# Patient Record
Sex: Male | Born: 1955
Health system: Southern US, Community
[De-identification: ages and names within clinical notes are randomized; demographics above are authoritative.]

## PROBLEM LIST (undated history)

## (undated) DIAGNOSIS — I2510 Atherosclerotic heart disease of native coronary artery without angina pectoris: Secondary | ICD-10-CM

## (undated) DIAGNOSIS — I10 Essential (primary) hypertension: Secondary | ICD-10-CM

## (undated) DIAGNOSIS — I252 Old myocardial infarction: Secondary | ICD-10-CM

## (undated) DIAGNOSIS — E119 Type 2 diabetes mellitus without complications: Secondary | ICD-10-CM

## (undated) DIAGNOSIS — F32A Depression, unspecified: Secondary | ICD-10-CM

## (undated) DIAGNOSIS — I219 Acute myocardial infarction, unspecified: Secondary | ICD-10-CM

## (undated) DIAGNOSIS — F1020 Alcohol dependence, uncomplicated: Secondary | ICD-10-CM

## (undated) DIAGNOSIS — F419 Anxiety disorder, unspecified: Secondary | ICD-10-CM

## (undated) DIAGNOSIS — M1990 Unspecified osteoarthritis, unspecified site: Secondary | ICD-10-CM

## (undated) DIAGNOSIS — G4700 Insomnia, unspecified: Secondary | ICD-10-CM

## (undated) DIAGNOSIS — F329 Major depressive disorder, single episode, unspecified: Secondary | ICD-10-CM

## (undated) DIAGNOSIS — E785 Hyperlipidemia, unspecified: Secondary | ICD-10-CM

## (undated) DIAGNOSIS — Q240 Dextrocardia: Secondary | ICD-10-CM

## (undated) DIAGNOSIS — M5126 Other intervertebral disc displacement, lumbar region: Secondary | ICD-10-CM

## (undated) HISTORY — DX: Type 2 diabetes mellitus without complications: E11.9

## (undated) HISTORY — DX: Anxiety disorder, unspecified: F41.9

## (undated) HISTORY — DX: Acute myocardial infarction, unspecified: I21.9

## (undated) HISTORY — DX: Depression, unspecified: F32.A

## (undated) HISTORY — DX: Atherosclerotic heart disease of native coronary artery without angina pectoris: I25.10

## (undated) HISTORY — DX: Major depressive disorder, single episode, unspecified: F32.9

## (undated) HISTORY — DX: Insomnia, unspecified: G47.00

## (undated) HISTORY — DX: Unspecified osteoarthritis, unspecified site: M19.90

## (undated) HISTORY — DX: Old myocardial infarction: I25.2

## (undated) HISTORY — DX: Alcohol dependence, uncomplicated: F10.20

## (undated) HISTORY — DX: Dextrocardia: Q24.0

## (undated) HISTORY — DX: Essential (primary) hypertension: I10

## (undated) HISTORY — DX: Hyperlipidemia, unspecified: E78.5

---
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
## 2002-11-28 DIAGNOSIS — I252 Old myocardial infarction: Secondary | ICD-10-CM

## 2002-11-28 DIAGNOSIS — I219 Acute myocardial infarction, unspecified: Secondary | ICD-10-CM

## 2002-11-28 DIAGNOSIS — I2510 Atherosclerotic heart disease of native coronary artery without angina pectoris: Secondary | ICD-10-CM

## 2002-11-28 HISTORY — DX: Atherosclerotic heart disease of native coronary artery without angina pectoris: I25.10

## 2002-11-28 HISTORY — DX: Acute myocardial infarction, unspecified: I21.9

## 2002-11-28 HISTORY — DX: Old myocardial infarction: I25.2

## 2002-12-13 ENCOUNTER — Encounter: Payer: Self-pay | Admitting: Emergency Medicine

## 2002-12-13 ENCOUNTER — Inpatient Hospital Stay (HOSPITAL_COMMUNITY): Admission: EM | Admit: 2002-12-13 | Discharge: 2002-12-17 | Payer: Self-pay | Admitting: Cardiology

## 2002-12-16 ENCOUNTER — Encounter: Payer: Self-pay | Admitting: Cardiovascular Disease

## 2005-04-15 ENCOUNTER — Ambulatory Visit: Payer: Self-pay | Admitting: *Deleted

## 2005-04-27 ENCOUNTER — Encounter (HOSPITAL_COMMUNITY): Admission: RE | Admit: 2005-04-27 | Discharge: 2005-05-27 | Payer: Self-pay | Admitting: *Deleted

## 2005-04-27 ENCOUNTER — Ambulatory Visit: Payer: Self-pay | Admitting: *Deleted

## 2005-05-05 ENCOUNTER — Ambulatory Visit: Payer: Self-pay | Admitting: *Deleted

## 2006-11-24 ENCOUNTER — Emergency Department (HOSPITAL_COMMUNITY): Admission: EM | Admit: 2006-11-24 | Discharge: 2006-11-24 | Payer: Self-pay | Admitting: Emergency Medicine

## 2006-11-28 HISTORY — PX: CORONARY ANGIOPLASTY WITH STENT PLACEMENT: SHX49

## 2007-09-17 ENCOUNTER — Inpatient Hospital Stay (HOSPITAL_COMMUNITY): Admission: EM | Admit: 2007-09-17 | Discharge: 2007-09-19 | Payer: Self-pay | Admitting: Emergency Medicine

## 2007-09-17 ENCOUNTER — Ambulatory Visit: Payer: Self-pay | Admitting: Cardiology

## 2007-10-10 ENCOUNTER — Ambulatory Visit: Payer: Self-pay | Admitting: Cardiology

## 2007-10-10 ENCOUNTER — Inpatient Hospital Stay (HOSPITAL_COMMUNITY): Admission: EM | Admit: 2007-10-10 | Discharge: 2007-10-12 | Payer: Self-pay | Admitting: *Deleted

## 2007-10-17 ENCOUNTER — Ambulatory Visit: Payer: Self-pay | Admitting: Cardiology

## 2007-11-09 ENCOUNTER — Ambulatory Visit: Payer: Self-pay | Admitting: Cardiology

## 2008-01-31 ENCOUNTER — Ambulatory Visit: Payer: Self-pay | Admitting: Cardiology

## 2008-04-16 ENCOUNTER — Emergency Department (HOSPITAL_COMMUNITY): Admission: EM | Admit: 2008-04-16 | Discharge: 2008-04-16 | Payer: Self-pay | Admitting: Emergency Medicine

## 2008-05-06 ENCOUNTER — Ambulatory Visit: Payer: Self-pay | Admitting: Cardiology

## 2008-06-03 ENCOUNTER — Ambulatory Visit: Payer: Self-pay | Admitting: Cardiology

## 2008-10-07 ENCOUNTER — Ambulatory Visit: Payer: Self-pay | Admitting: Cardiology

## 2009-04-07 ENCOUNTER — Ambulatory Visit: Payer: Self-pay | Admitting: Cardiology

## 2009-06-17 DIAGNOSIS — I10 Essential (primary) hypertension: Secondary | ICD-10-CM | POA: Insufficient documentation

## 2009-06-17 DIAGNOSIS — E119 Type 2 diabetes mellitus without complications: Secondary | ICD-10-CM | POA: Insufficient documentation

## 2009-06-17 DIAGNOSIS — F411 Generalized anxiety disorder: Secondary | ICD-10-CM | POA: Insufficient documentation

## 2009-06-17 DIAGNOSIS — I2510 Atherosclerotic heart disease of native coronary artery without angina pectoris: Secondary | ICD-10-CM | POA: Insufficient documentation

## 2009-06-17 DIAGNOSIS — Q248 Other specified congenital malformations of heart: Secondary | ICD-10-CM | POA: Insufficient documentation

## 2009-06-25 ENCOUNTER — Ambulatory Visit: Payer: Self-pay | Admitting: Cardiology

## 2009-10-08 ENCOUNTER — Encounter (INDEPENDENT_AMBULATORY_CARE_PROVIDER_SITE_OTHER): Payer: Self-pay | Admitting: *Deleted

## 2009-10-08 ENCOUNTER — Ambulatory Visit: Payer: Self-pay | Admitting: Cardiology

## 2009-10-08 ENCOUNTER — Encounter: Payer: Self-pay | Admitting: Cardiology

## 2009-10-08 LAB — CONVERTED CEMR LAB
Hemoglobin, Urine: NEGATIVE
Nitrite: NEGATIVE
Platelets: 209 10*3/uL
Triglycerides: 118 mg/dL
Urine Glucose: NEGATIVE mg/dL
WBC: 5.4 10*3/uL

## 2009-10-16 ENCOUNTER — Telehealth (INDEPENDENT_AMBULATORY_CARE_PROVIDER_SITE_OTHER): Payer: Self-pay

## 2009-10-19 ENCOUNTER — Telehealth: Payer: Self-pay | Admitting: Cardiology

## 2009-10-30 ENCOUNTER — Encounter (INDEPENDENT_AMBULATORY_CARE_PROVIDER_SITE_OTHER): Payer: Self-pay | Admitting: *Deleted

## 2009-11-02 ENCOUNTER — Telehealth: Payer: Self-pay | Admitting: Cardiology

## 2009-12-07 ENCOUNTER — Encounter (INDEPENDENT_AMBULATORY_CARE_PROVIDER_SITE_OTHER): Payer: Self-pay | Admitting: *Deleted

## 2009-12-07 LAB — CONVERTED CEMR LAB
AST: 15 units/L
Albumin: 4.5 g/dL
Alkaline Phosphatase: 40 units/L
Basophils Absolute: 0 10*3/uL
Basophils Relative: 0 %
Calcium: 9.5 mg/dL
Chloride: 104 meq/L
Cholesterol: 126 mg/dL
Glucose, Bld: 122 mg/dL
HDL: 42 mg/dL
LDL Cholesterol: 59 mg/dL
Lymphocytes Relative: 23 %
Lymphs Abs: 1.4 10*3/uL
MCV: 94.2 fL
Potassium: 4.9 meq/L
RBC: 4.99 M/uL
RDW: 12.3 %
TSH: 2.364 microintl units/mL
Triglycerides: 126 mg/dL

## 2010-02-23 ENCOUNTER — Encounter (INDEPENDENT_AMBULATORY_CARE_PROVIDER_SITE_OTHER): Payer: Self-pay | Admitting: *Deleted

## 2010-07-27 ENCOUNTER — Encounter (INDEPENDENT_AMBULATORY_CARE_PROVIDER_SITE_OTHER): Payer: Self-pay | Admitting: *Deleted

## 2010-07-29 ENCOUNTER — Encounter (INDEPENDENT_AMBULATORY_CARE_PROVIDER_SITE_OTHER): Payer: Self-pay | Admitting: *Deleted

## 2010-11-08 ENCOUNTER — Ambulatory Visit: Payer: Self-pay | Admitting: Cardiology

## 2010-11-08 ENCOUNTER — Encounter (INDEPENDENT_AMBULATORY_CARE_PROVIDER_SITE_OTHER): Payer: Self-pay | Admitting: *Deleted

## 2010-11-08 DIAGNOSIS — E7800 Pure hypercholesterolemia, unspecified: Secondary | ICD-10-CM | POA: Insufficient documentation

## 2010-11-09 ENCOUNTER — Encounter: Payer: Self-pay | Admitting: Adult Health

## 2010-11-10 ENCOUNTER — Telehealth (INDEPENDENT_AMBULATORY_CARE_PROVIDER_SITE_OTHER): Payer: Self-pay | Admitting: *Deleted

## 2010-11-17 ENCOUNTER — Encounter (HOSPITAL_COMMUNITY)
Admission: RE | Admit: 2010-11-17 | Discharge: 2010-12-17 | Payer: Self-pay | Source: Home / Self Care | Attending: Cardiology | Admitting: Cardiology

## 2010-11-17 ENCOUNTER — Ambulatory Visit: Payer: Self-pay | Admitting: Cardiology

## 2010-11-23 ENCOUNTER — Ambulatory Visit: Payer: Self-pay | Admitting: Cardiology

## 2010-12-28 NOTE — Letter (Signed)
Summary: Appointment - Missed  [facility]  [address], [place] [address]   Phone: [phone]  Fax: [phone]     July 29, 2010 MRN: [id number]   [name] [address] [place], [place]  [address]   Dear [name],  Our records indicate you missed your appointment on           07/29/10             with Dr.       .                ROTHBART                    It is very important that we reach you to reschedule this appointment. We look forward to participating in your health care needs. Please contact us at the number listed above at your earliest convenience to reschedule this appointment.     Sincerely,    Glass blower/designer

## 2010-12-28 NOTE — Miscellaneous (Signed)
  Clinical Lists Changes  Observations: Added new observation of RS STUDY: TRACER - study completion 01/12/10 (02/23/2010 11:23)      Research Study Name: TRACER - study completion 01/12/10

## 2010-12-28 NOTE — Miscellaneous (Signed)
Summary: cbc,lipids  Clinical Lists Changes  Observations: Added new observation of WBC UR: neg (10/08/2009 10:59) Added new observation of NITRITE URN: neg (10/08/2009 10:59) Added new observation of PROTEIN UR: neg (10/08/2009 10:59) Added new observation of BLOOD UR: neg (10/08/2009 10:59) Added new observation of GLUCOSE UA: neg (10/08/2009 10:59) Added new observation of PH URINE: 6.0  (10/08/2009 10:59) Added new observation of SPEC GR URIN: 1.015  (10/08/2009 10:59) Added new observation of LDL: 56 mg/dL (16/08/9603 54:09) Added new observation of HDL: 54 mg/dL (81/19/1478 29:56) Added new observation of TRIGLYC TOT: 118 mg/dL (21/30/8657 84:69) Added new observation of CHOLESTEROL: 134 mg/dL (62/95/2841 32:44) Added new observation of PLATELETK/UL: 209 K/uL (10/08/2009 10:59) Added new observation of HCT: 50 % (10/08/2009 10:59) Added new observation of HGB: 16.9 g/dL (11/30/7251 66:44) Added new observation of WBC COUNT: 5.4 10*3/microliter (10/08/2009 10:59)

## 2010-12-28 NOTE — Miscellaneous (Signed)
Summary: LABS CBCD,CMP,LIPIDS,TSH,A1C,12/07/2009  Clinical Lists Changes  Observations: Added new observation of CALCIUM: 9.5 mg/dL (16/08/9603 54:09) Added new observation of ALBUMIN: 4.5 g/dL (81/19/1478 29:56) Added new observation of PROTEIN, TOT: 7.2 g/dL (21/30/8657 84:69) Added new observation of SGPT (ALT): 19 units/L (12/07/2009 16:19) Added new observation of SGOT (AST): 15 units/L (12/07/2009 16:19) Added new observation of ALK PHOS: 40 units/L (12/07/2009 16:19) Added new observation of CREATININE: 0.96 mg/dL (62/95/2841 32:44) Added new observation of BUN: 16 mg/dL (11/30/7251 66:44) Added new observation of BG RANDOM: 122 mg/dL (03/47/4259 56:38) Added new observation of CO2 PLSM/SER: 23 meq/L (12/07/2009 16:19) Added new observation of CL SERUM: 104 meq/L (12/07/2009 16:19) Added new observation of K SERUM: 4.9 meq/L (12/07/2009 16:19) Added new observation of NA: 140 meq/L (12/07/2009 16:19) Added new observation of LDL: 59 mg/dL (75/64/3329 51:88) Added new observation of HDL: 42 mg/dL (41/66/0630 16:01) Added new observation of TRIGLYC TOT: 126 mg/dL (09/32/3557 32:20) Added new observation of CHOLESTEROL: 126 mg/dL (25/42/7062 37:62) Added new observation of TSH: 2.364 microintl units/mL (12/07/2009 16:19) Added new observation of HGBA1C: 6.2 % (12/07/2009 16:19) Added new observation of ABSOLUTE BAS: 0.0 K/uL (12/07/2009 16:19) Added new observation of BASOPHIL %: 0 % (12/07/2009 16:19) Added new observation of EOS ABSLT: 0.1 K/uL (12/07/2009 16:19) Added new observation of % EOS AUTO: 1 % (12/07/2009 16:19) Added new observation of ABSOLUTE MON: 0.6 K/uL (12/07/2009 16:19) Added new observation of MONOCYTE %: 10 % (12/07/2009 16:19) Added new observation of ABS LYMPHOCY: 1.4 K/uL (12/07/2009 16:19) Added new observation of LYMPHS %: 23 % (12/07/2009 16:19) Added new observation of PLATELETK/UL: 219 K/uL (12/07/2009 16:19) Added new observation of RDW: 12.3 %  (12/07/2009 16:19) Added new observation of MCHC RBC: 33.6 g/dL (83/15/1761 60:73) Added new observation of MCV: 94.2 fL (12/07/2009 16:19) Added new observation of HCT: 47.0 % (12/07/2009 16:19) Added new observation of HGB: 15.8 g/dL (71/04/2693 85:46) Added new observation of RBC M/UL: 4.99 M/uL (12/07/2009 16:19) Added new observation of WBC COUNT: 6.0 10*3/microliter (12/07/2009 16:19)

## 2010-12-29 ENCOUNTER — Encounter (INDEPENDENT_AMBULATORY_CARE_PROVIDER_SITE_OTHER): Payer: Self-pay | Admitting: *Deleted

## 2010-12-30 NOTE — Progress Notes (Signed)
Summary: PT NEEDS NEW RXS CALLED IN IF POSSIBLE  Phone Note Call from Patient Call back at [phone]   Caller: PT Reason for Call: Refill Medication, Talk to Nurse Summary of Call: PT CAN NOT AFFORD CEALIS AND WOULD LIKE LAVETRA 20MG  INSTEAD. PLUS HE IS ON PRAVASTATIN 80MG  AND WAS TOLD IF WE CALLED IT IN FOR 40MG  WITH TWICE THE PILLS IT WOULD BE CHEAPER ALSO. HE USES [facility] IN [place]. Initial call taken by: Faythe Ghee,  November 10, 2010 10:14 AM  Follow-up for Phone Call        We can call in the pravachol as it works best for him. Ok to write for one and ONLY one Rx for levitra. He will need to seek urologist for more refills. Follow-up by: Joni Reining NP    New/Updated Medications: PRAVASTATIN SODIUM 40 MG TABS (PRAVASTATIN SODIUM) Take two  tablets  by mouth daily at bedtime LEVITRA 10 MG TABS (VARDENAFIL HCL) take 1 tablet prior to sexual activity Prescriptions: LEVITRA 10 MG TABS (VARDENAFIL HCL) take 1 tablet prior to sexual activity  #3 x 0   Entered by:   Teressa Lower RN   Authorized by:   Joni Reining, NP   Signed by:   Teressa Lower RN on 11/12/2010   Method used:   Electronically to        [facility]* (retail)       [address] [place], [place]  [address]       Ph: [phone] or [phone]       Fax: [phone]   RxID:   [id number] PRAVASTATIN SODIUM 40 MG TABS (PRAVASTATIN SODIUM) Take two  tablets  by mouth daily at bedtime  #60 x 3   Entered by:   Teressa Lower RN   Authorized by:   Joni Reining, NP   Signed by:   Teressa Lower RN on 11/12/2010   Method used:   Electronically to        [facility]* (retail)       [address] [place], [place]  [address]       Ph: [phone] or [phone]       Fax: [phone]   RxID:   [id number]

## 2010-12-30 NOTE — Assessment & Plan Note (Signed)
Summary: ROV POST STRESS TEST   Visit Type:  Follow-up Primary Provider:  rchd  CC:  no cardiology complaints.  History of Present Illness: [name] is a 55 y/o CM we are following for continued assessment and treatment of CAD and other cardiovascular risk factors. He was scheduled for stress myoview secondary to complaints of increasing DOE. Since he has history of LAD and RCA stents he was reassesssed especially with ongoing tobacco abuse.  He denies any chest discomfort.  He was advised to stop lipitor on last visit for one week to assess if his myalgias were being caused by this.  He is anxious about his ED and was started on Viagra by Dr. Dietrich Pates. He wishes to try others as this medication has not been helpful to him.  Current Medications (verified): 1)  Glipizide 10 Mg Xr24h-Tab (Glipizide) .... Take 1 Tab Two Times A Day 2)  Aspirin 325 Mg Tabs (Aspirin) .... Take 1 Tab Daily 3)  Plavix 75 Mg Tabs (Clopidogrel Bisulfate) .... Take 1 Tab Daily 4)  Metformin Hcl 1000 Mg Tabs (Metformin Hcl) .... Take 1tab Two Times A Day 5)  Accupril 20 Mg Tabs (Quinapril Hcl) .... Take 1 Tab Two Times A Day 6)  Pravastatin Sodium 40 Mg Tabs (Pravastatin Sodium) .... Take Two  Tablets  By Mouth Daily At Bedtime 7)  Toprol Xl 50 Mg Xr24h-Tab (Metoprolol Succinate) .... Take 1 Tab Daily 8)  Prozac 40 Mg Caps (Fluoxetine Hcl) .... Take 1 Tab Daily 9)  Xanax 1 Mg Tabs (Alprazolam) .... Take 1 Tab Three Times A Day 10)  Nitrostat 0.4 Mg Subl (Nitroglycerin) .Marland Kitchen.. 1 Tablet Under Tongue At Onset of Chest Pain; You May Repeat Every 5 Minutes For Up To 3 Doses. 11)  Levitra 10 Mg Tabs (Vardenafil Hcl) .... Take 1 Tablet Prior To Sexual Activity  Allergies (verified): 1)  ! Penicillin  Comments:  Nurse/Medical Assistant: [facility] [place] hasn't got pravastatin due to he states it wasn't called in.  Review of Systems       All other systems have been reviewed and are negative  unless stated above.   Vital Signs:  Patient profile:   55 year old male Weight:      202 pounds O2 Sat:      90 % on Room air Pulse rate:   56 / minute BP sitting:   122 / 71  (right arm)  Vitals Entered By: Dreama Saa, CNA (November 23, 2010 1:11 PM)  O2 Flow:  Room air  Physical Exam  General:  Well developed, well nourished, in no acute distress. Lungs:  Clear bilaterally to auscultation and percussion. Heart:  Non-displaced PMI, chest non-tender; regular rate and rhythm, S1, S2 without murmurs, rubs or gallops. Carotid upstroke normal, no bruit. pedals normal pulses. No edema, no varicosities. Abdomen:  Obese 2+ bowel sounds. Msk:  Back normal, normal gait. Muscle strength and tone normal. Pulses:  pulses normal in all 4 extremities Extremities:  No clubbing or cyanosis. Neurologic:  Alert and oriented x 3. Psych:  Normal affect.   Impression & Recommendations:  Problem # 1:  CAD (ICD-414.00) Review of stress test demonstrates abnormal exercise myoview revealing somewhat impaired exercise capacity. No significant stress induced EKG abnormalities, and mild left ventricular dilitation and dysfunction in segmental pattern.  Some periinfart ischemia.  Poor exercise tolerance.  He will be continued to be managed medically.  I have advised smoking cessation. His updated medication list for this  problem includes:    Aspirin 325 Mg Tabs (Aspirin) .Marland Kitchen... Take 1 tab daily    Plavix 75 Mg Tabs (Clopidogrel bisulfate) .Marland Kitchen... Take 1 tab daily    Accupril 20 Mg Tabs (Quinapril hcl) .Marland Kitchen... Take 1 tab two times a day    Toprol Xl 50 Mg Xr24h-tab (Metoprolol succinate) .Marland Kitchen... Take 1 tab daily    Nitrostat 0.4 Mg Subl (Nitroglycerin) .Marland Kitchen... 1 tablet under tongue at onset of chest pain; you may repeat every 5 minutes for up to 3 doses.  Problem # 2:  HYPERCHOLESTEROLEMIA (ICD-272.0) Will have him start pravachol to assess for his tolerance to this.  We will follow-up in 3 months for repeat  cholesterol levels,  He is to call if he is unable to tolerate meds. His updated medication list for this problem includes:    Pravastatin Sodium 40 Mg Tabs (Pravastatin sodium) .Marland Kitchen... Take two  tablets  by mouth daily at bedtime  Problem # 3:  HYPERTENSION (ICD-401.9) Well controlled. His updated medication list for this problem includes:    Aspirin 325 Mg Tabs (Aspirin) .Marland Kitchen... Take 1 tab daily    Accupril 20 Mg Tabs (Quinapril hcl) .Marland Kitchen... Take 1 tab two times a day    Toprol Xl 50 Mg Xr24h-tab (Metoprolol succinate) .Marland Kitchen... Take 1 tab daily  Other Orders: Misc. Referral (Misc. Ref)  Patient Instructions: 1)  Your physician recommends that you schedule a follow-up appointment in: 6 MONTHS 2)  You have been referred to UROLOGIST FOR ERECTILE DYSFUNCTION [phone] Prescriptions: LEVITRA 10 MG TABS (VARDENAFIL HCL) take 1 tablet prior to sexual activity  #3 x 0   Entered by:   Teressa Lower RN   Authorized by:   Joni Reining, NP   Signed by:   Teressa Lower RN on 11/23/2010   Method used:   Electronically to        [facility] (retail)       [address]       [place], [place]  [address]       Ph: [phone]       Fax: [phone]   RxID:   [id number] PRAVASTATIN SODIUM 40 MG TABS (PRAVASTATIN SODIUM) Take two  tablets  by mouth daily at bedtime  #60 x 3   Entered by:   Teressa Lower RN   Authorized by:   Joni Reining, NP   Signed by:   Teressa Lower RN on 11/23/2010   Method used:   Electronically to        [facility]* (retail)       [address]       [place], [place]  [address]       Ph: [phone]       Fax: [phone]   RxID:   [id number]

## 2010-12-30 NOTE — Assessment & Plan Note (Signed)
Summary: F1Y   Visit Type:  Follow-up Primary Provider:  rchd  CC:  some chest pain and sob occasionally.  History of Present Illness: Mr. Fassnacht is a 55 y/o CM with known history ofCAD, s/p cath demonstrating 11/08 continued patency of both the LAD (left anterior descending) and  RCA (right coronary artery) stents. Distal 50% stenosis that does not appear to be critical, hypertension, hypercholesterolemia and diabetes.  He is without complaint of chest pain, but is having more DOE and fatigue. He states that he is becoming intolerant of the liptior causing him to have muscle aches and pain.  He was changed from simvistatin to this a year ago.  He continues issues with ED and states that Viagra is not as effective as cialis, although he has been on viagra for months. He has a history of dextracardia.  Current Medications (verified): 1)  Glipizide 10 Mg Xr24h-Tab (Glipizide) .... Take 1 Tab Two Times A Day 2)  Aspirin 325 Mg Tabs (Aspirin) .... Take 1 Tab Daily 3)  Plavix 75 Mg Tabs (Clopidogrel Bisulfate) .... Take 1 Tab Daily 4)  Metformin Hcl 1000 Mg Tabs (Metformin Hcl) .... Take 1tab Two Times A Day 5)  Accupril 20 Mg Tabs (Quinapril Hcl) .... Take 1 Tab Two Times A Day 6)  Pravastatin Sodium 80 Mg Tabs (Pravastatin Sodium) .... Take One Tablet By Mouth Daily At Bedtime 7)  Toprol Xl 50 Mg Xr24h-Tab (Metoprolol Succinate) .... Take 1 Tab Daily 8)  Prozac 40 Mg Caps (Fluoxetine Hcl) .... Take 1 Tab Daily 9)  Xanax 1 Mg Tabs (Alprazolam) .... Take 1 Tab Three Times A Day 10)  Nitrostat 0.4 Mg Subl (Nitroglycerin) .Marland Kitchen.. 1 Tablet Under Tongue At Onset of Chest Pain; You May Repeat Every 5 Minutes For Up To 3 Doses. 11)  Cialis 10 Mg Tabs (Tadalafil) .... Take 1 Tablet 30 Minutes Prior To Sexual Activity  Allergies (verified): 1)  ! Penicillin  Comments:  Nurse/Medical Assistant: patient and i reviewed meds from ov in 2010 stated these meds are correct [facility] [place]  Past  History:  Past medical, surgical, family and social histories (including risk factors) reviewed, and no changes noted (except as noted below).  Past Medical History: Reviewed history from 06/17/2009 and no changes required. Current Problems:  ANXIETY DISORDER (ICD-300.00) CAD (ICD-414.00) DEXTROCARDIA (ICD-746.87) DM (ICD-250.00) HYPERTENSION (ICD-401.9)  Past Surgical History: Reviewed history from 06/17/2009 and no changes required. cath  Family History: Reviewed history from 06/17/2009 and no changes required. Father: Mother:  Social History: Reviewed history from 06/17/2009 and no changes required. Full Time Divorced  Tobacco Use - Yes.  Alcohol Use - yes Regular Exercise - yes Drug Use - yes(former cocaine and marijuana)  Review of Systems       DOE, ED, myalgias All other systems have been reviewed and are negative unless stated above.   Vital Signs:  Patient profile:   55 year old male Height:      71 inches Weight:      201 pounds BMI:     28.14 O2 Sat:      95 % on Room air Pulse rate:   54 / minute BP sitting:   131 / 73  (left arm)  Vitals Entered By: Dreama Saa, CNA (November 08, 2010 2:50 PM)  O2 Flow:  Room air  Physical Exam  General:  Well developed, well nourished, in no acute distress. Head:  normocephalic and atraumatic Eyes:  PERRLA/EOM intact; conjunctiva and lids normal. Neck:  Neck supple, no JVD. No masses, thyromegaly or abnormal cervical nodes. Lungs:  Clear bilaterally to auscultation and percussion, some LLL crackes. Heart:  Non-displaced PMI, chest non-tender; regular rate and rhythm, S1, S2 without murmurs, rubs or gallops. Carotid upstroke normal, no bruit. Normal abdominal aortic size, no bruits. Femorals normal pulses, no bruits. Pedals normal pulses. No edema, no varicosities. Abdomen:  Bowel sounds positive; abdomen soft and non-tender without masses, organomegaly, or hernias noted. No hepatosplenomegaly. Msk:  Myagias  but no joint tenderness or weakness Pulses:  pulses normal in all 4 extremities Extremities:  No clubbing or cyanosis. Psych:  Normal affect.   EKG  Procedure date:  11/08/2010  Findings:      Sinus bradycardia with rate of:  52 bpm  Comments:      Evidence of remote anterior MI.    Impression & Recommendations:  Problem # 1:  CAD (ICD-414.00) Will plan lexiscan stress myoview as he is having some DOE.  He states he has some exertional chest pain, but not as severe as in the past and only last a few seconds.      Aspirin 325 Mg Tabs (Aspirin) .Marland Kitchen... Take 1 tab daily    Plavix 75 Mg Tabs (Clopidogrel bisulfate) .Marland Kitchen... Take 1 tab daily    Accupril 20 Mg Tabs (Quinapril hcl) .Marland Kitchen... Take 1 tab two times a day    Toprol Xl 50 Mg Xr24h-tab (Metoprolol succinate) .Marland Kitchen... Take 1 tab daily    Nitrostat 0.4 Mg Subl (Nitroglycerin) .Marland Kitchen... 1 tablet under tongue at onset of chest pain; you may repeat every 5 minutes for up to 3 doses.  Orders: Nuclear Stress Test (Nuc Stress Test)  Problem # 2:  HYPERTENSION (ICD-401.9) Assessment: Unchanged Well controlled on current medications. His updated medication list for this problem includes:    Aspirin 325 Mg Tabs (Aspirin) .Marland Kitchen... Take 1 tab daily    Accupril 20 Mg Tabs (Quinapril hcl) .Marland Kitchen... Take 1 tab two times a day    Toprol Xl 50 Mg Xr24h-tab (Metoprolol succinate) .Marland Kitchen... Take 1 tab daily  Orders: Nuclear Stress Test (Nuc Stress Test)  Problem # 3:  HYPERCHOLESTEROLEMIA (ICD-272.0) He is complaining of myalgia's on current dose of lipitor.  I have asked him to stop the lipitor for one week to ascertain if symptoms have resolved if so, he is to start pravachol 80 mg to evaluate if this too  causes his symptoms. If his symptoms to do resolve with liptor holiday, he is to continue it.  This may be diabetic neuralgia as well. His updated medication list for this problem includes:    Pravastatin Sodium 80 Mg Tabs (Pravastatin sodium) .Marland Kitchen... Take one  tablet by mouth daily at bedtime  Patient Instructions: 1)  Your physician recommends that you schedule a follow-up appointment in: after tests 2)  Your physician has recommended you make the following change in your medication: stop Viagra, Begin Cialis. Stop Lipitor, begin Pravastatin 80mg  every day, use Nitroglycerin as needed for chest pain 3)  Your physician has requested that you have an lexiscan myoview.  For further information please visit [email or web address]  Please follow instruction sheet, as given. Prescriptions: PRAVASTATIN SODIUM 80 MG TABS (PRAVASTATIN SODIUM) Take one tablet by mouth daily at bedtime  #30 x 3   Entered by:   Fuller Plan CMA   Authorized by:   Joni Reining, NP   Signed by:   Fuller Plan CMA on 11/08/2010   Method used:   Electronically to  [facility] (retail)       [address]       [place], [place]  [address]       Ph: [phone]       Fax: [phone]   RxID:   [id number] CIALIS 10 MG TABS (TADALAFIL) take 1 tablet 30 minutes prior to sexual activity  #3 x 2   Entered by:   Fuller Plan CMA   Authorized by:   Joni Reining, NP   Signed by:   Fuller Plan CMA on 11/08/2010   Method used:   Electronically to        [facility] [address] [facility] (retail)       [address]       [place], [place]  [address]       Ph: [phone]       Fax: [phone]   RxID:   [id number] NITROSTAT 0.4 MG SUBL (NITROGLYCERIN) 1 tablet under tongue at onset of chest pain; you may repeat every 5 minutes for up to 3 doses.  #25 x 2   Entered by:   Fuller Plan CMA   Authorized by:   Joni Reining, NP   Signed by:   Fuller Plan CMA on 11/08/2010   Method used:   Electronically to        [facility]* (retail)       [address] [place], [place]  [address]       Ph: [phone] or [phone]       Fax: [phone]   RxID:   [id number]   Appended Document: F1Y  Reviewed Juanito Doom, MD

## 2010-12-30 NOTE — Letter (Signed)
Summary: [place] Treadmill (Nuc Med Stress)  [facility]  [address], [place] [address]   Phone: [phone]  Fax: [phone]    Nuclear Medicine 1-Day Stress Test Information Sheet  Re:     [name]   DOB:     07/02/1956 MRN:     [id number] Weight:  Appointment Date: Register at: Appointment Time: Referring MD:  ___Exercise Stress  __Adenosine   __Dobutamine  _x_Lexiscan  __Persantine   __Thallium  Urgency: ____1 (next day)   ____2 (one week)    ____3 (PRN)  Patient will receive Follow Up call with results: Patient needs follow-up appointment:  Instructions regarding medication:  How to prepare for your stress test: 1. Nothing to eat or drink after midnight. 2. DO NOT use any tobacco products for at leaset 8 hours prior to arrival. 3. DO NOT wear dresses or any clothing that may have metal clasps or buttons. 4. Wear short sleeve shirts, loose clothing, and comfortalbe walking shoes. 5. DO NOT use lotions, oils or powder on your chest before the test. 6. The test will take approximately 3-4 hours from the time you arrive until completion. 7. To register the day of the test, go to the Short Stay entrance at [facility]. 8. If you must cancel your test, call [phone] as soon as you are aware. 9. Do not take your AM medication the day of the test. After you arrive for test:   When you arrive at [facility], you will go to Short Stay to be registered. They will then send you to Radiology to check in. The Nuclear Medicine Tech will get you and start an IV in your arm or hand. A small amount of a radioactive tracer will then be injected into your IV. This tracer will then have to circulate for 30-45 minutes. During this time you will wait in the waiting room and you will be able to drink something without caffeine. A series of pictures will be taken of your heart follwoing this waiting period. After the 1st set of pictures you will go to  the stress lab to get ready for your stress test. During the stress test, another small amount of a radioactive tracer will be injected through your IV. When the stress test is complete, there is a short rest period while your heart rate and blood pressure will be monitored. When this monitoring period is complete you will have another set of pictrues taken. (The same as the 1st set of pictures). These pictures are taken between 15 minutes and 1 hour after the stress test. The time depends on the type of stress test you had. Your doctor will inform you of your test results within 7 days after test.    The possibilities of certain changes are possible during the test. They include abnormal blood pressure and disorders of the heart. Side effects of persantine or adenosine can include flushing, chest pain, shortness of breath, stomach tightness, headache and light-headedness. These side effects usually do not last long and are self-resolving. Every effort will be made to keep you comfortable and to minimize complications by obtaining a medical history and by close observation during the test. Emergency equipment, medications, and trained personnel are available to deal with any unusual situation which may arise.  Please notify office at least 48 hours in advance if you are unable to keep this appt.

## 2011-01-05 NOTE — Letter (Signed)
Summary: [place] Results Engineer, agricultural at [facility]  [address], [place] [address]   Phone: [phone]  Fax: [phone]      December 29, 2010 MRN: [id number]   [facility] [address] [place], [place]  [address]   Dear Mr. MAURA,  Your test ordered by Selena Batten has been reviewed by your physician (or physician assistant) and was found to be normal or stable. Your physician (or physician assistant) felt no changes were needed at this time.  ____ Echocardiogram  _x___ Cardiac Stress Test  ____ Lab Work  ____ Peripheral vascular study of arms, legs or neck  ____ CT scan or X-ray  ____ Lung or Breathing test  ____ Other:  No change in medical treatment at this time, per Dr. Dietrich Pates.  Thank you, Khaleel Beckom Allyne Gee RN    [place] Bing, MD, Lenise Arena.C.Gaylord Shih, MD, F.A.C.C Lewayne Bunting, MD, F.A.C.C Nona Dell, MD, F.A.C.C Charlton Haws, MD, Lenise Arena.C.C

## 2011-03-26 ENCOUNTER — Emergency Department (HOSPITAL_COMMUNITY)
Admission: EM | Admit: 2011-03-26 | Discharge: 2011-03-26 | Disposition: A | Discharge status: Home / Self Care | Payer: No Typology Code available for payment source | Attending: Emergency Medicine | Admitting: Emergency Medicine

## 2011-03-26 ENCOUNTER — Emergency Department (HOSPITAL_COMMUNITY): Payer: No Typology Code available for payment source

## 2011-03-26 DIAGNOSIS — Q893 Situs inversus: Secondary | ICD-10-CM | POA: Insufficient documentation

## 2011-03-26 DIAGNOSIS — Z7982 Long term (current) use of aspirin: Secondary | ICD-10-CM | POA: Insufficient documentation

## 2011-03-26 DIAGNOSIS — I252 Old myocardial infarction: Secondary | ICD-10-CM | POA: Insufficient documentation

## 2011-03-26 DIAGNOSIS — S4350XA Sprain of unspecified acromioclavicular joint, initial encounter: Secondary | ICD-10-CM | POA: Insufficient documentation

## 2011-03-26 DIAGNOSIS — E119 Type 2 diabetes mellitus without complications: Secondary | ICD-10-CM | POA: Insufficient documentation

## 2011-03-26 DIAGNOSIS — Z79899 Other long term (current) drug therapy: Secondary | ICD-10-CM | POA: Insufficient documentation

## 2011-03-26 DIAGNOSIS — I10 Essential (primary) hypertension: Secondary | ICD-10-CM | POA: Insufficient documentation

## 2011-04-12 NOTE — Letter (Signed)
May 06, 2008    Vertis Kelch  Health Department [facility]  [address]  [place], [place] [address]   RE:  KEIONTE, SWICEGOOD  MRN:  [id number]  /  DOB:  29-May-1956   Dear Ms. Allen:   Mr. Granada returns to the office for continued assessment and treatment of  coronary disease.  Since his last visit, he has done extremely well from  a cardiac standpoint.  He reports no chest discomfort nor dyspnea.  His  lifestyle is relatively sedentary.  He has continuing problems with  anxiety including a recent emergency department visit when he exhausted  his supply of Xanax, which he takes at a dose of 1 mg b.i.d.   His other medications include:  1. Glipizide 20 mg b.i.d.  2. Aspirin 325 mg daily.  3. Clopidogrel 75 mg daily.  4. Metformin 1000 mg b.i.d.  5. Accupril 20 mg daily.  6. Simvastatin 20 mg daily.  7. Metoprolol 100 mg b.i.d.   His major issues have been social.  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  He has not been able to find steady work and  has financial constraints such that he has not been able to see a  primary care doctor for renewal of Xanax.   EXAM:  Depressed appearing but pleasant gentleman.  The weight is 218,  three pounds more than 8 months ago.  Blood pressure 130/80, heart rate  65 and regular, respirations 16.  NECK:  No jugular venous distention; no carotid bruits.  LUNGS:  Minimal inspiratory rhonchi.  CARDIAC:  Normal first and second heart sounds; fourth heart sound  present.  ABDOMEN:  Soft and nontender; no organomegaly.  EXTREMITIES:  No edema; distal pulses intact.   Recent laboratory from the Health Department is excellent with good  control of hyperlipidemia and a normal chemistry profile.   IMPRESSION:  Mr. Wamble is doing well with his current medical therapy.  I  have renewed his prescription for Xanax.  We will attempt to secure  primary care physician for him.  I will reassess this nice gentleman in  9 months.    Sincerely,      Gerrit Friends. Dietrich Pates, MD, [facility]  Electronically Signed    RMR/MedQ  DD: 05/06/2008  DT: 05/06/2008  Job #: [id number]

## 2011-04-12 NOTE — Assessment & Plan Note (Signed)
[facility]                       [facility] OFFICE NOTE   NAME:[name], [name]                       MRN:          [id number]  DATE:06/25/2009                            DOB:          1956/11/09    REFERRING PHYSICIAN:  [facility]   [name] returns to the office for continued assessment and treatment of  his coronary artery disease and cardiovascular risk factors.  Of note is  the fact that [name] has situs inversus.  [name] has done well, walking up to 25  miles per day without difficulty.  [name] never has chest discomfort nor  dyspnea.  [name] has had sublingual nitroglycerin, but not used it.  [name] has  continued to have personal reverses.  His girl friend of 20 years died  suddenly in 2023/03/03.  [name] had a subsequent hospitalization for depression  and anxiety.  [name] now reports erectile dysfunction.   CURRENT MEDICATIONS:  1. Glipizide 10 mg b.i.d.  2. Aspirin 325 mg daily.  3. Clopidogrel 75 mg daily.  4. Metformin 1000 mg b.i.d.  5. Accupril 20 mg b.i.d.  6. Drug provided in conjunction with the TRACER protocol.  7. Simvastatin 20 mg daily.  8. Metoprolol 50 mg daily.  9. Prozac 20 mg daily.  10.Xanax 1 mg q.i.d.   PHYSICAL EXAMINATION:  GENERAL:  Pleasant, tanned gentleman in no acute  distress.  VITAL SIGNS:  The weight is 205, 10 pounds less than last year.  Blood  pressure 130/70, heart rate 70 and regular.  Neck:  No jugular venous distention; no carotid bruits.  LUNGS:  Clear.  CARDIAC:  Normal first and second heart sounds.  ABDOMEN:  Soft and nontender; no organomegaly.  EXTREMITIES:  Bruising and hemorrhage under the nail of the right great  toe; no edema; normal distal pulses.   Lipid profile from February was excellent with total cholesterol of 127,  triglycerides of 149, HDL 30, and LDL of 56.   IMPRESSION:  [name] is doing very well with respect to coronary artery  disease.  Hypertension and  hyperlipidemia are under excellent control.  [name] is very active physically without cardiopulmonary symptoms.  His  medical regime is optimal.  If they are comes in time when clopidogrel  cannot be provided at the low cost, this medication could be dispensed  with.  Otherwise, [name] will continue to follow up in the study protocol in  which [name] is participating and  return to see me in 1 year.  [name] was provided with samples of Cialis and  instructions for use and dose titration.     Gerrit Friends. Dietrich Pates, MD, [facility]  Electronically Signed    RMR/MedQ  DD: 06/25/2009  DT: 06/26/2009  Job #: [id number]   cc:   Health Dept. [facility]

## 2011-04-12 NOTE — H&P (Signed)
[name], [name]                ACCOUNT NO.:  [id number]   MEDICAL RECORD NO.:  [id number]          PATIENT TYPE:  INP   LOCATION:  6527                         FACILITY:  [facility]   PHYSICIAN:  Bettey Mare. [place], NPDATE OF BIRTH:  1955-12-04   DATE OF ADMISSION:  09/17/2007  DATE OF DISCHARGE:                              HISTORY & PHYSICAL   PRIMARY CARDIOLOGIST:  Dr. Gerrit Friends. Dietrich Pates, MD, [facility].   PRIMARY CARE PHYSICIAN:  [facility].   HISTORY OF PRESENT ILLNESS:  A 55 year old Caucasian male with known  CAD, myocardial infarction anteriorly, PCI to the LAD, and hypertension,  with also dextrocardia, who came to the emergency room with complaints  of cramping pain with activity, tightness, burning feeling over the  right side of his chest.  It was unlike the pain he had when he had his  prior anterior MI in 2004, which was related to weakness and shortness  of breath.  The pain occurs when bending over or lifting, or pulling  something with his right arm.  The patient walks approximately 2 miles  every day, and while walking, he noticed some burning and some cramping  pain in his right chest which was new for him.  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  Normally he is nauseated in the morning.  He believes  this is secondary to his medications.  The pain does worsen with  movement.  There is some radiation to the right arm.  Because of pain  occurring with walking, he chose to come to the emergency room to be  evaluated further with his known history.  He also complains of frequent  anxiety.  He is currently pain-free, put pain is reproducible with  movement and sitting up, or pulling of the right arm when sitting up  pulling on a handrail.  He is medically compliant and sees M.D.'s  through the health department regularly for diabetes and hypertension  control.   REVIEW OF  SYSTEMS:  Positive for chest pain, some sweating at night,  depression, anxiety, low grade nausea daily until he gets up and eats  chronically.  The patient also has some right-sided chest cramping and  right shoulder pain on and off for the last couple of months with  worsening prior to admission.   PAST MEDICAL HISTORY:  1. Anterior MI in 2004.      a.     Cardiac catheterization at that time revealed an LAD with       40% proximal stenosis, 100% stenosis in the mid portion, status       post Cypher stent placed per Dr. Randa Evens at that time with       100% to 0% stenosis afterwards.  Circumflex, no critical disease.       Right coronary artery, 50% mid stenosis and 60% distal.      b.     Echocardiogram in 2004 revealed an EF of 55 to 60%.  c.     Stress Myoview in May of 2006 revealing abnormal profusion       scan, moderate fixed defect mid ventricle to the apex with       concomitant akinesis in that area anteriorly.  2. Hypertension.  3. Diabetes.  4. Hyperlipidemia.  5. Anxiety/depression.  6. Dextrocardia.   SOCIAL HISTORY:  The patient lives just [place] of [place] with his  girlfriend.  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  He does not  smoke.  He drinks a 12-pack of beer a month.  Drugs:  Former marijuana,  cocaine user, but not for the last 3 years.  Exercise:  He walks  approximately 2 miles a day.   ALLERGIES:  1. PENICILLIN.  2. BENZODIAZEPINES CAUSE HYPERACTIVITY.   CURRENT MEDICATIONS:  1. Aspirin 81 mg daily.  2. Plavix 75 mg once a day.  3. Simvastatin 20 mg daily.  4. Metoprolol 100 mg daily.  5. Glucotrol 10 mg daily.  6. Metformin 1000 mg b.i.d.  7. Accupril 20 mg daily.   CURRENT LABS:  Sodium 140, potassium 4.5, chloride 106, CO2 29, BUN 13,  creatinine 0.77, glucose 97.  Hemoglobin 14.9, hematocrit 44.3, white  blood cells 8, platelets 209,000.  CK 128, MB 4.9, troponin less than  0.01.  PTT 27, PT 12.3, INR 0.9.  EKG  revealing sinus bradycardia with a  ventricular rate of 59 beats per minute with Q waves anterolateral.  Chest x-ray reveals no acute disease.   PHYSICAL EXAMINATION:  VITAL SIGNS:  Blood pressure 134/79, heart rate  59, respirations 20, temperature 98.2, O2 sat 97% on room air.  HEENT:  Head is normocephalic, atraumatic.  Eyes PERRLA.  Dentition is  fair.  NECK:  Supple.  There is no JVD.  No carotid bruits.  No thyromegaly is  palpated.  CARDIOVASCULAR:  Regular rate and rhythm without murmurs, rubs, or  gallops.  This is auscultated on the right side secondary to  dextrocardia.  LUNGS:  Clear to auscultation without wheezes, rales, or rhonchi.  ABDOMEN:  Obese, nontender, with 2+ bowel sounds.  No rebound or  guarding noted.  EXTREMITIES:  Without clubbing, cyanosis, or edema.  Dorsalis pedis and  radial pulses are 1+ bilaterally.  MUSCULOSKELETAL:  The right arm and right chest does have some pain with  movement or pulling on the side rails.  Otherwise negative.  NEURO:  Cranial nerves II-XII are grossly intact, but the patient is  complaining of tinnitus chronically.   IMPRESSION:  1. Chest pain:  Rule out cardiac etiology versus musculoskeletal.  2. Hypertension:  Well-controlled.  3. Diabetes:  Controlled.  4. Hyperlipidemia.  5. History of anxiety and depression.  6. Dextrocardia.   PLAN:  The patient has been seen and examined by myself and Dr. Simona Huh in the emergency room.  His primary care physician will be Dr.  Dietrich Pates as he was followed by Dr. Dorethea Clan in [place] prior to this  admission.  He is a 55 year old with dextrocardia, coronary artery  disease, status post anterior myocardial infarction in 2004 and drug  eluting stent to the left anterior descending.  He had a Myoview in 2006  with fixed anterior defect and an ejection fraction of 43% and was  treated medically.  He has not followed up since then, and now presents  with progressive right-sided  chest and shoulder pain in the recumbent  position with right arm pain.  Also, today when walking, he  had some  pain in his right chest which he described as cramping.  EKG abnormal  with nonspecific ST-T wave changes.  Dextrocardia is noted.  Point of  care troponins are negative.  We plan to admit, cycle enzymes.  If  negative, then stress Myoview is scheduled for tomorrow as an inpatient.  If abnormal, then would proceed with cath.  This has been discussed with  the patient who verbalizes understanding and is willing to proceed and  be admitted.  More recommendations for hospital course in response to  treatment.      Bettey Mare. Lyman Bishop, NP     KML/MEDQ  D:  09/17/2007  T:  09/18/2007  Job:  [id number]   cc:   [facility] at [facility].

## 2011-04-12 NOTE — Cardiovascular Report (Signed)
[name], [name]                ACCOUNT NO.:  [id number]   MEDICAL RECORD NO.:  [id number]          PATIENT TYPE:  INP   LOCATION:  4705                         FACILITY:  [facility]   PHYSICIAN:  Arturo Morton. Riley Kill, MD, FACCDATE OF BIRTH:  22-Jul-1956   DATE OF PROCEDURE:  10/11/2007  DATE OF DISCHARGE:                            CARDIAC CATHETERIZATION   PROCEDURE:  Cardiac catheterization.   INDICATIONS:  [name] is a 55 year old who presented with chest pain.  He has had recent chest pain.  The current study was done to assess  coronary anatomy.  The patient had a stent placed several weeks ago by  Dr. Juanda Chance.  His troponin on arrival was negative.   His electrocardiogram did not demonstrate ST segment changes.  The  current study was done to assess coronary anatomy.   PROCEDURE:  1. Catheter placement without left heart cath.  2. Coronary arteriography.  3. Interpretation of coronary angiography.   DESCRIPTION OF PROCEDURE:  The patient was brought to the cath lab and  prepped and draped in the usual fashion.  It is notable that the patient  has dextrocardia.  Through a right femoral stick a 6 French sheath was  placed.  We then took views of the left coronary artery.  This was done  partially in reverse.  Following this, a JR-4 guiding catheter with side  holes was utilized.  Intubation had been difficult previously with an  Amplatz catheter.  We were ultimately able to find the right coronary  ostium.  All catheters were subsequently removed and the patient was  taken to the holding area for sheath removal.  There were no major  complications.  He did receive fentanyl and Versed for the procedure.   HEMODYNAMIC DATA:  Central aortic pressure 102/67, mean 82.   ANGIOGRAPHIC DATA:  1. The left main is free of critical disease.  2. The LAD courses to the apex and is previously stented.  The LAD      stent appears be widely patent.  There are some areas of      irregularity  proximal to the stent with 20% narrowing.  The distal      stent is widely patent however with a few diagonal branches.  3. The circumflex provides a large marginal and a posterolateral      system.  The circumflex is free of critical disease.  Some of the      right coronary artery provides a posterior descending and      posterolateral branch.  There is a stent in the mid vessel with no      restenosis.  There is distally about a 50% lesion which was present      previously. It is eccentric and comes off just prior to the PDA      takeoff.  However it does not appear to be acute.   CONCLUSION:  1. Continued patency of both the LAD (left anterior descending) and      RCA (right coronary artery) stents.  2. Distal 50% stenosis that does not appear to be critical.  DISPOSITION:  I have reviewed the films, I would recommend continued  medical therapy.      Arturo Morton. Riley Kill, MD, [facility]  Electronically Signed     TDS/MEDQ  D:  10/11/2007  T:  10/12/2007  Job:  [id number]   cc:   Gerrit Friends. Dietrich Pates, MD, [facility]

## 2011-04-12 NOTE — Cardiovascular Report (Signed)
NAME:  Daniel, Merritt                ACCOUNT NO.:  [id number]   MEDICAL RECORD NO.:  [id number]          PATIENT TYPE:  INP   LOCATION:  6527                         FACILITY:  [facility]   PHYSICIAN:  Bruce R. Juanda Chance, MD, FACCDATE OF BIRTH:  30-Jun-1956   DATE OF PROCEDURE:  09/18/2007  DATE OF DISCHARGE:                            CARDIAC CATHETERIZATION   CLINICAL HISTORY:  [name] is 46 years and has dextrocardia and had a  stent placed by Dr. Samule Ohm in 2004 which was a Cypher stent in the LAD.  He recently developed symptoms of chest pain, both with exertion and  with other activities.  He was admitted to the hospital with a diagnosis  of possible unstable angina.  Markers were negative.   PROCEDURE:  The procedure was performed via right femoral artery and  arterial sheath and 6-French preformed coronary catheters.  A front wall  arterial puncture was performed and Omnipaque contrast was used.  At  completion of diagnostic study, made decision to treat with intervention  on the right coronary artery.  The patient had dextrocardia and we  reversed the images for most of the procedure to facilitate catheter  placement.  We had a great deal of difficulty intubating the right  coronary artery and used multiple catheters and finally were able to  engage it with an AL-1.  When we switched to a guide, we decided to try  a JR-4 6-French guiding catheter with side holes and this worked  satisfactory.  We used a Research scientist (physical sciences) and crossed the lesion without  difficulty.  The patient had been given bivalirudin bolus and infusion  and was reloaded with 600 mg of Plavix and was given chewable aspirin  prior to the procedure.  We predilated the lesion with a 2.5 x 15 mm  Maverick performing one inflation up to 8 atmospheres for 20 seconds.  We then deployed a 2.75 x 18 mm Promus stent deploying this with one  inflation of 14 atmospheres for 20 seconds.  We then postdilated with a  3.25 x 15-mm  Quantum Maverick performing two inflations of 16  atmospheres for 30 seconds. Final diagnostic studies were then performed  through guiding catheter.  Right femoral artery was closed with Angio-  Seal at the end of the procedure.  The patient tolerated the procedure  well and left the laboratory in satisfactory condition.   RESULTS:  The left ventricular pressure was 145/21.  The aortic pressure  was 145/80 with a mean of 109.   LEFT MAIN CORONARY:  Left main coronary is free of disease.   LEFT ANTERIOR DESCENDING ARTERY:  The left anterior descending artery  gave rise to four diagonal branches and two septal perforators.  There  was less than 10% stenosis at the stent site in the proximal LAD.   THE CIRCUMFLEX ARTERY:  The circumflex artery gave rise to a marginal  branch and posterolateral branch.  These vessels were free of  significant disease.   RIGHT CORONARY ARTERY:  The right coronary artery was a moderate-sized  vessel that gave rise to the right ventricular  branch and posterior  descending and posterolateral branch.  There was 90% stenosis in the  midvessel.  This vessel had a high takeoff above the coronary cusp.   THE LEFT VENTRICULOGRAM:  The left ventriculogram performed in the left  RAO projection showed good wall motion with no areas of hypokinesis.  The estimated ejection fraction was 6%.   Following stenting of the lesion, the mid-right coronary stenosis  improved from 90% to 0%.   CONCLUSION:  1. Coronary artery disease status post prior PCI with less than 10%      stenosis at the Cypher stent site in the proximal LAD, no      significant obstruction of circumflex artery, 90 cm stenosis in the      mid-right coronary and normal LV function.  2. Dextrocardia.  3. Successful stenting of the lesion in the mid-right coronary using a      Promus drug-eluting stent with improvement in Fentanyl narrowing      from 90% to 0%.   RECOMMENDATIONS:  The patient to  return to post anesthesia for further  observation.  Will recommend Plavix for at least a year and possibly  long term.      Bruce Elvera Lennox Juanda Chance, MD, [facility]  Electronically Signed     BRB/MEDQ  D:  09/18/2007  T:  09/19/2007  Job:  [id number]   cc:   Daniel Friends. Dietrich Pates, MD, [facility]  Cardiopulmonary

## 2011-04-12 NOTE — Discharge Summary (Signed)
NAMEMOUNIR, SKIPPER                ACCOUNT NO.:  [id number]   MEDICAL RECORD NO.:  [id number]          PATIENT TYPE:  INP   LOCATION:  6527                         FACILITY:  [facility]   PHYSICIAN:  Gerrit Friends. Dietrich Pates, MD, FACCDATE OF BIRTH:  [birth year]   DATE OF ADMISSION:  09/17/2007  DATE OF DISCHARGE:  09/19/2007                         DISCHARGE SUMMARY - REFERRING   SUMMARY OF HISTORY:  Mr. Rollo is a 55 year old male who presented with  dizziness.  He also has a history of cramping chest discomfort  associated with activity and tightness-feeling.  This would occur with  bending over, however, on the day of admission it occurred with walking.  He is usually nauseated in the morning.  His discomfort worsened with  movement.   The patient states he is medically compliant and is followed regularly  at the Health Department.  He has a history of myocardial infarction,  last catheterization at January 2004.  He received a Cypher stent to the  mid-LAD.  Echo in January 2004 showed EF of 55-60%.  Stress  myocardiogram in May 2006 showed a fixed defect.  He has a history of  hypertension, diabetes, dextrocardia, hyperlipidemia, anxiety,  depression and alcohol use.   LABORATORY DATA:  On admission, H&H was 14.9 and 44.3, normal indices,  platelets 209, WBCs 8.  At time of discharge, H&H is 14.2 of 41.7,  normal indices, platelets 178, wbc 6.8.  PTT 27, PT 12.3.  Sodium 140,  potassium 4.5, BUN 13, creatinine 0.77, glucose 97.  At the time of  discharge, sodium was 142, potassium 3.9, BUN 10, creatinine 0.82,  glucose 94.  CK, MB, relative indexes were within normal limits x4.  Fasting lipids showed total cholesterol 120, triglycerides 104, HDL 32,  LDL 67.   Chest x-ray on admission showed situs inversus, no acute findings.   EKGs showed sinus bradycardia, normal axis, delayed R-wave, nonspecific  ST-T wave changes.   HOSPITAL COURSE:  Mr. Tith was admitted to [facility] by Bettey Mare. Lyman Bishop, NP, and Dr. Diona Browner.  He has not been  seen by our practice for some time.  With his typical and atypical  features, Dr. Dietrich Pates felt that a stress test would not resolve the  issues.  Overnight he had ruled out for myocardial infarction and felt  that cardiac catheterization should be performed on September 18, 2007.  This was performed by Dr. Juanda Chance.  He had less than 10% restenosis in  his LAD.  He had a 90% mid lesion in his RCA.  He received a Promus  stent to the RCA, reducing this to 0%.  EF of 60%.  Dr. Juanda Chance noted  dextrocardia and images were normalized.  On September 19, 2007, after  follow-up, Dr. Dietrich Pates felt that the patient could be discharged home.   DISCHARGE DIAGNOSES:  1. Unstable angina pectoris with typical and atypical features.  2. Hyperlipidemia.  3. Dextrocardia.  4. History of diabetes.  5. Hypertension.   PROCEDURES PERFORMED:  Cardiac catheterization with Promus stent to the  mid RCA on September 18, 2007, by Dr.  Brodie.   DISPOSITION:  The patient is discharged home, asked to maintain low-  sodium heart-healthy ADA diet.  Wound care and activities are per  supplemental sheet.  He received a new prescription for sublingual  nitroglycerin as needed as that this was not listed on his preadmission  medications.   DISCHARGE MEDICATIONS:  He was asked to continue  1. Aspirin 81 mg p.o. daily.  2. Plavix 75 mg daily.  3. Simvastatin 20 mg nightly.  4. Glucotrol 10 mg daily.  5. Resume metformin 1000 mg b.i.d. on Friday September 21, 2007.  6. Continue Accupril 20 mg b.i.d.  He was asked to bring all medications to all appointments.   FOLLOW UP:  He will follow up with Dr. Dietrich Pates in approximately one  month on October 17, 2007, at 11:15.   DISCHARGE TIME:  35 minutes.      Joellyn Rued, PA-C      Gerrit Friends. Dietrich Pates, MD, [facility]  Electronically Signed    EW/MEDQ  D:  09/19/2007  T:  09/19/2007  Job:  [id number]   cc:    Health Department [facility]

## 2011-04-12 NOTE — H&P (Signed)
[name], [name]                ACCOUNT NO.:  [id number]   MEDICAL RECORD NO.:  [id number]          PATIENT TYPE:  INP   LOCATION:  4705                         FACILITY:  [facility]   PHYSICIAN:  Luis Abed, MD, FACCDATE OF BIRTH:  May 08, 1956   DATE OF ADMISSION:  10/10/2007  DATE OF DISCHARGE:                              HISTORY & PHYSICAL   PRIMARY CARDIOLOGIST:  Gerrit Friends. Dietrich Pates, MD, [facility].   PRIMARY CARE Kimi Bordeau:  [facility] Department.   The patient is a 55 year old Caucasian male with history of CAD as well  as dextrocardia; who presents with recurring right-sided chest pain.   PROBLEMS.:  1. Chest pain/coronary artery disease.      a.     Status post anterior ST segment status post elevation       myocardial infarction, December 13, 2002.  Catheterization       revealing significant LAD stenosis that was successfully stented       with a 3.0 x 18 mm Cypher drug-eluting stent.  EF at the time was       documented to be 40%.      b.     September 18, 2007 catheterization/PCI .  Left main normal.       The LAD less than 10% in-stent.  The left circumflex normal.  RCA       9% mid; the RCA was treated with a 2.75 x 18 mm Promus drug-       eluting stent.  EF was 60% without regional wall motion       abnormalities.  2. Hypertension.  3. Hyperlipidemia.  4. Type 2 diabetes mellitus.  5. Situs inversus.  6. Depression.  7. Anxiety.   HISTORY OF PRESENT ILLNESS:  A 55 year old Caucasian male with history  of CAD, status post PCI and drug-eluting stent placement to the LAD;  following anterior ST-segment elevation MI in November 2004, and more  recently PCI and stent placement to the RCA on September 18, 2007.  At  that time he presented with exertional angina during his daily 2-mile  walks.  Since his discharge he has had daily right-sided 2 out of 10  chest tightness, without associated symptoms; occurring almost  exclusively with the usage of his right  arm/ slash shoulder with  activities like pushing, pulling or carrying a load.  Symptoms generally  last less than 1 minute and resolved by either stopping the activity or  switching the activity to his left arm.  He gave an example of mopping  the floor at work, where if he is predominately using his right arm the  discomfort will come on without  associated symptoms; but then he can  switch to his left arm and the discomfort will abate and he is able to  complete the activity.  Also notable is he has not had any symptoms  during his daily 2-mile walks.  He awoke during the night last night  with 2 out of 10 chest discomfort, as described above; that resolved  spontaneously is less than 1 minute.  When he  got this morning he noted  recurrent right-sided discomfort, and took a nitroglycerin without  immediate relief.  He then showered and then after about an hour  symptoms eased off.  When he called into the office he was advised to  present to the ED.   Here he was pain-free and his cardiac markers were negative x1.  An ECG  revealed no acute changes.  He does say that he has been fairly anxious  about his chest pain, along with the recent stent placement; he thinks  that his anxiety may be driving some of his symptoms.   ALLERGIES:  PENICILLIN, BENZODIAZEPINES (which cause hyperactivity).   HOME MEDICATIONS:  1. Plavix 75 mg daily.  2. Metformin 1000 mg b.i.d.  3. Simvastatin 20 mg q.h.s.  4. Aspirin 325 daily.  5. Accupril 20 mg b.i.d.  6. Lopressor 100 mg daily.  7. Glucotrol XL 10 mg b.i.d.   FAMILY HISTORY:  Mother is age 63 with a history of MI and diabetes; Her  MI was about 7 years ago.  Father died at age 82 following his second  MI; his first was at age 3.  He has four sisters, one deceased and  another has CAD with five stents.   SOCIAL HISTORY:  He lives in [place] with his girlfriend, who he says  has been causing him some stress recently.  He is previously  divorced  and has no children.  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  He  denies any tobacco abuse, and drinks less than a 12-pack of beer per  month.  He previously used marijuana and cocaine, but quit this about 3  years ago.  He is walking 2 miles a day.   REVIEW OF SYSTEMS:  Positive for chest pain, anxiety and a history of  diabetes.  Otherwise all systems reviewed are negative.   PHYSICAL EXAMINATION:  Temperature 97.9, heart rate 52, respirations 17,  blood pressure 146/85, pulse oximetry 95% on 2 liters.  A pleasant white  male in no acute distress.  Awake, alert and oriented x3.  NECK:  No bruits or JVD.  LUNGS:  Lung field respirations regular and unlabored.  Clear to  auscultation.  CARDIAC:  The patient has dextrocardia.  Regular S1 and S2; no S3, S4 or  murmurs.  ABDOMEN:  Round, soft, nontender, nondistended.  Bowel sounds present  x4,  EXTREMITIES:  Warm, dry, pink.  No clubbing, cyanosis or edema.  Dorsalis pedis and posterior tibial pulses 2+ and equal bilaterally.  At  the right groin site, which was previously used for catheterization and  PCI, was clear of bleeding, bruising or hematoma.  Unable to reproduce  chest pain with palpation, external and internal rotation of the  shoulder,  along with adduction and abduction.   CHEST X-RAY:  Shows situs inversus.  No acute abnormality. EKG shows  sinus bradycardia at a rate of 54; poor R-wave progression.   LAB WORK:  Hemoglobin 15.1, hematocrit 44.6, WBC 6.7, platelets 263.  Sodium 141, potassium 4.3, chloride 106, CO2 27.8,  BUN 13, creatinine  0.8, glucose 107.  CK-MB 4.2, troponin-I less than 0.05.   ASSESSMENT AND PLAN:  1. CHEST PAIN:  Somewhat atypical; occurring when using the right      upper extremity, but not when using the left upper extremity or      during his daily 2 mile walks.  Of note, with his last presentation      he  had exertional chest discomfort during his walks.  He did have 2       episodes of resting discomfort during the night and this morning,      with questionable response to nitroglycerin.  All this being said,      he is also recently status post PCI with drug-eluting stent      placement to the RCA.  He has not missed an aspirin or Plavix.  He      seems fairly anxious regarding his pain and stents.  His first set      of cardiac markers were negative and ECGs are basically.   We plan to admit and cycle cardiac enzymes.  Will continue his home  medications, with exception of the metformin.  Plan on cardiac  catheterization in the a.m. to rule out restenosis within his RCA stent.  Provided that his stents are patent, he likely could be discharged  following catheterization tomorrow.  1. HYPERTENSION:  Blood pressure is currently elevated.  We will plan      to follow; he may need calcium channel blocker therapy in addition      to his beta blocker and ACE inhibitor.  2. HYPERLIPIDEMIA:  Continue statin therapy.  Will check his LFTs.  3. TYPE DIABETES MELLITUS:  Hold his metformin.  Continue Glucotrol      and add sliding-scale insulin.  4. ANXIETY/DEPRESSION.  The patient does not take anything for this at      home.  He plans to follow up with his primary care Neng Albee at      [facility].  Notably, he experiences      hyperactivity with benzodiazepine; so be sure not to give this      prior to his catheterization.      Nicolasa Ducking, ANP      Luis Abed, MD, [facility]  Electronically Signed    CB/MEDQ  D:  10/10/2007  T:  10/11/2007  Job:  [id number]

## 2011-04-12 NOTE — Assessment & Plan Note (Signed)
[facility]                        CARDIOLOGY OFFICE NOTE   NAME:FULPChet, Greenley                         MRN:          [id number]  DATE:10/17/2007                            DOB:          August 25, 1956    REFERRING PHYSICIAN:  [facility]   Mr. Bigley returns to the office for continued assessment and treatment of  coronary disease following a recent admission to [facility]  with possible unstable angina.  He underwent a cardiac catheterization  which showed patency at his stent sites and no critical lesions.  He has  done well since.  He reports substantial anxiety and has an appointment  with Behavioral Health 4 months hence.  Records from his recent  hospitalization were obtained and reviewed.   CURRENT MEDICATIONS:  1. Glipizide 10 mg b.i.d.  2. Metoprolol 50 mg b.i.d.  3. Aspirin 325 mg daily.  4. Simvastatin 20 mg daily.  5. Clopidogrel 75 mg daily.  6. Metformin 1000 mg b.i.d.  7. A study drug daily.   PHYSICAL EXAMINATION:  GENERAL:  A pleasant gentleman with a somewhat  flat affect in no acute distress. The weight is 215, 4 pounds less than  in 2006.  VITAL SIGNS:  Blood pressure 130/85, heart rate 60 and regular,  respirations 18.  NECK:  No jugular venous distension; no carotid bruits.  LUNGS:  Clear.  CARDIAC:  Normal first and second heart sounds; fourth heart sound  present.  EXTREMITIES:  Benign catheterization site.   IMPRESSION:  Mr. Asato is doing generally well.  His prescription for  Simvastatin was renewed.  Lipid profile was excellent in hospital. All  cardiovascular risk factors are optimally controlled.  He requested and  was given a prescription for Xanax 0.5 mg b.i.d.   I will reassess this nice gentleman in 6 months.     Gerrit Friends. Dietrich Pates, MD, [facility]  Electronically Signed    RMR/MedQ  DD: 10/17/2007  DT: 10/17/2007  Job #: [id number]

## 2011-04-12 NOTE — Discharge Summary (Signed)
[name], [name]                ACCOUNT NO.:  [id number]   MEDICAL RECORD NO.:  [id number]          PATIENT TYPE:  INP   LOCATION:  4705                         FACILITY:  [facility]   PHYSICIAN:  Gerrit Friends. Dietrich Pates, MD, FACCDATE OF BIRTH:  1956-02-12   DATE OF ADMISSION:  10/10/2007  DATE OF DISCHARGE:  10/12/2007                               DISCHARGE SUMMARY   PRIMARY CARDIOLOGIST:  Gerrit Friends. Dietrich Pates, MD, [facility]   PRIMARY CARE Brance Dartt:  At [facility] Department.   DISCHARGE DIAGNOSIS:  Chest pain.   SECONDARY DIAGNOSES:  1. Coronary artery disease status post anterior ST-segment elevation      myocardial infarction on December 13, 2002 with Cypher drug-eluting      stent placement to the left anterior descending, and more recently      PROMUS drug-eluting stent placement to the right coronary artery on      September 18, 2007.  2. Hypertension.  3. Hyperlipidemia.  4. Type 2 diabetes mellitus.  5. Situs inversus.  6. Depression.  7. Anxiety   ALLERGIES:  PENICILLIN and BENZODIAZEPINES (hyperactivity).   PROCEDURES:  Left heart cardiac catheterization.   HISTORY OF PRESENT ILLNESS:  A 55 year old Caucasian male with a prior  history of CAD as outlined above who has most recently undergone drug-  eluting stent placement to the RCA on September 18, 2007.  Following his  discharge, he has noted 2/10 right-sided chest tightness without  associated symptoms occurring almost exclusively with usage of his right  arm and shoulder.  On the morning of admission, he had symptoms at rest  lasting approximately 1 hour with questionable relief from  nitroglycerin.  For this reason, he presented to the [facility] ED.  In  the ED, ECG showed no acute changes, and his first set of cardiac  markers were negative.  He was admitted for further evaluation.   HOSPITAL COURSE:  The patient ruled out for MI, and ECG remained stable.  He continued to have intermittent chest discomfort,  and because he  recently had drug-eluting stent placement, the decision was made to  pursue left heart cardiac catheterization which took place October 11, 2007, revealing no restenosis in either the LAD or the RCA stent, with  otherwise nonobstructive disease.  Post procedure, the patient has been  ambulating without difficulty.  He is being discharged home today in  satisfactory condition.   DISCHARGE LABORATORIES:  Hemoglobin 13.6, hematocrit 40.0, WBC 5.4,  platelets 193, MCV 90.9.  Sodium 139, potassium 3.9, chloride 107, CO2  of 25, BUN 12, creatinine 0.87, glucose 129, INR 0.9.  Total bilirubin  0.5, alkaline phosphatase 34, AST 30, ALT 37, albumin 3.7.  Cardiac  markers negative x4.   DISPOSITION:  The patient is being discharged home today in good  condition.   FOLLOW UP PLANS AND APPOINTMENTS:  He has follow up with Dr. Dietrich Pates  established on October 17, 2007 at 11:15 a.m.  He is asked to follow up  at [facility] Department in 2-3 weeks.   DISCHARGE MEDICATIONS:  1. Tracer research study drug.  2. Accupril 20 mg daily.  3. Metoprolol 25 mg b.i.d.  4. Aspirin 325 mg daily.  5. Plavix 75 mg daily.  6. Simvastatin 20 mg q.h.s.  7. Glucotrol XL 10 mg b.i.d.  8. Metformin 1000 mg b.i.d. to be resumed on October 13, 2007.  9. Nitroglycerin 0.4 mg sublingual p.r.n. chest pain.   OUTSTANDING LABORATORY STUDIES:  None.   DURATION OF DISCHARGE ENCOUNTER:  Forty minutes including physician  time.      Nicolasa Ducking, ANP      Gerrit Friends. Dietrich Pates, MD, [facility]  Electronically Signed    CB/MEDQ  D:  10/12/2007  T:  10/12/2007  Job:  [id number]   cc:   [facility]

## 2011-04-15 NOTE — Procedures (Signed)
NAMECARSEN, [name]                ACCOUNT NO.:  [id number]   MEDICAL RECORD NO.:  [id number]         PATIENT TYPE:  REC   LOCATION:  RAD                           FACILITY:  [facility]   PHYSICIAN:  Vida Roller, M.D.   DATE OF BIRTH:  Dec 19, 1955   DATE OF PROCEDURE:  DATE OF DISCHARGE:                                    STRESS TEST   HISTORY OF PRESENT ILLNESS:  The patient is a 55 year old gentleman with a  history of coronary artery disease status post Cypher stent with LAD in  January 2004.  Echocardiogram following this reveals an EF of 55 to 65%.  He  also has dextrocardia.  He now presents with atypical chest discomfort.   BASELINE DATA:  Electrocardiogram reveals a sinus rhythm at 62 beats per  minute.  Poor R wave progression.  Nonspecific ST abnormalities.  Blood  pressure was 158/90 (this was a right-sided EKG).   Patient exercised for a total of six minutes through Bruce protocol stage 2  and achieved 7.0 METS.  Maximum heart rate was 145 beats per minute which is  85% of predicted.  Maximum blood pressure was 230/98 and resolved down to  158/90 in recovery.  EKG revealed no arrhythmias and no ischemic changes  were noted.  The patient denied any symptoms.  Exercised was stopped  secondary to elevated blood pressure and achievement of target heart rate.  Of note, patient did hold his metoprolol last p.m. and this a.m.   Final images and results are pending MD review.      AB/MEDQ  D:  04/27/2005  T:  04/27/2005  Job:  [id number]

## 2011-04-15 NOTE — H&P (Signed)
[name], [place]                            ACCOUNT NO.:  [id number]   MEDICAL RECORD NO.:  [id number]                   PATIENT TYPE:  EMS   LOCATION:  ED                                   FACILITY:  [facility]   PHYSICIAN:  [place] Merritt, M.D. [facility]           DATE OF BIRTH:  1956/07/20   DATE OF ADMISSION:  12/13/2002  DATE OF DISCHARGE:                                HISTORY & PHYSICAL   HISTORY OF PRESENT ILLNESS:  The patient is a 55 year old gentleman with  multiple cardiovascular risk factors but with no prior cardiac history who  presents with prolonged chest discomfort.  The patient has had hypertension  and diabetes controlled with an oral agent.  His health care has been  spotty, most recently received from the local health department.  Over the  past two to three weeks he has noted episodes of chest pressure associated  with mild dyspnea.  These have lasted a number of minutes and resolved  spontaneously.  He had a prolonged episode lasting two hours last night, but  eventually fell asleep.  Upon awakening this morning he immediately noted  chest discomfort again which was persisted all day.  There has been some  exertional component.  The discomfort is not pleuritic.  There is some  radiation to the neck and to the interscapular area.   Since arrival in the emergency department a number of electrocardiograms  have been obtained with a number of different techniques.  These have  demonstrated probable mild ST segment elevation in leads I and L as well as  J point elevation anteriorly with question of hyperacute T waves.  A follow  up electrocardiogram with properly applied leads, in light of the patient's  dextrocardia, obtained at 2 PM demonstrates clear evidence for an  anteroseptal myocardial infarction.   PAST MEDICAL HISTORY:  Otherwise unremarkable.  The patient has never been  hospitalized.  He has not had any surgeries.  He has never previously had an  electrocardiogram nor any cardiac testing.   MEDICATIONS ON ADMISSION:  1. Benazepril 10 mg daily.  2. Avalide 12.5/180 mg daily.  3. Amaryl 3 mg daily.  4. Aspirin 81 mg daily.   SOCIAL HISTORY:  The patient lives in [place] with his mother;  works as  a Estate agent; divorced with no children.  No history of alcohol nor  tobacco.  Does use marijuana.   FAMILY HISTORY:  Both mother and father suffered myocardial infarctions at a  young age.   REVIEW OF SYMPTOMS:  Notable for chronic nasal discharge, occasional cough  and palpitations.  Generalized weakness and intermittent constipation.  All  other systems negative.   PHYSICAL EXAMINATION:  GENERAL:  Only mildly uncomfortable appearing  gentleman.  VITAL SIGNS:  The heart rate is 100 and regular.  Respirations 18.  Blood  pressure 160/95.  Oxygen saturation 100% on nasal oxygen.  HEENT:  Anicteric sclerae.  NECK:  No jugular venous distention; no carotid bruits.  LUNGS:  Clear.  CARDIAC:  Normal first and second heart sounds; fourth heart sound present.  ABDOMEN:  Soft and nontender.  No bruits.  EXTREMITIES:  Distal pulses intact.  No edema.  SKIN:  No significant lesions.  ENDOCRINE:  No thyromegaly.  NEUROMUSCULAR:  Symmetric strength and tone.   LABORATORY DATA:  Chest x-ray:  No active disease.  Other laboratory studies  unremarkable except for initial CPK of 78, MB of 4 and troponin of 0.12.   Echocardiogram demonstrates akinesis of the mid and distal septum.  There is  also a question of hypokinesis of the proximal posterior wall.   IMPRESSION:  [name] with diabetes and hypertension plus a  dramatically positive family history suffering an acute anteroseptal  myocardial infarction.  Initial treatment has included aspirin, intravenous  heparin, intravenous nitroglycerin.  Metoprolol will be added.  The patient  has received morphine but pain control is inadequate.  He will be  transported to [facility] for urgent percutaneous coronary  intervention.                                               [name], M.D. [facility]    RR/MEDQ  D:  12/13/2002  T:  12/13/2002  Job:  [id number]

## 2011-04-15 NOTE — Discharge Summary (Signed)
[name], [name]                            ACCOUNT NO.:  [id number]   MEDICAL RECORD NO.:  [id number]                   PATIENT TYPE:  INP   LOCATION:  2008                                 FACILITY:   PHYSICIAN:  Vida Roller, M.D.                DATE OF BIRTH:  1955/12/29   DATE OF ADMISSION:  12/13/2002  DATE OF DISCHARGE:  12/17/2002                           DISCHARGE SUMMARY - REFERRING   PROCEDURES:  1. Cardiac catheterization.  2. Coronary arteriogram.  3. Left ventriculogram.  4. Percutaneous transluminal coronary angioplasty and stent to one vessel.  5. 2-D echocardiogram.   HOSPITAL COURSE:  The patient is a 55 year old male with no known history of  coronary artery disease who has cardiac risk factors of hypertension and  diabetes.  Additionally, he has a history of dextrocardia. He came to the  emergency room  on the day of admission complaining of chest pain.  He has  been having episodes of chest discomfort lasting 20 to 30 minutes since  about Christmas.  The episodes were becoming more frequent and associated  with exertion.  On the night before admission, he had chest pain that lasted  two hours.  The pain returned on the day of admission when he awoke.  The  pain was still present upon examination in the emergency room.  His initial  troponin was borderline at 0.12 and his CK-MB was 78/3.9.  His EKG had some  ST elevation in the anterior leads and it was felt that he was having an  acute anterior myocardial infarction and was treated with IV heparin,  nitroglycerin and IV metoprolol.  He was transferred to [facility]  for urgent catheterization.   The cardiac catheterization showed a norma left main and an LAD with a 40%  proximal stenosis and 100% stenosis in the mid portion.  A Cypher stent was  placed and the stenosis was reduced with 0% with TIMI-3 flow.  The RCA had a  50% mid stenosis and a 60% distal stenosis.  The circumflex had no  critical  disease.  His EF at the time of catheterization was 40% with anterolateral  and apical akinesis.  He tolerated the procedure well.   He was placed on Plavix for three months and aspirin for life.  As a part of  his workup, he had his lipids checked and they showed a total cholesterol of  188 with triglycerides of 182, HDL 38, LDL 114.  A statin was added to his  medication regimen he needs his lipid and liver function testing performed  again in six to 12 weeks.  The patient also had problems with hypertension  and had a beta blocker added to his medications.  He was on Avalide prior to  admission and this was discontinued.  He was started on Lopressor and this  was advanced to 50 mg p.o. b.i.d.  He tolerated  this well.   The patient had been on Amaryl prior to admission for diabetes.  In this  admission, blood sugar was over 200.  Hemoglobin A1c was significantly  elevated at 9.0%.  A nutrition consult was called and he was seen in  hospital and referred to outpatient education as well.  He is to continue  his Amaryl at his home dose and followup with his primary care physician.   As part of his post myocardial infarction therapy, he was seen by cardiac  rehab.  By 12/17/02, he was ambulating 530 feet with a stable blood pressure  and no chest pain or shortness of breath.   The patient had some financial issues with his medications and a case  manager consult was called.  He is being given a two week supply of Plavix  from the hospital and is to pick up some Zocor at the office on his way  home.  The patient stated that he felt like he would be able to afford his  medications once he went back to work.  We will try to provide assistance as  we are able.   With improvement in the patient's condition and stability of his blood  pressure  and heart rate, he is tentatively considered stable for discharge  on 12/17/02 pending evaluation by the physician.   LABORATORY DATA:   Sodium 135, potassium 4.3, chloride 103, CO2 25, BUN 12,  creatinine 0.9, glucose 170.  Hemoglobin 12.7, hematocrit 36.7, WBC 9.4,  platelets 207.  CK-MB peak 2665/232.7 with a troponin I peak of 9.6.   2-D ECHOCARDIOGRAM:  Dextrocardia with  situs inversus.  Overall EF was  normal with an ejection fraction of 55 to 65% and distal septal and apical  hypokinesis.  The left ventricular wall thickness was moderately increased  and the aortic valve was mildly calcified.  The left atrium was mildly  dilated.  No other significant abnormalities.   DISCHARGE DIAGNOSES:  1. Acute anterior myocardial infarction.  2. Hypertension.  3. Hyperlipidemia.  4. Dextrocardia.  5. Diabetes.  6. HISTORY OF ALLERGY TO PENICILLIN.   DISCHARGE INSTRUCTIONS:  1. The patient is to do no driving for a week and no sexual or strenuous     activity for two days.  2. He is to stick to a low fat, diabetic diet.  3. He is to call the office for problems with the catheterization site.  4. He has an appointment with the physician assistant for Dr. Dorethea Clan on     Tuesday 2/3 at noon.  5. He is to follow up with his primary care physician as needed.   DISCHARGE MEDICATIONS:  1. Amaryl 3 mg daily.  2. Aspirin 325 mg daily.  3. Plavix 75 mg daily.  4. Lotensin 10 mg daily.  5. Lopressor 50 mg b.i.d.  6. Nitroglycerin p.r.n.  7. Zocor 40 mg daily.     Lavella Hammock, P.A. [facility]                  Vida Roller, M.D.    RG/MEDQ  D:  12/17/2002  T:  12/17/2002  Job:  [id number]   cc:   [facility]

## 2011-04-15 NOTE — Cardiovascular Report (Signed)
[name], KILPATRICK                            ACCOUNT NO.:  [id number]   MEDICAL RECORD NO.:  [id number]                   PATIENT TYPE:  INP   LOCATION:  2921                                 FACILITY:  [facility]   PHYSICIAN:  Salvadore Farber, M.D. [facility]         DATE OF BIRTH:  24-Oct-1956   DATE OF PROCEDURE:  12/13/2002  DATE OF DISCHARGE:                              CARDIAC CATHETERIZATION   PROCEDURES:  Left heart catheterization, left ventriculography, coronary  angiography, AngioJet to the left anterior descending as part of the AIMI  protocol, stent to the left anterior descending, temporary pacing wire.   INDICATIONS:  The patient is a 55 year old gentleman with hypertension,  diabetes mellitus, and dextrocardia who presents with several weeks of  episodic chest discomfort typically lasting 20-30 minutes.  His family notes  a prolonged episode of pain on Christmas day.  While being evaluated at  [facility] he had worsening pain associated with anterior ST elevations.  When pain and electrocardiographic abnormalities were not relieved after  nitroglycerin, he was referred for diagnostic angiography on an emergent  basis.  The patient arrived here having 5/10 chest discomfort with  persistent ST elevations. He was brought directly to the cardiac  catheterization laboratory for diagnostic angiography without any  intervention.   DIAGNOSTIC TECHNIQUE:  Informed consent was obtained. Under 1% lidocaine  local anesthesia, a 6 French sheath was placed into the right femoral artery  using the modified Seldinger technique. Diagnostic angiography was performed  using JL4 and JR4 catheters.  Ventriculography was performed in the LAO  projection by power injection via pigtail catheter.  The case then turned to  intervention.   DIAGNOSTIC FINDINGS:  1. LV:  EF approximately 40% with anterolateral and apical akinesis.  2. No mitral regurgitation or aortic stenosis.  3. Dextrocardia.  4. Left main:  Angiographically normal.  5. LAD:  The LAD is a large vessel with 40% stenosis of the proximal vessel     and occlusion of the mid vessel. The distal LAD received faint collateral     filling from the right.  6. Circumflex:  The circumflex is a large vessel giving rise to two obtuse     marginal branches.  It is angiographically normal.  7. RCA:  The RCA is a large, dominant vessel.  There is a 50% stenosis in     the mid vessel.  The posterior left ventricular branch has a 60% stenosis     proximally.   PERCUTANEOUS CORONARY INTERVENTION TECHNIQUE:  Anticoagulation was initiated  with heparin and Integrilin to achieve an ACT of greater than 250 seconds.  The patient was randomized to the AngioJet on the AIMI trial.  Therefore, a  6 French sheath was placed in the right femoral vein using the modified  Seldinger technique. A balloon-tipped pacing wire was advanced via the  venous sheath into the apex of the right ventricle.  Appropriate  capture was  confirmed and the patient said to backup pacing.  A CRS 3.5 guide was  advanced over a wire and engaged in the ostium of the left main coronary.  A  BMW was advanced to the distal LAD without difficulty.  A 2.5 x 20 mm  Maverick balloon was then used to dodder the vessel.  No inflations were  made.  This restored TIMI-3 flow to the distal vessel.  Three passes were  then performed using the AngioJet device with modest resolution of the  thrombotic lesion.  The lesion was then stented using a 3.0 x 18 mm Cypher  deployed at 14 atmospheres.  The stent was then post-dilated using a 3.0 x  18 mm PowerSail at 16 atmospheres.  After stent post-dilation, flow was  technically TIMI-3, but appeared a bit sluggish.  Therefore, intracoronary  nitroglycerin and then intracoronary verapamil (200 mcg) each were  administered.  This established brisk TIMI-3 flow.  Final angiograms  demonstrated no residual stenosis and TIMI-3 flow to the  distal vessel.   IMPRESSION/RECOMMENDATIONS:  Successful stenting of the occluded left  anterior descending restoring TIMI-3 flow to the distal vessel.  The patient  will be continued on aspirin indefinitely.  Plavix will be continued for a  minimum of three months given the drug-eluting stent placed in the mid left  anterior descending.  Post myocardial infarction care with beta blocker, ACE  inhibitor and statin will be administered.                                                       Salvadore Farber, M.D. [facility]    WED/MEDQ  D:  12/13/2002  T:  12/14/2002  Job:  [id number]

## 2011-09-06 LAB — DIFFERENTIAL
Basophils Absolute: 0
Basophils Relative: 0
Basophils Relative: 1
Eosinophils Relative: 2
Lymphocytes Relative: 29
Monocytes Relative: 9
Neutro Abs: 3.5
Neutrophils Relative %: 58
Neutrophils Relative %: 66

## 2011-09-06 LAB — CARDIAC PANEL(CRET KIN+CKTOT+MB+TROPI)
CK, MB: 4.9 — ABNORMAL HIGH
Relative Index: 2.7 — ABNORMAL HIGH
Relative Index: 2.9 — ABNORMAL HIGH
Relative Index: INVALID
Total CK: 82
Troponin I: 0.02
Troponin I: 0.02
Troponin I: 0.02

## 2011-09-06 LAB — COMPREHENSIVE METABOLIC PANEL
ALT: 37
AST: 30
Albumin: 3.7
Alkaline Phosphatase: 34 — ABNORMAL LOW
Potassium: 3.9

## 2011-09-06 LAB — I-STAT 8, (EC8 V) (CONVERTED LAB)
BUN: 13
Chloride: 106
Glucose, Bld: 107 — ABNORMAL HIGH
HCT: 48
Potassium: 4.3
pCO2, Ven: 53.1 — ABNORMAL HIGH
pH, Ven: 7.327 — ABNORMAL HIGH

## 2011-09-06 LAB — CBC
HCT: 42.2
Hemoglobin: 13.6
Hemoglobin: 14.2
MCHC: 33.7
MCHC: 34
MCHC: 34
MCV: 90.3
MCV: 90.6
Platelets: 263
RBC: 4.4
RDW: 12.6
RDW: 12.7
WBC: 5.4

## 2011-09-06 LAB — BASIC METABOLIC PANEL
Calcium: 9.2
Glucose, Bld: 133 — ABNORMAL HIGH
Potassium: 3.6
Sodium: 140

## 2011-09-06 LAB — POCT CARDIAC MARKERS
Myoglobin, poc: 102
Troponin i, poc: <0.05

## 2011-09-06 LAB — HEPARIN LEVEL (UNFRACTIONATED)
Heparin Unfractionated: 0.57
Heparin Unfractionated: 0.68

## 2011-09-06 LAB — CK TOTAL AND CKMB (NOT AT ARMC): CK, MB: 6.4 — ABNORMAL HIGH

## 2011-09-06 LAB — POCT I-STAT CREATININE: Creatinine, Ser: 0.8

## 2011-09-06 LAB — PROTIME-INR: Prothrombin Time: 12.3

## 2011-09-07 LAB — DIFFERENTIAL
Basophils Absolute: 0
Eosinophils Relative: 1
Lymphocytes Relative: 22
Lymphs Abs: 1.8
Monocytes Absolute: 0.8 — ABNORMAL HIGH
Neutro Abs: 5.3

## 2011-09-07 LAB — BASIC METABOLIC PANEL
BUN: 12
CO2: 24
CO2: 29
Calcium: 9.1
Chloride: 106
Chloride: 106
Chloride: 108
GFR calc Af Amer: >60
GFR calc Af Amer: >60
GFR calc non Af Amer: >60
Glucose, Bld: 94
Glucose, Bld: 97
Potassium: 3.9
Potassium: 3.9
Potassium: 4.5
Sodium: 140
Sodium: 141
Sodium: 142

## 2011-09-07 LAB — CBC
HCT: 41.7
HCT: 44.3
Hemoglobin: 14.2
Hemoglobin: 14.9
MCHC: 33.6
MCHC: 34.2
MCV: 90.8
RBC: 4.59
RBC: 4.89
RDW: 12.7
RDW: 12.9

## 2011-09-07 LAB — CARDIAC PANEL(CRET KIN+CKTOT+MB+TROPI)
CK, MB: 3.4
CK, MB: 4
Relative Index: INVALID
Total CK: 95
Total CK: 98
Troponin I: 0.03

## 2011-09-07 LAB — CK TOTAL AND CKMB (NOT AT ARMC)
Relative Index: 3.8 — ABNORMAL HIGH
Total CK: 83

## 2011-09-07 LAB — TROPONIN I: Troponin I: <0.01

## 2011-09-07 LAB — LIPID PANEL: Cholesterol: 120

## 2011-09-07 LAB — POCT CARDIAC MARKERS
Operator id: 151321
Troponin i, poc: <0.05

## 2011-09-07 LAB — APTT: aPTT: 27

## 2011-09-07 LAB — PROTIME-INR: Prothrombin Time: 12.3

## 2011-10-24 ENCOUNTER — Telehealth: Payer: Self-pay | Admitting: Cardiology

## 2011-10-24 NOTE — Telephone Encounter (Signed)
**Note De-Identified [name] Obfuscation** Pt. is 6 months overdue for f/u. Attempted to reach pt. by telephone but no answer, will continue to call. Can pt. hold Plavix for tooth extraction and if so for how long? Please advise./LV

## 2011-10-24 NOTE — Telephone Encounter (Signed)
CALLING TO GET INSTRUCTIONS: PT IS ON PLAVIX AND NEEDS TO HAVE A TOOTH PILLED. WAITING ON WRITTEN INSTRUCTIONS BEFORE SCHEDULING. PLEASE FAX TO [phone]

## 2011-10-25 ENCOUNTER — Encounter: Payer: Self-pay | Admitting: Cardiology

## 2011-10-25 NOTE — Telephone Encounter (Signed)
Yes

## 2011-11-04 ENCOUNTER — Encounter: Payer: Self-pay | Admitting: Cardiology

## 2011-11-28 ENCOUNTER — Ambulatory Visit: Payer: No Typology Code available for payment source | Admitting: Cardiology

## 2012-01-24 ENCOUNTER — Ambulatory Visit: Payer: No Typology Code available for payment source | Admitting: Adult Health

## 2013-12-14 ENCOUNTER — Emergency Department (HOSPITAL_COMMUNITY): Payer: Self-pay

## 2013-12-14 ENCOUNTER — Encounter (HOSPITAL_COMMUNITY): Payer: Self-pay | Admitting: Emergency Medicine

## 2013-12-14 ENCOUNTER — Emergency Department (HOSPITAL_COMMUNITY)
Admission: EM | Admit: 2013-12-14 | Discharge: 2013-12-14 | Disposition: A | Discharge status: Home / Self Care | Payer: Self-pay | Attending: Emergency Medicine | Admitting: Emergency Medicine

## 2013-12-14 DIAGNOSIS — Z7982 Long term (current) use of aspirin: Secondary | ICD-10-CM | POA: Insufficient documentation

## 2013-12-14 DIAGNOSIS — G44209 Tension-type headache, unspecified, not intractable: Secondary | ICD-10-CM | POA: Insufficient documentation

## 2013-12-14 DIAGNOSIS — F411 Generalized anxiety disorder: Secondary | ICD-10-CM | POA: Insufficient documentation

## 2013-12-14 DIAGNOSIS — Z7902 Long term (current) use of antithrombotics/antiplatelets: Secondary | ICD-10-CM | POA: Insufficient documentation

## 2013-12-14 DIAGNOSIS — I2510 Atherosclerotic heart disease of native coronary artery without angina pectoris: Secondary | ICD-10-CM | POA: Insufficient documentation

## 2013-12-14 DIAGNOSIS — E119 Type 2 diabetes mellitus without complications: Secondary | ICD-10-CM | POA: Insufficient documentation

## 2013-12-14 DIAGNOSIS — I10 Essential (primary) hypertension: Secondary | ICD-10-CM | POA: Insufficient documentation

## 2013-12-14 DIAGNOSIS — Z79899 Other long term (current) drug therapy: Secondary | ICD-10-CM | POA: Insufficient documentation

## 2013-12-14 DIAGNOSIS — Z880 Allergy status to penicillin: Secondary | ICD-10-CM | POA: Insufficient documentation

## 2013-12-14 DIAGNOSIS — J329 Chronic sinusitis, unspecified: Secondary | ICD-10-CM | POA: Insufficient documentation

## 2013-12-14 MED ORDER — FENTANYL CITRATE 0.05 MG/ML IJ SOLN
50.0000 ug | Freq: Once | INTRAMUSCULAR | Status: AC
  Administered 2013-12-14: 50 ug via INTRAVENOUS
  Filled 2013-12-14: qty 2

## 2013-12-14 MED ORDER — DIAZEPAM 5 MG/ML IJ SOLN
5.0000 mg | Freq: Once | INTRAMUSCULAR | Status: AC
  Administered 2013-12-14: 5 mg via INTRAVENOUS
  Filled 2013-12-14: qty 2

## 2013-12-14 MED ORDER — KETOROLAC TROMETHAMINE 30 MG/ML IJ SOLN
30.0000 mg | Freq: Once | INTRAMUSCULAR | Status: AC
  Administered 2013-12-14: 30 mg via INTRAVENOUS
  Filled 2013-12-14: qty 1

## 2013-12-14 MED ORDER — AZITHROMYCIN 250 MG PO TABS: ORAL_TABLET | ORAL | Status: DC

## 2013-12-14 MED ORDER — TRAMADOL HCL 50 MG PO TABS: 50.0000 mg | ORAL_TABLET | Freq: Four times a day (QID) | ORAL | Status: DC | PRN

## 2013-12-14 MED ORDER — IBUPROFEN 600 MG PO TABS: 600.0000 mg | ORAL_TABLET | Freq: Four times a day (QID) | ORAL | Status: DC | PRN

## 2013-12-14 MED ORDER — DIAZEPAM 5 MG PO TABS: 5.0000 mg | ORAL_TABLET | Freq: Two times a day (BID) | ORAL | Status: DC | PRN

## 2013-12-14 NOTE — ED Notes (Signed)
Reports severe headache and neck pain since yesterday morning worsening at 2pm on 1/16.  Unable to lie down due to increased pain.  Reports worse pain he's ever had.  Is also taking medication for anxiety but has been out for 4-5 days.  Appears agitated over changing of medications.

## 2013-12-14 NOTE — ED Notes (Signed)
Patient transported to CT 

## 2013-12-14 NOTE — ED Notes (Signed)
Pt sister to bedside.  Reports pain is still 10.  Will recheck after medications have chance to work.

## 2013-12-14 NOTE — Discharge Instructions (Signed)
Sinus Headache °A sinus headache happens when your sinuses become clogged or puffy (swollen). Sinus headaches can be mild or severe. °HOME CARE °· Take your medicines (antibiotics) as told. Finish them even if you start to feel better. °· Only take medicine as told by your doctor. °· Use a nose spray if you feel stuffed up (congested). °GET HELP RIGHT AWAY IF: °· You have a fever. °· You have trouble seeing. °· You suddenly have pain in your face or head. °· You start to twitch or shake (seizure). °· You are confused. °· You get headaches more than once a week. °· Light or sound bothers you. °· You feel sick to your stomach (nauseous) or throw up (vomit). °· Your headaches do not get better with treatment. °MAKE SURE YOU: °· Understand these instructions. °· Will watch your condition. °· Will get help right away if you are not doing well or get worse. °Document Released: 03/16/2011 Document Revised: 02/06/2012 Document Reviewed: 03/16/2011 °Exit[employer]® Patient Information ©2014 [employer]. ° °

## 2013-12-14 NOTE — ED Provider Notes (Signed)
CSN: [id number]     Arrival date & time 12/14/13  0309 History   First MD Initiated Contact with Patient 12/14/13 0315     Chief Complaint  Patient presents with  . Headache  . Neck Pain   (Consider location/radiation/quality/duration/timing/severity/associated sxs/prior Treatment) HPI 24 hours of neck and head pain. Denies recent trauma. Patient says he had right shoulder pain ealier in the week. 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 He can't find a comfortable position for his neck and head. Hurts to rotate neck from side to side. Does not recall injury. Headache localizes to the occipitoparietal region bilaterally.   The patient denies any focal neurologic deficits. Denies history of similar sx.  Past Medical History  Diagnosis Date  . Anxiety   . CAD (coronary artery disease)   . Dextrocardia   . Diabetes mellitus   . Hypertension    Past Surgical History  Procedure Laterality Date  . Cardiac surgery     History reviewed. No pertinent family history. History  Substance Use Topics  . Smoking status: Never Smoker   . Smokeless tobacco: Not on file  . Alcohol Use: 3.6 oz/week    6 Cans of beer per week     Comment: every 2 weeks    Review of Systems Ten point review of symptoms performed and is negative with the exception of symptoms noted above.   Allergies  Penicillins  Home Medications   Current Outpatient Rx  Name  Route  Sig  Dispense  Refill  . aspirin 325 MG tablet   Oral   Take 325 mg by mouth daily.           . clopidogrel (PLAVIX) 75 MG tablet   Oral   Take 75 mg by mouth daily.           . diazepam (VALIUM) 10 MG tablet   Oral   Take 10 mg by mouth 2 (two) times daily as needed for anxiety.         Marland Kitchen glipiZIDE (GLUCOTROL) 10 MG tablet   Oral   Take 10 mg by mouth 2 (two) times daily.           Marland Kitchen lisinopril (PRINIVIL,ZESTRIL) 20 MG tablet   Oral   Take 20 mg by mouth daily.         Marland Kitchen lovastatin (MEVACOR) 20 MG  tablet   Oral   Take 20 mg by mouth 2 (two) times daily.         . metFORMIN (GLUCOPHAGE) 1000 MG tablet   Oral   Take 1,000 mg by mouth 2 (two) times daily.           . metoprolol (TOPROL-XL) 50 MG 24 hr tablet   Oral   Take 50 mg by mouth daily.           . nitroGLYCERIN (NITROSTAT) 0.4 MG SL tablet   Sublingual   Place 0.4 mg under the tongue every 5 (five) minutes as needed. May repeat for up to 3 doses.           BP 167/86  Pulse 82  Temp(Src) 98.9 F (37.2 C) (Oral)  Ht 5\' 11"  (1.803 m)  Wt 210 lb (95.255 kg)  BMI 29.30 kg/m2  SpO2 96% Physical Exam Gen: well developed and well nourished appearing Head: NCAT Eyes: PERL, EOMI Nose: no epistaixis or rhinorrhea Mouth/throat: mucosa is moist and pink Neck: no midline ttp, ttp over the paraspinal musculature  bilaterally - right > left with some palpable muscle spasm on right. Painful rotation to the right Lungs: CTA B, no wheezing, rhonchi or rales CV: RRR, no murmur, extremities appear well perfused.  Abd: soft, notender, nondistended Back: no ttp, no cva ttp Skin: warm and dry Ext: normal to inspection, no dependent edema Neuro: CN ii-xii grossly intact, no focal deficits, motor strength 5/5 both arms and legs, normal speech Psyche; normal affect,  calm and cooperative.   ED Course  Procedures (including critical care time) Labs Review  CT Head Wo Contrast (Final result)  Result time: 12/14/13 05:10:32    Final result by Rad Results In Interface (12/14/13 05:10:32)    Narrative:   CLINICAL DATA: Severe head and neck pain  EXAM: CT HEAD WITHOUT CONTRAST  CT CERVICAL SPINE WITHOUT CONTRAST  TECHNIQUE: Multidetector CT imaging of the head and cervical spine was performed following the standard protocol without intravenous contrast. Multiplanar CT image reconstructions of the cervical spine were also generated.  COMPARISON: None available.  FINDINGS: CT HEAD FINDINGS  Mild cerebral atrophy is  present. Scattered and confluent hypodensity within the periventricular white matter is most compatible with mild chronic microvascular ischemic changes.  There is no acute intracranial hemorrhage or infarct. No mass lesion or midline shift. Gray-white matter differentiation is well maintained. Ventricles are normal in size without evidence of hydrocephalus. CSF containing spaces are within normal limits. No extra-axial fluid collection.  The calvarium is intact.  Orbital soft tissues are within normal limits.  Moderate mucoperiosteal thickening present within the maxillary sinuses bilaterally. There is partial opacification of the ethmoidal air cells bilaterally. Layering opacity is present within the inferior aspects of the frontal sinuses and frontoethmoidal recesses bilaterally. Air-fluid level present within the left sphenoid sinus. Mucoperiosteal thickening present within both sphenoid sinuses as well.  No mastoid effusion.  Scalp soft tissues are unremarkable.  CT CERVICAL SPINE FINDINGS  There is straightening of the normal cervical lordosis. Vertebral body heights are preserved. Normal C1-2 articulations are intact. No prevertebral soft tissue swelling. No acute fracture or listhesis.  Moderate degenerative disc disease as evidenced by intervertebral disc space narrowing and endplate osteophytosis is present at C5-6 and C6-7. Heterotopic calcification noted posterior to the C5 spinous process.  Visualized soft tissues of the neck are within normal limits. Visualized lung apices are clear without evidence of apical pneumothorax.  IMPRESSION: CT BRAIN:  1. No acute intracranial abnormality. 2. Mild atrophy and chronic microvascular ischemic changes. 3. Pansinusitis. This may be either infectious or inflammatory in nature.  CT CERVICAL SPINE:  1. No acute fracture or traumatic injury within the cervical spine. 2. Moderate degenerative disc disease at C5-6 and  C6-7.   Electronically Signed By: Rise Mu M.D. On: 12/14/2013 05:10             CT Cervical Spine Wo Contrast (Final result)  Result time: 12/14/13 05:10:32    Final result by Rad Results In Interface (12/14/13 05:10:32)    Narrative:   CLINICAL DATA: Severe head and neck pain  EXAM: CT HEAD WITHOUT CONTRAST  CT CERVICAL SPINE WITHOUT CONTRAST  TECHNIQUE: Multidetector CT imaging of the head and cervical spine was performed following the standard protocol without intravenous contrast. Multiplanar CT image reconstructions of the cervical spine were also generated.  COMPARISON: None available.  FINDINGS: CT HEAD FINDINGS  Mild cerebral atrophy is present. Scattered and confluent hypodensity within the periventricular white matter is most compatible with mild chronic microvascular ischemic changes.  There  is no acute intracranial hemorrhage or infarct. No mass lesion or midline shift. Gray-white matter differentiation is well maintained. Ventricles are normal in size without evidence of hydrocephalus. CSF containing spaces are within normal limits. No extra-axial fluid collection.  The calvarium is intact.  Orbital soft tissues are within normal limits.  Moderate mucoperiosteal thickening present within the maxillary sinuses bilaterally. There is partial opacification of the ethmoidal air cells bilaterally. Layering opacity is present within the inferior aspects of the frontal sinuses and frontoethmoidal recesses bilaterally. Air-fluid level present within the left sphenoid sinus. Mucoperiosteal thickening present within both sphenoid sinuses as well.  No mastoid effusion.  Scalp soft tissues are unremarkable.  CT CERVICAL SPINE FINDINGS  There is straightening of the normal cervical lordosis. Vertebral body heights are preserved. Normal C1-2 articulations are intact. No prevertebral soft tissue swelling. No acute fracture or  listhesis.  Moderate degenerative disc disease as evidenced by intervertebral disc space narrowing and endplate osteophytosis is present at C5-6 and C6-7. Heterotopic calcification noted posterior to the C5 spinous process.  Visualized soft tissues of the neck are within normal limits. Visualized lung apices are clear without evidence of apical pneumothorax.  IMPRESSION: CT BRAIN:  1. No acute intracranial abnormality. 2. Mild atrophy and chronic microvascular ischemic changes. 3. Pansinusitis. This may be either infectious or inflammatory in nature.  CT CERVICAL SPINE:  1. No acute fracture or traumatic injury within the cervical spine. 2. Moderate degenerative disc disease at C5-6 and C6-7.   Electronically Signed By: Rise MuBenjamin McClintock M.D. On: 12/14/2013 05:10          MDM   Patient is feeling better. I believe that he is experiencing tension headache. However, CT head was notable for signs of pansinusitis. We will cover with zpack and tx tension headache with valium and tramadol and ibuprofen.  [facility]    Brandt LoosenJulie Manly, MD 12/14/13 [phone]

## 2013-12-14 NOTE — ED Notes (Signed)
18 ga iv lt ac placed by EMS removed prior to discharge.  Entire catheter in tact.  No bleeding at site after minimal pressure.

## 2013-12-14 NOTE — ED Notes (Signed)
Returned from CT.  Reports pain in head no longer throbbing but neck is still sore.  Is able to lay head down on bed where he was not able to do so before. Is moving head slowly back and forth a little.

## 2014-03-28 DIAGNOSIS — Q240 Dextrocardia: Secondary | ICD-10-CM | POA: Insufficient documentation

## 2014-03-28 DIAGNOSIS — I10 Essential (primary) hypertension: Secondary | ICD-10-CM | POA: Insufficient documentation

## 2014-03-28 DIAGNOSIS — G629 Polyneuropathy, unspecified: Secondary | ICD-10-CM | POA: Insufficient documentation

## 2014-03-31 DIAGNOSIS — I2510 Atherosclerotic heart disease of native coronary artery without angina pectoris: Secondary | ICD-10-CM | POA: Insufficient documentation

## 2014-03-31 DIAGNOSIS — Z955 Presence of coronary angioplasty implant and graft: Secondary | ICD-10-CM | POA: Insufficient documentation

## 2014-03-31 DIAGNOSIS — I252 Old myocardial infarction: Secondary | ICD-10-CM

## 2014-07-07 DIAGNOSIS — E1165 Type 2 diabetes mellitus with hyperglycemia: Principal | ICD-10-CM

## 2014-07-07 DIAGNOSIS — E1169 Type 2 diabetes mellitus with other specified complication: Secondary | ICD-10-CM | POA: Insufficient documentation

## 2014-07-07 DIAGNOSIS — E785 Hyperlipidemia, unspecified: Secondary | ICD-10-CM | POA: Insufficient documentation

## 2014-07-07 DIAGNOSIS — E119 Type 2 diabetes mellitus without complications: Secondary | ICD-10-CM | POA: Insufficient documentation

## 2015-11-29 DIAGNOSIS — M5126 Other intervertebral disc displacement, lumbar region: Secondary | ICD-10-CM | POA: Insufficient documentation

## 2015-11-29 DIAGNOSIS — M51369 Other intervertebral disc degeneration, lumbar region without mention of lumbar back pain or lower extremity pain: Secondary | ICD-10-CM

## 2015-11-29 DIAGNOSIS — M5136 Other intervertebral disc degeneration, lumbar region: Secondary | ICD-10-CM

## 2015-11-29 HISTORY — DX: Other intervertebral disc displacement, lumbar region: M51.26

## 2015-11-29 HISTORY — DX: Other intervertebral disc degeneration, lumbar region: M51.36

## 2015-11-29 HISTORY — DX: Other intervertebral disc degeneration, lumbar region without mention of lumbar back pain or lower extremity pain: M51.369

## 2015-12-02 ENCOUNTER — Telehealth: Payer: Self-pay | Admitting: Cardiology

## 2015-12-02 NOTE — Telephone Encounter (Signed)
[facility] for appointment on 12/28/15 with Dr Jens Somrenshaw.  [facility] Dr Ludwig Clarksrenshaw's schedule on 12/28/15. lp

## 2015-12-21 ENCOUNTER — Encounter: Payer: Self-pay | Admitting: Neurology

## 2015-12-21 ENCOUNTER — Ambulatory Visit (INDEPENDENT_AMBULATORY_CARE_PROVIDER_SITE_OTHER): Payer: BLUE CROSS/BLUE SHIELD | Admitting: Neurology

## 2015-12-21 VITALS — BP 156/87 | HR 58 | Ht 71.0 in | Wt 209.0 lb

## 2015-12-21 DIAGNOSIS — G5600 Carpal tunnel syndrome, unspecified upper limb: Secondary | ICD-10-CM | POA: Insufficient documentation

## 2015-12-21 DIAGNOSIS — E1142 Type 2 diabetes mellitus with diabetic polyneuropathy: Secondary | ICD-10-CM | POA: Diagnosis not present

## 2015-12-21 DIAGNOSIS — G5603 Carpal tunnel syndrome, bilateral upper limbs: Secondary | ICD-10-CM
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
## 2015-12-24 NOTE — Progress Notes (Signed)
HPI: 60 year old male previously followed by Dr. Daleen Squibb for evaluation of coronary artery disease and chest pain. Patient has had previous PCI of his LAD in 2004 in setting of MI. Patient had PCI of RCA in 2008. Follow-up catheterization in 2008 showed patent stents. Nuclear study December 2011 showed ejection fraction 43%. There was anteroapical infarct with modest peri-infarct ischemia. Treated medically. Not seen since 2011. Patient has dyspnea with more extreme activities but not routine activities. No orthopnea, PND, pedal edema or syncope. He has occasional pain in his right chest. Increases with certain movements. Last 10 seconds and resolves. No radiation or associated symptoms.  Current Outpatient Prescriptions  Medication Sig Dispense Refill  . aspirin 325 MG tablet Take 325 mg by mouth daily.      . clopidogrel (PLAVIX) 75 MG tablet Take 75 mg by mouth daily.      . diazepam (VALIUM) 5 MG tablet Take 5 mg by mouth every 6 (six) hours as needed. (anxiety)    . glipiZIDE (GLUCOTROL) 10 MG tablet Take 10 mg by mouth 2 (two) times daily.      Marland Kitchen lisinopril (PRINIVIL,ZESTRIL) 20 MG tablet Take 20 mg by mouth daily.    Marland Kitchen lovastatin (MEVACOR) 40 MG tablet Take 40 mg by mouth at bedtime.    . metFORMIN (GLUCOPHAGE) 1000 MG tablet Take 1,000 mg by mouth 2 (two) times daily.      . metoprolol (TOPROL-XL) 50 MG 24 hr tablet Take 50 mg by mouth daily.      . nitroGLYCERIN (NITROSTAT) 0.4 MG SL tablet Place 0.4 mg under the tongue every 5 (five) minutes as needed for chest pain. Reported on 12/28/2015     No current facility-administered medications for this visit.    Allergies  Allergen Reactions  . Penicillins Other (See Comments)    Childhood unsure if still allgeric     Past Medical History  Diagnosis Date  . Anxiety   . CAD (coronary artery disease)   . Dextrocardia   . Diabetes mellitus   . Hypertension   . Hyperlipidemia     Past Surgical History  Procedure Laterality Date   . No previous surgery      Social History   Social History  . Marital Status: Single    Spouse Name: N/A  . Number of Children: 0  . Years of Education: HS   Occupational History  . Full time    Social History Main Topics  . Smoking status: Never Smoker   . Smokeless tobacco: Never Used  . Alcohol Use: 0.0 oz/week    0 Standard drinks or equivalent per week     Comment: Occasional  . Drug Use: No     Comment: Former  . Sexual Activity: Not on file   Other Topics Concern  . Not on file   Social History Narrative   Divorced   Gets regular exercise      Patient drinks caffeine occasionally.   Patient is left handed.     Family History  Problem Relation Age of Onset  . Heart attack Mother   . Heart disease Mother   . Diabetes Mother   . Heart attack Father   . Heart disease Father     ROS: no fevers or chills, productive cough, hemoptysis, dysphasia, odynophagia, melena, hematochezia, dysuria, hematuria, rash, seizure activity, orthopnea, PND, pedal edema, claudication. Remaining systems are negative.  Physical Exam:   Blood pressure 142/62, pulse 60, height  (1.778 m),  weight 209 lb 9.6 oz (95.074 kg).  General:  Well developed/well nourished in NAD Skin warm/dry Patient not depressed No peripheral clubbing Back-normal HEENT-normal/normal eyelids Neck supple/normal carotid upstroke bilaterally; no bruits; no JVD; no thyromegaly chest - CTA/ normal expansion CV - RRR/normal S1 and S2; no rubs or gallops;  PMI nondisplaced, 2/6 systolic murmur apex. Abdomen -NT/ND, no HSM, no mass, + bowel sounds, no bruit 2+ femoral pulses, no bruits Ext-no edema, chords, 2+ DP Neuro-grossly nonfocal  ECG 12/01/2015-sinus rhythm, septal infarct.

## 2015-12-28 ENCOUNTER — Ambulatory Visit (INDEPENDENT_AMBULATORY_CARE_PROVIDER_SITE_OTHER): Payer: BLUE CROSS/BLUE SHIELD | Admitting: Neurology

## 2015-12-28 ENCOUNTER — Ambulatory Visit (INDEPENDENT_AMBULATORY_CARE_PROVIDER_SITE_OTHER): Payer: BLUE CROSS/BLUE SHIELD | Admitting: Cardiology

## 2015-12-28 ENCOUNTER — Encounter: Payer: Self-pay | Admitting: Cardiology

## 2015-12-28 VITALS — BP 142/62 | HR 60 | Ht 70.0 in | Wt 209.6 lb

## 2015-12-28 DIAGNOSIS — R011 Cardiac murmur, unspecified: Secondary | ICD-10-CM | POA: Insufficient documentation

## 2015-12-28 DIAGNOSIS — R072 Precordial pain: Secondary | ICD-10-CM | POA: Diagnosis not present

## 2015-12-28 DIAGNOSIS — G5603 Carpal tunnel syndrome, bilateral upper limbs: Secondary | ICD-10-CM

## 2015-12-28 DIAGNOSIS — G5602 Carpal tunnel syndrome, left upper limb: Secondary | ICD-10-CM | POA: Diagnosis not present

## 2015-12-28 DIAGNOSIS — E1142 Type 2 diabetes mellitus with diabetic polyneuropathy: Secondary | ICD-10-CM

## 2015-12-28 DIAGNOSIS — R202 Paresthesia of skin: Secondary | ICD-10-CM

## 2015-12-28 DIAGNOSIS — G5601 Carpal tunnel syndrome, right upper limb: Secondary | ICD-10-CM

## 2015-12-28 DIAGNOSIS — G4700 Insomnia, unspecified: Secondary | ICD-10-CM | POA: Diagnosis not present

## 2015-12-28 DIAGNOSIS — R079 Chest pain, unspecified: Secondary | ICD-10-CM | POA: Insufficient documentation

## 2015-12-28 MED ORDER — ZOLPIDEM TARTRATE 5 MG PO TABS: 5.0000 mg | ORAL_TABLET | Freq: Every evening | ORAL | Status: DC | PRN

## 2015-12-28 MED ORDER — GABAPENTIN 300 MG PO CAPS: 300.0000 mg | ORAL_CAPSULE | Freq: Three times a day (TID) | ORAL | Status: DC

## 2015-12-28 NOTE — Patient Instructions (Signed)
Medication Instructions:   STOP PLAVIX  Testing/Procedures:  Your physician has requested that you have a lexiscan myoview. For further information please visit [email or web address] Please follow instruction sheet, as given.   Your physician has requested that you have an echocardiogram. Echocardiography is a painless test that uses sound waves to create images of your heart. It provides your doctor with information about the size and shape of your heart and how well your heart's chambers and valves are working. This procedure takes approximately one hour. There are no restrictions for this procedure.    Follow-Up:  Your physician wants you to follow-up in: 6 MONTHS WITH DR Jens Som You will receive a reminder letter in the mail two months in advance. If you don't receive a letter, please call our office to schedule the follow-up appointment.   If you need a refill on your cardiac medications before your next appointment, please call your pharmacy.

## 2015-12-28 NOTE — Assessment & Plan Note (Signed)
Plan echocardiogram to further assess. 
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
## 2015-12-28 NOTE — Assessment & Plan Note (Signed)
Continue present blood pressure medications. 
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
## 2015-12-28 NOTE — Assessment & Plan Note (Signed)
Symptoms atypical. Plan nuclear study for risk stratification. 

## 2015-12-28 NOTE — Assessment & Plan Note (Signed)
Continue aspirin and statin. Discontinue Plavix. 

## 2015-12-28 NOTE — Assessment & Plan Note (Signed)
Continue Mevacor.Intolerant to Lipitor previously.

## 2015-12-30 ENCOUNTER — Telehealth: Payer: Self-pay | Admitting: Cardiology

## 2015-12-30 NOTE — Telephone Encounter (Signed)
[name] is calling to see if he was to have a echo schedule for him , Please call   Thanks

## 2016-01-07 ENCOUNTER — Telehealth (HOSPITAL_COMMUNITY): Payer: Self-pay

## 2016-01-07 NOTE — Telephone Encounter (Signed)
Encounter complete. 

## 2016-01-12 ENCOUNTER — Ambulatory Visit (HOSPITAL_COMMUNITY): Payer: BLUE CROSS/BLUE SHIELD | Attending: Cardiology

## 2016-01-12 ENCOUNTER — Inpatient Hospital Stay (HOSPITAL_COMMUNITY): Admission: RE | Admit: 2016-01-12 | Payer: BLUE CROSS/BLUE SHIELD | Source: Ambulatory Visit

## 2016-01-12 ENCOUNTER — Ambulatory Visit (HOSPITAL_BASED_OUTPATIENT_CLINIC_OR_DEPARTMENT_OTHER): Payer: BLUE CROSS/BLUE SHIELD

## 2016-01-12 ENCOUNTER — Other Ambulatory Visit: Payer: Self-pay

## 2016-01-12 DIAGNOSIS — I10 Essential (primary) hypertension: Secondary | ICD-10-CM | POA: Insufficient documentation

## 2016-01-12 DIAGNOSIS — R072 Precordial pain: Secondary | ICD-10-CM

## 2016-01-12 DIAGNOSIS — R011 Cardiac murmur, unspecified: Secondary | ICD-10-CM | POA: Insufficient documentation

## 2016-01-12 DIAGNOSIS — I350 Nonrheumatic aortic (valve) stenosis: Secondary | ICD-10-CM | POA: Diagnosis not present

## 2016-01-12 DIAGNOSIS — I5189 Other ill-defined heart diseases: Secondary | ICD-10-CM | POA: Diagnosis not present

## 2016-01-12 DIAGNOSIS — E785 Hyperlipidemia, unspecified: Secondary | ICD-10-CM | POA: Diagnosis not present

## 2016-01-12 DIAGNOSIS — E119 Type 2 diabetes mellitus without complications: Secondary | ICD-10-CM | POA: Diagnosis not present

## 2016-01-12 MED ORDER — REGADENOSON 0.4 MG/5ML IV SOLN
0.4000 mg | Freq: Once | INTRAVENOUS | Status: AC
  Administered 2016-01-12: 0.4 mg via INTRAVENOUS

## 2016-01-12 MED ORDER — TECHNETIUM TC 99M SESTAMIBI GENERIC - CARDIOLITE
10.0000 | Freq: Once | INTRAVENOUS | Status: AC | PRN
  Administered 2016-01-12: 10 via INTRAVENOUS

## 2016-01-12 MED ORDER — TECHNETIUM TC 99M SESTAMIBI GENERIC - CARDIOLITE
32.7000 | Freq: Once | INTRAVENOUS | Status: AC | PRN
  Administered 2016-01-12: 32.7 via INTRAVENOUS

## 2016-01-13 LAB — MYOCARDIAL PERFUSION IMAGING
CHL CUP NUCLEAR SDS: 0
CHL CUP NUCLEAR SRS: 23
CHL CUP RESTING HR STRESS: 76 {beats}/min
CSEPPHR: 92 {beats}/min
LV dias vol: 108 mL
LV sys vol: 64 mL
NUC STRESS TID: 1.05
RATE: 0.29
SSS: 23

## 2016-02-03 ENCOUNTER — Ambulatory Visit: Payer: BLUE CROSS/BLUE SHIELD | Admitting: Neurology

## 2016-02-10 ENCOUNTER — Telehealth: Payer: Self-pay | Admitting: Neurology

## 2016-02-10 ENCOUNTER — Other Ambulatory Visit: Payer: Self-pay | Admitting: *Deleted

## 2016-02-10 ENCOUNTER — Encounter: Payer: Self-pay | Admitting: *Deleted

## 2016-02-10 ENCOUNTER — Ambulatory Visit (INDEPENDENT_AMBULATORY_CARE_PROVIDER_SITE_OTHER): Payer: BLUE CROSS/BLUE SHIELD

## 2016-02-10 DIAGNOSIS — E1142 Type 2 diabetes mellitus with diabetic polyneuropathy: Secondary | ICD-10-CM | POA: Diagnosis not present

## 2016-02-10 DIAGNOSIS — R202 Paresthesia of skin: Secondary | ICD-10-CM

## 2016-02-10 DIAGNOSIS — G5603 Carpal tunnel syndrome, bilateral upper limbs: Secondary | ICD-10-CM

## 2016-02-10 MED ORDER — TRAMADOL HCL 50 MG PO TABS: 50.0000 mg | ORAL_TABLET | Freq: Four times a day (QID) | ORAL | Status: DC | PRN

## 2016-02-10 NOTE — Telephone Encounter (Signed)
Per Dr. Terrace ArabiaYan, try to get MRI moved to today.  He would like to take Tramadol to see if it will relieve some of his pain - Dr. Terrace ArabiaYan will provide rx. Spoke to [employer]ick[employer] - she is aware of plan. He will be here for his MRI scans at 5:15pm.

## 2016-02-10 NOTE — Telephone Encounter (Signed)
error 

## 2016-02-10 NOTE — Telephone Encounter (Signed)
Rx printed, signed and fax[facility] to [facility] at [phone].

## 2016-02-10 NOTE — Telephone Encounter (Signed)
Pt called stating that he is in terrible pain. His back is hurting him so much he can not get any sleep and it took him 30 min to get dressed this morning. He wants to know if should go to urgent care or come in to see Dr. Terrace ArabiaYan. He is scheduled for a MRI in April. Please call and advise [phone]-Vickie pts sister. Pt does not have his cell phone and is requesting we call Vickie.

## 2016-02-11 ENCOUNTER — Telehealth: Payer: Self-pay | Admitting: Neurology

## 2016-02-11 ENCOUNTER — Ambulatory Visit (INDEPENDENT_AMBULATORY_CARE_PROVIDER_SITE_OTHER): Payer: BLUE CROSS/BLUE SHIELD | Admitting: Neurology

## 2016-02-11 ENCOUNTER — Encounter: Payer: Self-pay | Admitting: Neurology

## 2016-02-11 VITALS — BP 136/73 | HR 86 | Ht 70.5 in | Wt 209.0 lb

## 2016-02-11 DIAGNOSIS — G5603 Carpal tunnel syndrome, bilateral upper limbs: Secondary | ICD-10-CM

## 2016-02-11 DIAGNOSIS — M5442 Lumbago with sciatica, left side: Secondary | ICD-10-CM | POA: Diagnosis not present

## 2016-02-11 DIAGNOSIS — E1142 Type 2 diabetes mellitus with diabetic polyneuropathy: Secondary | ICD-10-CM | POA: Diagnosis not present

## 2016-02-11 DIAGNOSIS — M545 Low back pain, unspecified: Secondary | ICD-10-CM | POA: Insufficient documentation

## 2016-02-11 MED ORDER — ZOLPIDEM TARTRATE 5 MG PO TABS: 5.0000 mg | ORAL_TABLET | Freq: Every evening | ORAL | Status: DC | PRN

## 2016-02-11 MED ORDER — MELOXICAM 7.5 MG PO TABS: 7.5000 mg | ORAL_TABLET | Freq: Two times a day (BID) | ORAL | Status: DC

## 2016-02-11 MED ORDER — TIZANIDINE HCL 2 MG PO TABS: 4.0000 mg | ORAL_TABLET | Freq: Every day | ORAL | Status: DC

## 2016-02-11 NOTE — Progress Notes (Signed)
Chief Complaint  Patient presents with  . Back/Cervical Pain    He would like to review his MRI scans and discuss treatment options.  He has tried gabapentin and Tramadol in the past.      PATIENT: [name] DOB: [birth year]  HISTORICAL  [name] is a 60 years old left-handed male, seen in refer by his primary care physician Dr. Meredith Mody Corrington for evaluation of bilateral hands numbness tingling.  He had a history of hypertension, hyperlipidemia, diabetes since 1998, last A1c was 7.4, coronary artery disease, status post a stent placement. 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  Since 2016, he noticed bilateral hands paresthesia, right worse than left, at night, he woke up he noticed bilateral elbow pain, bilateral hands buring tingling, this happen 5 days in a week, his restless, he has to give up fishing, because of right hand weakness, difficulty for him to knot a bait, he denies significant neck pain, no gait difficulty,  He has left foot drop since his left leg injury in 1973, left lateral leg numbness. He denies significant low back pain  UPDATE Dec 28 2015: He return for electrodiagnostic study today, which showed evidence of chronic bilateral lumbosacral radiculopathy, also evidence of chronic neuropathic changes involving right cervical myotomes, in addition, there is evidence of severe bilateral carpal tunnel syndromes,  He does complains of chronic neck pain, low back pain, I will proceed with MRI of lumbar, MRI of cervical spine,  He woke up multiple times at night because of bilateral hands paresthesia, he has tried gabapentin, complains of worsening depression, he has severe insomnia, I wrote Ambien 5 mg as needed  UPDATE February 11 2016: Patient returned urgently for evaluation of sudden worsening low back pain, he works on a farm, heavy lifting, taking care of horses, since February 09 2016, he experienced severe  low back pain, especially on the left side, radiating towards left lateral thigh, he complains of difficulty sleeping because of the pain,  He denies bilateral upper extremity symptoms, no bowel and bladder incontinence, he could not tolerate gabapentin or tramadol, cause increased anxiety, paranoid ideation, We have personally reviewed MRI of lumbar in March 2017, multilevel degenerative changes, most severe at L4-5, L3-4, with moderate to severe bilateral foraminal stenosis MRI of cervical spine, multilevel degenerative changes, no significant canal stenosis,  REVIEW OF SYSTEMS: Full 14 system review of systems performed and notable only for as above   ALLERGIES: Allergies  Allergen Reactions  . Penicillins Other (See Comments)    Childhood unsure if still allgeric    HOME MEDICATIONS: Current Outpatient Prescriptions  Medication Sig Dispense Refill  . aspirin 325 MG tablet Take 325 mg by mouth daily.      Marland Kitchen DIAZEPAM PO Take 5 mg by mouth 2 (two) times daily as needed (Anxiety).     Marland Kitchen glipiZIDE (GLUCOTROL) 10 MG tablet Take 10 mg by mouth 2 (two) times daily.      Marland Kitchen lisinopril (PRINIVIL,ZESTRIL) 20 MG tablet Take 20 mg by mouth daily.    Marland Kitchen lovastatin (MEVACOR) 40 MG tablet Take 40 mg by mouth at bedtime.    . metFORMIN (GLUCOPHAGE) 1000 MG tablet Take 1,000 mg by mouth 2 (two) times daily.      . metoprolol (TOPROL-XL) 50 MG 24 hr tablet Take 50 mg by mouth daily.      Marland Kitchen zolpidem (AMBIEN) 5 MG tablet Take 1 tablet (5 mg total)  by mouth at bedtime as needed for sleep. 15 tablet 3   No current facility-administered medications for this visit.    PAST MEDICAL HISTORY: Past Medical History  Diagnosis Date  . Anxiety   . CAD (coronary artery disease)   . Dextrocardia   . Diabetes mellitus   . Hypertension   . Hyperlipidemia     PAST SURGICAL HISTORY: Past Surgical History  Procedure Laterality Date  . No previous surgery      FAMILY HISTORY: Family History  Problem  Relation Age of Onset  . Heart attack Mother   . Heart disease Mother   . Diabetes Mother   . Heart attack Father   . Heart disease Father     SOCIAL HISTORY:  Social History   Social History  . Marital Status: Single    Spouse Name: N/A  . Number of Children: 0  . Years of Education: HS   Occupational History  . Full time    Social History Main Topics  . Smoking status: Never Smoker   . Smokeless tobacco: Never Used  . Alcohol Use: 0.0 oz/week    0 Standard drinks or equivalent per week     Comment: Occasional  . Drug Use: No     Comment: Former  . Sexual Activity: Not on file   Other Topics Concern  . Not on file   Social History Narrative   Divorced   Gets regular exercise      Patient drinks caffeine occasionally.   Patient is left handed.      PHYSICAL EXAM   Filed Vitals:   02/11/16 1557  BP: 136/73  Pulse: 86  Height: 5' 10.5" (1.791 m)  Weight: 209 lb (94.802 kg)    Not recorded      Body mass index is 29.55 kg/(m^2).  PHYSICAL EXAMNIATION:  Gen: NAD, conversant, well nourised, obese, well groomed                     Cardiovascular: Regular rate rhythm, no peripheral edema, warm, nontender. Eyes: Conjunctivae clear without exudates or hemorrhage Neck: Supple, no carotid bruise. Pulmonary: Clear to auscultation bilaterally   NEUROLOGICAL EXAM:  MENTAL STATUS: Speech:    Speech is normal; fluent and spontaneous with normal comprehension.  Cognition:     Orientation to time, place and person     Normal recent and remote memory     Normal Attention span and concentration     Normal Language, naming, repeating,spontaneous speech     Fund of knowledge   CRANIAL NERVES: CN II: Visual fields are full to confrontation. Fundoscopic exam is normal with sharp discs and no vascular changes. Pupils are round equal and briskly reactive to light. CN III, IV, VI: extraocular movement are normal. No ptosis. CN V: Facial sensation is intact to  pinprick in all 3 divisions bilaterally. Corneal responses are intact.  CN VII: Face is symmetric with normal eye closure and smile. CN VIII: Hearing is normal to rubbing fingers CN IX, X: Palate elevates symmetrically. Phonation is normal. CN XI: Head turning and shoulder shrug are intact CN XII: Tongue is midline with normal movements and no atrophy.  MOTOR: He has moderate atrophy of right abductor pollicis brevis, no atrophy on the left side, bilateral upper extremity proximal muscle strength is normal, moderate weakness at right abductor pollicis brevis, opponens, no significant weakness at left side, he has moderate left ankle dorsiflexion weakness, bilateral wrist Tinel signs were present.  REFLEXES:  Reflexes are 2+ and symmetric at the biceps, triceps, knees, and ankles. Plantar responses are flexor.  SENSORY: Length dependent decreased light touch vibratory sensation pinprick to ankle level, decreased left lateral leg sensation, decreased bilateral finger pads to pinprick  COORDINATION: Rapid alternating movements and fine finger movements are intact. There is no dysmetria on finger-to-nose and heel-knee-shin.    GAIT/STANCE: Wide-based, mildly unsteady, left foot drop, difficulty perform tiptoe heel walking, had a history of right foot injury   DIAGNOSTIC DATA (LABS, IMAGING, TESTING) - I reviewed patient records, labs, notes, testing and imaging myself where available.   ASSESSMENT AND PLAN  [name] is a 60 y.o. male   Bilateral hands paresthesia  Electrodiagnostic study confirms severe bilateral bilateral carpal tunnel syndromes  He would benefit bilateral carpal tunnel release surgery, I will refer him to orthopedic surgeon  Acute worsening of low back pain, radiating pain to left lower extremity,  Most consistent with a left lumbar radiculopathy  Mobic 7.5 milligrams twice a day  I also suggested heating pad, lumbar brace, back stretching  exercise,  Potentially pain management by orthopedic surgeon, epidural injection  Paresthesia, insomnia  He could not tolerate antidepression, gabapentin, with worsening depression, mood disorder,  Continue Ambien 5 mg as needed   Levert Feinstein, M.D. Ph.D.  [facility] [address], [address] [place], [place] [address] Ph: [phone] Fax: [phone]  CC:Kip York Pellant, MD

## 2016-02-11 NOTE — Telephone Encounter (Signed)
Patient called to advise, he can't take traMADol (ULTRAM) 50 MG tablet "it messes with my head", states he's cancelled this medication at the pharmacy. Would like for Dr. Terrace ArabiaYan to send a Rx for DIAZEPAM to [facility], this medication seems to help him. Patient states since last night he has pain shooting down left leg.
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
## 2016-02-11 NOTE — Telephone Encounter (Signed)
He has requested to be seen - says his pain is significant and would like it to be further evaluated.  He would like to hold off on Mobic until seen.  He has been worked into Dr. Zannie CoveYan's schedule.

## 2016-03-02 ENCOUNTER — Other Ambulatory Visit: Payer: BLUE CROSS/BLUE SHIELD

## 2016-04-26 ENCOUNTER — Ambulatory Visit: Payer: BLUE CROSS/BLUE SHIELD | Admitting: Neurology

## 2016-04-26 ENCOUNTER — Telehealth: Payer: Self-pay | Admitting: *Deleted

## 2016-04-26 NOTE — Telephone Encounter (Signed)
No showed follow up appointment. 

## 2016-04-27 ENCOUNTER — Encounter: Payer: Self-pay | Admitting: Neurology

## 2016-08-22 ENCOUNTER — Other Ambulatory Visit: Payer: Self-pay | Admitting: Neurology

## 2017-06-08 ENCOUNTER — Telehealth: Payer: Self-pay | Admitting: Cardiology

## 2017-06-08 NOTE — Telephone Encounter (Signed)
Close encounter 

## 2017-06-09 ENCOUNTER — Encounter: Payer: Self-pay | Admitting: Cardiology

## 2017-06-09 ENCOUNTER — Ambulatory Visit (INDEPENDENT_AMBULATORY_CARE_PROVIDER_SITE_OTHER): Payer: BLUE CROSS/BLUE SHIELD | Admitting: Cardiology

## 2017-06-09 VITALS — BP 128/70 | HR 64 | Ht 70.5 in | Wt 211.0 lb

## 2017-06-09 DIAGNOSIS — Z01810 Encounter for preprocedural cardiovascular examination: Secondary | ICD-10-CM | POA: Diagnosis not present

## 2017-06-09 DIAGNOSIS — I2510 Atherosclerotic heart disease of native coronary artery without angina pectoris: Secondary | ICD-10-CM | POA: Diagnosis not present

## 2017-06-09 DIAGNOSIS — I350 Nonrheumatic aortic (valve) stenosis: Secondary | ICD-10-CM

## 2017-06-09 NOTE — Patient Instructions (Signed)
Medication Instructions:   NO CHANGE  Testing/Procedures:  Your physician has requested that you have a lexiscan myoview. For further information please visit [email or web address]. Please follow instruction sheet, as given.   Your physician has requested that you have an echocardiogram. Echocardiography is a painless test that uses sound waves to create images of your heart. It provides your doctor with information about the size and shape of your heart and how well your heart's chambers and valves are working. This procedure takes approximately one hour. There are no restrictions for this procedure.    Follow-Up:  Your physician wants you to follow-up in: ONE YEAR WITH DR CRENSHAW You will receive a reminder letter in the mail two months in advance. If you don't receive a letter, please call our office to schedule the follow-up appointment.   If you need a refill on your cardiac medications before your next appointment, please call your pharmacy.    

## 2017-06-09 NOTE — Progress Notes (Signed)
HPI: FU CAD. Patient has had previous PCI of his LAD in 2004 in setting of MI. Patient had PCI of RCA in 2008. Follow-up catheterization in 2008 showed patent stents. Nuclear study February 2017 showed infarct and no ischemia. Ejection fraction 40%. Echocardiogram February 2017 showed ejection fraction 40-45% with akinesis of the apical, anteroseptal myocardium. Mild aortic stenosis (mean gradient 9 mmHg). Since last seen, patient has occasional pain in the right chest area predominantly when using his right arm. He does not have exertional chest pain otherwise. He does have some dyspnea on exertion but no orthopnea, PND or pedal edema. No syncope.  Current Outpatient Prescriptions  Medication Sig Dispense Refill  . aspirin 325 MG tablet Take 325 mg by mouth daily.      . clopidogrel (PLAVIX) 75 MG tablet Take 75 mg by mouth daily.    Marland Kitchen DIAZEPAM PO Take 5 mg by mouth 2 (two) times daily as needed (Anxiety).     Marland Kitchen glipiZIDE (GLUCOTROL) 10 MG tablet Take 10 mg by mouth 2 (two) times daily.      Marland Kitchen lisinopril (PRINIVIL,ZESTRIL) 20 MG tablet Take 20 mg by mouth daily.    Marland Kitchen lovastatin (MEVACOR) 40 MG tablet Take 40 mg by mouth at bedtime.    . metoprolol (TOPROL-XL) 50 MG 24 hr tablet Take 50 mg by mouth daily.      Marland Kitchen SITagliptin-MetFORMIN HCl (JANUMET PO) Take 1 tablet by mouth 2 (two) times daily.     No current facility-administered medications for this visit.      Past Medical History:  Diagnosis Date  . Anxiety   . CAD (coronary artery disease)   . Dextrocardia   . Diabetes mellitus   . Hyperlipidemia   . Hypertension     Past Surgical History:  Procedure Laterality Date  . No previous surgery      Social History   Social History  . Marital status: Single    Spouse name: N/A  . Number of children: 0  . Years of education: HS   Occupational History  . Full time Unemployed   Social History Main Topics  . Smoking status: Never Smoker  . Smokeless tobacco: Never Used   . Alcohol use 0.0 oz/week     Comment: Occasional  . Drug use: No     Comment: Former  . Sexual activity: Not on file   Other Topics Concern  . Not on file   Social History Narrative   Divorced   Gets regular exercise      Patient drinks caffeine occasionally.   Patient is left handed.     Family History  Problem Relation Age of Onset  . Heart attack Mother   . Heart disease Mother   . Diabetes Mother   . Heart attack Father   . Heart disease Father     ROS: no fevers or chills, productive cough, hemoptysis, dysphasia, odynophagia, melena, hematochezia, dysuria, hematuria, rash, seizure activity, orthopnea, PND, pedal edema, claudication. Remaining systems are negative.  Physical Exam: Well-developed well-nourished in no acute distress.  Skin is warm and dry.  HEENT is normal.  Neck is supple.  Chest is clear to auscultation with normal expansion.  Cardiovascular exam is regular rate and rhythm. 2/6 systolic murmur left sternal border. Abdominal exam nontender or distended. No masses palpated. Extremities show no edema. neuro grossly intact  ECG- normal sinus rhythm, cannot rule out prior inferior infarct, probable limb lead reversal. personally reviewed  A/P  1 Coronary artery disease-continue aspirin and statin.  2 preoperative evaluation- patient will require bilateral carpal tunnel surgery. He has dyspnea on exertion. I will arrange a Lexiscan nuclear study for risk stratification and if low risk he may proceed.  3 mild aortic stenosis- plan repeat echocardiogram.  4 hypertension-blood pressure controlled. Continue present medications.   5 hyperlipidemia-continue statin. Note patient was intolerant to Lipitor previously.   Olga MillersBrian Arwa Yero, MD

## 2017-06-16 ENCOUNTER — Telehealth (HOSPITAL_COMMUNITY): Payer: Self-pay

## 2017-06-16 NOTE — Telephone Encounter (Signed)
Encounter complete. 

## 2017-06-19 ENCOUNTER — Ambulatory Visit (HOSPITAL_COMMUNITY): Payer: BLUE CROSS/BLUE SHIELD | Attending: Cardiovascular Disease

## 2017-06-19 ENCOUNTER — Other Ambulatory Visit: Payer: Self-pay

## 2017-06-19 DIAGNOSIS — I350 Nonrheumatic aortic (valve) stenosis: Secondary | ICD-10-CM | POA: Diagnosis not present

## 2017-06-20 ENCOUNTER — Ambulatory Visit (HOSPITAL_COMMUNITY): Payer: BLUE CROSS/BLUE SHIELD | Attending: Cardiology

## 2017-06-20 ENCOUNTER — Ambulatory Visit (HOSPITAL_COMMUNITY)
Admission: RE | Admit: 2017-06-20 | Payer: BLUE CROSS/BLUE SHIELD | Source: Ambulatory Visit | Attending: Cardiology | Admitting: Cardiology

## 2017-06-20 DIAGNOSIS — R9439 Abnormal result of other cardiovascular function study: Secondary | ICD-10-CM | POA: Insufficient documentation

## 2017-06-20 DIAGNOSIS — Z01810 Encounter for preprocedural cardiovascular examination: Secondary | ICD-10-CM | POA: Diagnosis not present

## 2017-06-20 DIAGNOSIS — Q240 Dextrocardia: Secondary | ICD-10-CM | POA: Diagnosis not present

## 2017-06-20 DIAGNOSIS — I10 Essential (primary) hypertension: Secondary | ICD-10-CM | POA: Insufficient documentation

## 2017-06-20 DIAGNOSIS — I350 Nonrheumatic aortic (valve) stenosis: Secondary | ICD-10-CM | POA: Diagnosis not present

## 2017-06-20 DIAGNOSIS — I2510 Atherosclerotic heart disease of native coronary artery without angina pectoris: Secondary | ICD-10-CM | POA: Diagnosis not present

## 2017-06-20 DIAGNOSIS — R0609 Other forms of dyspnea: Secondary | ICD-10-CM | POA: Diagnosis not present

## 2017-06-20 DIAGNOSIS — R079 Chest pain, unspecified: Secondary | ICD-10-CM | POA: Insufficient documentation

## 2017-06-20 LAB — MYOCARDIAL PERFUSION IMAGING
CHL CUP NUCLEAR SRS: 24
CHL CUP NUCLEAR SSS: 26
Peak HR: 86 {beats}/min
RATE: 0.33
Rest HR: 53 {beats}/min
SDS: 2
TID: 0.9

## 2017-06-20 MED ORDER — REGADENOSON 0.4 MG/5ML IV SOLN
0.4000 mg | Freq: Once | INTRAVENOUS | Status: AC
  Administered 2017-06-20: 0.4 mg via INTRAVENOUS

## 2017-06-20 MED ORDER — TECHNETIUM TC 99M TETROFOSMIN IV KIT
29.3000 | PACK | Freq: Once | INTRAVENOUS | Status: AC | PRN
  Administered 2017-06-20: 29.3 via INTRAVENOUS
  Filled 2017-06-20: qty 30

## 2017-06-20 MED ORDER — TECHNETIUM TC 99M TETROFOSMIN IV KIT
11.0000 | PACK | Freq: Once | INTRAVENOUS | Status: AC | PRN
  Administered 2017-06-20: 11 via INTRAVENOUS
  Filled 2017-06-20: qty 11

## 2017-06-24 ENCOUNTER — Other Ambulatory Visit: Payer: Self-pay

## 2017-06-24 ENCOUNTER — Emergency Department (HOSPITAL_COMMUNITY): Payer: BLUE CROSS/BLUE SHIELD

## 2017-06-24 ENCOUNTER — Encounter (HOSPITAL_COMMUNITY): Payer: Self-pay | Admitting: Emergency Medicine

## 2017-06-24 ENCOUNTER — Emergency Department (HOSPITAL_COMMUNITY)
Admission: EM | Admit: 2017-06-24 | Discharge: 2017-06-24 | Disposition: A | Discharge status: Home / Self Care | Payer: BLUE CROSS/BLUE SHIELD | Attending: Emergency Medicine | Admitting: Emergency Medicine

## 2017-06-24 DIAGNOSIS — Z7982 Long term (current) use of aspirin: Secondary | ICD-10-CM | POA: Diagnosis not present

## 2017-06-24 DIAGNOSIS — S20211A Contusion of right front wall of thorax, initial encounter: Secondary | ICD-10-CM

## 2017-06-24 DIAGNOSIS — I10 Essential (primary) hypertension: Secondary | ICD-10-CM | POA: Diagnosis not present

## 2017-06-24 DIAGNOSIS — Y9389 Activity, other specified: Secondary | ICD-10-CM | POA: Diagnosis not present

## 2017-06-24 DIAGNOSIS — E119 Type 2 diabetes mellitus without complications: Secondary | ICD-10-CM | POA: Diagnosis not present

## 2017-06-24 DIAGNOSIS — S301XXA Contusion of abdominal wall, initial encounter: Secondary | ICD-10-CM | POA: Insufficient documentation

## 2017-06-24 DIAGNOSIS — Z79899 Other long term (current) drug therapy: Secondary | ICD-10-CM | POA: Insufficient documentation

## 2017-06-24 DIAGNOSIS — S299XXA Unspecified injury of thorax, initial encounter: Secondary | ICD-10-CM | POA: Diagnosis present

## 2017-06-24 DIAGNOSIS — Z7984 Long term (current) use of oral hypoglycemic drugs: Secondary | ICD-10-CM | POA: Insufficient documentation

## 2017-06-24 DIAGNOSIS — Y999 Unspecified external cause status: Secondary | ICD-10-CM | POA: Diagnosis not present

## 2017-06-24 DIAGNOSIS — I2510 Atherosclerotic heart disease of native coronary artery without angina pectoris: Secondary | ICD-10-CM | POA: Insufficient documentation

## 2017-06-24 DIAGNOSIS — Y92410 Unspecified street and highway as the place of occurrence of the external cause: Secondary | ICD-10-CM | POA: Diagnosis not present

## 2017-06-24 LAB — CBC WITH DIFFERENTIAL/PLATELET
BASOS ABS: 0 10*3/uL (ref 0.0–0.1)
Basophils Relative: 0 %
Eosinophils Absolute: 0.1 10*3/uL (ref 0.0–0.7)
Eosinophils Relative: 2 %
HEMATOCRIT: 41.5 % (ref 39.0–52.0)
HEMOGLOBIN: 13.9 g/dL (ref 13.0–17.0)
LYMPHS PCT: 19 %
Lymphs Abs: 1.2 10*3/uL (ref 0.7–4.0)
MCH: 30.8 pg (ref 26.0–34.0)
MCHC: 33.5 g/dL (ref 30.0–36.0)
MCV: 92 fL (ref 78.0–100.0)
Monocytes Absolute: 0.4 10*3/uL (ref 0.1–1.0)
Monocytes Relative: 7 %
NEUTROS ABS: 4.3 10*3/uL (ref 1.7–7.7)
NEUTROS PCT: 72 %
Platelets: 206 10*3/uL (ref 150–400)
RBC: 4.51 MIL/uL (ref 4.22–5.81)
RDW: 12.7 % (ref 11.5–15.5)
WBC: 6 10*3/uL (ref 4.0–10.5)

## 2017-06-24 LAB — COMPREHENSIVE METABOLIC PANEL
ALT: 23 U/L (ref 17–63)
AST: 26 U/L (ref 15–41)
Albumin: 3.9 g/dL (ref 3.5–5.0)
Alkaline Phosphatase: 50 U/L (ref 38–126)
Anion gap: 9 (ref 5–15)
BILIRUBIN TOTAL: 0.3 mg/dL (ref 0.3–1.2)
BUN: 10 mg/dL (ref 6–20)
CO2: 21 mmol/L — ABNORMAL LOW (ref 22–32)
CREATININE: 0.84 mg/dL (ref 0.61–1.24)
Calcium: 9.1 mg/dL (ref 8.9–10.3)
Chloride: 108 mmol/L (ref 101–111)
GFR calc Af Amer: >60 mL/min (ref >60)
GLUCOSE: 211 mg/dL — AB (ref 65–99)
Potassium: 4.7 mmol/L (ref 3.5–5.1)
Sodium: 138 mmol/L (ref 135–145)
TOTAL PROTEIN: 7 g/dL (ref 6.5–8.1)

## 2017-06-24 LAB — I-STAT CHEM 8, ED
BUN: 11 mg/dL (ref 6–20)
Calcium, Ion: 1.19 mmol/L (ref 1.15–1.40)
Chloride: 107 mmol/L (ref 101–111)
Creatinine, Ser: 0.7 mg/dL (ref 0.61–1.24)
GLUCOSE: 208 mg/dL — AB (ref 65–99)
HCT: 43 % (ref 39.0–52.0)
Hemoglobin: 14.6 g/dL (ref 13.0–17.0)
Potassium: 4.7 mmol/L (ref 3.5–5.1)
SODIUM: 140 mmol/L (ref 135–145)
TCO2: 22 mmol/L (ref 0–100)

## 2017-06-24 MED ORDER — SODIUM CHLORIDE 0.9 % IV BOLUS (SEPSIS)
1000.0000 mL | Freq: Once | INTRAVENOUS | Status: AC
  Administered 2017-06-24: 1000 mL via INTRAVENOUS

## 2017-06-24 MED ORDER — IOPAMIDOL (ISOVUE-300) INJECTION 61%
INTRAVENOUS | Status: AC
  Administered 2017-06-24: 100 mL
  Filled 2017-06-24: qty 100

## 2017-06-24 MED ORDER — HYDROCODONE-ACETAMINOPHEN 5-325 MG PO TABS: 1.0000 | ORAL_TABLET | ORAL | 0 refills | Status: DC | PRN

## 2017-06-24 MED ORDER — TRAMADOL HCL 50 MG PO TABS: 50.0000 mg | ORAL_TABLET | Freq: Four times a day (QID) | ORAL | 0 refills | Status: DC | PRN
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
## 2017-06-28 ENCOUNTER — Telehealth: Payer: Self-pay | Admitting: Cardiology
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
## 2017-06-28 NOTE — Telephone Encounter (Signed)
Spoke with pt, Aware of dr crenshaw's recommendations.  °

## 2017-09-02 DIAGNOSIS — N529 Male erectile dysfunction, unspecified: Secondary | ICD-10-CM | POA: Insufficient documentation

## 2017-09-02 DIAGNOSIS — F411 Generalized anxiety disorder: Secondary | ICD-10-CM | POA: Insufficient documentation

## 2018-04-19 LAB — LIPID PANEL
CHOLESTEROL: 132 (ref 0–200)
HDL: 33 — AB (ref 35–70)
LDL Cholesterol: 44
TRIGLYCERIDES: 221 — AB (ref 40–160)

## 2018-09-07 LAB — HEMOGLOBIN A1C: Hemoglobin A1C: 10.4

## 2018-11-29 ENCOUNTER — Encounter: Payer: Self-pay | Admitting: Family Medicine

## 2018-11-29 ENCOUNTER — Ambulatory Visit (INDEPENDENT_AMBULATORY_CARE_PROVIDER_SITE_OTHER): Payer: PRIVATE HEALTH INSURANCE | Admitting: Family Medicine

## 2018-11-29 VITALS — BP 105/61 | HR 68 | Temp 97.9°F | Resp 16 | Ht 71.75 in | Wt 210.0 lb

## 2018-11-29 DIAGNOSIS — E1142 Type 2 diabetes mellitus with diabetic polyneuropathy: Secondary | ICD-10-CM | POA: Diagnosis not present

## 2018-11-29 DIAGNOSIS — R21 Rash and other nonspecific skin eruption: Secondary | ICD-10-CM

## 2018-11-29 DIAGNOSIS — Z7689 Persons encountering health services in other specified circumstances: Secondary | ICD-10-CM | POA: Diagnosis not present

## 2018-11-29 DIAGNOSIS — G4700 Insomnia, unspecified: Secondary | ICD-10-CM

## 2018-11-29 DIAGNOSIS — I10 Essential (primary) hypertension: Secondary | ICD-10-CM

## 2018-11-29 DIAGNOSIS — IMO0001 Reserved for inherently not codable concepts without codable children: Secondary | ICD-10-CM

## 2018-11-29 DIAGNOSIS — E1165 Type 2 diabetes mellitus with hyperglycemia: Secondary | ICD-10-CM

## 2018-11-29 DIAGNOSIS — Z23 Encounter for immunization: Secondary | ICD-10-CM | POA: Diagnosis not present

## 2018-11-29 DIAGNOSIS — I252 Old myocardial infarction: Secondary | ICD-10-CM

## 2018-11-29 DIAGNOSIS — E7800 Pure hypercholesterolemia, unspecified: Secondary | ICD-10-CM

## 2018-11-29 DIAGNOSIS — F411 Generalized anxiety disorder: Secondary | ICD-10-CM

## 2018-11-29 DIAGNOSIS — I2510 Atherosclerotic heart disease of native coronary artery without angina pectoris: Secondary | ICD-10-CM

## 2018-11-29 DIAGNOSIS — Z955 Presence of coronary angioplasty implant and graft: Secondary | ICD-10-CM

## 2018-11-29 DIAGNOSIS — E663 Overweight: Secondary | ICD-10-CM

## 2018-11-29 LAB — COMPREHENSIVE METABOLIC PANEL
ALT: 60 U/L — AB (ref 0–53)
AST: 37 U/L (ref 0–37)
Albumin: 4.9 g/dL (ref 3.5–5.2)
Alkaline Phosphatase: 50 U/L (ref 39–117)
BILIRUBIN TOTAL: 0.3 mg/dL (ref 0.2–1.2)
BUN: 17 mg/dL (ref 6–23)
CALCIUM: 10.6 mg/dL — AB (ref 8.4–10.5)
CO2: 31 meq/L (ref 19–32)
Chloride: 95 mEq/L — ABNORMAL LOW (ref 96–112)
Creatinine, Ser: 0.85 mg/dL (ref 0.40–1.50)
GFR: 96.35 mL/min (ref >60.00)
GLUCOSE: 298 mg/dL — AB (ref 70–99)
Potassium: 4.6 mEq/L (ref 3.5–5.1)
Sodium: 135 mEq/L (ref 135–145)
Total Protein: 7.9 g/dL (ref 6.0–8.3)

## 2018-11-29 LAB — POCT GLYCOSYLATED HEMOGLOBIN (HGB A1C)
HBA1C, POC (CONTROLLED DIABETIC RANGE): 9.4 % — AB (ref 0.0–7.0)
HEMOGLOBIN A1C: 9.4 % — AB (ref 4.0–5.6)
HbA1c POC (<> result, manual entry): 9.4 % (ref 4.0–5.6)
HbA1c, POC (prediabetic range): 9.4 % — AB (ref 5.7–6.4)

## 2018-11-29 LAB — CBC
HCT: 45.3 % (ref 39.0–52.0)
Hemoglobin: 15.4 g/dL (ref 13.0–17.0)
MCHC: 33.9 g/dL (ref 30.0–36.0)
MCV: 93 fl (ref 78.0–100.0)
PLATELETS: 206 10*3/uL (ref 150.0–400.0)
RBC: 4.87 Mil/uL (ref 4.22–5.81)
RDW: 13.1 % (ref 11.5–15.5)
WBC: 4.6 10*3/uL (ref 4.0–10.5)

## 2018-11-29 LAB — TSH: TSH: 2.32 u[IU]/mL (ref 0.35–4.50)

## 2018-11-29 MED ORDER — SITAGLIP PHOS-METFORMIN HCL ER 100-1000 MG PO TB24: 1.0000 | ORAL_TABLET | Freq: Every day | ORAL | 2 refills | Status: DC

## 2018-11-29 MED ORDER — METFORMIN HCL 1000 MG PO TABS: 1000.0000 mg | ORAL_TABLET | Freq: Two times a day (BID) | ORAL | 2 refills | Status: DC

## 2018-11-29 MED ORDER — LISINOPRIL 20 MG PO TABS: 20.0000 mg | ORAL_TABLET | Freq: Every day | ORAL | 5 refills | Status: DC

## 2018-11-29 MED ORDER — LOVASTATIN 40 MG PO TABS: 40.0000 mg | ORAL_TABLET | Freq: Every day | ORAL | 5 refills | Status: DC

## 2018-11-29 MED ORDER — CLOPIDOGREL BISULFATE 75 MG PO TABS: 75.0000 mg | ORAL_TABLET | Freq: Every day | ORAL | 5 refills | Status: DC

## 2018-11-29 MED ORDER — GLIPIZIDE 10 MG PO TABS: 10.0000 mg | ORAL_TABLET | Freq: Two times a day (BID) | ORAL | 2 refills | Status: DC

## 2018-11-29 MED ORDER — METOPROLOL SUCCINATE ER 25 MG PO TB24: 25.0000 mg | ORAL_TABLET | Freq: Every day | ORAL | 5 refills | Status: DC

## 2018-11-29 MED ORDER — TRAZODONE HCL 50 MG PO TABS: 50.0000 mg | ORAL_TABLET | Freq: Every evening | ORAL | 2 refills | Status: DC | PRN

## 2018-11-29 MED ORDER — PAROXETINE HCL 20 MG PO TABS: 20.0000 mg | ORAL_TABLET | Freq: Every day | ORAL | 2 refills | Status: DC

## 2018-11-29 NOTE — Patient Instructions (Signed)
Start trazodone instead of ambien at night. Start by taking one pill for 3 days and then if not working well enough can take 2 tabs before bed. Helps with sleep. Start paxil daily-- everyday.  Use valium up to every 8 hours for panic.   I have refilled your meds for you.    Follow up in 1 month on start new meds.    Every 3 months on diabetes and hypertension.  We will call you with lab results.    Generalized Anxiety Disorder, Adult Generalized anxiety disorder (GAD) is a mental health disorder. People with this condition constantly worry about everyday events. Unlike normal anxiety, worry related to GAD is not triggered by a specific event. These worries also do not fade or get better with time. GAD interferes with life functions, including relationships, work, and school. GAD can vary from mild to severe. People with severe GAD can have intense waves of anxiety with physical symptoms (panic attacks). What are the causes? The exact cause of GAD is not known. What increases the risk? This condition is more likely to develop in:  Women.  People who have a family history of anxiety disorders.  People who are very shy.  People who experience very stressful life events, such as the death of a loved one.  People who have a very stressful family environment. What are the signs or symptoms? People with GAD often worry excessively about many things in their lives, such as their health and family. They may also be overly concerned about:  Doing well at work.  Being on time.  Natural disasters.  Friendships. Physical symptoms of GAD include:  Fatigue.  Muscle tension or having muscle twitches.  Trembling or feeling shaky.  Being easily startled.  Feeling like your heart is pounding or racing.  Feeling out of breath or like you cannot take a deep breath.  Having trouble falling asleep or staying asleep.  Sweating.  Nausea, diarrhea, or irritable bowel syndrome  (IBS).  Headaches.  Trouble concentrating or remembering facts.  Restlessness.  Irritability. How is this diagnosed? Your health care provider can diagnose GAD based on your symptoms and medical history. You will also have a physical exam. The health care provider will ask specific questions about your symptoms, including how severe they are, when they started, and if they come and go. Your health care provider may ask you about your use of alcohol or drugs, including prescription medicines. Your health care provider may refer you to a mental health specialist for further evaluation. Your health care provider will do a thorough examination and may perform additional tests to rule out other possible causes of your symptoms. To be diagnosed with GAD, a person must have anxiety that:  Is out of his or her control.  Affects several different aspects of his or her life, such as work and relationships.  Causes distress that makes him or her unable to take part in normal activities.  Includes at least three physical symptoms of GAD, such as restlessness, fatigue, trouble concentrating, irritability, muscle tension, or sleep problems. Before your health care provider can confirm a diagnosis of GAD, these symptoms must be present more days than they are not, and they must last for six months or longer. How is this treated? The following therapies are usually used to treat GAD:  Medicine. Antidepressant medicine is usually prescribed for long-term daily control. Antianxiety medicines may be added in severe cases, especially when panic attacks occur.  Talk therapy (  psychotherapy). Certain types of talk therapy can be helpful in treating GAD by providing support, education, and guidance. Options include: ? Cognitive behavioral therapy (CBT). People learn coping skills and techniques to ease their anxiety. They learn to identify unrealistic or negative thoughts and behaviors and to replace them with  positive ones. ? Acceptance and commitment therapy (ACT). This treatment teaches people how to be mindful as a way to cope with unwanted thoughts and feelings. ? Biofeedback. This process trains you to manage your body's response (physiological response) through breathing techniques and relaxation methods. You will work with a therapist while machines are used to monitor your physical symptoms.  Stress management techniques. These include yoga, meditation, and exercise. A mental health specialist can help determine which treatment is best for you. Some people see improvement with one type of therapy. However, other people require a combination of therapies. Follow these instructions at home:  Take over-the-counter and prescription medicines only as told by your health care provider.  Try to maintain a normal routine.  Try to anticipate stressful situations and allow extra time to manage them.  Practice any stress management or self-calming techniques as taught by your health care provider.  Do not punish yourself for setbacks or for not making progress.  Try to recognize your accomplishments, even if they are small.  Keep all follow-up visits as told by your health care provider. This is important. Contact a health care provider if:  Your symptoms do not get better.  Your symptoms get worse.  You have signs of depression, such as: ? A persistently sad, cranky, or irritable mood. ? Loss of enjoyment in activities that used to bring you joy. ? Change in weight or eating. ? Changes in sleeping habits. ? Avoiding friends or family members. ? Loss of energy for normal tasks. ? Feelings of guilt or worthlessness. Get help right away if:  You have serious thoughts about hurting yourself or others. If you ever feel like you may hurt yourself or others, or have thoughts about taking your own life, get help right away. You can go to your nearest emergency department or call:  Your local  emergency services (911 in the [place]  A suicide crisis helpline, such as the National Suicide Prevention Lifeline at (534)880-71061-954 766 4741. This is open 24 hours a day. Summary  Generalized anxiety disorder (GAD) is a mental health disorder that involves worry that is not triggered by a specific event.  People with GAD often worry excessively about many things in their lives, such as their health and family.  GAD may cause physical symptoms such as restlessness, trouble concentrating, sleep problems, frequent sweating, nausea, diarrhea, headaches, and trembling or muscle twitching.  A mental health specialist can help determine which treatment is best for you. Some people see improvement with one type of therapy. However, other people require a combination of therapies. This information is not intended to replace advice given to you by your health care provider. Make sure you discuss any questions you have with your health care provider. Document Released: 03/11/2013 Document Revised: 10/04/2016 Document Reviewed: 10/04/2016 Elsevier Interactive Patient Education  2019 [employer]  Persistent Depressive Disorder  Persistent depressive disorder (PDD) is a mental health condition. PDD causes symptoms of low-level depression for 2 years or longer. It may also be called long-term (chronic) depression or dysthymia. PDD may include episodes of more severe depression that last for about 2 weeks (major depressive disorder or MDD). PDD can affect the way you think,  feel, and sleep. This condition may also affect your relationships. You may be more likely to get sick if you have PDD. Symptoms of PDD occur for most of the day and may include:  Feeling tired (fatigue).  Low energy.  Eating too much or too little.  Sleeping too much or too little.  Feeling restless or agitated.  Feeling hopeless.  Feeling worthless or guilty.  Feeling worried or nervous (anxiety).  Trouble concentrating or  making decisions.  Low self-esteem.  A negative way of looking at things (outlook).  Not being able to have fun or feel pleasure.  Avoiding interacting with people.  Getting angry or annoyed easily (irritability).  Acting aggressive or angry. Follow these instructions at home: Activity  Go back to your normal activities as told by your doctor.  Exercise regularly as told by your doctor. General instructions  Take over-the-counter and prescription medicines only as told by your doctor.  Do not drink alcohol. Or, limit how much alcohol you drink to no more than 1 drink a day for nonpregnant women and 2 drinks a day for men. One drink equals 12 oz of beer, 5 oz of wine, or 1 oz of hard liquor. Alcohol can affect any antidepressant medicines you are taking. Talk with your doctor about your alcohol use.  Eat a healthy diet and get plenty of sleep.  Find activities that you enjoy each day.  Consider joining a support group. Your doctor may be able to suggest a support group.  Keep all follow-up visits as told by your doctor. This is important. Where to find more information [employer] on Mental Illness  www.nami.org [place] [employer] of Mental Health  http://www.maynard.net/ National Suicide Prevention Lifeline  762-599-3089).  This is free, 24-hour help. Contact a doctor if:  Your symptoms get worse.  You have new symptoms.  You have trouble sleeping or doing your daily activities. Get help right away if:  You self-harm.  You have serious thoughts about hurting yourself or others.  You see, hear, taste, smell, or feel things that are not there (hallucinate). This information is not intended to replace advice given to you by your health care provider. Make sure you discuss any questions you have with your health care provider. Document Released: 10/26/2015 Document Revised: 07/08/2016 Document Reviewed: 07/08/2016 Elsevier Interactive Patient Education   2019 [employer].  Please help [place] help you:  We are honored you have chosen [facility] for your Primary Care home. Below you will find basic instructions that you may need to access in the future. Please help [place] help you by reading the instructions, which cover many of the frequent questions we experience.   Prescription refills and request:  -In order to allow more efficient response time, please call your pharmacy for all refills. They will forward the request electronically to [place]. This allows for the quickest possible response. Request left on a nurse line can take longer to refill, since these are checked as time allows between office patients and other phone calls.  - refill request can take up to 3-5 working days to complete.  - If request is sent electronically and request is appropiate, it is usually completed in 1-2 business days.  - all patients will need to be seen routinely for all chronic medical conditions requiring prescription medications (see follow-up below). If you are overdue for follow up on your condition, you will be asked to make an appointment and we will call in enough medication  to cover you until your appointment (up to 30 days).  - all controlled substances will require a face to face visit to request/refill.  - if you desire your prescriptions to go through a new pharmacy, and have an active script at original pharmacy, you will need to call your pharmacy and have scripts transferred to new pharmacy. This is completed between the pharmacy locations and not by your provider.    Results: If any images or labs were ordered, it can take up to 1 week to get results depending on the test ordered and the lab/facility running and resulting the test. - Normal or stable results, which do not need further discussion, may be released to your mychart immediately with attached note to you. A call may not be generated for normal results. Please make certain to sign up for  mychart. If you have questions on how to activate your mychart you can call the front office.  - If your results need further discussion, our office will attempt to contact you via phone, and if unable to reach you after 2 attempts, we will release your abnormal result to your mychart with instructions.  - All results will be automatically released in mychart after 1 week.  - Your provider will provide you with explanation and instruction on all relevant material in your results. Please keep in mind, results and labs may appear confusing or abnormal to the untrained eye, but it does not mean they are actually abnormal for you personally. If you have any questions about your results that are not covered, or you desire more detailed explanation than what was provided, you should make an appointment with your provider to do so.   Our office handles many outgoing and incoming calls daily. If we have not contacted you within 1 week about your results, please check your mychart to see if there is a message first and if not, then contact our office.  In helping with this matter, you help decrease call volume, and therefore allow [place] to be able to respond to patients needs more efficiently.   Acute office visits (sick visit):  An acute visit is intended for a new problem and are scheduled in shorter time slots to allow schedule openings for patients with new problems. This is the appropriate visit to discuss a new problem. Problems will not be addressed by phone call or Echart message. Appointment is needed if requesting treatment. In order to provide you with excellent quality medical care with proper time for you to explain your problem, have an exam and receive treatment with instructions, these appointments should be limited to one new problem per visit. If you experience a new problem, in which you desire to be addressed, please make an acute office visit, we save openings on the schedule to accommodate you.  Please do not save your new problem for any other type of visit, let us take care of it properly and quickly for you.   Follow up visits:  Depending on your condition(s) your provider will need to see you routinely in order to provide you with quality care and prescribe medication(s). Most chronic conditions (Example: hypertension, Diabetes, depression/anxiety... etc), require visits a couple times a year. Your provider will instruct you on proper follow up for your personal medical conditions and history. Please make certain to make follow up appointments for your condition as instructed. Failing to do so could result in lapse in your medication treatment/refills. If you request a refill, and  are overdue to be seen on a condition, we will always provide you with a 30 day script (once) to allow you time to schedule.    Medicare wellness (well visit): - we have a wonderful Nurse Selena Batten), that will meet with you and provide you will yearly medicare wellness visits. These visits should occur yearly (can not be scheduled less than 1 calendar year apart) and cover preventive health, immunizations, advance directives and screenings you are entitled to yearly through your medicare benefits. Do not miss out on your entitled benefits, this is when medicare will pay for these benefits to be ordered for you.  These are strongly encouraged by your provider and is the appropriate type of visit to make certain you are up to date with all preventive health benefits. If you have not had your medicare wellness exam in the last 12 months, please make certain to schedule one by calling the office and schedule your medicare wellness with Selena Batten as soon as possible.   Yearly physical (well visit):  - Adults are recommended to be seen yearly for physicals. Check with your insurance and date of your last physical, most insurances require one calendar year between physicals. Physicals include all preventive health topics, screenings,  medical exam and labs that are appropriate for gender/age and history. You may have fasting labs needed at this visit. This is a well visit (not a sick visit), new problems should not be covered during this visit (see acute visit).  - Pediatric patients are seen more frequently when they are younger. Your provider will advise you on well child visit timing that is appropriate for your their age. - This is not a medicare wellness visit. Medicare wellness exams do not have an exam portion to the visit. Some medicare companies allow for a physical, some do not allow a yearly physical. If your medicare allows a yearly physical you can schedule the medicare wellness with our nurse Selena Batten and have your physical with your provider after, on the same day. Please check with insurance for your full benefits.   Late Policy/No Shows:  - all new patients should arrive 15-30 minutes earlier than appointment to allow [place] time  to  obtain all personal demographics,  insurance information and for you to complete office paperwork. - All established patients should arrive 10-15 minutes earlier than appointment time to update all information and be checked in .  - In our best efforts to run on time, if you are late for your appointment you will be asked to either reschedule or if able, we will work you back into the schedule. There will be a wait time to work you back in the schedule,  depending on availability.  - If you are unable to make it to your appointment as scheduled, please call 24 hours ahead of time to allow [place] to fill the time slot with someone else who needs to be seen. If you do not cancel your appointment ahead of time, you may be charged a no show fee.

## 2018-11-29 NOTE — Progress Notes (Signed)
Patient ID: [name], male  DOB: 1955/12/14, 63 y.o.   MRN: [id number] Patient Care Team    Relationship Specialty Notifications Start End  Natalia LeatherwoodKuneff, Juwan Vences A, DO PCP - General Family Medicine  11/29/18     Chief Complaint  Patient presents with  . Establish Care    diabetes and hyperlidemia. Pt is fasting this morning.    Subjective:  [name] is a 63 y.o.  male present for new patient establishment. All past medical history, surgical history, allergies, family history, immunizations, medications and social history were updated in the electronic medical record today. All recent labs, ED visits and hospitalizations within the last year were reviewed.  Depression/anxiety/insomnia: Patient reports he has been treated for his depression, anxiety and insomnia with Valium 5 mg many years.  He states he used to be treated with Xanax and that medication caused him to have difficulties with memory.  He states he takes his Valium about 3 times a day.  He used to only need the Valium twice a day.  Also has been prescribed Ambien, which she states does not work at all.  He reports having depression and anxiety greater than 15 to 20 years.  Feels his anxiety became much more intense after he found his girlfriend greater than 20 years dead in their bed from COPD complications.  He admits he had problems before she passed away, however she had always been able to calm him down. He states he has a rather routine day.  He does not have much social contact in a day.  He lives by himself.  He reports that he always feels like he "stays on edge.  "He reports when he wakes up he is already feeling on edge and he will take a Valium at that time.  He then has breakfast and goes on a 4 mile walk daily.  For lunch he starts to feel his anxiety sudden and he becomes panicked.  He states when he feels this way he will break out in a sweat, become very fearful and scared.  He states he commonly fears there is  someone in the house with a gun.  He goes to his bedroom gets in his bed and because his sister.  He states the fear is so intense and he just cannot control it.  He denies any known trauma or event experienced in his life that would create such a fear.  He states he did go [facility] a [facility] in the past 6 months, and he did not feel that was helpful for him.  Denies any SI or HI.  RASH:  Patient complains of a rash that is on his bilateral hands, forearms and bilateral feet.  He states he had this rash in the past and seen a dermatologist to provide a cream, which did not initially worked and then injections.  He reports the injections did work.  Diabetes, uncontrolled:  Patient reports he has been a diabetic since 1998.  Reports he had better control of his diabetes at the beginning of last year.  However his medications changed secondary to insurance, and then he lost his insurance altogether.  States he has been taking his glipizide 10 mg twice daily, until he ran out over the weekend.  He is also been taking his metformin 1000 mg twice daily until he ran out yesterday.  Denies numbness, tingling of extremities, hypo/hyperglycemic events or non-healing wounds.   PNA series: Pneumovax 23  03/28/2014--> repeat after August 2020 when he will be [id number] Flu shot: Mr. today 11/29/2018 (recommneded yearly) BMP: 01/30/2018 elevated LFTs; which appeared new Foot exam: Completed today 11/29/2018-referral to podiatry Eye exam: Referral to ophthalmology.  He was advised to ask for out-of-pocket cost for his diabetic eye exam.  He does not believe he has vision insurance. A1c: 09/07/2018 A1c 10.4  Hypertension/CAD/S/p Stent/prior MI: Pt has a significant cardiac history of "anterior MI in 2004 with stents was LAD and then perhaps stents to a branch vessel 2008 (by DR. Renaldo- cardio note 2015)" Pt reports compliance with aspirin 325, Plavix 75 mg, lisinopril-HCTZ 40- 50, lovastatin 40 mg daily,  metoprolol 25 mg daily. blood pressures ranges at home not routinely checked. Patient denies chest pain, shortness of breath or lower extremity edema.  BMP: 01/30/2018 elevated LFTs which were new CBC: 01/30/2018 within normal limits Lipids: 04/19/2018 total cholesterol 132, HDL 33, LDL 55, triglycerides 221 TSH: No current result in the system. Diet: Low-sodium Exercise: Routine exercise daily RF: Hypertension, hyperlipidemia, diabetes, overweight, family history, CAD, MI  NM Heartspect Ph Stress- [facility]-07/10/2014   Other Result Information  Acute Interface, Incoming Rad Results - 07/10/2014 12:16 PM EDT TECHNIQUE: Nuclear pharmacological cardiac perfusion exam: INDICATION: Previous MI and coronary artery disease and dextrocardia TECHNIQUE: The patient was initially injected with 12.9 millicuries of technetium Cardiolite. SPECT imaging of the left ventricle was performed at rest. The patient received an additional 38 millicuries of technetium Cardiolite injected intravenously.  SPECT imaging of the left ventricle was then repeated. Resting ECG revealed normal sinus rhythm and a normal tracing. There were no ischemic ECG changes with Lexi scan infusion. FINDINGS: There is moderate size  fixed defect in the anterior apical region consistent with previous infarct.. No reversible perfusion defects are identified. The patient has dextrocardia so the images are reversed. IMPRESSION: Abnormal cardiac perfusion exam for previous anterior apical infarct with no evidence of reversible ischemia. Prognostically this is an abnormal, but low risk scan IMPRESSION: Left ventricular ejection fraction of 49%. FINDINGS: Dynamic imaging of the left ventricle demonstrates apical hypokinesis. IMPRESSION: Regional wall motion abnormality with mildly depressed left ventricular systolic function.    Depression screen PHQ 2/9 11/29/2018  Decreased Interest 3  Down, Depressed, Hopeless 3  PHQ - 2 Score 6  Altered  sleeping 3  Tired, decreased energy 3  Change in appetite 3  Feeling bad or failure about yourself  3  Trouble concentrating 3  Moving slowly or fidgety/restless 2  Suicidal thoughts 0  PHQ-9 Score 23   GAD 7 : Generalized Anxiety Score 11/29/2018  Nervous, Anxious, on Edge 3  Control/stop worrying 3  Worry too much - different things 3  Trouble relaxing 3  Restless 2  Easily annoyed or irritable 0  Afraid - awful might happen 3  Total GAD 7 Score 17       Fall Risk  11/29/2018  Falls in the past year? 0  Number falls in past yr: 0  Injury with Fall? 0    Immunization History  Administered Date(s) Administered  . Influenza,inj,Quad PF,6+ Mos 11/29/2018  . Influenza,inj,quad, With Preservative 08/30/2017  . Influenza-Unspecified 11/28/2016  . Pneumococcal Polysaccharide-23 03/28/2014  . Tdap 04/16/2015    No exam data present  Past Medical History:  Diagnosis Date  . Alcohol addiction (HCC)   . Anxiety   . Arthritis   . CAD (coronary artery disease)   . Depression   . Dextrocardia   . Diabetes (HCC)   .  Diabetes mellitus   . Hyperlipidemia   . Hypertension    Allergies  Allergen Reactions  . Hydrocodone Other (See Comments)    PT reports  Confusion when tacking Hydrocodone  . Penicillins Other (See Comments)    Childhood unsure if still allgeric   Past Surgical History:  Procedure Laterality Date  . alcohol addiction    . CARDIAC SURGERY    . Heart stint    . No previous surgery     Family History  Problem Relation Age of Onset  . Heart attack Mother   . Heart disease Mother   . Diabetes Mother   . Arthritis Mother   . Cancer Mother   . Hyperlipidemia Mother   . Hypertension Mother   . Breast cancer Mother   . Heart attack Father   . Heart disease Father   . Alcohol abuse Father   . Early death Father   . Arthritis Sister   . Heart disease Sister   . Hyperlipidemia Sister   . Asthma Sister   . COPD Sister    Social History    Socioeconomic History  . Marital status: Single    Spouse name: Not on file  . Number of children: 0  . Years of education: HS  . Highest education level: Not on file  Occupational History  . Occupation: Full time    Employer: UNEMPLOYED  Social Needs  . Financial resource strain: Not on file  . Food insecurity:    Worry: Not on file    Inability: Not on file  . Transportation needs:    Medical: Not on file    Non-medical: Not on file  Tobacco Use  . Smoking status: Never Smoker  . Smokeless tobacco: Never Used  Substance and Sexual Activity  . Alcohol use: Yes    Alcohol/week: 0.0 standard drinks    Comment: Occasional  . Drug use: No    Types: Cocaine, Marijuana    Comment: Former  . Sexual activity: Not on file  Lifestyle  . Physical activity:    Days per week: Not on file    Minutes per session: Not on file  . Stress: Not on file  Relationships  . Social connections:    Talks on phone: Not on file    Gets together: Not on file    Attends religious service: Not on file    Active member of club or organization: Not on file    Attends meetings of clubs or organizations: Not on file    Relationship status: Not on file  . Intimate partner violence:    Fear of current or ex partner: Not on file    Emotionally abused: Not on file    Physically abused: Not on file    Forced sexual activity: Not on file  Other Topics Concern  . Not on file  Social History Narrative   Divorced   Gets regular exercise      Patient drinks caffeine occasionally.   Patient is left handed.    Allergies as of 11/29/2018      Reactions   Hydrocodone Other (See Comments)   PT reports  Confusion when tacking Hydrocodone   Penicillins Other (See Comments)   Childhood unsure if still allgeric      Medication List       Accurate as of November 29, 2018 10:55 AM. Always use your most recent med list.        aspirin 325 MG tablet Take 325  mg by mouth daily.   clopidogrel 75 MG  tablet Commonly known as:  PLAVIX Take 1 tablet (75 mg total) by mouth daily.   diazepam 5 MG tablet Commonly known as:  VALIUM Take 5 mg by mouth every 12 (twelve) hours as needed for anxiety.   glipiZIDE 10 MG tablet Commonly known as:  GLUCOTROL Take 1 tablet (10 mg total) by mouth 2 (two) times daily before a meal.   lisinopril 20 MG tablet Commonly known as:  PRINIVIL,ZESTRIL Take 1 tablet (20 mg total) by mouth daily.   lovastatin 40 MG tablet Commonly known as:  MEVACOR Take 1 tablet (40 mg total) by mouth at bedtime.   metFORMIN 1000 MG tablet Commonly known as:  GLUCOPHAGE Take 1 tablet (1,000 mg total) by mouth 2 (two) times daily with a meal.   metoprolol succinate 25 MG 24 hr tablet Commonly known as:  TOPROL-XL Take 1 tablet (25 mg total) by mouth daily.   SitaGLIPtin-MetFORMIN HCl [phone] MG Tb24 Take 1 tablet by mouth daily.       All past medical history, surgical history, allergies, family history, immunizations andmedications were updated in the EMR today and reviewed under the history and medication portions of their EMR.    Recent Results (from the past 2160 hour(s))  POCT HgB A1C     Status: Abnormal   Collection Time: 11/29/18 10:17 AM  Result Value Ref Range   Hemoglobin A1C 9.4 (A) 4.0 - 5.6 %   HbA1c POC (<> result, manual entry) 9.4 4.0 - 5.6 %   HbA1c, POC (prediabetic range) 9.4 (A) 5.7 - 6.4 %   HbA1c, POC (controlled diabetic range) 9.4 (A) 0.0 - 7.0 %     ROS: 14 pt review of systems performed and negative (unless mentioned in an HPI)  Objective: BP 105/61 (BP Location: Right Arm, Patient Position: Sitting, Cuff Size: Large)   Pulse 68   Temp 97.9 F (36.6 C) (Oral)   Resp 16   Ht 5' 11.75" (1.822 m)   Wt 210 lb (95.3 kg)   SpO2 97%   BMI 28.68 kg/m  Gen: Afebrile. No acute distress. Nontoxic in appearance, well-developed, well-nourished, overweight, pleasant Caucasian male. HENT: AT. [place].  MMM Eyes:Pupils Equal Round  Reactive to light, Extraocular movements intact,  Conjunctiva without redness, discharge or icterus. Neck/lymp/endocrine: Supple, no lymphadenopathy, no thyromegaly CV: RRR 1/6 systolic murmur, no edema, +2/4 P posterior tibialis pulses. no carotid bruits. No JVD. Chest: CTAB, no wheeze, rhonchi or crackles.  Normal respiratory effort.  Good air movement. Abd: Soft.  Overweight. NTND. BS present.  Skin: Red flat annular rashes bilateral hands and feet, no purpura or petechiae. Warm and well-perfused. Skin intact. Neuro/Msk: Normal gait. PERLA. EOMi. Alert. Oriented x3.   Psych: Normal affect, dress and demeanor. Normal speech. Normal thought content and judgment. Diabetic Foot Exam - Simple   Simple Foot Form Diabetic Foot exam was performed with the following findings:  Yes 11/29/2018 11:26 AM  Visual Inspection See comments:  Yes Sensation Testing Intact to touch and monofilament testing bilaterally:  Yes Pulse Check Posterior Tibialis and Dorsalis pulse intact bilaterally:  Yes Comments thickened yellow nails bilateral, callus formation, skin rash      Assessment/plan: KELIJAH MEEK is a 63 y.o. male present for EST- with multiple chronic medical conditions not well controlled. Depression/anxiety/insomnia: -Has a history of a rather significant depression with anxiety with paranoid features.  Options were presented to him today after a lengthy discussion and  he agreed to start an SSRI. -Paxil 20 mg daily with close follow-up in 1 month.  Will increase dose at that time if tolerating. -Start trazodone 50-100 mg nightly. -Continue Valium milligrams 3 times daily as needed.  [place] to continue medication for him.  [place] controlled substance database was reviewed today and appropriate. -Contract will be signed next office visit for benzodiazepine use. -Offered referral to counseling or psychiatry; he declined. -Follow-up 1 month  RASH:  -Refer to dermatology based.  Sounds  like he was getting steroid injections, although uncertain.  Diabetes, uncontrolled:  Patient reports he has been a diabetic since 1998.   -Start Janumet [phone] daily -Continue metformin -New glipizide at lower dose of 10 mg daily-may need to increase back to twice daily in 3 months if needed. PNA series: Pneumovax 23 03/28/2014--> repeat after August 2020 when he will be 79 Flu shot: Mr. today 11/29/2018 (recommneded yearly) BMP: 01/30/2018 elevated LFTs; which appeared new Foot exam: Completed today 11/29/2018-referral to podiatry Eye exam: Referral to ophthalmology.  He was advised to ask for out-of-pocket cost for his diabetic eye exam.  He does not believe he has vision insurance. A1c: 09/07/2018 A1c 10.4 --> 9.4 today -Follow-up 3 months  Hypertension/CAD/S/p Stent/prior MI: Pt has a significant cardiac history of "anterior MI in 2004 with stents was LAD and then perhaps stents to a branch vessel 2008 (by DR. Renaldo- cardio note 2015)" -Continue aspirin 325 mg daily and Plavix 75 mg daily  -Continue metoprolol 25 mg daily  -Lisinopril 40 mg daily --> in 1 month recheck will see if HCTZ portion needs to be added back.   -Continue lovastatin 40 mg daily--> lipid recheck due May 2020 l BMP: 01/30/2018 elevated LFTs which were new CBC: 01/30/2018 within normal limits Lipids: 04/19/2018 total cholesterol 132, HDL 33, LDL 55, triglycerides 221 TSH: No current result in the system. Diet: Low-sodium Exercise: Routine exercise daily CBC, CMP and TSH collected today Referral to cardiology placed to establish his history of MI and stent placement x2; should at least be established with cardiology.  Follow-up in 1 month and started new medications/depression anxiety Return in about 3 months (around 02/28/2019).  For diabetes  Greater than 60 minutes was spent with patient, greater than 50% of that time was spent face-to-face with patient counseling and  coordinating care.    Note is dictated  utilizing voice recognition software. Although note has been proof read prior to signing, occasional typographical errors still can be missed. If any questions arise, please do not hesitate to call for verification.  Electronically signed by: Felix Pacini, DO  Primary Care- [place]

## 2018-11-30 ENCOUNTER — Encounter: Payer: Self-pay | Admitting: Family Medicine

## 2018-11-30 ENCOUNTER — Telehealth: Payer: Self-pay | Admitting: Family Medicine

## 2018-11-30 DIAGNOSIS — L84 Corns and callosities: Secondary | ICD-10-CM

## 2018-11-30 DIAGNOSIS — R21 Rash and other nonspecific skin eruption: Secondary | ICD-10-CM

## 2018-11-30 DIAGNOSIS — E1165 Type 2 diabetes mellitus with hyperglycemia: Secondary | ICD-10-CM

## 2018-11-30 DIAGNOSIS — IMO0001 Reserved for inherently not codable concepts without codable children: Secondary | ICD-10-CM

## 2018-11-30 MED ORDER — LISINOPRIL-HYDROCHLOROTHIAZIDE 20-25 MG PO TABS: 2.0000 | ORAL_TABLET | Freq: Every day | ORAL | 5 refills | Status: DC

## 2018-11-30 MED ORDER — DIAZEPAM 5 MG PO TABS: 5.0000 mg | ORAL_TABLET | Freq: Three times a day (TID) | ORAL | 5 refills | Status: DC | PRN

## 2018-11-30 MED ORDER — GLIPIZIDE 10 MG PO TABS: 10.0000 mg | ORAL_TABLET | Freq: Every day | ORAL | 2 refills | Status: DC

## 2018-11-30 NOTE — Telephone Encounter (Signed)
Advised patient of cholesterol results. Recommend restarting the Lovastatin as directed. Cholesterol level will be rechecked in May 2020. He is agreeable.

## 2018-11-30 NOTE — Telephone Encounter (Signed)
Would you contact the pharmacy to see if his Janumet went through and pt picked up? Need to make sure it was affordable to him before I can instruct him on meds on call back.  Thanks.

## 2018-11-30 NOTE — Telephone Encounter (Signed)
LMOVM for patient to return call for lab results and medication changes. CRM created and ok for Triage Nurse to give PCP recommendations.

## 2018-11-30 NOTE — Telephone Encounter (Signed)
Did he pick up the scripts yet? - if not DC lisinopril and new script sent to reflect what he has been on prior.  - DC metformin- since janumet went through - Change glipizide to QD with use of janumet  Thanks

## 2018-11-30 NOTE — Telephone Encounter (Signed)
Copied from CRM [id number]. Topic: Quick Conservator, museum/gallery Patient (Clinic Use ONLY) >> Nov 30, 2018  2:03 PM Mcneil, Jannifer Rodney wrote: Pt returned call for lab results and medication changes. Pt requests call back. Cb# [phone]

## 2018-11-30 NOTE — Telephone Encounter (Signed)
Pt given results and information per Dr Claiborne Billings; see telephone encounter dated 11/30/2018 at 123; the pt verbalized understand; the pt also inquired about his cholesterol results; spoke with Marylene Land and she does not see that a cholesterol was done; she will create a CRM so that the pt can be contacted; the says that [phone] and a message can be left; will route to office for final disposition and notification of this; unable to chart in result note because no encounter created.

## 2018-11-30 NOTE — Telephone Encounter (Signed)
Please advise of cholesterol level

## 2018-11-30 NOTE — Telephone Encounter (Signed)
We did not check his cholesterol because it was completed May 2019 at his prior PCP. Cholesterol is only checked once a year- and he is on cholesterol medication. We check it around May 2020

## 2018-11-30 NOTE — Telephone Encounter (Addendum)
Please inform patient the following information: 1. The lisinopril that was called in yesterday- please instruct him to take 2 tabs daily- the bottle reads QD (med was documented in system incorrectly). This does not have the HCTZ portion in it. If he has swelling in his extremities without the HCTZ please have call in and we can call in a short term script for him. His prescription as been corrected at the pharmacy so his next bottle with be the same as his prior script.  2. ONLY take glipizide once daily and STOP the metformin since the Janumet was approved.  3. His labs are stable from his prior collections. He does have a mildly elevated liver enzyme (ALT)- this was elevated at his last collection and is improving from then. We will rpt lab at his 1 month follow up to make sure it continues to return to normal.   4. I have placed referrals for dermatology (rash), cardiology (should be at least established with a cardio with h/o stent and MI), Ophthalmology and podiatry for diabetic eye and foot care. They will be calling him to schedule these appts  5. I also called in his valium refill.

## 2018-11-30 NOTE — Telephone Encounter (Signed)
Verified with pharmacy patient was previously taking Lisinopril/HCTZ 20/25 2 tabs daily. Janumet was covered by patient insurance and able to use the coupon. Copay is only $5.

## 2018-11-30 NOTE — Telephone Encounter (Signed)
Spoke to pharmacy, patient DID pick up medications yesterday.  Pharmacy has updated Lisinopril/HCTZ Rx.

## 2018-12-11 ENCOUNTER — Encounter: Payer: Self-pay | Admitting: Family Medicine

## 2018-12-14 NOTE — Telephone Encounter (Signed)
Pt is requesting a referral to a dermatologist for he's skin condition on his legs that he has previously seen [facility] for previously.  Copied from CRM [id number]. Topic: Referral - Request for Referral >> Dec 14, 2018 11:09 AM Wyonia Hough E wrote: Has patient seen PCP for this complaint? Yes mentioned to Dr.  Marland KitchenIf NO, is insurance requiring patient see PCP for this issue before PCP can refer them? Referral for which specialty: Dermatologist  Preferred provider/office: Provider suggestion or [facility] - [place], [place] Reason for referral: skin condition on legs and feet and it is spreading (Pt was using Fluocinonide cream and was getting the liquid form injected from Dr. Vedia Coffer when Pt was seen in 2018)

## 2018-12-17 NOTE — Telephone Encounter (Signed)
Pt called back. °

## 2018-12-17 NOTE — Telephone Encounter (Signed)
Pt.notified

## 2018-12-17 NOTE — Telephone Encounter (Signed)
Tried to contact pt at his home phone number. No answer, will attempt to call him back at a later time.

## 2018-12-17 NOTE — Telephone Encounter (Signed)
Please make pt aware Dermatology placed for him for in network provider for dermatology- preference Roxan Hockey ([place], [place] if available and in network

## 2018-12-18 ENCOUNTER — Encounter: Payer: Self-pay | Admitting: Family Medicine

## 2018-12-21 ENCOUNTER — Telehealth: Payer: Self-pay
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
## 2018-12-24 ENCOUNTER — Ambulatory Visit (INDEPENDENT_AMBULATORY_CARE_PROVIDER_SITE_OTHER): Payer: PRIVATE HEALTH INSURANCE | Admitting: Family Medicine

## 2018-12-24 ENCOUNTER — Encounter: Payer: Self-pay | Admitting: Family Medicine

## 2018-12-24 VITALS — BP 104/59 | HR 63 | Temp 97.4°F | Resp 16 | Ht 71.75 in | Wt 212.0 lb

## 2018-12-24 DIAGNOSIS — S0990XA Unspecified injury of head, initial encounter: Secondary | ICD-10-CM

## 2018-12-24 DIAGNOSIS — H543 Unqualified visual loss, both eyes: Secondary | ICD-10-CM

## 2018-12-24 DIAGNOSIS — Z7901 Long term (current) use of anticoagulants: Secondary | ICD-10-CM

## 2018-12-24 LAB — CBC
HEMATOCRIT: 42.4 % (ref 39.0–52.0)
HEMOGLOBIN: 14.5 g/dL (ref 13.0–17.0)
MCHC: 34.3 g/dL (ref 30.0–36.0)
MCV: 92.3 fl (ref 78.0–100.0)
Platelets: 196 10*3/uL (ref 150.0–400.0)
RBC: 4.59 Mil/uL (ref 4.22–5.81)
RDW: 12.8 % (ref 11.5–15.5)
WBC: 5.7 10*3/uL (ref 4.0–10.5)
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
## 2018-12-25 ENCOUNTER — Ambulatory Visit (HOSPITAL_COMMUNITY)
Admission: RE | Admit: 2018-12-25 | Discharge: 2018-12-25 | Disposition: A | Discharge status: Home / Self Care | Payer: PRIVATE HEALTH INSURANCE | Source: Ambulatory Visit | Attending: Family Medicine | Admitting: Family Medicine

## 2018-12-25 DIAGNOSIS — H543 Unqualified visual loss, both eyes: Secondary | ICD-10-CM | POA: Diagnosis not present

## 2018-12-25 DIAGNOSIS — Z7901 Long term (current) use of anticoagulants: Secondary | ICD-10-CM | POA: Insufficient documentation

## 2018-12-25 DIAGNOSIS — S0990XA Unspecified injury of head, initial encounter: Secondary | ICD-10-CM | POA: Insufficient documentation

## 2018-12-26 ENCOUNTER — Encounter: Payer: Self-pay | Admitting: Family Medicine

## 2018-12-27 ENCOUNTER — Other Ambulatory Visit: Payer: Self-pay | Admitting: Family Medicine

## 2018-12-27 DIAGNOSIS — E1165 Type 2 diabetes mellitus with hyperglycemia: Principal | ICD-10-CM

## 2018-12-27 DIAGNOSIS — IMO0001 Reserved for inherently not codable concepts without codable children: Secondary | ICD-10-CM

## 2018-12-31 ENCOUNTER — Encounter: Payer: Self-pay | Admitting: Family Medicine

## 2018-12-31 ENCOUNTER — Ambulatory Visit (INDEPENDENT_AMBULATORY_CARE_PROVIDER_SITE_OTHER): Payer: PRIVATE HEALTH INSURANCE | Admitting: Family Medicine

## 2018-12-31 VITALS — BP 102/63 | HR 57 | Temp 98.1°F | Resp 16 | Ht 71.0 in | Wt 213.0 lb

## 2018-12-31 DIAGNOSIS — R945 Abnormal results of liver function studies: Secondary | ICD-10-CM | POA: Diagnosis not present

## 2018-12-31 DIAGNOSIS — R7989 Other specified abnormal findings of blood chemistry: Secondary | ICD-10-CM

## 2018-12-31 DIAGNOSIS — F411 Generalized anxiety disorder: Secondary | ICD-10-CM

## 2018-12-31 MED ORDER — PAROXETINE HCL 20 MG PO TABS: 20.0000 mg | ORAL_TABLET | Freq: Every day | ORAL | 5 refills | Status: DC

## 2018-12-31 MED ORDER — TRAZODONE HCL 50 MG PO TABS: 50.0000 mg | ORAL_TABLET | Freq: Every evening | ORAL | 5 refills | Status: DC | PRN

## 2018-12-31 NOTE — Patient Instructions (Addendum)
I am glad you are doing well.  I have refilled your prozac and trazodone for 6 mos.   Take 1.5 of the lisinopril  20 mg - 25 mg - monitor BP and energy.   We will see you April 1 st for your follow up.

## 2018-12-31 NOTE — Progress Notes (Signed)
Patient ID: [name], male  DOB: [birth year], 63 y.o.   MRN: [id number] Patient Care Team    Relationship Specialty Notifications Start End  Natalia Leatherwood, DO PCP - General Family Medicine  11/29/18     Chief Complaint  Patient presents with  . Follow-up    Anxiety, HTN     Subjective:  [name] is a 63 y.o.  male present for  Depression/anxiety/insomnia: Patient reports this is an improvement since the addition of Paxil 20 mg daily.  He states he cannot tolerate the full dose at one time but he is breaking it up in  halves and taking throughout the day.  He no longer is having fear of someone being in his home.  He no longer is having the debilitating anxiety that caused him to go to his room and lay down or call his family members.  He is taking the Valium 5 mg 3 times daily.  He is taking the trazodone 1 to 2 pills nightly and reports he is sleeping much better. Prior note:  Patient reports he has been treated for his depression, anxiety and insomnia with Valium 5 mg many years.  He states he used to be treated with Xanax and that medication caused him to have difficulties with memory.  He states he takes his Valium about 3 times a day.  He used to only need the Valium twice a day.  Also has been prescribed Ambien, which she states does not work at all.  He reports having depression and anxiety greater than 15 to 20 years.  Feels his anxiety became much more intense after he found his girlfriend greater than 20 years dead in their bed from COPD complications.  He admits he had problems before she passed away, however she had always been able to calm him down. He states he has a rather routine day.  He does not have much social contact in a day.  He lives by himself.  He reports that he always feels like he "stays on edge.  "He reports when he wakes up he is already feeling on edge and he will take a Valium at that time.  He then has breakfast and goes on a 4 mile walk daily.   For lunch he starts to feel his anxiety sudden and he becomes panicked.  He states when he feels this way he will break out in a sweat, become very fearful and scared.  He states he commonly fears there is someone in the house with a gun.  He goes to his bedroom gets in his bed and because his sister.  He states the fear is so intense and he just cannot control it.  He denies any known trauma or event experienced in his life that would create such a fear.  He states he did go see a counselor/psychiatrist at [name] in the past 6 months, and he did not feel that was helpful for him.  Denies any SI or HI.    Depression screen PHQ 2/9 11/29/2018  Decreased Interest 3  Down, Depressed, Hopeless 3  PHQ - 2 Score 6  Altered sleeping 3  Tired, decreased energy 3  Change in appetite 3  Feeling bad or failure about yourself  3  Trouble concentrating 3  Moving slowly or fidgety/restless 2  Suicidal thoughts 0  PHQ-9 Score 23   GAD 7 : Generalized Anxiety Score 12/31/2018 11/29/2018  Nervous, Anxious, on Edge 3 3  Control/stop worrying 2 3  Worry too much - different things 2 3  Trouble relaxing 2 3  Restless 1 2  Easily annoyed or irritable 2 0  Afraid - awful might happen 3 3  Total GAD 7 Score 15 17       Fall Risk  11/29/2018  Falls in the past year? 0  Number falls in past yr: 0  Injury with Fall? 0    Immunization History  Administered Date(s) Administered  . Influenza,inj,Quad PF,6+ Mos 11/29/2018  . Influenza,inj,quad, With Preservative 08/30/2017  . Influenza-Unspecified 11/28/2016  . Pneumococcal Polysaccharide-23 03/28/2014  . Tdap 04/16/2015    No exam data present  Past Medical History:  Diagnosis Date  . Alcohol addiction (HCC)   . Anxiety   . Arthritis   . CAD (coronary artery disease)   . Coronary artery disease with hx of myocardial infarct w/o hx of CABG 2004  . Depression   . Dextrocardia   . Diabetes (HCC)   . Heart attack [facility]) 2004   Stent placement 2008    . Hyperlipidemia   . Hypertension   . Insomnia    Allergies  Allergen Reactions  . Hydrocodone Other (See Comments)    PT reports  Confusion when tacking Hydrocodone  . Nsaids     Aspirin/Plavix therapy  . Penicillins Other (See Comments)    Childhood unsure if still allgeric   Past Surgical History:  Procedure Laterality Date  . CORONARY ANGIOPLASTY WITH STENT PLACEMENT  2008   Family History  Problem Relation Age of Onset  . Heart attack Mother   . Heart disease Mother   . Diabetes Mother   . Arthritis Mother   . Hyperlipidemia Mother   . Hypertension Mother   . Breast cancer Mother   . Heart attack Father   . Heart disease Father   . Alcohol abuse Father   . Early death Father   . Arthritis Sister   . Heart disease Sister   . Hyperlipidemia Sister   . Asthma Sister   . COPD Sister   . Diabetes Sister   . Alcohol abuse Sister    Social History   Socioeconomic History  . Marital status: Single    Spouse name: Not on file  . Number of children: 0  . Years of education: HS  . Highest education level: Not on file  Occupational History  . Occupation: Full time    Employer: UNEMPLOYED  Social Needs  . Financial resource strain: Not on file  . Food insecurity:    Worry: Not on file    Inability: Not on file  . Transportation needs:    Medical: Not on file    Non-medical: Not on file  Tobacco Use  . Smoking status: Never Smoker  . Smokeless tobacco: Never Used  Substance and Sexual Activity  . Alcohol use: Yes    Alcohol/week: 0.0 standard drinks    Comment: Occasional  . Drug use: Not Currently    Types: Cocaine, Marijuana    Comment: Former  . Sexual activity: Not Currently    Partners: Female  Lifestyle  . Physical activity:    Days per week: Not on file    Minutes per session: Not on file  . Stress: Not on file  Relationships  . Social connections:    Talks on phone: Not on file    Gets together: Not on file    Attends religious service:  Not on file    Active  member of club or organization: Not on file    Attends meetings of clubs or organizations: Not on file    Relationship status: Not on file  . Intimate partner violence:    Fear of current or ex partner: Not on file    Emotionally abused: Not on file    Physically abused: Not on file    Forced sexual activity: Not on file  Other Topics Concern  . 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 Always use your most recent med list.        aspirin 325 MG tablet Take 325 mg by mouth daily.   clopidogrel 75 MG tablet Commonly known as:  PLAVIX Take 1 tablet (75 mg total) by mouth daily.   diazepam 5 MG tablet Commonly known as:  VALIUM Take 1 tablet (5 mg total) by mouth every 8 (eight) hours as needed for anxiety.   glipiZIDE 10 MG tablet Commonly known as:  GLUCOTROL Take 1 tablet (10 mg total) by mouth daily before breakfast.   lisinopril-hydrochlorothiazide 20-25 MG tablet Commonly known as:  PRINZIDE,ZESTORETIC Take 2 tablets by mouth daily.   lovastatin 40 MG tablet Commonly known as:  MEVACOR Take 1 tablet (40 mg total) by mouth at bedtime.   metoprolol succinate 25 MG 24 hr tablet Commonly known as:  TOPROL-XL Take 1 tablet (25 mg total) by mouth daily.   PARoxetine 20 MG tablet Commonly known as:  PAXIL Take 1 tablet (20 mg total) by mouth daily.   SitaGLIPtin-MetFORMIN HCl [phone] MG Tb24 Take 1 tablet by mouth daily.   traZODone 50 MG tablet Commonly known as:   DESYREL Take 1-2 tablets (50-100 mg total) by mouth at bedtime as needed for sleep.       All past medical history, surgical history, allergies, family history, immunizations andmedications were updated in the EMR today and reviewed under the history and medication portions of their EMR.      ROS: 14 pt review of systems performed and negative (unless mentioned in an HPI)  Objective: BP 102/63 (BP Location: Left Arm, Patient Position: Sitting, Cuff Size: Large)   Pulse (!) 57   Temp 98.1 F (36.7 C) (Oral)   Resp 16   Ht 5\' 11"  (1.803 m)   Wt 213 lb (96.6 kg)   SpO2 95%   BMI 29.71 kg/m  Gen: Afebrile. No acute distress.  HENT: AT. [place].  MMM. Eyes:Pupils Equal Round Reactive to light, Extraocular movements intact,  Conjunctiva without redness, discharge or icterus. Neck/lymp/endocrine: Supple,no ymphadenopathy,no thyromegaly CV: RRR 1/6 SM, no edema Chest: CTAB, no wheeze or crackles Abd: Soft. NTND. BS present Neuro: Normal gait. PERLA. EOMi. Alert. Oriented x3 Psych: Normal affect, dress and demeanor. Normal speech. Normal thought content and judgment.  Assessment/plan: Buckner MaltaRandall C Dauber is a 63 y.o. male present for EST- with multiple chronic medical conditions not well controlled. Depression/anxiety/insomnia: -  Continue Paxil 20 mg daily; refilled for 6 mos.  -continue  trazodone 50-100 mg nightly.refills.  -Continue Valium 5 mg 3 times daily as needed.  Agreed to continue medication for him.  [place] controlled substance database was reviewed.. -Contract will be signed next office visit for benzodiazepine use. -Offered referral to counseling or psychiatry; he declined. - f/u 5 -6 mos with other [facility].    He has follow up February 27, 2019.  Note is dictated utilizing voice recognition software. Although note has been proof read prior to signing, occasional typographical errors still can be missed. If any questions arise, please do not hesitate to call for verification.    Electronically signed by: Felix Pacinienee Kuneff, DO [facility] [place]

## 2019-01-01 ENCOUNTER — Encounter: Payer: Self-pay | Admitting: Family Medicine

## 2019-01-04 ENCOUNTER — Encounter: Payer: Self-pay | Admitting: Family Medicine
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
## 2019-01-08 ENCOUNTER — Ambulatory Visit (INDEPENDENT_AMBULATORY_CARE_PROVIDER_SITE_OTHER): Payer: PRIVATE HEALTH INSURANCE | Admitting: Ophthalmology

## 2019-01-08 ENCOUNTER — Encounter (INDEPENDENT_AMBULATORY_CARE_PROVIDER_SITE_OTHER): Payer: Self-pay | Admitting: Ophthalmology

## 2019-01-08 DIAGNOSIS — H3581 Retinal edema: Secondary | ICD-10-CM

## 2019-01-08 DIAGNOSIS — I10 Essential (primary) hypertension: Secondary | ICD-10-CM | POA: Diagnosis not present

## 2019-01-08 DIAGNOSIS — H02822 Cysts of right lower eyelid: Secondary | ICD-10-CM

## 2019-01-08 DIAGNOSIS — H25813 Combined forms of age-related cataract, bilateral: Secondary | ICD-10-CM

## 2019-01-08 DIAGNOSIS — E113293 Type 2 diabetes mellitus with mild nonproliferative diabetic retinopathy without macular edema, bilateral: Secondary | ICD-10-CM | POA: Diagnosis not present

## 2019-01-08 DIAGNOSIS — H35033 Hypertensive retinopathy, bilateral: Secondary | ICD-10-CM

## 2019-01-08 DIAGNOSIS — F0781 Postconcussional syndrome: Secondary | ICD-10-CM | POA: Diagnosis not present

## 2019-01-08 LAB — HM DIABETES EYE EXAM

## 2019-01-09 ENCOUNTER — Encounter: Payer: Self-pay | Admitting: Family Medicine

## 2019-01-09 ENCOUNTER — Ambulatory Visit: Payer: BLUE CROSS/BLUE SHIELD | Admitting: Podiatry

## 2019-01-09 DIAGNOSIS — E113299 Type 2 diabetes mellitus with mild nonproliferative diabetic retinopathy without macular edema, unspecified eye: Secondary | ICD-10-CM | POA: Insufficient documentation

## 2019-01-24 ENCOUNTER — Ambulatory Visit: Payer: PRIVATE HEALTH INSURANCE | Admitting: Interventional Cardiology

## 2019-02-06 ENCOUNTER — Ambulatory Visit: Payer: BLUE CROSS/BLUE SHIELD | Admitting: Podiatry

## 2019-02-12 ENCOUNTER — Ambulatory Visit (INDEPENDENT_AMBULATORY_CARE_PROVIDER_SITE_OTHER): Payer: PRIVATE HEALTH INSURANCE | Admitting: Family Medicine

## 2019-02-12 ENCOUNTER — Other Ambulatory Visit: Payer: Self-pay

## 2019-02-12 ENCOUNTER — Encounter: Payer: Self-pay | Admitting: Family Medicine

## 2019-02-12 VITALS — BP 124/68 | HR 60 | Temp 98.1°F | Resp 16 | Ht 71.5 in | Wt 214.5 lb

## 2019-02-12 DIAGNOSIS — E7800 Pure hypercholesterolemia, unspecified: Secondary | ICD-10-CM

## 2019-02-12 DIAGNOSIS — E113293 Type 2 diabetes mellitus with mild nonproliferative diabetic retinopathy without macular edema, bilateral: Secondary | ICD-10-CM

## 2019-02-12 DIAGNOSIS — G4700 Insomnia, unspecified: Secondary | ICD-10-CM | POA: Diagnosis not present

## 2019-02-12 DIAGNOSIS — Z125 Encounter for screening for malignant neoplasm of prostate: Secondary | ICD-10-CM | POA: Diagnosis not present

## 2019-02-12 DIAGNOSIS — Z955 Presence of coronary angioplasty implant and graft: Secondary | ICD-10-CM

## 2019-02-12 DIAGNOSIS — F411 Generalized anxiety disorder: Secondary | ICD-10-CM

## 2019-02-12 DIAGNOSIS — I10 Essential (primary) hypertension: Secondary | ICD-10-CM

## 2019-02-12 DIAGNOSIS — E1165 Type 2 diabetes mellitus with hyperglycemia: Secondary | ICD-10-CM

## 2019-02-12 DIAGNOSIS — I2510 Atherosclerotic heart disease of native coronary artery without angina pectoris: Secondary | ICD-10-CM

## 2019-02-12 DIAGNOSIS — Z23 Encounter for immunization: Secondary | ICD-10-CM | POA: Diagnosis not present

## 2019-02-12 DIAGNOSIS — Z1211 Encounter for screening for malignant neoplasm of colon: Secondary | ICD-10-CM

## 2019-02-12 DIAGNOSIS — IMO0001 Reserved for inherently not codable concepts without codable children: Secondary | ICD-10-CM

## 2019-02-12 DIAGNOSIS — E663 Overweight: Secondary | ICD-10-CM

## 2019-02-12 DIAGNOSIS — Z0000 Encounter for general adult medical examination without abnormal findings: Secondary | ICD-10-CM | POA: Diagnosis not present

## 2019-02-12 DIAGNOSIS — Z114 Encounter for screening for human immunodeficiency virus [HIV]: Secondary | ICD-10-CM

## 2019-02-12 DIAGNOSIS — I252 Old myocardial infarction: Secondary | ICD-10-CM

## 2019-02-12 LAB — COMPREHENSIVE METABOLIC PANEL
ALT: 50 U/L (ref 0–53)
AST: 41 U/L — ABNORMAL HIGH (ref 0–37)
Albumin: 4.6 g/dL (ref 3.5–5.2)
Alkaline Phosphatase: 45 U/L (ref 39–117)
BUN: 12 mg/dL (ref 6–23)
CO2: 32 mEq/L (ref 19–32)
Calcium: 9.9 mg/dL (ref 8.4–10.5)
Chloride: 96 mEq/L (ref 96–112)
Creatinine, Ser: 0.84 mg/dL (ref 0.40–1.50)
GFR: 91.84 mL/min (ref >60.00)
Glucose, Bld: 233 mg/dL — ABNORMAL HIGH (ref 70–99)
POTASSIUM: 4.5 meq/L (ref 3.5–5.1)
Sodium: 135 mEq/L (ref 135–145)
Total Bilirubin: 0.3 mg/dL (ref 0.2–1.2)
Total Protein: 7.7 g/dL (ref 6.0–8.3)

## 2019-02-12 LAB — LIPID PANEL
Cholesterol: 149 mg/dL (ref 0–200)
HDL: 39.1 mg/dL (ref >39.00)
NonHDL: 109.62
TRIGLYCERIDES: 216 mg/dL — AB (ref 0.0–149.0)
Total CHOL/HDL Ratio: 4
VLDL: 43.2 mg/dL — ABNORMAL HIGH (ref 0.0–40.0)

## 2019-02-12 LAB — LDL CHOLESTEROL, DIRECT: Direct LDL: 87 mg/dL

## 2019-02-12 LAB — HEMOGLOBIN A1C: Hgb A1c MFr Bld: 9.7 % — ABNORMAL HIGH (ref 4.6–6.5)

## 2019-02-12 LAB — PSA: PSA: 1.07 ng/mL (ref 0.10–4.00)

## 2019-02-12 MED ORDER — SITAGLIP PHOS-METFORMIN HCL ER 100-1000 MG PO TB24: 1.0000 | ORAL_TABLET | Freq: Every day | ORAL | 2 refills | Status: DC

## 2019-02-12 MED ORDER — LISINOPRIL-HYDROCHLOROTHIAZIDE 20-25 MG PO TABS: 2.0000 | ORAL_TABLET | Freq: Every day | ORAL | 5 refills | Status: DC

## 2019-02-12 MED ORDER — GLIPIZIDE 10 MG PO TABS: 10.0000 mg | ORAL_TABLET | Freq: Every day | ORAL | 2 refills | Status: DC

## 2019-02-12 MED ORDER — METOPROLOL SUCCINATE ER 25 MG PO TB24: 25.0000 mg | ORAL_TABLET | Freq: Every day | ORAL | 5 refills | Status: DC

## 2019-02-12 MED ORDER — LOVASTATIN 40 MG PO TABS: 40.0000 mg | ORAL_TABLET | Freq: Every day | ORAL | 5 refills | Status: DC
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
## 2019-02-12 NOTE — Patient Instructions (Signed)
Aleve twice a day, stretches and heat for back. If not netter in 2 weeks then please be seen for evaluation on your back.    Health Maintenance, Male A healthy lifestyle and preventive care is important for your health and wellness. Ask your health care provider about what schedule of regular examinations is right for you. What should I know about weight and diet? Eat a Healthy Diet  Eat plenty of vegetables, fruits, whole grains, low-fat dairy products, and lean protein.  Do not eat a lot of foods high in solid fats, added sugars, or salt.  Maintain a Healthy Weight Regular exercise can help you achieve or maintain a healthy weight. You should:  Do at least 150 minutes of exercise each week. The exercise should increase your heart rate and make you sweat (moderate-intensity exercise).  Do strength-training exercises at least twice a week. Watch Your Levels of Cholesterol and Blood Lipids  Have your blood tested for lipids and cholesterol every 5 years starting at 65 years of age. If you are at high risk for heart disease, you should start having your blood tested when you are 65 years old. You may need to have your cholesterol levels checked more often if: ? Your lipid or cholesterol levels are high. ? You are older than 65 years of age. ? You are at high risk for heart disease. What should I know about cancer screening? Many types of cancers can be detected early and may often be prevented. Lung Cancer  You should be screened every year for lung cancer if: ? You are a current smoker who has smoked for at least 30 years. ? You are a former smoker who has quit within the past 15 years.  Talk to your health care provider about your screening options, when you should start screening, and how often you should be screened. Colorectal Cancer  Routine colorectal cancer screening usually begins at 65 years of age and should be repeated every 5-10 years until you are 65 years old. You may  need to be screened more often if early forms of precancerous polyps or small growths are found. Your health care provider may recommend screening at an earlier age if you have risk factors for colon cancer.  Your health care provider may recommend using home test kits to check for hidden blood in the stool.  A small camera at the end of a tube can be used to examine your colon (sigmoidoscopy or colonoscopy). This checks for the earliest forms of colorectal cancer. Prostate and Testicular Cancer  Depending on your age and overall health, your health care provider may do certain tests to screen for prostate and testicular cancer.  Talk to your health care provider about any symptoms or concerns you have about testicular or prostate cancer. Skin Cancer  Check your skin from head to toe regularly.  Tell your health care provider about any new moles or changes in moles, especially if: ? There is a change in a mole's size, shape, or color. ? You have a mole that is larger than a pencil eraser.  Always use sunscreen. Apply sunscreen liberally and repeat throughout the day.  Protect yourself by wearing long sleeves, pants, a wide-brimmed hat, and sunglasses when outside. What should I know about heart disease, diabetes, and high blood pressure?  If you are 55-43 years of age, have your blood pressure checked every 3-5 years. If you are 52 years of age or older, have your blood pressure checked  every year. You should have your blood pressure measured twice-once when you are at a hospital or clinic, and once when you are not at a hospital or clinic. Record the average of the two measurements. To check your blood pressure when you are not at a hospital or clinic, you can use: ? An automated blood pressure machine at a pharmacy. ? A home blood pressure monitor.  Talk to your health care provider about your target blood pressure.  If you are between 86-27 years old, ask your health care provider if  you should take aspirin to prevent heart disease.  Have regular diabetes screenings by checking your fasting blood sugar level. ? If you are at a normal weight and have a low risk for diabetes, have this test once every three years after the age of 69. ? If you are overweight and have a high risk for diabetes, consider being tested at a younger age or more often.  A one-time screening for abdominal aortic aneurysm (AAA) by ultrasound is recommended for men aged 65-75 years who are current or former smokers. What should I know about preventing infection? Hepatitis B If you have a higher risk for hepatitis B, you should be screened for this virus. Talk with your health care provider to find out if you are at risk for hepatitis B infection. Hepatitis C Blood testing is recommended for:  Everyone born from [birth year] through 1967.  Anyone with known risk factors for hepatitis C. Sexually Transmitted Diseases (STDs)  You should be screened each year for STDs including gonorrhea and chlamydia if: ? You are sexually active and are younger than 65 years of age. ? You are older than 65 years of age and your health care provider tells you that you are at risk for this type of infection. ? Your sexual activity has changed since you were last screened and you are at an increased risk for chlamydia or gonorrhea. Ask your health care provider if you are at risk.  Talk with your health care provider about whether you are at high risk of being infected with HIV. Your health care provider may recommend a prescription medicine to help prevent HIV infection. What else can I do?  Schedule regular health, dental, and eye exams.  Stay current with your vaccines (immunizations).  Do not use any tobacco products, such as cigarettes, chewing tobacco, and e-cigarettes. If you need help quitting, ask your health care provider.  Limit alcohol intake to no more than 2 drinks per day. One drink equals 12 ounces of beer,  5 ounces of wine, or 1 ounces of hard liquor.  Do not use street drugs.  Do not share needles.  Ask your health care provider for help if you need support or information about quitting drugs.  Tell your health care provider if you often feel depressed.  Tell your health care provider if you have ever been abused or do not feel safe at home. This information is not intended to replace advice given to you by your health care provider. Make sure you discuss any questions you have with your health care provider. Document Released: 05/12/2008 Document Revised: 07/13/2016 Document Reviewed: 08/18/2015 Elsevier Interactive Patient Education  2019 [employer].

## 2019-02-13 ENCOUNTER — Other Ambulatory Visit: Payer: Self-pay | Admitting: Family Medicine

## 2019-02-13 DIAGNOSIS — E1165 Type 2 diabetes mellitus with hyperglycemia: Principal | ICD-10-CM

## 2019-02-13 DIAGNOSIS — IMO0001 Reserved for inherently not codable concepts without codable children: Secondary | ICD-10-CM

## 2019-02-13 LAB — HIV ANTIBODY (ROUTINE TESTING W REFLEX): HIV 1&2 Ab, 4th Generation: NONREACTIVE

## 2019-02-13 MED ORDER — OMEGA 3 1000 MG PO CAPS: 1.0000 | ORAL_CAPSULE | Freq: Every day | ORAL | 11 refills | Status: DC

## 2019-02-13 MED ORDER — GLIPIZIDE 10 MG PO TABS: 20.0000 mg | ORAL_TABLET | Freq: Every day | ORAL | 2 refills | Status: DC

## 2019-02-13 NOTE — Telephone Encounter (Signed)
Called patient and gave him lab results, pt verbalizes understanding but states he is taking Janumet qd, but has been taking the Glipizide 10mg , BID and forgot at time of last appt. Please advise.

## 2019-02-13 NOTE — Telephone Encounter (Signed)
Per Dr Claiborne Billings pt is to continue taking how is taking and try to increase his exercise like he was doing before. Pt verbalized understanding to take Janumet qd, Glipizide two pills daily (20mg  total daily), and fish oil daily.

## 2019-02-13 NOTE — Telephone Encounter (Signed)
Please inform patient the following information: His a1c went up despite adding the new med. Please make sure pt is taking janumet (sitagliptin-metformin combo) once a day and glipizide 10 mg QD.  - If he states he is taking these--> then we need to increase his glipizide again to BID (make med change in list please).   - His cholesterol panel looked good overall. His triglycerides were only mildly elevated, the rest of the panel is great. Continue the lovastatin and I add a fish oil supplement (prescribed)--> to focus on triglycerides.

## 2019-02-27 ENCOUNTER — Ambulatory Visit: Payer: PRIVATE HEALTH INSURANCE | Admitting: Family Medicine

## 2019-02-28 LAB — COLOGUARD: COLOGUARD: NEGATIVE

## 2019-03-20 NOTE — Progress Notes (Signed)
Virtual Visit via Phone Note as pt did not have a smart phone   This visit type was conducted due to national recommendations for restrictions regarding the COVID-19 Pandemic (e.g. social distancing) in an effort to limit this patient's exposure and mitigate transmission in our community.  Due to his co-morbid illnesses, this patient is at least at moderate risk for complications without adequate follow up.  This format is felt to be most appropriate for this patient at this time.  All issues noted in this document were discussed and addressed.  A limited physical exam was performed with this format.  Please refer to the patient's chart for his consent to telehealth for [facility].   Evaluation Performed:  Follow-up visit  Date:  03/25/2019   ID:  [name], DOB [birth year], MRN [id number]  Patient Location: Home Provider Location: Home  PCP:  [name]  Cardiologist:  Dr Jens Somrenshaw  Chief Complaint:  FU CAD  History of Present Illness:    FU CAD. Patient has had previous PCI of his LAD in 2004 in setting of MI. Patient had PCI of RCA in 2008. Follow-up catheterization in 2008 showed patent stents. Last echocardiogram July 2018 showed ejection fraction 45 to 50%, mild aortic stenosis with mean gradient 9 mmHg and mild right ventricular enlargement.  Nuclear study July 2018 showed LAD infarct but no ischemia.  Since last seen, pt has occasional nonexertional CP unchanged compared to previous; can last all day at times; no exertional CP; no DOE; no syncope.  The patient does not have symptoms concerning for COVID-19 infection (fever, chills, cough, or new shortness of breath).    Past Medical History:  Diagnosis Date  . Alcohol addiction (HCC)   . Anxiety   . Arthritis   . CAD (coronary artery disease)   . Coronary artery disease with hx of myocardial infarct w/o hx of CABG 2004  . Depression   . Dextrocardia   . Diabetes (HCC)   . Heart attack [facility](HCC) 2004   Stent  placement 2008  . Hyperlipidemia   . Hypertension   . Insomnia    Past Surgical History:  Procedure Laterality Date  . CORONARY ANGIOPLASTY WITH STENT PLACEMENT  2008     Current Meds  Medication Sig  . aspirin 325 MG tablet Take 325 mg by mouth daily.    . clopidogrel (PLAVIX) 75 MG tablet Take 1 tablet (75 mg total) by mouth daily.  . diazepam (VALIUM) 5 MG tablet Take 1 tablet (5 mg total) by mouth every 8 (eight) hours as needed for anxiety.  Marland Kitchen. glipiZIDE (GLUCOTROL) 10 MG tablet Take 2 tablets (20 mg total) by mouth daily.  Marland Kitchen. lisinopril-hydrochlorothiazide (PRINZIDE,ZESTORETIC) 20-25 MG tablet Take 2 tablets by mouth daily.  Marland Kitchen. lovastatin (MEVACOR) 40 MG tablet Take 1 tablet (40 mg total) by mouth at bedtime.  . metoprolol succinate (TOPROL-XL) 25 MG 24 hr tablet Take 1 tablet (25 mg total) by mouth daily.  . Omega 3 1000 MG CAPS Take 1 capsule (1,000 mg total) by mouth daily.  Marland Kitchen. PARoxetine (PAXIL) 20 MG tablet Take 1 tablet (20 mg total) by mouth daily.  . SitaGLIPtin-MetFORMIN HCl [id number] MG TB24 Take 1 tablet by mouth daily.  . traZODone (DESYREL) 50 MG tablet Take 1-2 tablets (50-100 mg total) by mouth at bedtime as needed for sleep.     Allergies:   Hydrocodone; Nsaids; and Penicillins   Social History   Tobacco Use  . Smoking status: Never  Smoker  . Smokeless tobacco: Never Used  Substance Use Topics  . Alcohol use: Yes    Alcohol/week: 0.0 standard drinks    Comment: Occasional  . Drug use: Not Currently    Types: Cocaine, Marijuana    Comment: Former     Family Hx: The patient's family history includes Alcohol abuse in his father and sister; Arthritis in his mother and sister; Asthma in his sister; Breast cancer in his mother; COPD in his sister; Diabetes in his mother and sister; Early death in his father; Heart attack in his father and mother; Heart disease in his father, mother, and sister; Hyperlipidemia in his mother and sister; Hypertension in his mother.   ROS:   Please see the history of present illness.    Patient denies fevers, chills or productive cough. Complains of some memory loss All other systems reviewed and are negative.   Recent Labs: 11/29/2018: TSH 2.32 12/24/2018: Hemoglobin 14.5; Platelets 196.0 02/12/2019: ALT 50; BUN 12; Creatinine, Ser 0.84; Potassium 4.5; Sodium 135   Recent Lipid Panel Lab Results  Component Value Date/Time   CHOL 149 02/12/2019 09:17 AM   TRIG 216.0 (H) 02/12/2019 09:17 AM   HDL 39.10 02/12/2019 09:17 AM   CHOLHDL 4 02/12/2019 09:17 AM   LDLCALC 44 04/19/2018   LDLDIRECT 87.0 02/12/2019 09:17 AM    Wt Readings from Last 3 Encounters:  03/25/19 210 lb (95.3 kg)  02/12/19 214 lb 8 oz (97.3 kg)  12/31/18 213 lb (96.6 kg)     Objective:    Vital Signs:  BP 123/63   Pulse (!) 58   Ht 5\' 11"  (1.803 m)   Wt 210 lb (95.3 kg)   BMI 29.29 kg/m    VITAL SIGNS:  reviewed  Answers questions appropriately  ASSESSMENT & PLAN:    1. Coronary artery disease-plan to continue aspirin (change to 81 mg daily) and statin.  DC plavix. 2. Hypertension-patient's blood pressure is controlled.  Continue present medications and follow. 3. Hyperlipidemia-recent LDL greater than 70.  He did not tolerate Lipitor previously.  I will discontinue lovastatin and try Crestor 20 mg daily.  Check lipids and liver in 6 weeks.  If he develops myalgias we can transition back to lovastatin in the future. 4. Mild aortic stenosis-will repeat echo at next ov in 6 months 5. CP-unchanged; last nuclear study showed no ischemia; will not pursue further evaluation at this point.  COVID-19 Education: The importance of social distancing was discussed today.  Time:   Today, I have spent 15 minutes with the patient with telehealth technology discussing the above problems.     Medication Adjustments/Labs and Tests Ordered: Current medicines are reviewed at length with the patient today.  Concerns regarding medicines are outlined  above.   Tests Ordered: No orders of the defined types were placed in this encounter.   Medication Changes: No orders of the defined types were placed in this encounter.   Disposition:  Follow up 6 months  Signed, Olga Millers, MD  03/25/2019 8:15 AM    [facility]

## 2019-03-21 ENCOUNTER — Telehealth: Payer: Self-pay

## 2019-03-21 NOTE — Telephone Encounter (Signed)
Virtual Visit Pre-Appointment Phone Call  "(Name), I am calling you today to discuss your upcoming appointment. We are currently trying to limit exposure to the virus that causes COVID-19 by seeing patients at home rather than in the office."  1. "What is the BEST phone number to call the day of the visit?" - include this in appointment notes  2. "Do you have or have access to (through a family member/friend) a smartphone with video capability that we can use for your visit?" a. If yes - list this number in appt notes as "cell" (if different from BEST phone #) and list the appointment type as a VIDEO visit in appointment notes b. If no - list the appointment type as a PHONE visit in appointment notes  3. Confirm consent - "In the setting of the current Covid19 crisis, you are scheduled for a TELEPHONE visit with Dr. Jens Som on 03/25/2019 at 8:20AM.  Just as we do with many in-office visits, in order for you to participate in this visit, we must obtain consent.  If you'd like, I can send this to your mychart (if signed up) or email for you to review.  Otherwise, I can obtain your verbal consent now.  All virtual visits are billed to your insurance company just like a normal visit would be.  By agreeing to a virtual visit, we'd like you to understand that the technology does not allow for your provider to perform an examination, and thus may limit your provider's ability to fully assess your condition. If your provider identifies any concerns that need to be evaluated in person, we will make arrangements to do so.  Finally, though the technology is pretty good, we cannot assure that it will always work on either your or our end, and in the setting of a video visit, we may have to convert it to a phone-only visit.  In either situation, we cannot ensure that we have a secure connection.  Are you willing to proceed?" STAFF: Did the patient verbally acknowledge consent to telehealth visit? Document YES/NO  here: YES  4. Advise patient to be prepared - "Two hours prior to your appointment, go ahead and check your blood pressure, pulse, oxygen saturation, and your weight (if you have the equipment to check those) and write them all down. When your visit starts, your provider will ask you for this information. If you have an Apple Watch or Kardia device, please plan to have heart rate information ready on the day of your appointment. Please have a pen and paper handy nearby the day of the visit as well."  5. Give patient instructions for MyChart download to smartphone OR Doximity/Doxy.me as below if video visit (depending on what platform provider is using)  6. Inform patient they will receive a phone call 15 minutes prior to their appointment time (may be from unknown caller ID) so they should be prepared to answer    TELEPHONE CALL NOTE  [name] has been deemed a candidate for a follow-up tele-health visit to limit community exposure during the Covid-19 pandemic. I spoke with the patient via phone to ensure availability of phone/video source, confirm preferred email & phone number, and discuss instructions and expectations.  I reminded [name] to be prepared with any vital sign and/or heart rhythm information that could potentially be obtained via home monitoring, at the time of his visit. I reminded [name] to expect a phone call prior to his visit.  [name], CMA 03/21/2019 3:31 PM   INSTRUCTIONS FOR DOWNLOADING THE MYCHART APP TO SMARTPHONE  - The patient must first make sure to have activated MyChart and know their login information - If Apple, go to [employer] Store and type in MyChart in the search bar and download the app. If Android, ask patient to go to [employer] Play Store and type in [place] in the search bar and download the app. The app is free but as with any other app downloads, their phone may require them to verify saved payment information or Apple/Android  password.  - The patient will need to then log into the app with their MyChart username and password, and select [place] as their healthcare provider to link the account. When it is time for your visit, go to the MyChart app, find appointments, and click Begin Video Visit. Be sure to Select Allow for your device to access the Microphone and Camera for your visit. You will then be connected, and your provider will be with you shortly.  **If they have any issues connecting, or need assistance please contact MyChart service desk [phone])**  **If using a computer, in order to ensure the best quality for their visit they will need to use either of the following Internet Browsers: [employer] Edge, or Google Chrome**  IF USING DOXIMITY or DOXY.ME - The patient will receive a link just prior to their visit by text.     FULL LENGTH CONSENT FOR TELE-HEALTH VISIT   I hereby voluntarily request, consent and authorize [facility] and its employed or contracted physicians, physician assistants, nurse practitioners or other licensed health care professionals (the Practitioner), to provide me with telemedicine health care services (the "Services") as deemed necessary by the treating Practitioner. I acknowledge and consent to receive the Services by the Practitioner via telemedicine. I understand that the telemedicine visit will involve communicating with the Practitioner through live audiovisual communication technology and the disclosure of certain medical information by electronic transmission. I acknowledge that I have been given the opportunity to request an in-person assessment or other available alternative prior to the telemedicine visit and am voluntarily participating in the telemedicine visit.  I understand that I have the right to withhold or withdraw my consent to the use of telemedicine in the course of my care at any time, without affecting my right to future care or treatment,  and that the Practitioner or I may terminate the telemedicine visit at any time. I understand that I have the right to inspect all information obtained and/or recorded in the course of the telemedicine visit and may receive copies of available information for a reasonable fee.  I understand that some of the potential risks of receiving the Services via telemedicine include:  Marland Kitchen. Delay or interruption in medical evaluation due to technological equipment failure or disruption; . Information transmitted may not be sufficient (e.g. poor resolution of images) to allow for appropriate medical decision making by the Practitioner; and/or  . In rare instances, security protocols could fail, causing a breach of personal health information.  Furthermore, I acknowledge that it is my responsibility to provide information about my medical history, conditions and care that is complete and accurate to the best of my ability. I acknowledge that Practitioner's advice, recommendations, and/or decision may be based on factors not within their control, such as incomplete or inaccurate data provided by me or distortions of diagnostic images or specimens that may result from electronic transmissions. I understand that the  practice of medicine is not an Chief Strategy Officer and that Practitioner makes no warranties or guarantees regarding treatment outcomes. I acknowledge that I will receive a copy of this consent concurrently upon execution via email to the email address I last provided but may also request a printed copy by calling the office of [facility].    I understand that my insurance will be billed for this visit.   I have read or had this consent read to me. . I understand the contents of this consent, which adequately explains the benefits and risks of the Services being provided via telemedicine.  . I have been provided ample opportunity to ask questions regarding this consent and the Services and have had my questions  answered to my satisfaction. . I give my informed consent for the services to be provided through the use of telemedicine in my medical care  By participating in this telemedicine visit I agree to the above.

## 2019-03-22 ENCOUNTER — Telehealth: Payer: Self-pay | Admitting: Cardiology

## 2019-03-22 ENCOUNTER — Ambulatory Visit: Payer: BLUE CROSS/BLUE SHIELD | Admitting: Podiatry

## 2019-03-22 NOTE — Telephone Encounter (Signed)
Home phone/ consent/ my chart/ pre reg completed °

## 2019-03-25 ENCOUNTER — Telehealth (INDEPENDENT_AMBULATORY_CARE_PROVIDER_SITE_OTHER): Payer: PRIVATE HEALTH INSURANCE | Admitting: Cardiology

## 2019-03-25 VITALS — BP 123/63 | HR 58 | Ht 71.0 in | Wt 210.0 lb

## 2019-03-25 DIAGNOSIS — I10 Essential (primary) hypertension: Secondary | ICD-10-CM

## 2019-03-25 DIAGNOSIS — R072 Precordial pain: Secondary | ICD-10-CM

## 2019-03-25 DIAGNOSIS — E7800 Pure hypercholesterolemia, unspecified: Secondary | ICD-10-CM | POA: Diagnosis not present

## 2019-03-25 DIAGNOSIS — I2510 Atherosclerotic heart disease of native coronary artery without angina pectoris: Secondary | ICD-10-CM

## 2019-03-25 MED ORDER — ASPIRIN EC 81 MG PO TBEC: 81.0000 mg | DELAYED_RELEASE_TABLET | Freq: Every day | ORAL | 3 refills | Status: AC

## 2019-03-25 MED ORDER — ROSUVASTATIN CALCIUM 20 MG PO TABS: 20.0000 mg | ORAL_TABLET | Freq: Every day | ORAL | 3 refills | Status: DC

## 2019-03-25 NOTE — Patient Instructions (Signed)
Medication Instructions:  STOP PLAVIX  DECREASE ASPIRIN TO 81 MG ONCE DAILY  STOP LOVASTATIN   START ROSUVASTATIN 20 MG ONCE DAILY If you need a refill on your cardiac medications before your next appointment, please call your pharmacy.   Lab work: Your physician recommends that you return for lab work in: 6 WEEKS PRIOR TO EATING If you have labs (blood work) drawn today and your tests are completely normal, you will receive your results only by: Marland Kitchen MyChart Message (if you have MyChart) OR . A paper copy in the mail If you have any lab test that is abnormal or we need to change your treatment, we will call you to review the results.  Follow-Up: At [facility], you and your health needs are our priority.  As part of our continuing mission to provide you with exceptional heart care, we have created designated Provider Care Teams.  These Care Teams include your primary Cardiologist (physician) and Advanced Practice Providers (APPs -  Physician Assistants and Nurse Practitioners) who all work together to provide you with the care you need, when you need it. You will need a follow up appointment in 6 months.  Please call our office 2 months in advance to schedule this appointment.  You may see Olga Millers MD or one of the following Advanced Practice Providers on your designated Care Team:   Corine Shelter, PA-C Judy Pimple, [place] . Marjie Skiff, PA-C

## 2019-03-27 ENCOUNTER — Ambulatory Visit: Payer: PRIVATE HEALTH INSURANCE | Admitting: Podiatry

## 2019-03-29 ENCOUNTER — Ambulatory Visit: Payer: PRIVATE HEALTH INSURANCE | Admitting: Cardiology

## 2019-04-09 ENCOUNTER — Telehealth: Payer: Self-pay | Admitting: Family Medicine

## 2019-04-09 NOTE — Telephone Encounter (Signed)
Patient has cyst on back for about a month now. Yellow pus, red, swollen, warm to touch  Patient asking if heeds to make an appt or can we do virtual visit?  Please advise and contact patient.  Thank you Annabelle Harman

## 2019-04-10 ENCOUNTER — Ambulatory Visit (INDEPENDENT_AMBULATORY_CARE_PROVIDER_SITE_OTHER): Payer: PRIVATE HEALTH INSURANCE | Admitting: Family Medicine

## 2019-04-10 ENCOUNTER — Other Ambulatory Visit: Payer: Self-pay

## 2019-04-10 ENCOUNTER — Encounter: Payer: Self-pay | Admitting: Family Medicine

## 2019-04-10 VITALS — BP 121/66 | HR 64 | Temp 98.2°F | Resp 17 | Ht 71.5 in | Wt 213.4 lb

## 2019-04-10 DIAGNOSIS — H5789 Other specified disorders of eye and adnexa: Secondary | ICD-10-CM

## 2019-04-10 DIAGNOSIS — L0291 Cutaneous abscess, unspecified: Secondary | ICD-10-CM | POA: Diagnosis not present

## 2019-04-10 MED ORDER — MUPIROCIN 2 % EX OINT: 1.0000 "application " | TOPICAL_OINTMENT | Freq: Two times a day (BID) | CUTANEOUS | 0 refills | Status: DC

## 2019-04-10 MED ORDER — DOXYCYCLINE HYCLATE 100 MG PO TABS: 100.0000 mg | ORAL_TABLET | Freq: Two times a day (BID) | ORAL | 0 refills | Status: DC

## 2019-04-10 NOTE — Telephone Encounter (Signed)
Pt was called 04/09/2019 at 3:50pm. Pt does not have access to smart phone or computer and will need to come to the office. Pt could not give me a time to come in due to transportation. Pt was called at 8am this morning and put on the schedule for today. Pt agreed to plan

## 2019-04-10 NOTE — Patient Instructions (Signed)
Start the doxycyline pill every 12 hours for 10 days.  Cleanse the area with warm soapy water twice daily- then apply ointment and dressing.   I will see if a plastic surgeon can take off the cyst under your eye. We will let you know.    If still red or swollen after antibiotics or not healed fully- then we will need to see you.     Skin Abscess  A skin abscess is an infected area on or under your skin that contains a collection of pus and other material. An abscess may also be called a furuncle, carbuncle, or boil. An abscess can occur in or on almost any part of your body. Some abscesses break open (rupture) on their own. Most continue to get worse unless they are treated. The infection can spread deeper into the body and eventually into your blood, which can make you feel ill. Treatment usually involves draining the abscess. What are the causes? An abscess occurs when germs, like bacteria, pass through your skin and cause an infection. This may be caused by:  A scrape or cut on your skin.  A puncture wound through your skin, including a needle injection or insect bite.  Blocked oil or sweat glands.  Blocked and infected hair follicles.  A cyst that forms beneath your skin (sebaceous cyst) and becomes infected. What increases the risk? This condition is more likely to develop in people who:  Have a weak body defense system (immune system).  Have diabetes.  Have dry and irritated skin.  Get frequent injections or use illegal IV drugs.  Have a foreign body in a wound, such as a splinter.  Have problems with their lymph system or veins. What are the signs or symptoms? Symptoms of this condition include:  A painful, firm bump under the skin.  A bump with pus at the top. This may break through the skin and drain. Other symptoms include:  Redness surrounding the abscess site.  Warmth.  Swelling of the lymph nodes (glands) near the abscess.  Tenderness.  A sore on  the skin. How is this diagnosed? This condition may be diagnosed based on:  A physical exam.  Your medical history.  A sample of pus. This may be used to find out what is causing the infection.  Blood tests.  Imaging tests, such as an ultrasound, CT scan, or MRI. How is this treated? A small abscess that drains on its own may not need treatment. Treatment for larger abscesses may include:  Moist heat or heat pack applied to the area several times a day.  A procedure to drain the abscess (incision and drainage).  Antibiotic medicines. For a severe abscess, you may first get antibiotics through an IV and then change to antibiotics by mouth. Follow these instructions at home: Medicines   Take over-the-counter and prescription medicines only as told by your health care provider.  If you were prescribed an antibiotic medicine, take it as told by your health care provider. Do not stop taking the antibiotic even if you start to feel better. Abscess care   If you have an abscess that has not drained, apply heat to the affected area. Use the heat source that your health care provider recommends, such as a moist heat pack or a heating pad. ? Place a towel between your skin and the heat source. ? Leave the heat on for 20-30 minutes. ? Remove the heat if your skin turns bright red. This is especially  important if you are unable to feel pain, heat, or cold. You may have a greater risk of getting burned.  Follow instructions from your health care provider about how to take care of your abscess. Make sure you: ? Cover the abscess with a bandage (dressing). ? Change your dressing or gauze as told by your health care provider. ? Wash your hands with soap and water before you change the dressing or gauze. If soap and water are not available, use hand sanitizer.  Check your abscess every day for signs of a worsening infection. Check for: ? More redness, swelling, or pain. ? More fluid or  blood. ? Warmth. ? More pus or a bad smell. General instructions  To avoid spreading the infection: ? Do not share personal care items, towels, or hot tubs with others. ? Avoid making skin contact with other people.  Keep all follow-up visits as told by your health care provider. This is important. Contact a health care provider if you have:  More redness, swelling, or pain around your abscess.  More fluid or blood coming from your abscess.  Warm skin around your abscess.  More pus or a bad smell coming from your abscess.  A fever.  Muscle aches.  Chills or a general ill feeling. Get help right away if you:  Have severe pain.  See red streaks on your skin spreading away from the abscess. Summary  A skin abscess is an infected area on or under your skin that contains a collection of pus and other material.  A small abscess that drains on its own may not need treatment.  Treatment for larger abscesses may include having a procedure to drain the abscess and taking an antibiotic. This information is not intended to replace advice given to you by your health care provider. Make sure you discuss any questions you have with your health care provider. Document Released: 08/24/2005 Document Revised: 12/28/2017 Document Reviewed: 12/28/2017 Elsevier Interactive Patient Education  2019 [employer].

## 2019-04-10 NOTE — Progress Notes (Signed)
[name] , 1956/08/01, 62 y.o., male MRN: [id number] Patient Care Team    Relationship Specialty Notifications Start End  Natalia Leatherwood, DO PCP - General Family Medicine  11/29/18   Rennis Chris, MD Consulting Physician Ophthalmology  01/09/19     Chief Complaint  Patient presents with  . Recurrent Skin Infections    Pt has had boil on skin for over one month now. Drains blood, red, swollen, and red      Subjective: Pt presents for an OV with complaints of skin infection of rhythm 4 weeks  duration.  Associated symptoms include numbness, swelling, drainage.  He reports it was draining puslike material, and then started to heal.  However it then became reinfected, swollen, red and now is draining pus and blood.  He states the abscess did bust yesterday.  Denies fever, chills, nausea or vomit.  He denies having any other skin infections currently.  He denies known MRSA infections.  He states he did have an abscess once on his back that was lanced many years ago.  Cyst of face: Patient has an 8 mm cyst just below his right lateral canthus.  It has been there for many years and he feels it is still getting a little bit larger.  He was referred to dermatology and ophthalmology neither of which performed procedure.  He would like to have it removed.  It is now becoming a nuisance and in his field of vision.  Depression screen [facility] 2/9 02/12/2019 11/29/2018  Decreased Interest 0 3  Down, Depressed, Hopeless 3 3  PHQ - 2 Score 3 6  Altered sleeping 0 3  Tired, decreased energy 0 3  Change in appetite 0 3  Feeling bad or failure about yourself  1 3  Trouble concentrating 0 3  Moving slowly or fidgety/restless 0 2  Suicidal thoughts 0 0  PHQ-9 Score 4 23  Difficult doing work/chores Not difficult at all -    Allergies  Allergen Reactions  . Hydrocodone Other (See Comments)    PT reports  Confusion when tacking Hydrocodone  . Nsaids     Aspirin/Plavix therapy  . 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 Alcohol addiction (HCC)   . Anxiety   . Arthritis   . CAD (coronary artery disease)   . Coronary artery disease with hx of myocardial infarct w/o hx of CABG 2004  . Depression   . Dextrocardia   . Diabetes (HCC)   . Heart attack [facility]) 2004   Stent placement 2008  . Hyperlipidemia   . Hypertension   . Insomnia    Past Surgical History:  Procedure Laterality Date  . CORONARY ANGIOPLASTY WITH STENT PLACEMENT  2008   Family History  Problem Relation Age of Onset  . Heart attack Mother   . Heart disease Mother   . Diabetes Mother   . Arthritis Mother   . Hyperlipidemia Mother   . Hypertension Mother   . Breast cancer Mother   . Heart attack Father   . Heart disease Father   . Alcohol abuse Father   . Early death Father   .  Arthritis Sister   . Heart disease Sister   . Hyperlipidemia Sister   . Asthma Sister   . COPD Sister   . Diabetes Sister   . Alcohol abuse Sister    Allergies as of 04/10/2019      Reactions   Hydrocodone Other (See Comments)   PT reports  Confusion when tacking Hydrocodone   Nsaids    Aspirin/Plavix therapy   Penicillins Other (See Comments)   Childhood unsure if still allgeric      Medication List       Accurate as of Apr 10, 2019  3:33 PM. If you have any questions, ask your nurse or doctor.        aspirin EC 81 MG tablet Take 1 tablet (81 mg total) by mouth daily.   clopidogrel 75 MG tablet Commonly known as:  PLAVIX   diazepam 5 MG tablet Commonly known as:  VALIUM Take 1 tablet (5 mg total) by mouth every 8 (eight) hours as needed for anxiety.   glipiZIDE 10 MG tablet Commonly known as:  GLUCOTROL Take 2  tablets (20 mg total) by mouth daily.   lisinopril-hydrochlorothiazide 20-25 MG tablet Commonly known as:  ZESTORETIC Take 2 tablets by mouth daily.   metoprolol succinate 25 MG 24 hr tablet Commonly known as:  TOPROL-XL Take 1 tablet (25 mg total) by mouth daily.   Omega 3 1000 MG Caps Take 1 capsule (1,000 mg total) by mouth daily.   omega-3 acid ethyl esters 1 g capsule Commonly known as:  LOVAZA   PARoxetine 20 MG tablet Commonly known as:  Paxil Take 1 tablet (20 mg total) by mouth daily.   rosuvastatin 20 MG tablet Commonly known as:  CRESTOR Take 1 tablet (20 mg total) by mouth daily.   SitaGLIPtin-MetFORMIN HCl [phone] MG Tb24 Take 1 tablet by mouth daily.   traZODone 50 MG tablet Commonly known as:  DESYREL Take 1-2 tablets (50-100 mg total) by mouth at bedtime as needed for sleep.       All past medical history, surgical history, allergies, family history, immunizations andmedications were updated in the EMR today and reviewed under the history and medication portions of their EMR.     ROS: Negative, with the exception of above mentioned in HPI   Objective:  BP 121/66 (BP Location: Left Arm, Patient Position: Sitting, Cuff Size: Normal)   Pulse 64   Temp 98.2 F (36.8 C) (Oral)   Resp 17   Ht 5' 11.5" (1.816 m)   Wt 213 lb 6 oz (96.8 kg)   SpO2 98%   BMI 29.35 kg/m  Body mass index is 29.35 kg/m. Gen: Afebrile. No acute distress. Nontoxic in appearance, well developed, well nourished.  HENT: AT. [place].  MMM Eyes:Pupils Equal Round Reactive to light, Extraocular movements intact,  Conjunctiva without redness, discharge or icterus.  Millimeter fluid-filled cyst just inferior to right lateral canthus. Skin: Proximately 3 inch diameter of erythema with central abscess formation of right thoracic area.  Draining pus-like fluid. Neuro: Normal gait. PERLA. EOMi. Alert. Oriented x3 No exam data present No results found. No results found for this or any  previous visit (from the past 24 hour(s)).  Assessment/Plan: [name] is a 63 y.o. male present for OV for  Abscess -Wound was actively draining.  Cleansed area well with Betadine swabs.  Expelled remainder of pus-like drainage collected wound culture today.  Light antibiotic ointment and pressure dressing. - Wound culture sent -Was instructed on wound  care.  Cleanse daily with warm soapy water.  Apply Bactroban ointment after cleansing.  Keep covered with gauze dressing. - doxycycline (VIBRA-TABS) 100 MG tablet; Take 1 tablet (100 mg total) by mouth 2 (two) times daily.  Dispense: 20 tablet; Refill: 0 - mupirocin ointment (BACTROBAN) 2 %; Place 1 application into the nose 2 (two) times daily.  Dispense: 22 g; Refill: 0 -Follow-up in 1 week if wound not improving.  Cyst of eye - Ambulatory referral to Plastic Surgery   Reviewed expectations re: course of current medical issues.  Discussed self-management of symptoms.  Outlined signs and symptoms indicating need for more acute intervention.  Patient verbalized understanding and all questions were answered.  Patient received an After-Visit Summary.    No orders of the defined types were placed in this encounter.  > 25 minutes spent with patient, >50% of time spent face to face counseling and  coordinating care.     Note is dictated utilizing voice recognition software. Although note has been proof read prior to signing, occasional typographical errors still can be missed. If any questions arise, please do not hesitate to call for verification.   electronically signed by:  Felix Pacinienee Kuneff, DO   Primary Care - [place]

## 2019-04-11 DIAGNOSIS — L0291 Cutaneous abscess, unspecified: Secondary | ICD-10-CM | POA: Insufficient documentation

## 2019-04-11 DIAGNOSIS — H5789 Other specified disorders of eye and adnexa: Secondary | ICD-10-CM | POA: Insufficient documentation

## 2019-04-13 LAB — WOUND CULTURE
MICRO NUMBER:: 470880
SPECIMEN QUALITY:: ADEQUATE

## 2019-04-15 ENCOUNTER — Ambulatory Visit: Payer: PRIVATE HEALTH INSURANCE | Admitting: Cardiology

## 2019-04-15 ENCOUNTER — Telehealth: Payer: Self-pay | Admitting: Family Medicine

## 2019-04-15 MED ORDER — SULFAMETHOXAZOLE-TRIMETHOPRIM 800-160 MG PO TABS: 1.0000 | ORAL_TABLET | Freq: Two times a day (BID) | ORAL | 0 refills | Status: DC

## 2019-04-15 NOTE — Telephone Encounter (Signed)
Called and spoke with patient, information given and pt verbalized understanding

## 2019-04-15 NOTE — Telephone Encounter (Signed)
Please inform patient the following information: His wound culture returned and we need to change his abx. Please have him discontinue the doxy and start the bactrim. Continue the ointment. F/u in 2 weeks if not resolved.

## 2019-04-23 ENCOUNTER — Other Ambulatory Visit: Payer: Self-pay

## 2019-04-23 ENCOUNTER — Ambulatory Visit (INDEPENDENT_AMBULATORY_CARE_PROVIDER_SITE_OTHER): Payer: PRIVATE HEALTH INSURANCE | Admitting: Plastic Surgery

## 2019-04-23 ENCOUNTER — Encounter: Payer: Self-pay | Admitting: Plastic Surgery

## 2019-04-23 VITALS — BP 131/79 | HR 86 | Temp 98.5°F | Ht 71.0 in | Wt 212.0 lb

## 2019-04-23 DIAGNOSIS — H5789 Other specified disorders of eye and adnexa: Secondary | ICD-10-CM | POA: Diagnosis not present

## 2019-04-23 MED ORDER — ERYTHROMYCIN 5 MG/GM OP OINT: 1.0000 "application " | TOPICAL_OINTMENT | Freq: Every day | OPHTHALMIC | 0 refills | Status: DC

## 2019-04-23 MED ORDER — DOXYCYCLINE HYCLATE 100 MG PO TABS: 100.0000 mg | ORAL_TABLET | Freq: Two times a day (BID) | ORAL | 0 refills | Status: DC

## 2019-04-23 NOTE — Progress Notes (Signed)
Patient ID: [name], male    DOB: 09/08/1956, 63 y.o.   MRN: [id number]   Chief Complaint  Patient presents with  . Skin Problem    The patient is a 63 year old male here for evaluation of his right peri-orbital area.  The patient states that he has had a lesion on the lower lid for months.  It is getting larger.  He notices that his eye is itchy and tender in the morning.  He is concerned that it is going to get larger and obstruct his vision.  It is cystic in form at the lateral border of the lower right eyelid.  He is otherwise in stable health.  He has a history of myocardial infarction and he has diabetes.  Nothing seems to make the area better and he has not had any surgical intervention on it today   Review of Systems  Constitutional: Negative.  Negative for activity change and appetite change.  HENT: Negative.  Negative for facial swelling.   Eyes: Positive for itching. Negative for visual disturbance.  Respiratory: Negative.  Negative for chest tightness and shortness of breath.   Cardiovascular: Negative.   Gastrointestinal: Negative.  Negative for abdominal pain.  Genitourinary: Negative.   Musculoskeletal: Negative.  Negative for back pain.  Skin: Negative.  Negative for color change.  Hematological: Negative.   Psychiatric/Behavioral: Negative.     Past Medical History:  Diagnosis Date  . Alcohol addiction (HCC)   . Anxiety   . Arthritis   . CAD (coronary artery disease)   . Coronary artery disease with hx of myocardial infarct w/o hx of CABG 2004  . Depression   . Dextrocardia   . Diabetes (HCC)   . Heart attack [facility](HCC) 2004   Stent placement 2008  . Hyperlipidemia   . Hypertension   . Insomnia     Past Surgical History:  Procedure Laterality Date  . CORONARY ANGIOPLASTY WITH STENT PLACEMENT  2008      Current Outpatient Medications:  .  aspirin EC 81 MG tablet, Take 1 tablet (81 mg total) by mouth daily., Disp: 90 tablet, Rfl: 3 .  clopidogrel  (PLAVIX) 75 MG tablet, , Disp: , Rfl:  .  diazepam (VALIUM) 5 MG tablet, Take 1 tablet (5 mg total) by mouth every 8 (eight) hours as needed for anxiety., Disp: 90 tablet, Rfl: 5 .  glipiZIDE (GLUCOTROL) 10 MG tablet, Take 2 tablets (20 mg total) by mouth daily., Disp: 60 tablet, Rfl: 2 .  lisinopril-hydrochlorothiazide (PRINZIDE,ZESTORETIC) 20-25 MG tablet, Take 2 tablets by mouth daily., Disp: 60 tablet, Rfl: 5 .  metoprolol succinate (TOPROL-XL) 25 MG 24 hr tablet, Take 1 tablet (25 mg total) by mouth daily., Disp: 30 tablet, Rfl: 5 .  mupirocin ointment (BACTROBAN) 2 %, Place 1 application into the nose 2 (two) times daily., Disp: 22 g, Rfl: 0 .  Omega 3 1000 MG CAPS, Take 1 capsule (1,000 mg total) by mouth daily., Disp: 30 each, Rfl: 11 .  omega-3 acid ethyl esters (LOVAZA) 1 g capsule, , Disp: , Rfl:  .  PARoxetine (PAXIL) 20 MG tablet, Take 1 tablet (20 mg total) by mouth daily., Disp: 30 tablet, Rfl: 5 .  rosuvastatin (CRESTOR) 20 MG tablet, Take 1 tablet (20 mg total) by mouth daily., Disp: 90 tablet, Rfl: 3 .  SitaGLIPtin-MetFORMIN HCl [phone] MG TB24, Take 1 tablet by mouth daily., Disp: 30 tablet, Rfl: 2 .  traZODone (DESYREL) 50 MG tablet, Take 1-2 tablets (  50-100 mg total) by mouth at bedtime as needed for sleep., Disp: 60 tablet, Rfl: 5   Objective:   Vitals:   04/23/19 0901  BP: 131/79  Pulse: 86  Temp: 98.5 F (36.9 C)  SpO2: 96%    Physical Exam Vitals signs and nursing note reviewed.  Constitutional:      Appearance: Normal appearance.  HENT:     Head: Normocephalic.     Nose: Nose normal.     Mouth/Throat:     Mouth: Mucous membranes are moist.  Eyes:     Extraocular Movements: Extraocular movements intact.     Pupils: Pupils are equal, round, and reactive to light.   Cardiovascular:     Rate and Rhythm: Normal rate.  Pulmonary:     Effort: Pulmonary effort is normal. No respiratory distress.  Abdominal:     General: Abdomen is flat. There is no  distension.  Skin:    General: Skin is warm.  Neurological:     General: No focal deficit present.     Mental Status: He is alert and oriented to person, place, and time.  Psychiatric:        Mood and Affect: Mood normal.        Behavior: Behavior normal.     Assessment & Plan:  Cyst of eye   Recommend excision of right lower eyelid cyst in the office.  I would like him to start Johnson's baby shampoo cleanings daily.  I also sent in erythromycin ointment and doxycycline for him to pick up at the pharmacy.  I would like him to start the doxycycline the day before his procedure. Pictures were taken and placed in the chart with the patient permission.   Alena Bills Johanne Mcglade, DO

## 2019-04-24 ENCOUNTER — Institutional Professional Consult (permissible substitution): Payer: PRIVATE HEALTH INSURANCE | Admitting: Plastic Surgery

## 2019-04-24 ENCOUNTER — Ambulatory Visit (INDEPENDENT_AMBULATORY_CARE_PROVIDER_SITE_OTHER): Payer: PRIVATE HEALTH INSURANCE | Admitting: Family Medicine

## 2019-04-24 ENCOUNTER — Encounter: Payer: Self-pay | Admitting: Family Medicine

## 2019-04-24 DIAGNOSIS — E663 Overweight: Secondary | ICD-10-CM

## 2019-04-24 DIAGNOSIS — F411 Generalized anxiety disorder: Secondary | ICD-10-CM

## 2019-04-24 DIAGNOSIS — IMO0001 Reserved for inherently not codable concepts without codable children: Secondary | ICD-10-CM

## 2019-04-24 DIAGNOSIS — I252 Old myocardial infarction: Secondary | ICD-10-CM

## 2019-04-24 DIAGNOSIS — E1142 Type 2 diabetes mellitus with diabetic polyneuropathy: Secondary | ICD-10-CM

## 2019-04-24 DIAGNOSIS — E1165 Type 2 diabetes mellitus with hyperglycemia: Secondary | ICD-10-CM

## 2019-04-24 DIAGNOSIS — Z955 Presence of coronary angioplasty implant and graft: Secondary | ICD-10-CM

## 2019-04-24 DIAGNOSIS — I2510 Atherosclerotic heart disease of native coronary artery without angina pectoris: Secondary | ICD-10-CM

## 2019-04-24 DIAGNOSIS — G4700 Insomnia, unspecified: Secondary | ICD-10-CM

## 2019-04-24 DIAGNOSIS — E7800 Pure hypercholesterolemia, unspecified: Secondary | ICD-10-CM

## 2019-04-24 DIAGNOSIS — I10 Essential (primary) hypertension: Secondary | ICD-10-CM

## 2019-04-24 DIAGNOSIS — E113293 Type 2 diabetes mellitus with mild nonproliferative diabetic retinopathy without macular edema, bilateral: Secondary | ICD-10-CM

## 2019-04-24 MED ORDER — TRAZODONE HCL 50 MG PO TABS: 50.0000 mg | ORAL_TABLET | Freq: Every evening | ORAL | 5 refills | Status: DC | PRN

## 2019-04-24 MED ORDER — PAROXETINE HCL 20 MG PO TABS: 20.0000 mg | ORAL_TABLET | Freq: Every day | ORAL | 5 refills | Status: DC

## 2019-04-24 MED ORDER — GLIPIZIDE 10 MG PO TABS: 20.0000 mg | ORAL_TABLET | Freq: Every day | ORAL | 2 refills | Status: DC

## 2019-04-24 MED ORDER — LISINOPRIL-HYDROCHLOROTHIAZIDE 20-25 MG PO TABS: 1.5000 | ORAL_TABLET | Freq: Every day | ORAL | 5 refills | Status: DC

## 2019-04-24 MED ORDER — SITAGLIP PHOS-METFORMIN HCL ER 100-1000 MG PO TB24: 1.0000 | ORAL_TABLET | Freq: Every day | ORAL | 2 refills | Status: DC

## 2019-04-24 MED ORDER — DIAZEPAM 5 MG PO TABS: 5.0000 mg | ORAL_TABLET | Freq: Three times a day (TID) | ORAL | 5 refills | Status: DC | PRN

## 2019-04-24 MED ORDER — METOPROLOL SUCCINATE ER 25 MG PO TB24: 25.0000 mg | ORAL_TABLET | Freq: Every day | ORAL | 5 refills | Status: DC

## 2019-04-24 NOTE — Progress Notes (Signed)
VIRTUAL VISIT VIA -telephone  I connected with [name] on 04/26/19 at  8:40 AM EDT by telemedicine application and verified that I am speaking with the correct person using two identifiers. Location patient: Home Location provider: [facility], Office Persons participating in the virtual visit: Patient, Dr. Claiborne Billings and R.Baker, LPN  I discussed the limitations of evaluation and management by telemedicine and the availability of in person appointments. The patient expressed understanding and agreed to proceed.  Interactive audio and video telecommunications were attempted between this provider and patient, however failed, due to patient having technical difficulties OR patient did not have access to video capability. We continued and completed visit with audio only.   Patient Care Team    Relationship Specialty Notifications Start End  Natalia Leatherwood, DO PCP - General Family Medicine  11/29/18   Rennis Chris, MD Consulting Physician Ophthalmology  01/09/19      SUBJECTIVE Chief Complaint  Patient presents with   Diabetes    Pt is doing well with no complaints. Pt is concerned with A1C results and last visit being greater than 9.0. Pt went to plastic surgeon appt yesterday. They are scheduling surgery after calling insurance company.    Hyperlipidemia   Anxiety    Pt states he went to pharmacy and they told him Paxil RX was changed to BID, pt did not want to take BID and stopped taking medication. Pts valium will run out before RX  refill date due to taking 4x daily some days. Stopped taking Paxil Thursday.     HPI:  Depression/anxiety/insomnia: Patient reports when he was taking the Paxil 20 mg daily (he was breaking pill up into thirds and taking throughout the day) his anxiety was well controlled.  He is taking the Valium 3 times daily.  He is using trazodone 50 mg daily at bedtime.  He did stop using the Paxil because he thought the pharmacy told him it was twice  daily, and he was concerned that the elevated dose would cause him more side effects.  When questioning him today as to why he just did not stay on the pill as he was since the dose per pill was unchanged he is uncertain.  Pill bottle reviewed with him today in the instructions on the pill bottle are Paxil 20 mg p.o. daily.  Overall when taking his medication as directed he feels his anxiety is the best it has been in years. Prior note: Patient reports he has been treated for his depression, anxiety and insomnia with Valium 5 mg many years.  He states he used to be treated with Xanax and that medication caused him to have difficulties with memory.  He states he takes his Valium about 3 times a day.  He used to only need the Valium twice a day.  Also has been prescribed Ambien, which she states does not work at all.  He reports having depression and anxiety greater than 15 to 20 years.  Feels his anxiety became much more intense after he found his girlfriend greater than 20 years dead in their bed from COPD complications.  He admits he had problems before she passed away, however she had always been able to calm him down. He states he has a rather routine day.  He does not have much social contact in a day.  He lives by himself.  He reports that he always feels like he "stays on edge.  "He reports when he wakes up he  is already feeling on edge and he will take a Valium at that time.  He then has breakfast and goes on a 4 mile walk daily.  For lunch he starts to feel his anxiety sudden and he becomes panicked.  He states when he feels this way he will break out in a sweat, become very fearful and scared.  He states he commonly fears there is someone in the house with a gun.  He goes to his bedroom gets in his bed and because his sister.  He states the fear is so intense and he just cannot control it.  He denies any known trauma or event experienced in his life that would create such a fear.  He states he did go see  a counselor/psychiatrist at [facility] in the past 6 months, and he did not feel that was helpful for him.  Denies any SI or HI.  Diabetes, uncontrolled/overweight:  Patient reports he has been a diabetic since 1998.  Patient denies dizziness, hyperglycemic or hypoglycemic events.  He reports compliance with Janumet [phone] and glipizide 10 mg daily.  He reported having elevation in fasting glucoses after start of Janumet, and glipizide 10 mg daily was added back a few weeks ago.  He had been on glipizide 10 mg twice daily Metformin prior to changes.. Patient denies numbness, tingling in the extremities or nonhealing wounds of feet.  PNA series: Pneumovax 23 03/28/2014--> repeat after August 2020 when he will be 63 Flu shot:  Up to date 11/29/2018 (recommneded yearly) BMP: 01/30/2018 elevated LFTs;  very mild-which appeared new Foot exam: Completed 11/29/2018-referral to podiatry Eye exam:  Completed 01/08/2019-[facility] A1c:10.4>>>9.4>>  9.7  Hypertension/CAD/S/p Stent/prior MI:  Pt has a significant cardiac history of "anterior MI in 2004 with stents was LAD and then perhaps stents to branch vessel 2008 (by DR. Renaldo- cardio note 2015)" Pt reports compliance with aspirin 81 mg, Plavix 75 mg (continued despite cardiology stating he did not need to), lisinopril-HCTZ 40- 50,  Crestor 20 mg mg daily, metoprolol 25 mg daily. blood pressures ranges at home not routinely checked.Patient denies chest pain, shortness of breath, dizziness or lower extremity edema.  BMP:  01/2019 mild elevation in AST and glucose over 200 CBC:  11/2018 within normal limits Lipids: 02/12/2019 total cholesterol 149, HDL 39, LDL 87, triglycerides 216. TSH: 11/2018 within normal limits Diet: Low-sodium Exercise: Routine exercise daily RF: Hypertension, hyperlipidemia, diabetes, overweight, family history, CAD, MI  NM Heartspect Ph Stress- [facility]-07/10/2014   Other Result Information  Acute Interface, Incoming Rad Results - 07/10/2014  12:16 PM EDT TECHNIQUE: Nuclear pharmacological cardiac perfusion exam: INDICATION: Previous MI and coronary artery disease and dextrocardia TECHNIQUE: The patient was initially injected with 12.9 millicuries of technetium Cardiolite. SPECT imaging of the left ventricle was performed at rest. The patient received an additional 38 millicuries of technetium Cardiolite injected intravenously.  SPECT imaging of the left ventricle was then repeated. Resting ECG revealed normal sinus rhythm and a normal tracing. There were no ischemic ECG changes with Lexi scan infusion. FINDINGS: There is moderate size  fixed defect in the anterior apical region consistent with previous infarct.. No reversible perfusion defects are identified. The patient has dextrocardia so the images are reversed. IMPRESSION: Abnormal cardiac perfusion exam for previous anterior apical infarct with no evidence of reversible ischemia. Prognostically this is an abnormal, but low risk scan IMPRESSION: Left ventricular ejection fraction of 49%. FINDINGS: Dynamic imaging of the left ventricle demonstrates apical hypokinesis. IMPRESSION: Regional wall motion abnormality  with mildly depressed left ventricular systolic function.    ROS: See pertinent positives and negatives per HPI.  Patient Active Problem List   Diagnosis Date Noted   Cyst of eye 04/11/2019   Mild nonproliferative diabetic retinopathy (HCC) 01/09/2019   Overweight (BMI 25.0-29.9) 11/29/2018   Generalized anxiety disorder 09/02/2017   Vasculogenic erectile dysfunction 09/02/2017   Murmur 12/28/2015   Insomnia 12/28/2015   Diabetic peripheral neuropathy (HCC) 12/21/2015   Uncontrolled type 2 diabetes mellitus without complication, without long-term current use of insulin (HCC) 07/07/2014   S/P coronary artery stent placement 03/31/2014   Coronary artery disease with history of myocardial infarction without history of CABG 03/31/2014   Essential  hypertension 03/28/2014   HYPERCHOLESTEROLEMIA 11/08/2010    Social History   Tobacco Use   Smoking status: Never Smoker   Smokeless tobacco: Never Used  Substance Use Topics   Alcohol use: Yes    Alcohol/week: 0.0 standard drinks    Comment: Occasional    Current Outpatient Medications:    aspirin EC 81 MG tablet, Take 1 tablet (81 mg total) by mouth daily., Disp: 90 tablet, Rfl: 3   clopidogrel (PLAVIX) 75 MG tablet, , Disp: , Rfl:    diazepam (VALIUM) 5 MG tablet, Take 1 tablet (5 mg total) by mouth every 8 (eight) hours as needed for anxiety., Disp: 90 tablet, Rfl: 5   glipiZIDE (GLUCOTROL) 10 MG tablet, Take 2 tablets (20 mg total) by mouth daily., Disp: 60 tablet, Rfl: 2   lisinopril-hydrochlorothiazide (ZESTORETIC) 20-25 MG tablet, Take 2 tablets by mouth daily., Disp: 60 tablet, Rfl: 5   metoprolol succinate (TOPROL-XL) 25 MG 24 hr tablet, Take 1 tablet (25 mg total) by mouth daily., Disp: 30 tablet, Rfl: 5   Omega 3 1000 MG CAPS, Take 1 capsule (1,000 mg total) by mouth daily., Disp: 30 each, Rfl: 11   rosuvastatin (CRESTOR) 20 MG tablet, Take 1 tablet (20 mg total) by mouth daily., Disp: 90 tablet, Rfl: 3   SitaGLIPtin-MetFORMIN HCl [phone] MG TB24, Take 1 tablet by mouth daily., Disp: 30 tablet, Rfl: 2   traZODone (DESYREL) 50 MG tablet, Take 1 tablet (50 mg total) by mouth at bedtime as needed for sleep., Disp: 30 tablet, Rfl: 5   PARoxetine (PAXIL) 20 MG tablet, Take 1 tablet (20 mg total) by mouth daily., Disp: 30 tablet, Rfl: 5  Allergies  Allergen Reactions   Hydrocodone Other (See Comments)    PT reports  Confusion when tacking Hydrocodone   Nsaids     Aspirin/Plavix therapy   Penicillins Other (See Comments)    Childhood unsure if still allgeric    OBJECTIVE: There were no vitals taken for this visit. Last OV 1 week ago: 131/79 Gen: No acute distress. Nontoxic. Overweight male.  CV: No edema (per pt) Chest: Cough or shortness of breath  not present Neuro:  Alert. Oriented x3  Psych: Normal affect, dress and demeanor. Normal speech. Normal thought content and judgment.  [facility] 02/12/2019 11/29/2018  Decreased Interest 0 3  Down, Depressed, Hopeless 3 3  PHQ - 2 Score 3 6  Altered sleeping 0 3  Tired, decreased energy 0 3  Change in appetite 0 3  Feeling bad or failure about yourself  1 3  Trouble concentrating 0 3  Moving slowly or fidgety/restless 0 2  Suicidal thoughts 0 0  PHQ-9 Score 4 23  Difficult doing work/chores Not difficult at all -   GAD 7 : Generalized Anxiety Score 04/24/2019  02/12/2019 12/31/2018 11/29/2018  Nervous, Anxious, on Edge Control/stop worrying 0 0 2 3  Worry too much - different things 0 0 2 3  Trouble relaxing 2 0 2 3  Restless Easily annoyed or irritable 0 0 2 0  Afraid - awful might happen Total GAD 7 Score Anxiety Difficulty Somewhat difficult Not difficult at all - -    ASSESSMENT AND PLAN: MUKESH KORNEGAY is a 63 y.o. male present for  Depression/anxiety/insomnia: -Has a history of a rather significant depression with anxiety with paranoid features.  Improved greatly with start of paxil.  -Continue Paxil 20 mg daily (can continue to break 20 mg into TID). He is not certain why he thought the pill changed. He will restart -Continue  trazodone 50 mg nightly. -Continue Valium 5 mg TID as needed.  Agreed to continue medication for him.  [place] controlled substance database was reviewed day of appt and appropriate. -Contract will be signed next office visit for benzodiazepine use (in person). -Offered referral to counseling or psychiatry; he declined.  Diabetes, uncontrolled/diabetic peripheral neuropathy/mild nonproliferative diabetic retinopathy of both eyes/overweight:  Patient reports he has been a diabetic since 1998.   - Continue Janumet [phone] daily - Increase  Glipizide back to 10 mg BID.   PNA series: Pneumovax  23 03/28/2014--> repeat after August 2020 when he will be 63 PNA series: Pneumovax 23 03/28/2014--> repeat after August 2020 when he will be 63 Flu shot:  Up to date 11/29/2018 (recommneded yearly) BMP: 01/30/2018 elevated LFTs;  very mild-which appeared new Foot exam: Completed 11/29/2018-referral to podiatry Eye exam:  Completed 01/08/2019-[facility] A1c: 09/07/2018 A1c 10.4>>9.4>>>9.7>>>ordered future lab 2 weeks.   Hypertension/CAD/S/p Stent/prior MI/hypercholesteremia: Pt has a significant cardiac history of "anterior MI in 2004 with stents was LAD and then perhaps stents to a branch vessel 2008 (by DR. Renaldo- cardio note 2015)" -Continue aspirin 81 mg daily. Cardiology told him he no longer needed the  Plavix 75 mg daily (he is not certain he wants to stop) -Continue metoprolol 25 mg daily  - Continue Lisinopril- HCTZ  40-50 mg daily (reports he is now taking 2 pills) -Continue Crestor 20 mg daily - labs UTD Diet: Low-sodium Exercise: Routine exercise daily - now established with cardiology as well for routine follow up.   F/u 3-4 months [facility]  25 minutes spent with patient today  Felix Pacini, DO 04/26/2019

## 2019-04-26 ENCOUNTER — Encounter: Payer: Self-pay | Admitting: Family Medicine

## 2019-04-26 MED ORDER — LISINOPRIL-HYDROCHLOROTHIAZIDE 20-25 MG PO TABS: 2.0000 | ORAL_TABLET | Freq: Every day | ORAL | 5 refills | Status: DC

## 2019-04-29 ENCOUNTER — Institutional Professional Consult (permissible substitution): Payer: PRIVATE HEALTH INSURANCE | Admitting: Plastic Surgery

## 2019-05-08 ENCOUNTER — Ambulatory Visit (INDEPENDENT_AMBULATORY_CARE_PROVIDER_SITE_OTHER): Payer: PRIVATE HEALTH INSURANCE

## 2019-05-08 ENCOUNTER — Ambulatory Visit (INDEPENDENT_AMBULATORY_CARE_PROVIDER_SITE_OTHER): Payer: PRIVATE HEALTH INSURANCE | Admitting: Podiatry

## 2019-05-08 ENCOUNTER — Encounter: Payer: Self-pay | Admitting: Podiatry

## 2019-05-08 ENCOUNTER — Other Ambulatory Visit: Payer: Self-pay | Admitting: *Deleted

## 2019-05-08 ENCOUNTER — Other Ambulatory Visit: Payer: Self-pay

## 2019-05-08 VITALS — BP 139/72 | HR 63 | Temp 96.3°F

## 2019-05-08 DIAGNOSIS — M79675 Pain in left toe(s): Secondary | ICD-10-CM

## 2019-05-08 DIAGNOSIS — E119 Type 2 diabetes mellitus without complications: Secondary | ICD-10-CM

## 2019-05-08 DIAGNOSIS — E1165 Type 2 diabetes mellitus with hyperglycemia: Secondary | ICD-10-CM | POA: Diagnosis not present

## 2019-05-08 DIAGNOSIS — M79674 Pain in right toe(s): Secondary | ICD-10-CM

## 2019-05-08 DIAGNOSIS — L920 Granuloma annulare: Secondary | ICD-10-CM

## 2019-05-08 DIAGNOSIS — D689 Coagulation defect, unspecified: Secondary | ICD-10-CM | POA: Diagnosis not present

## 2019-05-08 DIAGNOSIS — I10 Essential (primary) hypertension: Secondary | ICD-10-CM

## 2019-05-08 DIAGNOSIS — Z5181 Encounter for therapeutic drug level monitoring: Secondary | ICD-10-CM

## 2019-05-08 DIAGNOSIS — E1142 Type 2 diabetes mellitus with diabetic polyneuropathy: Secondary | ICD-10-CM

## 2019-05-08 DIAGNOSIS — B351 Tinea unguium: Secondary | ICD-10-CM

## 2019-05-08 DIAGNOSIS — IMO0001 Reserved for inherently not codable concepts without codable children: Secondary | ICD-10-CM

## 2019-05-08 LAB — COMPREHENSIVE METABOLIC PANEL
ALT: 51 U/L (ref 0–53)
AST: 34 U/L (ref 0–37)
Albumin: 4.6 g/dL (ref 3.5–5.2)
Alkaline Phosphatase: 56 U/L (ref 39–117)
BUN: 15 mg/dL (ref 6–23)
CO2: 32 mEq/L (ref 19–32)
Calcium: 9.8 mg/dL (ref 8.4–10.5)
Chloride: 94 mEq/L — ABNORMAL LOW (ref 96–112)
Creatinine, Ser: 0.86 mg/dL (ref 0.40–1.50)
GFR: 89.31 mL/min (ref >60.00)
Glucose, Bld: 285 mg/dL — ABNORMAL HIGH (ref 70–99)
Potassium: 4.7 mEq/L (ref 3.5–5.1)
Sodium: 131 mEq/L — ABNORMAL LOW (ref 135–145)
Total Bilirubin: 0.3 mg/dL (ref 0.2–1.2)
Total Protein: 7.5 g/dL (ref 6.0–8.3)

## 2019-05-08 LAB — HEMOGLOBIN A1C: Hgb A1c MFr Bld: 11.8 % — ABNORMAL HIGH (ref 4.6–6.5)
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
## 2019-05-09 LAB — GLUCOSE 6 PHOSPHATE DEHYDROGENASE: G-6PDH: 15.2 U/g Hgb (ref 7.0–20.5)

## 2019-05-10 ENCOUNTER — Telehealth: Payer: Self-pay | Admitting: Family Medicine

## 2019-05-10 MED ORDER — BLOOD GLUCOSE METER KIT: PACK | 0 refills | Status: DC

## 2019-05-10 MED ORDER — DAPAGLIFLOZIN PROPANEDIOL 5 MG PO TABS: 5.0000 mg | ORAL_TABLET | Freq: Every day | ORAL | 5 refills | Status: DC

## 2019-05-10 NOTE — Telephone Encounter (Signed)
Pt called in regarding below and said pharmacy advised him the farxiga and glucose supplies require PA. Pt said that if there is an insulin pen (self dispensing) that would be easier/cheaper and may not require PA he would be willing to go that route so he can get glucose level down. Pt worried about delaying monitoring BS.  Please f/u Monday.

## 2019-05-10 NOTE — Telephone Encounter (Signed)
Contacted patient regarding results and PCP recommendations. Patient interrupted me several times in anger that A1c had gone up so much. He stated he wanted Dr.Kuneff to know if she could not get this more under control he would have his insurance find a diabetes specialist. Unable to assist with scheduling 3 month follow up, patient refused.

## 2019-05-10 NOTE — Telephone Encounter (Signed)
Called patient and had a frank conversation with him concerning his diabetes.   1. He Must take responsibility for his worsening diabetes. We increased his regimen last visit and his a1c went up.  2. We will not tolerate verbal abuse in this office.   - he is not monitoring his glucose levels any longer (He was a few weeks ago-but he states now he does not have a monitor that works). Please call  in a new glucose monitor and supplies. He is to check in the morning fasting and random check afternoon- write down and bring to his next appt.  - he is to start the new added medicine farxiga and if glucose fasting still above > 200 after 1 week of using then he is to increase to farxiga  10 mg a day.  - Offered endocrine referral now or at anytime he desires to see one in the future- all he has to do is ask. He declined for now.  - lengthy discussion on his choice of snacks since COVID19 and his DUI (preventing him from leaving the house) are full of carbohydrates and sugar- he reported "nibbling" on crackers and raisin brand. Discussed vegetable and protein snacks. Educated him on carbohydrates (anything made with flour, rice or potato) and sugar. Will place diabetic education referral- warned him his insurance may not cover.  - advised him if we do not see a response in his sugars within 4 weeks on current medicine then he will need insulin.   - he is to follow up in 4 weeks with glucose logs. Please call and schedule him and review the bold above so he remembers. Please call in generic covered by insurance glucose monitor and supplies for x2 daily monitoring.     - He apologized for taking out his frustration on this provider and staff.

## 2019-05-10 NOTE — Telephone Encounter (Signed)
Pt was called and detailed message was left on answering machine that Prior auth was needing to be done and I would start that. Pt was made aware that glucometer and supplies were called into the pharmacy. Will follow up with patient next week after prior auth is approved or denied.

## 2019-05-10 NOTE — Addendum Note (Signed)
Addended by: Caroll Rancher L on: 05/10/2019 03:51 PM   Modules accepted: Orders

## 2019-05-10 NOTE — Telephone Encounter (Signed)
Please inform patient the following information: -He had mildly low sodium and elevated A1c on his lab collection. -A1c 11.8, which is much higher than his prior collection of 9.7. >>>  Continue the combination pill Janumet, continue the glipizide 10 mg twice a day and start new medication prescribed called farxiga once a day.    -Please make sure he is taking the combination pill Janumet and not his prior medication of plain metformin.    -Make sure he has follow-up scheduled in 3 months. -Mildly low sodium can cause dizziness >>> encourage him to drink one low sugar Gatorade daily (g2) or a V8 juice which will help maintain his electrolytes.

## 2019-05-13 ENCOUNTER — Telehealth: Payer: Self-pay | Admitting: Family Medicine

## 2019-05-13 ENCOUNTER — Telehealth: Payer: Self-pay | Admitting: Plastic Surgery

## 2019-05-13 DIAGNOSIS — IMO0001 Reserved for inherently not codable concepts without codable children: Secondary | ICD-10-CM

## 2019-05-13 MED ORDER — DAPAGLIFLOZIN PROPANEDIOL 5 MG PO TABS: 5.0000 mg | ORAL_TABLET | Freq: Every day | ORAL | 5 refills | Status: DC

## 2019-05-13 MED ORDER — STEGLATRO 5 MG PO TABS: ORAL_TABLET | ORAL | 5 refills | Status: DC

## 2019-05-13 NOTE — Telephone Encounter (Signed)
Glucose supplies should not be a prior auth for uncontrolled diabetic. There should be one his formulary that does not need a PA.

## 2019-05-13 NOTE — Telephone Encounter (Signed)
Pt is requesting call back to discuss solutions for high blood glucose levels. Please advise. [phone]

## 2019-05-13 NOTE — Telephone Encounter (Signed)
Please call pharmacy and check on the status of the Leslie called in on Friday.  Patient needs to start another diabetic medication ASAP.  Her unable to get Nemiah Commander- then will need to see if Jardiance or invokana are affordable options.  I have placed a diabetic education/nutrition referral for him.  I have chose the [place] location, since the [place] location will likely be further away. Please make sure he is scheduled for follow-up in 1 month and prior phone note instructions were provided to him in detail.

## 2019-05-13 NOTE — Telephone Encounter (Signed)

## 2019-05-13 NOTE — Addendum Note (Signed)
Addended by: Caroll Rancher L on: 05/13/2019 03:00 PM   Modules accepted: Orders

## 2019-05-13 NOTE — Addendum Note (Signed)
Addended by: Howard Pouch A on: 05/13/2019 09:47 AM   Modules accepted: Orders

## 2019-05-13 NOTE — Telephone Encounter (Signed)
I have called in the reported medication that is on the better tear that is in the same class of the medication that I was going to call in prior.  It is called Catering manager.  He is to take 1 tab daily (continuing his Janumet daily and glipizide twice daily).  Monitor his blood sugars and if his fasting blood sugars (in the morning before eating or drinking anything) and if they are above 150 after 7 days on new med>>> then he is to take 2 tabs daily (same time) Steglatro. If < 150 stay on 5 mg dose (1 tab).  Make sure he is getting all the info in the other phone notes. I can not tell by documentation if that has occurred. Make sure he has follow up in 1 mo.

## 2019-05-13 NOTE — Telephone Encounter (Signed)
Spoke with pharmacy and they stated that Wilder Glade was approved and is cheaper than Tier 2 med. Sent to pharmacy. Pt was called and given information and addressed checking sugars each morning (fasting) and once he starts medication he is to take check sugars x7 days and if >200 then he is to take two pills (10mg ) daily. Pt verbalized understanding on all instructions. Pt was offered a nutritionist referral, which he declined at this time.

## 2019-05-13 NOTE — Telephone Encounter (Signed)
Prior auth for medication was sent as urgent request. [employer] stated all medications of that class are a tier 3 medication and the only option as a tier 2 would be Catering manager. There are no Tier 1 meds in this class. The cost of Wilder Glade will be based on pts deductable, etc. The only way to know the cost is after approval and ran at pharmacy once approved.

## 2019-05-14 ENCOUNTER — Other Ambulatory Visit (HOSPITAL_COMMUNITY)
Admission: RE | Admit: 2019-05-14 | Discharge: 2019-05-14 | Disposition: A | Discharge status: Home / Self Care | Payer: PRIVATE HEALTH INSURANCE | Source: Ambulatory Visit | Attending: Plastic Surgery | Admitting: Plastic Surgery

## 2019-05-14 ENCOUNTER — Other Ambulatory Visit: Payer: Self-pay

## 2019-05-14 ENCOUNTER — Encounter: Payer: Self-pay | Admitting: Plastic Surgery

## 2019-05-14 ENCOUNTER — Ambulatory Visit (INDEPENDENT_AMBULATORY_CARE_PROVIDER_SITE_OTHER): Payer: PRIVATE HEALTH INSURANCE | Admitting: Plastic Surgery

## 2019-05-14 VITALS — BP 113/68 | HR 63 | Temp 98.4°F | Ht 71.0 in | Wt 213.8 lb

## 2019-05-14 DIAGNOSIS — H5789 Other specified disorders of eye and adnexa: Secondary | ICD-10-CM

## 2019-05-14 NOTE — Progress Notes (Signed)
Preoperative Dx: right lower eyelid cyst  Postoperative Dx: Same  Procedure: excision of right lower eyelid cyst 6 mm  Surgeon: Dr. Lyndee Leo Dillingham  Anesthesia: Lidocaine 1% with 1:100,000 epinepherine   Description of Procedure: Risks and complications were explained to the patient.  Consent was confirmed.  Time out was called and all information was confirmed to be correct.  The area was prepped with betadine and drapped.  Lidocaine 1% with epinepherine was injected in the subcutaneous area.  After waiting several minutes for the lidocaine to take affect a #15 blade was used to excise the area in an eliptical pattern.  A 6-0 Monocryl was used to close the skin edges.  Steri strips were applied.  The patient is to follow up in one week.  He tolerated the procedure well and there were no complications. The specimen was sent to pathology.

## 2019-05-14 NOTE — Addendum Note (Signed)
Addended by: Wallace Going on: 05/14/2019 04:54 PM   Modules accepted: Orders

## 2019-05-21 ENCOUNTER — Telehealth: Payer: Self-pay | Admitting: Plastic Surgery

## 2019-05-21 NOTE — Telephone Encounter (Signed)

## 2019-05-22 ENCOUNTER — Other Ambulatory Visit: Payer: Self-pay

## 2019-05-22 ENCOUNTER — Ambulatory Visit (INDEPENDENT_AMBULATORY_CARE_PROVIDER_SITE_OTHER): Payer: PRIVATE HEALTH INSURANCE | Admitting: Plastic Surgery

## 2019-05-22 VITALS — BP 112/69 | HR 54 | Temp 98.3°F | Ht 71.0 in | Wt 205.0 lb

## 2019-05-22 DIAGNOSIS — H5789 Other specified disorders of eye and adnexa: Secondary | ICD-10-CM

## 2019-05-22 NOTE — Progress Notes (Signed)
Subjective:     Patient ID: [name], male    DOB: November 07, 1956, 63 y.o.   MRN: [id number]  Chief Complaint  Patient presents with  . Follow-up    Eyelid lesion excision    HPI: The patient is a 63 y.o. yrs old male here for follow-up on a right lower eyelid cyst excision on May 14, 2019.  Patient is doing well with no concerns.  Sutures removed today. Derm Pathology showed hidrocystoma. The incision was clean dry and intact, no signs of infection.  Review of Systems  Constitutional: Negative.   HENT: Negative.   Eyes: Negative.  Negative for blurred vision, pain, discharge and redness.  Respiratory: Negative.   Cardiovascular: Negative.   Skin: Negative.  Negative for rash.  Neurological: Negative.     Past Medical History: Allergies: Allergies  Allergen Reactions  . Hydrocodone Other (See Comments)    PT reports  Confusion when tacking Hydrocodone  . Nsaids     Aspirin/Plavix therapy  . Penicillins Other (See Comments)    Childhood unsure if still allgeric    Current Medications:  Current Outpatient Medications:  .  aspirin EC 81 MG tablet, Take 1 tablet (81 mg total) by mouth daily., Disp: 90 tablet, Rfl: 3 .  blood glucose meter kit and supplies, check blood sugar in the morning fasting and random check in the afternoon, Disp: 1 each, Rfl: 0 .  clopidogrel (PLAVIX) 75 MG tablet, , Disp: , Rfl:  .  dapagliflozin propanediol (FARXIGA) 5 MG TABS tablet, Take 5 mg by mouth daily., Disp: 30 tablet, Rfl: 5 .  diazepam (VALIUM) 5 MG tablet, Take 1 tablet (5 mg total) by mouth every 8 (eight) hours as needed for anxiety., Disp: 90 tablet, Rfl: 5 .  glipiZIDE (GLUCOTROL) 10 MG tablet, Take 2 tablets (20 mg total) by mouth daily., Disp: 60 tablet, Rfl: 2 .  lisinopril-hydrochlorothiazide (ZESTORETIC) 20-25 MG tablet, Take 2 tablets by mouth daily., Disp: 60 tablet, Rfl: 5 .  metoprolol succinate (TOPROL-XL) 25 MG 24 hr tablet, Take 1 tablet (25 mg total) by mouth daily.,  Disp: 30 tablet, Rfl: 5 .  Omega 3 1000 MG CAPS, Take 1 capsule (1,000 mg total) by mouth daily., Disp: 30 each, Rfl: 11 .  rosuvastatin (CRESTOR) 20 MG tablet, Take 1 tablet (20 mg total) by mouth daily., Disp: 90 tablet, Rfl: 3 .  SitaGLIPtin-MetFORMIN HCl [phone] MG TB24, Take 1 tablet by mouth daily., Disp: 30 tablet, Rfl: 2 .  traZODone (DESYREL) 50 MG tablet, Take 1 tablet (50 mg total) by mouth at bedtime as needed for sleep., Disp: 30 tablet, Rfl: 5  Past Medical Problems: Past Medical History:  Diagnosis Date  . Alcohol addiction ([place])   . Anxiety   . Arthritis   . CAD (coronary artery disease)   . Coronary artery disease with hx of myocardial infarct w/o hx of CABG 2004  . Depression   . Dextrocardia   . Diabetes ([place])   . Heart attack [facility]) 2004   Stent placement 2008  . Hyperlipidemia   . Hypertension   . Insomnia     Past Surgical History: Past Surgical History:  Procedure Laterality Date  . CORONARY ANGIOPLASTY WITH STENT PLACEMENT  2008     Objective:   Vital Signs BP 112/69 (BP Location: Left Arm, Patient Position: Sitting, Cuff Size: Normal)   Pulse (!) 54   Temp 98.3 F (36.8 C) (Oral)   Ht '5\' 11"'  (1.803 m)  Wt 205 lb (93 kg)   SpO2 97%   BMI 28.59 kg/m  Vital Signs and Nursing Note Reviewed   Physical Exam Constitutional:      Appearance: Normal appearance.  HENT:     Head: Normocephalic and atraumatic.  Eyes:     General:        Right eye: No discharge.        Left eye: No discharge.     Extraocular Movements: Extraocular movements intact.     Conjunctiva/sclera: Conjunctivae normal.     Pupils: Pupils are equal, round, and reactive to light.  Neck:     Musculoskeletal: Normal range of motion.  Cardiovascular:     Rate and Rhythm: Normal rate.     Pulses: Normal pulses.  Pulmonary:     Effort: Pulmonary effort is normal.  Musculoskeletal: Normal range of motion.  Skin:    General: Skin is warm and dry.     Findings: No rash.   Neurological:     General: No focal deficit present.     Mental Status: He is alert and oriented to person, place, and time.     Gait: Gait normal.  Psychiatric:        Mood and Affect: Mood normal.        Behavior: Behavior normal.    Assessment/Plan:     ICD-10-CM   1. Cyst of eye  H57.89     Mr. Teeple is doing well and has no concerns.  His incision has been healing well and has no signs of infection. He was instructed to massage the incision site throughout the day to promote healing and minimize scarring. Continue to eat healthy and drink plenty of water to promote healing.   He was instructed to return or call if he has any questions or concerns.   Carola Rhine Briana Farner, PA-C 05/22/2019, 2:54 PM

## 2019-05-23 ENCOUNTER — Telehealth: Payer: Self-pay | Admitting: Family Medicine

## 2019-05-23 NOTE — Telephone Encounter (Signed)
Pt is having trouble urinating and stated he has not urinated since Monday night or Tuesday morning. Pt has not had BM since Tuesday. Pt is not running fever but is having chills. Pt was advised to go to the emergency room to be evaluated as that is an emergency situation. Pt got upset on the phone and then stated he did not tell me he has not urinated since yesterday and he did some yesterday and used his hands to "force himsel

## 2019-05-23 NOTE — Telephone Encounter (Signed)
Patient called and said that he would like to wish to talk to [name] about issues he is having trouble with going to the bathroom due to his medications. Please call patient back, thanks.

## 2019-05-23 NOTE — Telephone Encounter (Signed)
Cont from previous note:  Pt stated he did not say Monday night or Tuesday morning and its been since yesterday because he forced himself to pee some yesterday. Pt states he just wants to know what he can take or if something can be RX to make him have a BM. Pt was advised that he needed to go to the ED due to not knowing how much he emptied his bladder and that is an emergency situation. Pt stated if you cant do anything for then that's fine and hung up the phone.

## 2019-05-24 ENCOUNTER — Other Ambulatory Visit: Payer: Self-pay | Admitting: Family Medicine

## 2019-05-24 DIAGNOSIS — IMO0001 Reserved for inherently not codable concepts without codable children: Secondary | ICD-10-CM

## 2019-05-24 NOTE — Telephone Encounter (Signed)
Pharmacy was called and they did not get Glipizide last month. Aprroved RX. I do not see that we have RX Plavix for patient. Please advise,

## 2019-05-24 NOTE — Telephone Encounter (Signed)
Cardiology discontinued the plavix at his visit with them in May.  [name] had voiced concern that he may not want to stop it at our appt this month. If he wants to continue the plavix, it still needs to come from cardiology if they agree it is appropriate. There are bleeding risk to continued Asprin  and plavix, that is why it used only for the length of time together that is absolutely needed and then patients are transitioned to Asprin alone.

## 2019-05-29 ENCOUNTER — Other Ambulatory Visit: Payer: PRIVATE HEALTH INSURANCE

## 2019-05-31 ENCOUNTER — Ambulatory Visit (HOSPITAL_COMMUNITY)
Admission: EM | Admit: 2019-05-31 | Discharge: 2019-05-31 | Disposition: A | Discharge status: Home / Self Care | Payer: PRIVATE HEALTH INSURANCE | Attending: Family Medicine | Admitting: Family Medicine

## 2019-05-31 ENCOUNTER — Other Ambulatory Visit: Payer: Self-pay

## 2019-05-31 ENCOUNTER — Encounter (HOSPITAL_COMMUNITY): Payer: Self-pay

## 2019-05-31 DIAGNOSIS — M545 Low back pain, unspecified: Secondary | ICD-10-CM

## 2019-05-31 DIAGNOSIS — G8929 Other chronic pain: Secondary | ICD-10-CM | POA: Diagnosis not present

## 2019-05-31 MED ORDER — KETOROLAC TROMETHAMINE 60 MG/2ML IM SOLN
INTRAMUSCULAR | Status: AC
  Filled 2019-05-31: qty 2

## 2019-05-31 MED ORDER — NAPROXEN 500 MG PO TABS: 500.0000 mg | ORAL_TABLET | Freq: Two times a day (BID) | ORAL | 0 refills | Status: AC

## 2019-05-31 MED ORDER — KETOROLAC TROMETHAMINE 60 MG/2ML IM SOLN
60.0000 mg | Freq: Once | INTRAMUSCULAR | Status: AC
  Administered 2019-05-31: 60 mg via INTRAMUSCULAR

## 2019-05-31 NOTE — ED Triage Notes (Signed)
Pt presents with chronic sciatic pain on left side that has not resolved with rest.

## 2019-05-31 NOTE — ED Provider Notes (Signed)
[facility]    CSN: [id number] Arrival date & time: 05/31/19  7106     History   Chief Complaint Chief Complaint  Patient presents with  . Sciatic Pain    HPI [name] is a 63 y.o. male history of diabetes, CAD, arthritis, hypertension presenting for left-sided low back pain.  Patient states he has chronic low back pain, the "it flares up every now".  Patient states that he has been walking 20 miles weekly for the last few weeks due to a "try to control blood sugars better ".  Patient states that 2 days ago he was walking down a hill when he could feel his back tightening.  Patient denies pop/snap/tear sensation; no fall, trauma to the area.  Patient is to ambulate without significant limp pain.  Patient denies radiation of pain, saddle anesthesia, urinary fecal incontinence, lower extremity paresthesias, weakness.  She states that he tried 1 of his sisters muscle relaxers which did not help him, "it just made me really sleepy ".  Patient has not otherwise tried anything for this, states he has been given cortisone injections in the past.    Past Medical History:  Diagnosis Date  . Alcohol addiction ([place])   . Anxiety   . Arthritis   . CAD (coronary artery disease)   . Coronary artery disease with hx of myocardial infarct w/o hx of CABG 2004  . Depression   . Dextrocardia   . Diabetes ([place])   . Heart attack [facility]) 2004   Stent placement 2008  . Hyperlipidemia   . Hypertension   . Insomnia     Patient Active Problem List   Diagnosis Date Noted  . Cyst of eye 04/11/2019  . Mild nonproliferative diabetic retinopathy ([place]) 01/09/2019  . Overweight (BMI 25.0-29.9) 11/29/2018  . Generalized anxiety disorder 09/02/2017  . Vasculogenic erectile dysfunction 09/02/2017  . Murmur 12/28/2015  . Insomnia 12/28/2015  . Diabetic peripheral neuropathy ([place]) 12/21/2015  . Uncontrolled type 2 diabetes mellitus without complication, without long-term current use of insulin  ([place]) 07/07/2014  . S/P coronary artery stent placement 03/31/2014  . Coronary artery disease with history of myocardial infarction without history of CABG 03/31/2014  . Essential hypertension 03/28/2014  . HYPERCHOLESTEROLEMIA 11/08/2010    Past Surgical History:  Procedure Laterality Date  . CORONARY ANGIOPLASTY WITH STENT PLACEMENT  2008       Home Medications    Prior to Admission medications   Medication Sig Start Date End Date Taking? Authorizing Provider  aspirin EC 81 MG tablet Take 1 tablet (81 mg total) by mouth daily. 03/25/19   Lelon Perla, MD  blood glucose meter kit and supplies check blood sugar in the morning fasting and random check in the afternoon 05/10/19   Kuneff, Renee A, DO  dapagliflozin propanediol (FARXIGA) 5 MG TABS tablet Take 5 mg by mouth daily. 05/13/19   Kuneff, Renee A, DO  diazepam (VALIUM) 5 MG tablet Take 1 tablet (5 mg total) by mouth every 8 (eight) hours as needed for anxiety. 04/24/19   Kuneff, Renee A, DO  glipiZIDE (GLUCOTROL) 10 MG tablet TAKE  (1)  TABLET TWICE A DAY. 05/24/19   Kuneff, Renee A, DO  lisinopril-hydrochlorothiazide (ZESTORETIC) 20-25 MG tablet Take 2 tablets by mouth daily. 04/26/19   Kuneff, Renee A, DO  metoprolol succinate (TOPROL-XL) 25 MG 24 hr tablet Take 1 tablet (25 mg total) by mouth daily. 04/24/19   Kuneff, Renee A, DO  naproxen (NAPROSYN) 500  MG tablet Take 1 tablet (500 mg total) by mouth 2 (two) times daily for 7 days. 05/31/19 06/07/19  Hall-Potvin, [place], PA-C  Omega 3 1000 MG CAPS Take 1 capsule (1,000 mg total) by mouth daily. 02/13/19   Kuneff, Renee A, DO  rosuvastatin (CRESTOR) 20 MG tablet Take 1 tablet (20 mg total) by mouth daily. 03/25/19 06/23/19  Lelon Perla, MD  SitaGLIPtin-MetFORMIN HCl [phone] MG TB24 Take 1 tablet by mouth daily. 04/24/19   Kuneff, Renee A, DO  traZODone (DESYREL) 50 MG tablet Take 1 tablet (50 mg total) by mouth at bedtime as needed for sleep. 04/24/19   Ma Hillock, DO     Family History Family History  Problem Relation Age of Onset  . Heart attack Mother   . Heart disease Mother   . Diabetes Mother   . Arthritis Mother   . Hyperlipidemia Mother   . Hypertension Mother   . Breast cancer Mother   . Heart attack Father   . Heart disease Father   . Alcohol abuse Father   . Early death Father   . Arthritis Sister   . Heart disease Sister   . Hyperlipidemia Sister   . Asthma Sister   . COPD Sister   . Diabetes Sister   . Alcohol abuse Sister     Social History Social History   Tobacco Use  . Smoking status: Never Smoker  . Smokeless tobacco: Never Used  Substance Use Topics  . Alcohol use: Yes    Alcohol/week: 0.0 standard drinks    Comment: Occasional  . Drug use: Not Currently    Types: Cocaine, Marijuana    Comment: Former     Allergies   Hydrocodone, Nsaids, and Penicillins   Review of Systems As per HPI   Physical Exam Triage Vital Signs ED Triage Vitals  Enc Vitals Group     BP 05/31/19 0947 111/66     Pulse Rate 05/31/19 0947 67     Resp 05/31/19 0947 17     Temp 05/31/19 0947 97.6 F (36.4 C)     Temp Source 05/31/19 0947 Oral     SpO2 05/31/19 0947 97 %     Weight --      Height --      Head Circumference --      Peak Flow --      Pain Score 05/31/19 0949 8     Pain Loc --      Pain Edu? --      Excl. in [place]? --    No data found.  Updated Vital Signs BP 111/66 (BP Location: Right Arm)   Pulse 67   Temp 97.6 F (36.4 C) (Oral)   Resp 17   SpO2 97%   Visual Acuity Right Eye Distance:   Left Eye Distance:   Bilateral Distance:    Right Eye Near:   Left Eye Near:    Bilateral Near:     Physical Exam Constitutional:      General: He is not in acute distress. HENT:     Head: Normocephalic and atraumatic.  Eyes:     General: No scleral icterus.    Pupils: Pupils are equal, round, and reactive to light.  Cardiovascular:     Rate and Rhythm: Normal rate.  Pulmonary:     Effort: Pulmonary effort  is normal.  Musculoskeletal:     Lumbar back: He exhibits tenderness. He exhibits normal range of motion, no bony tenderness, no swelling,  no edema, no deformity, no laceration, no pain and no spasm.     Comments: Patient tender over PSIS and surrounding musculature no paraspinal or vertebral tenderness.  No fluctuance, mass, deformity observed.  Negative straight leg raise.  Lower extremity strength 5/5 bilaterally and symmetric.  Patellar DTR 2+ bilaterally.  Skin:    Coloration: Skin is not jaundiced or pale.  Neurological:     Mental Status: He is alert and oriented to person, place, and time.      UC Treatments / Results  Labs (all labs ordered are listed, but only abnormal results are displayed) Labs Reviewed - No data to display  EKG   Radiology No results found.  Procedures Procedures (including critical care time)  Medications Ordered in UC Medications  ketorolac (TORADOL) injection 60 mg (has no administration in time range)  ketorolac (TORADOL) 60 MG/2ML injection (has no administration in time range)    Initial Impression / Assessment and Plan / UC Course  I have reviewed the triage vital signs and the nursing notes.  Pertinent labs & imaging results that were available during my care of the patient were reviewed by me and considered in my medical decision making (see chart for details).     63 year old male with history of CAD, hypertension, diabetes presenting for acute concern of acute on chronic left-sided low back pain.  Likely due to increased exertion.  Given history of uncontrolled diabetes (last A1c in June was greater than 11%) steroids will be deferred, opt for Naprosyn outpatient.  Patient given Toradol injection in office, which he tolerated well.  Discussed increased bleeding risk with NSAID use as well as return precautions.  Patient is agreeable to plan, verbalized understanding. Final Clinical Impressions(s) / UC Diagnoses   Final diagnoses:   Chronic left-sided low back pain without sciatica     Discharge Instructions     Take naprosyn as directed. Stop medication if you notice easy bruising/bleeding, blood in your urine or stool, or develop severe abdominal pain.    ED Prescriptions    Medication Sig Dispense Auth. Provider   naproxen (NAPROSYN) 500 MG tablet Take 1 tablet (500 mg total) by mouth 2 (two) times daily for 7 days. 14 tablet Hall-Potvin, [place], PA-C     Controlled Substance Prescriptions [place] Controlled Substance Registry consulted? Not Applicable   Quincy Sheehan, [place] 05/31/19 1049

## 2019-05-31 NOTE — Discharge Instructions (Signed)
Take naprosyn as directed. Stop medication if you notice easy bruising/bleeding, blood in your urine or stool, or develop severe abdominal pain.

## 2019-06-04 ENCOUNTER — Encounter

## 2019-06-04 ENCOUNTER — Other Ambulatory Visit: Payer: Self-pay

## 2019-06-04 ENCOUNTER — Encounter: Payer: PRIVATE HEALTH INSURANCE | Attending: Family Medicine | Admitting: Nutrition

## 2019-06-04 DIAGNOSIS — I10 Essential (primary) hypertension: Secondary | ICD-10-CM | POA: Insufficient documentation

## 2019-06-04 DIAGNOSIS — I2510 Atherosclerotic heart disease of native coronary artery without angina pectoris: Secondary | ICD-10-CM | POA: Insufficient documentation

## 2019-06-04 DIAGNOSIS — I252 Old myocardial infarction: Secondary | ICD-10-CM | POA: Insufficient documentation

## 2019-06-04 DIAGNOSIS — E1142 Type 2 diabetes mellitus with diabetic polyneuropathy: Secondary | ICD-10-CM | POA: Insufficient documentation

## 2019-06-04 NOTE — Progress Notes (Signed)
  Medical Nutrition Therapy:  Appt start time: 1751 end time:  1630.   Assessment:  Primary concerns today: Diabetes Type 2. Lives by himself. Eats 2-3 meals per day.  Has recently has stopped alcohol recently.  Recently changed some eating habits, cutting down on carbs, walks 3-4 miles a day. Currenlty taking Fargaixa, Janument 100/100 mg/dl once a day, 2-10 mg of Glipizide. Since starting Farxiga 05-19-19, BS are now under 190's.  Takes Valium daily.  Admits to having issues witih depression. Lose his wife in January 2020. Has lost his license due to DWI. FBS 169,   102 after breakfast and exercise.  Avg last week as 156 mg/dl. Has lost about 9 lbs. 213 lbs- 205 lbs. Had to go urgent care for leg issues.. Previous A1C was 9%.  Lab Results  Component Value Date   HGBA1C 11.8 (H) 05/08/2019   .  Preferred Learning Style:    No preference indicated   Learning Readiness:  Ready  Change in progress   MEDICATIONS:   DIETARY INTAKE:  24-hr recall:  B ( AM): Blueberries, strawberries  Snk ( AM):   L ( PM): Salad, cheese, [place] bacon,low sugar ham. Snk ( PM): D ( PM): chicken salad and veggie chips, blueberries Snk ( PM): Beverages: water, diet soda,   Usual physical activity:  Walks.  Estimated energy needs: 1800 calories 200g carbohydrates 135 g protein 59 g fat  Progress Towards Goal(s):  In progress.   Nutritional Diagnosis:  NB-1.1 Food and nutrition-related knowledge deficit As related to Diabetes Type 2.  As evidenced by A1C >10%..    Intervention: Nutrition and Diabetes education provided on My Plate, CHO counting, meal planning, portion sizes, timing of meals, avoiding snacks between meals unless having a low blood sugar, target ranges for A1C and blood sugars, signs/symptoms and treatment of hyper/hypoglycemia, monitoring blood sugars, taking medications as prescribed, benefits of exercising 30 minutes per day and prevention of complications of  DM.  Goals Follow My Plate  Eat 2-3 carb choices per meal Drink only water Cut out snacks between meals Keep walking.daily Eat meals on time Goal FBS less htan 130 mg/dl and Less   Teaching Method Utilized:  Visual Auditory Hands on  Handouts given during visit include:  The Plate Method   Barriers to learning/adherence to lifestyle change: none  Demonstrated degree of understanding via:  Teach Back   Monitoring/Evaluation:  Dietary intake, exercise,  and body weight 1 month.

## 2019-06-04 NOTE — Patient Instructions (Addendum)
Goals Follow My Plate  Eat 2-3 carb choices per meal Drink only water Cut out snacks between meals Keep walking.daily Eat meals on time Goal FBS less htan 130 mg/dl and Less

## 2019-06-10 ENCOUNTER — Ambulatory Visit: Payer: PRIVATE HEALTH INSURANCE | Admitting: Family Medicine

## 2019-06-12 ENCOUNTER — Telehealth: Payer: Self-pay

## 2019-06-12 NOTE — Telephone Encounter (Signed)
Called patient because we received a fax refill request from [facility]. Patient said that he has enough medication and refills to last him. He has an appointment with [place] tomorrow 06/13/19. He did state that all his medications will need to go to [facility] starting in August.

## 2019-06-13 ENCOUNTER — Other Ambulatory Visit: Payer: Self-pay

## 2019-06-13 ENCOUNTER — Encounter: Payer: Self-pay | Admitting: Nutrition

## 2019-06-13 ENCOUNTER — Ambulatory Visit (INDEPENDENT_AMBULATORY_CARE_PROVIDER_SITE_OTHER): Payer: PRIVATE HEALTH INSURANCE | Admitting: Family Medicine

## 2019-06-13 ENCOUNTER — Encounter: Payer: Self-pay | Admitting: Family Medicine

## 2019-06-13 VITALS — BP 114/68 | HR 86 | Temp 97.8°F | Resp 17 | Ht 71.0 in | Wt 205.1 lb

## 2019-06-13 DIAGNOSIS — G4700 Insomnia, unspecified: Secondary | ICD-10-CM

## 2019-06-13 DIAGNOSIS — E1165 Type 2 diabetes mellitus with hyperglycemia: Secondary | ICD-10-CM

## 2019-06-13 DIAGNOSIS — I10 Essential (primary) hypertension: Secondary | ICD-10-CM

## 2019-06-13 DIAGNOSIS — E7800 Pure hypercholesterolemia, unspecified: Secondary | ICD-10-CM

## 2019-06-13 DIAGNOSIS — I119 Hypertensive heart disease without heart failure: Secondary | ICD-10-CM | POA: Diagnosis not present

## 2019-06-13 DIAGNOSIS — I2510 Atherosclerotic heart disease of native coronary artery without angina pectoris: Secondary | ICD-10-CM | POA: Diagnosis not present

## 2019-06-13 DIAGNOSIS — I252 Old myocardial infarction: Secondary | ICD-10-CM

## 2019-06-13 DIAGNOSIS — E663 Overweight: Secondary | ICD-10-CM

## 2019-06-13 DIAGNOSIS — IMO0001 Reserved for inherently not codable concepts without codable children: Secondary | ICD-10-CM

## 2019-06-13 DIAGNOSIS — Z955 Presence of coronary angioplasty implant and graft: Secondary | ICD-10-CM

## 2019-06-13 DIAGNOSIS — M545 Low back pain, unspecified: Secondary | ICD-10-CM

## 2019-06-13 DIAGNOSIS — E1142 Type 2 diabetes mellitus with diabetic polyneuropathy: Secondary | ICD-10-CM

## 2019-06-13 DIAGNOSIS — E113293 Type 2 diabetes mellitus with mild nonproliferative diabetic retinopathy without macular edema, bilateral: Secondary | ICD-10-CM

## 2019-06-13 DIAGNOSIS — F411 Generalized anxiety disorder: Secondary | ICD-10-CM

## 2019-06-13 LAB — POCT GLYCOSYLATED HEMOGLOBIN (HGB A1C)
HbA1c POC (<> result, manual entry): 8.5 % (ref 4.0–5.6)
HbA1c, POC (controlled diabetic range): 8.5 % — AB (ref 0.0–7.0)
HbA1c, POC (prediabetic range): 8.5 % — AB (ref 5.7–6.4)
Hemoglobin A1C: 8.5 % — AB (ref 4.0–5.6)

## 2019-06-13 MED ORDER — ACCU-CHEK GUIDE VI STRP: ORAL_STRIP | 12 refills | Status: DC

## 2019-06-13 MED ORDER — METHYLPREDNISOLONE ACETATE 80 MG/ML IJ SUSP
80.0000 mg | Freq: Once | INTRAMUSCULAR | Status: AC
  Administered 2019-06-13: 80 mg via INTRAMUSCULAR

## 2019-06-13 NOTE — Patient Instructions (Addendum)
Pleasure to see you today. Your a1c is looking much better at 8.5 today. Keep the medicine the same.  Blood pressure looks great also.   I have refilled your meds. To your mail in pharmacy.  Follow up on 3 months.     Diabetes Mellitus and Exercise Exercising regularly is important for your overall health, especially when you have diabetes (diabetes mellitus). Exercising is not only about losing weight. It has many other health benefits, such as increasing muscle strength and bone density and reducing body fat and stress. This leads to improved fitness, flexibility, and endurance, all of which result in better overall health. Exercise has additional benefits for people with diabetes, including:  Reducing appetite.  Helping to lower and control blood glucose.  Lowering blood pressure.  Helping to control amounts of fatty substances (lipids) in the blood, such as cholesterol and triglycerides.  Helping the body to respond better to insulin (improving insulin sensitivity).  Reducing how much insulin the body needs.  Decreasing the risk for heart disease by: ? Lowering cholesterol and triglyceride levels. ? Increasing the levels of good cholesterol. ? Lowering blood glucose levels. What is my activity plan? Your health care provider or certified diabetes educator can help you make a plan for the type and frequency of exercise (activity plan) that works for you. Make sure that you:  Do at least 150 minutes of moderate-intensity or vigorous-intensity exercise each week. This could be brisk walking, biking, or water aerobics. ? Do stretching and strength exercises, such as yoga or weightlifting, at least 2 times a week. ? Spread out your activity over at least 3 days of the week.  Get some form of physical activity every day. ? Do not go more than 2 days in a row without some kind of physical activity. ? Avoid being inactive for more than 30 minutes at a time. Take frequent breaks to  walk or stretch.  Choose a type of exercise or activity that you enjoy, and set realistic goals.  Start slowly, and gradually increase the intensity of your exercise over time. What do I need to know about managing my diabetes?   Check your blood glucose before and after exercising. ? If your blood glucose is 240 mg/dL (13.3 mmol/L) or higher before you exercise, check your urine for ketones. If you have ketones in your urine, do not exercise until your blood glucose returns to normal. ? If your blood glucose is 100 mg/dL (5.6 mmol/L) or lower, eat a snack containing 15-20 grams of carbohydrate. Check your blood glucose 15 minutes after the snack to make sure that your level is above 100 mg/dL (5.6 mmol/L) before you start your exercise.  Know the symptoms of low blood glucose (hypoglycemia) and how to treat it. Your risk for hypoglycemia increases during and after exercise. Common symptoms of hypoglycemia can include: ? Hunger. ? Anxiety. ? Sweating and feeling clammy. ? Confusion. ? Dizziness or feeling light-headed. ? Increased heart rate or palpitations. ? Blurry vision. ? Tingling or numbness around the mouth, lips, or tongue. ? Tremors or shakes. ? Irritability.  Keep a rapid-acting carbohydrate snack available before, during, and after exercise to help prevent or treat hypoglycemia.  Avoid injecting insulin into areas of the body that are going to be exercised. For example, avoid injecting insulin into: ? The arms, when playing tennis. ? The legs, when jogging.  Keep records of your exercise habits. Doing this can help you and your health care provider adjust your  diabetes management plan as needed. Write down: ? Food that you eat before and after you exercise. ? Blood glucose levels before and after you exercise. ? The type and amount of exercise you have done. ? When your insulin is expected to peak, if you use insulin. Avoid exercising at times when your insulin is  peaking.  When you start a new exercise or activity, work with your health care provider to make sure the activity is safe for you, and to adjust your insulin, medicines, or food intake as needed.  Drink plenty of water while you exercise to prevent dehydration or heat stroke. Drink enough fluid to keep your urine clear or pale yellow. Summary  Exercising regularly is important for your overall health, especially when you have diabetes (diabetes mellitus).  Exercising has many health benefits, such as increasing muscle strength and bone density and reducing body fat and stress.  Your health care provider or certified diabetes educator can help you make a plan for the type and frequency of exercise (activity plan) that works for you.  When you start a new exercise or activity, work with your health care provider to make sure the activity is safe for you, and to adjust your insulin, medicines, or food intake as needed. This information is not intended to replace advice given to you by your health care provider. Make sure you discuss any questions you have with your health care provider. Document Released: 02/04/2004 Document Revised: 06/08/2017 Document Reviewed: 04/25/2016 Elsevier Patient Education  2020 [employer]

## 2019-06-13 NOTE — Progress Notes (Signed)
Patient Care Team    Relationship Specialty Notifications Start End  Ma Hillock, DO PCP - General Family Medicine  11/29/18   Bernarda Caffey, MD Consulting Physician Ophthalmology  01/09/19      SUBJECTIVE Chief Complaint  Patient presents with  . Diabetes    readings are high in the morning and low in the evenings. insurance is going to change and all rx needs to go to [facility]. needs meter and supplies sent to mail order    HPI:  Depression/anxiety/insomnia: Patient had been on Paxil, he was breaking it in thirds.  He states he discontinued this medication because his anxiety is much better controlled than prior.  He is taking the Valium 3 times daily.  He is using trazodone 50 mg daily at bedtime.  He feels like he is doing pretty well on this combination. Original note: Patient reports he has been treated for his depression, anxiety and insomnia with Valium 5 mg many years.  He states he used to be treated with Xanax and that medication caused him to have difficulties with memory.  He states he takes his Valium about 3 times a day.  He used to only need the Valium twice a day.  Also has been prescribed Ambien, which she states does not work at all.  He reports having depression and anxiety greater than 15 to 20 years.  Feels his anxiety became much more intense after he found his girlfriend greater than 20 years dead in their bed from COPD complications.  He admits he had problems before she passed away, however she had always been able to calm him down. He states he has a rather routine day.  He does not have much social contact in a day.  He lives by himself.  He reports that he always feels like he "stays on edge.  "He reports when he wakes up he is already feeling on edge and he will take a Valium at that time.  He then has breakfast and goes on a 4 mile walk daily.  For lunch he starts to feel his anxiety sudden and he becomes panicked.  He states when he feels this way he will  break out in a sweat, become very fearful and scared.  He states he commonly fears there is someone in the house with a gun.  He goes to his bedroom gets in his bed and because his sister.  He states the fear is so intense and he just cannot control it.  He denies any known trauma or event experienced in his life that would create such a fear.  He states he did go see a counselor/psychiatrist at [facility] in the past 6 months, and he did not feel that was helpful for him.  Denies any SI or HI.  Diabetes, uncontrolled/overweight:  Patient reports he has been a diabetic since 1998.  He reports compliance with Janumet [phone], Farxiga 5 mg Qd and glipizide 10 mg daily.  He reported having elevation in fasting glucoses after start of Janumet, and glipizide 10 mg daily was added back a few weeks ago.  He has made a great deal of dietary changes after lengthy discussion on his dietary habits.  He has since met with the dietitian and he reports she has been helpful as well. Patient denies dizziness, hyperglycemic or hypoglycemic events. Patient denies numbness, tingling in the extremities or nonhealing wounds of feet.  PNA series: Pneumovax 23 03/28/2014--> repeat after August 2020 when he  will be 63 Flu shot:  Up to date 11/29/2018 (recommneded yearly) BMP: 01/30/2018 elevated LFTs;  very mild-which appeared new Foot exam: Completed 11/29/2018-referral to podiatry Eye exam:  Completed 01/08/2019-[facility] A1c:10.4>>>9.4>>  9.7>>11.8>> 8.5 (only w/ 1 mo of dietary changes and farixga)   Hypertension/CAD/S/p Stent/prior MI:  Pt has a significant cardiac history of "anterior MI in 2004 with stents was LAD and then perhaps stents to branch vessel 2008 (by DR. Renaldo- cardio note 2015)" Pt reports compliance with aspirin 81 mg, Plavix DCd by cards recently, 75, lisinopril-HCTZ 40- 50,  Crestor 20 mg mg daily, metoprolol 25 mg daily. Patient denies chest pain, shortness of breath, dizziness or lower extremity edema.  BMP:   01/2019 mild elevation in AST and glucose over 200 CBC:  11/2018 within normal limits Lipids: 02/12/2019 total cholesterol 149, HDL 39, LDL 87, triglycerides 216. TSH: 11/2018 within normal limits Diet: Low-sodium Exercise: Routine exercise daily RF: Hypertension, hyperlipidemia, diabetes, overweight, family history, CAD, MI  NM Heartspect Ph Stress- [facility]-07/10/2014   Other Result Information  Acute Interface, Incoming Rad Results - 07/10/2014 12:16 PM EDT TECHNIQUE: Nuclear pharmacological cardiac perfusion exam: INDICATION: Previous MI and coronary artery disease and dextrocardia TECHNIQUE: The patient was initially injected with 73.5 millicuries of technetium Cardiolite. SPECT imaging of the left ventricle was performed at rest. The patient received an additional 38 millicuries of technetium Cardiolite injected intravenously.  SPECT imaging of the left ventricle was then repeated. Resting ECG revealed normal sinus rhythm and a normal tracing. There were no ischemic ECG changes with Lexi scan infusion. FINDINGS: There is moderate size  fixed defect in the anterior apical region consistent with previous infarct.. No reversible perfusion defects are identified. The patient has dextrocardia so the images are reversed. IMPRESSION: Abnormal cardiac perfusion exam for previous anterior apical infarct with no evidence of reversible ischemia. Prognostically this is an abnormal, but low risk scan IMPRESSION: Left ventricular ejection fraction of 49%. FINDINGS: Dynamic imaging of the left ventricle demonstrates apical hypokinesis. IMPRESSION: Regional wall motion abnormality with mildly depressed left ventricular systolic function.    ROS: See pertinent positives and negatives per HPI.  Patient Active Problem List   Diagnosis Date Noted  . Cyst of eye 04/11/2019  . Mild nonproliferative diabetic retinopathy ([place]) 01/09/2019  . Overweight (BMI 25.0-29.9) 11/29/2018  . Generalized anxiety  disorder 09/02/2017  . Vasculogenic erectile dysfunction 09/02/2017  . Murmur 12/28/2015  . Insomnia 12/28/2015  . Diabetic peripheral neuropathy ([place]) 12/21/2015  . Uncontrolled type 2 diabetes mellitus without complication, without long-term current use of insulin ([place]) 07/07/2014  . S/P coronary artery stent placement 03/31/2014  . Coronary artery disease with history of myocardial infarction without history of CABG 03/31/2014  . Essential hypertension 03/28/2014  . HYPERCHOLESTEROLEMIA 11/08/2010    Social History   Tobacco Use  . Smoking status: Never Smoker  . Smokeless tobacco: Never Used  Substance Use Topics  . Alcohol use: Yes    Alcohol/week: 0.0 standard drinks    Comment: Occasional    Current Outpatient Medications:  .  Accu-Chek FastClix Lancets MISC, , Disp: , Rfl:  .  aspirin EC 81 MG tablet, Take 1 tablet (81 mg total) by mouth daily., Disp: 90 tablet, Rfl: 3 .  blood glucose meter kit and supplies, check blood sugar in the morning fasting and random check in the afternoon, Disp: 1 each, Rfl: 0 .  dapagliflozin propanediol (FARXIGA) 5 MG TABS tablet, Take 5 mg by mouth daily., Disp: 30 tablet, Rfl: 5 .  diazepam (VALIUM) 5 MG tablet, Take 1 tablet (5 mg total) by mouth every 8 (eight) hours as needed for anxiety., Disp: 90 tablet, Rfl: 5 .  glipiZIDE (GLUCOTROL) 10 MG tablet, TAKE  (1)  TABLET TWICE A DAY., Disp: 60 tablet, Rfl: 5 .  lisinopril-hydrochlorothiazide (ZESTORETIC) 20-25 MG tablet, Take 2 tablets by mouth daily., Disp: 60 tablet, Rfl: 5 .  metoprolol succinate (TOPROL-XL) 25 MG 24 hr tablet, Take 1 tablet (25 mg total) by mouth daily., Disp: 30 tablet, Rfl: 5 .  Omega 3 1000 MG CAPS, Take 1 capsule (1,000 mg total) by mouth daily., Disp: 30 each, Rfl: 11 .  rosuvastatin (CRESTOR) 20 MG tablet, Take 1 tablet (20 mg total) by mouth daily., Disp: 90 tablet, Rfl: 3 .  SitaGLIPtin-MetFORMIN HCl [phone] MG TB24, Take 1 tablet by mouth daily., Disp: 30 tablet,  Rfl: 2 .  traZODone (DESYREL) 50 MG tablet, Take 1 tablet (50 mg total) by mouth at bedtime as needed for sleep., Disp: 30 tablet, Rfl: 5 .  glucose blood (ACCU-CHEK GUIDE) test strip, Use to check sugars up to three times daily DX11.9, Disp: 100 each, Rfl: 12  Allergies  Allergen Reactions  . Hydrocodone Other (See Comments)    PT reports  Confusion when tacking Hydrocodone  . Nsaids     Aspirin/Plavix therapy  . Penicillins Other (See Comments)    Childhood unsure if still allgeric    OBJECTIVE: BP 114/68 (BP Location: Right Arm, Patient Position: Sitting, Cuff Size: Normal)   Pulse 86   Temp 97.8 F (36.6 C) (Temporal)   Resp 17   Ht '5\' 11"'  (1.803 m)   Wt 205 lb 2 oz (93 kg)   SpO2 95%   BMI 28.61 kg/m  Gen: Afebrile. No acute distress.  Nontoxic in presentation, overweight Caucasian male. HENT: AT. [place].  MMM.  Eyes:Pupils Equal Round Reactive to light, Extraocular movements intact,  Conjunctiva without redness, discharge or icterus. Neck/lymp/endocrine: Supple, no lymphadenopathy, no thyromegaly CV: RRR no murmur, no edema, +2/4 P posterior tibialis pulses Chest: CTAB, no wheeze or crackles Abd: Soft.  Overweight. NTND. BS present.  No masses palpated.  Skin: No rashes, purpura or petechiae.  Neuro: Normal gait. PERLA. EOMi. Alert. Oriented x3  Psych: Normal affect, dress and demeanor. Normal speech. Normal thought content and judgment.  Depression screen [facility] 2/9 06/04/2019 02/12/2019 11/29/2018  Decreased Interest 3 0 3  Down, Depressed, Hopeless '3 3 3  ' PHQ - 2 Score '6 3 6  ' Altered sleeping 3 0 3  Tired, decreased energy 3 0 3  Change in appetite 3 0 3  Feeling bad or failure about yourself  '1 1 3  ' Trouble concentrating 0 0 3  Moving slowly or fidgety/restless 0 0 2  Suicidal thoughts 0 0 0  PHQ-9 Score '16 4 23  ' Difficult doing work/chores Very difficult Not difficult at all -   GAD 7 : Generalized Anxiety Score 04/24/2019 02/12/2019 12/31/2018 11/29/2018  Nervous,  Anxious, on Edge '3 3 3 3  ' Control/stop worrying 0 0 2 3  Worry too much - different things 0 0 2 3  Trouble relaxing 2 0 2 3  Restless '2 1 1 2  ' Easily annoyed or irritable 0 0 2 0  Afraid - awful might happen '3 3 3 3  ' Total GAD 7 Score '10 7 15 17  ' Anxiety Difficulty Somewhat difficult Not difficult at all - -    ASSESSMENT AND PLAN: ADRIAN DINOVO is a 63 y.o.  male present for  Depression/anxiety/insomnia: -Has a history of a rather significant depression with anxiety with paranoid features.  -  Improved greatly with start of paxil.>>  Patiently recently discontinued on his own and feels he is doing okay. -Continue  trazodone 50 mg nightly. -Continue Valium 5 mg TID as needed.  Agreed to continue medication for him.  -  [place] controlled substance database reviewed with needing refills.  Currently no refills needed.  Patient aware he needs a face-to-face appointment in order for any refills to be processed -Contract will be signed next office visit for benzodiazepine use (in person). -Offered referral to counseling or psychiatry; he declined.  Diabetes, uncontrolled/diabetic peripheral neuropathy/mild nonproliferative diabetic retinopathy of both eyes/overweight:  Patient reports he has been a diabetic since 1998.   - Continue Janumet [phone] daily - Continue glipizide 10 mg BID.   -Continue for farixga 5 mg daily PNA series: Pneumovax 23 03/28/2014--> repeat after August 2020 when he will be 63 Flu shot:  Up to date 11/29/2018 (recommneded yearly) Foot exam: Completed 11/29/2018-referral to podiatry Eye exam:  Completed 01/08/2019-[facility] A1c: 09/07/2018 A1c 10.4>>9.4>>>9.7>>>11.8>>>8.5 (only 1 mo w/ diet changed and Farixga) - f/u 3 mos. All meds will need to be called into mail in pharmacy Aug.1 for new insurance  Hypertension/CAD/S/p Stent/prior MI/hypercholesteremia: Pt has a significant cardiac history of "anterior MI in 2004 with stents was LAD and then perhaps stents to a  branch vessel 2008 (by DR. Renaldo- cardio note 2015)" -Continue aspirin 81 mg daily. Cardiology told him he no longer needed the  Plavix 75 mg daily  -Continue metoprolol 25 mg daily QD  - Continue Lisinopril- HCTZ  40-50 mg daily (reports he is now taking 2 pills) -Continue Crestor 20 mg daily - labs UTD Diet: Low-sodium Exercise: Routine exercise daily - now established with cardiology as well for routine follow up.   Lumbago: - advised pt we can not cover his back ain as well today.We can offer a steroid shot only- which of course is not ideal with DM. He can not tolerate oral steroids or any muscle relaxer.  - Im depo medrol injection provided today - if no improvement must follow up for this acute issue.   F/u 3-4 months [facility]  > 25 minutes spent with patient, >50% of time spent face to face     Howard Pouch, DO 06/17/2019

## 2019-06-17 ENCOUNTER — Encounter: Payer: Self-pay | Admitting: Family Medicine

## 2019-06-20 ENCOUNTER — Other Ambulatory Visit: Payer: Self-pay | Admitting: Family Medicine

## 2019-06-20 DIAGNOSIS — F411 Generalized anxiety disorder: Secondary | ICD-10-CM

## 2019-06-20 NOTE — Telephone Encounter (Signed)
Patient states his Diazepam filled for 07/03/19 dapagliflozin propanediol (FARXIGA) 07/11/19 sent to [facility] mail order pharmacy.

## 2019-06-21 NOTE — Telephone Encounter (Signed)
To clarify on prior note, patient states when he contacted [facility] they told him they would not fill any of his medications until they got an Rx from [place]. I only noted his next 2 medications to be filled. Patient felt that [facility] will not work with him unless we have contacted them first.

## 2019-06-21 NOTE — Telephone Encounter (Signed)
Spoke with patient and he stated just needs refills on  Diazepam and Dapaglifozin. His insurance will change and he will start using [employer] on August 1st.

## 2019-06-24 NOTE — Telephone Encounter (Signed)
RF request for Diazepam and Farxiga  LOV: 06/13/2019 Next ov: 09/18/2019 Last written: Diazepam 04/24/2019 #90 x5 refills  Farxiga 05/13/2019 #30 x5 refills   Pt would like these scripts cancelled at local pharmacy and sent to [facility] mail order pharmacy- Please advise

## 2019-06-24 NOTE — Addendum Note (Signed)
Addended by: Howard Pouch A on: 06/24/2019 05:20 PM   Modules accepted: Orders

## 2019-06-24 NOTE — Addendum Note (Signed)
Addended by: Caroll Rancher L on: 06/24/2019 09:28 AM   Modules accepted: Orders

## 2019-06-24 NOTE — Telephone Encounter (Signed)
This request not appropriate. Please explain to Mr. Netzel his new insurance does not start until August 1. I can not call in any medications to the new mail in pharmacy until he gets the coverage on Aug.1--it would be denied. They will not fill until coverage starts. He must get these last scripts at his local pharmacy (where they were called in) and we will send to mail in on August 1, they will then process and send out to him taking usually a week or two on their end.   Please explain this to him.

## 2019-06-25 NOTE — Telephone Encounter (Signed)
Spoke with patient regarding prescriptions. Patient states he is aware [name] will not cover until August 1st, and they will contact current pharmacy for prescription transfer. Patient states 2 medications requested are needed until August 6th, no needs at this time.

## 2019-06-28 ENCOUNTER — Telehealth: Payer: Self-pay | Admitting: Family Medicine

## 2019-06-28 DIAGNOSIS — E7800 Pure hypercholesterolemia, unspecified: Secondary | ICD-10-CM

## 2019-06-28 DIAGNOSIS — I10 Essential (primary) hypertension: Secondary | ICD-10-CM

## 2019-06-28 DIAGNOSIS — IMO0001 Reserved for inherently not codable concepts without codable children: Secondary | ICD-10-CM

## 2019-06-28 MED ORDER — DAPAGLIFLOZIN PROPANEDIOL 5 MG PO TABS: 5.0000 mg | ORAL_TABLET | Freq: Every day | ORAL | 1 refills | Status: DC

## 2019-06-28 MED ORDER — SITAGLIP PHOS-METFORMIN HCL ER 100-1000 MG PO TB24: 1.0000 | ORAL_TABLET | Freq: Every day | ORAL | 1 refills | Status: DC

## 2019-06-28 MED ORDER — GLIPIZIDE 10 MG PO TABS: ORAL_TABLET | ORAL | 1 refills | Status: DC

## 2019-06-28 MED ORDER — METOPROLOL SUCCINATE ER 25 MG PO TB24: 25.0000 mg | ORAL_TABLET | Freq: Every day | ORAL | 1 refills | Status: DC

## 2019-06-28 MED ORDER — TRAZODONE HCL 50 MG PO TABS: 50.0000 mg | ORAL_TABLET | Freq: Every evening | ORAL | 1 refills | Status: DC | PRN

## 2019-06-28 MED ORDER — LISINOPRIL-HYDROCHLOROTHIAZIDE 20-25 MG PO TABS: 2.0000 | ORAL_TABLET | Freq: Every day | ORAL | 1 refills | Status: DC

## 2019-06-28 MED ORDER — ROSUVASTATIN CALCIUM 20 MG PO TABS: 20.0000 mg | ORAL_TABLET | Freq: Every day | ORAL | 1 refills | Status: DC

## 2019-06-28 NOTE — Telephone Encounter (Signed)
Please inform patient the following information: I had called all of his medication refills and to his new mail-in pharmacy Friday evening, with the exception of his valium. Since he still has an active script for that and it is good until November,  we can transfer it over at that time.     Wells Guiles, -Please call in the new glucose meter kit and supplies that we discussed needed called into his mail-in pharmacy.

## 2019-07-01 MED ORDER — BLOOD GLUCOSE MONITOR KIT: PACK | 11 refills | Status: DC

## 2019-07-01 NOTE — Telephone Encounter (Signed)
Glucometer/supplies RX was sent to mail order pharmacy. Pt was called and given information, he verbalized understanding

## 2019-07-01 NOTE — Addendum Note (Signed)
Addended by: Caroll Rancher L on: 07/01/2019 02:14 PM   Modules accepted: Orders

## 2019-07-03 ENCOUNTER — Telehealth: Payer: Self-pay

## 2019-07-03 NOTE — Telephone Encounter (Signed)
Mail order pharmacy was contacted and all RX's, inlcuding valium, were mailed out yesterday to patient. Pt was called and given information.

## 2019-07-03 NOTE — Telephone Encounter (Signed)
Pt called and stated that he had enough Valium to last him through tomorrow but needed another refill. His local pharmacy transferred his RX to the mail order pharmacy with the remaining refills but the mail order pharmacy will be unable to get the medication out to patient until 07/21/2019. Local pharmacy was contacted by me and they stated there are no refills and all meds, including valium, were transferred when the mail order pharmacy/patient contacted them and asked for all meds to be transferred. Please advise if you want me to cancel Valium RX at mail order and send to local instead, short supply sent to local, etc.   Pt complained of ongoing back pain and he did schedule appt for tomorrow morning.

## 2019-07-04 ENCOUNTER — Ambulatory Visit (INDEPENDENT_AMBULATORY_CARE_PROVIDER_SITE_OTHER): Payer: Medicare HMO | Admitting: Family Medicine

## 2019-07-04 ENCOUNTER — Encounter: Payer: Self-pay | Admitting: Family Medicine

## 2019-07-04 ENCOUNTER — Other Ambulatory Visit: Payer: Self-pay

## 2019-07-04 VITALS — BP 106/64 | HR 53 | Temp 97.7°F | Resp 18 | Ht 71.0 in | Wt 201.5 lb

## 2019-07-04 DIAGNOSIS — M533 Sacrococcygeal disorders, not elsewhere classified: Secondary | ICD-10-CM

## 2019-07-04 DIAGNOSIS — M5136 Other intervertebral disc degeneration, lumbar region: Secondary | ICD-10-CM

## 2019-07-04 DIAGNOSIS — M47817 Spondylosis without myelopathy or radiculopathy, lumbosacral region: Secondary | ICD-10-CM | POA: Insufficient documentation

## 2019-07-04 DIAGNOSIS — M48061 Spinal stenosis, lumbar region without neurogenic claudication: Secondary | ICD-10-CM | POA: Diagnosis not present

## 2019-07-04 DIAGNOSIS — M5126 Other intervertebral disc displacement, lumbar region: Secondary | ICD-10-CM

## 2019-07-04 MED ORDER — DIAZEPAM 5 MG PO TABS: 5.0000 mg | ORAL_TABLET | Freq: Two times a day (BID) | ORAL | 0 refills | Status: DC | PRN

## 2019-07-04 NOTE — Patient Instructions (Signed)
Tylenol for pain.  Try to take one extra strength tylenol a day.  I will refer you to ortho to discuss injections.   If you want to start physical therapy just let us know and I will refer you.   Hold positions for about 8 seconds.    Sciatica Rehab Ask your health care provider which exercises are safe for you. Do exercises exactly as told by your health care provider and adjust them as directed. It is normal to feel mild stretching, pulling, tightness, or discomfort as you do these exercises. Stop right away if you feel sudden pain or your pain gets worse. Do not begin these exercises until told by your health care provider. Stretching and range-of-motion exercises These exercises warm up your muscles and joints and improve the movement and flexibility of your hips and back. These exercises also help to relieve pain, numbness, and tingling. Sciatic nerve glide 1. Sit in a chair with your head facing down toward your chest. Place your hands behind your back. Let your shoulders slump forward. 2. Slowly straighten one of your legs while you tilt your head back as if you are looking toward the ceiling. Only straighten your leg as far as you can without making your symptoms worse. 3. Hold this position for __________ seconds. 4. Slowly return to the starting position. 5. Repeat with your other leg. Repeat __________ times. Complete this exercise __________ times a day. Knee to chest with hip adduction and internal rotation  1. Lie on your back on a firm surface with both legs straight. 2. Bend one of your knees and move it up toward your chest until you feel a gentle stretch in your lower back and buttock. Then, move your knee toward the shoulder that is on the opposite side from your leg. This is hip adduction and internal rotation. ? Hold your leg in this position by holding on to the front of your knee. 3. Hold this position for __________ seconds. 4. Slowly return to the starting  position. 5. Repeat with your other leg. Repeat __________ times. Complete this exercise __________ times a day. Prone extension on elbows  1. Lie on your abdomen on a firm surface. A bed may be too soft for this exercise. 2. Prop yourself up on your elbows. 3. Use your arms to help lift your chest up until you feel a gentle stretch in your abdomen and your lower back. ? This will place some of your body weight on your elbows. If this is uncomfortable, try stacking pillows under your chest. ? Your hips should stay down, against the surface that you are lying on. Keep your hip and back muscles relaxed. 4. Hold this position for __________ seconds. 5. Slowly relax your upper body and return to the starting position. Repeat __________ times. Complete this exercise __________ times a day. Strengthening exercises These exercises build strength and endurance in your back. Endurance is the ability to use your muscles for a long time, even after they get tired. Pelvic tilt This exercise strengthens the muscles that lie deep in the abdomen. 1. Lie on your back on a firm surface. Bend your knees and keep your feet flat on the floor. 2. Tense your abdominal muscles. Tip your pelvis up toward the ceiling and flatten your lower back into the floor. ? To help with this exercise, you may place a small towel under your lower back and try to push your back into the towel. 3. Hold this position for  __________ seconds. 4. Let your muscles relax completely before you repeat this exercise. Repeat __________ times. Complete this exercise __________ times a day. Alternating arm and leg raises  1. Get on your hands and knees on a firm surface. If you are on a hard floor, you may want to use padding, such as an exercise mat, to cushion your knees. 2. Line up your arms and legs. Your hands should be directly below your shoulders, and your knees should be directly below your hips. 3. Lift your left leg behind you.  At the same time, raise your right arm and straighten it in front of you. ? Do not lift your leg higher than your hip. ? Do not lift your arm higher than your shoulder. ? Keep your abdominal and back muscles tight. ? Keep your hips facing the ground. ? Do not arch your back. ? Keep your balance carefully, and do not hold your breath. 4. Hold this position for __________ seconds. 5. Slowly return to the starting position. 6. Repeat with your right leg and your left arm. Repeat __________ times. Complete this exercise __________ times a day. Posture and body mechanics Good posture and healthy body mechanics can help to relieve stress in your body's tissues and joints. Body mechanics refers to the movements and positions of your body while you do your daily activities. Posture is part of body mechanics. Good posture means:  Your spine is in its natural S-curve position (neutral).  Your shoulders are pulled back slightly.  Your head is not tipped forward. Follow these guidelines to improve your posture and body mechanics in your everyday activities. Standing   When standing, keep your spine neutral and your feet about hip width apart. Keep a slight bend in your knees. Your ears, shoulders, and hips should line up.  When you do a task in which you stand in one place for a long time, place one foot up on a stable object that is 2-4 inches (5-10 cm) high, such as a footstool. This helps keep your spine neutral. Sitting   When sitting, keep your spine neutral and keep your feet flat on the floor. Use a footrest, if necessary, and keep your thighs parallel to the floor. Avoid rounding your shoulders, and avoid tilting your head forward.  When working at a desk or a computer, keep your desk at a height where your hands are slightly lower than your elbows. Slide your chair under your desk so you are close enough to maintain good posture.  When working at a computer, place your monitor at a  height where you are looking straight ahead and you do not have to tilt your head forward or downward to look at the screen. Resting  When lying down and resting, avoid positions that are most painful for you.  If you have pain with activities such as sitting, bending, stooping, or squatting, lie in a position in which your body does not bend very much. For example, avoid curling up on your side with your arms and knees near your chest (fetal position).  If you have pain with activities such as standing for a long time or reaching with your arms, lie with your spine in a neutral position and bend your knees slightly. Try the following positions: ? Lying on your side with a pillow between your knees. ? Lying on your back with a pillow under your knees. Lifting   When lifting objects, keep your feet at least shoulder width apart  and tighten your abdominal muscles.  Bend your knees and hips and keep your spine neutral. It is important to lift using the strength of your legs, not your back. Do not lock your knees straight out.  Always ask for help to lift heavy or awkward objects. This information is not intended to replace advice given to you by your health care provider. Make sure you discuss any questions you have with your health care provider. Document Released: 11/14/2005 Document Revised: 03/08/2019 Document Reviewed: 12/06/2018 Elsevier Patient Education  2020 Elsevier Inc.   Sacroiliac Joint Dysfunction  Sacroiliac joint dysfunction is a condition that causes inflammation on one or both sides of the sacroiliac (SI) joint. The SI joint connects the lower part of the spine (sacrum) with the two upper portions of the pelvis (ilium). This condition causes deep aching or burning pain in the low back. In some cases, the pain may also spread into one or both buttocks, hips, or thighs. What are the causes? This condition may be caused by:  Pregnancy. During pregnancy, extra stress is put  on the SI joints because the pelvis widens.  Injury, such as: ? Injuries from car accidents. ? Sports-related injuries. ? Work-related injuries.  Having one leg that is shorter than the other.  Conditions that affect the joints, such as: ? Rheumatoid arthritis. ? Gout. ? Psoriatic arthritis. ? Joint infection (septic arthritis). Sometimes, the cause of SI joint dysfunction is not known. What are the signs or symptoms? Symptoms of this condition include:  Aching or burning pain in the lower back. The pain may also spread to other areas, such as: ? Buttocks. ? Groin. ? Thighs.  Muscle spasms in or around the painful areas.  Increased pain when standing, walking, running, stair climbing, bending, or lifting. How is this diagnosed? This condition is diagnosed with a physical exam and medical history. During the exam, the health care provider may move one or both of your legs to different positions to check for pain. Various tests may be done to confirm the diagnosis, including:  Imaging tests to look for other causes of pain. These may include: ? MRI. ? CT scan. ? Bone scan.  Diagnostic injection. A numbing medicine is injected into the SI joint using a needle. If your pain is temporarily improved or stopped after the injection, this can indicate that SI joint dysfunction is the problem. How is this treated? Treatment depends on the cause and severity of your condition. Treatment options may include:  Ice or heat applied to the lower back area after an injury. This may help reduce pain and muscle spasms.  Medicines to relieve pain or inflammation or to relax the muscles.  Wearing a back brace (sacroiliac brace) to help support the joint while your back is healing.  Physical therapy to increase muscle strength around the joint and flexibility at the joint. This may also involve learning proper body positions and ways of moving to relieve stress on the joint.  Direct  manipulation of the SI joint.  Injections of steroid medicine into the joint to reduce pain and swelling.  Radiofrequency ablation to burn away nerves that are carrying pain messages from the joint.  Use of a device that provides electrical stimulation to help reduce pain at the joint.  Surgery to put in screws and plates that limit or prevent joint motion. This is rare. Follow these instructions at home: Medicines  Take over-the-counter and prescription medicines only as told by your health care provider.  Do not drive or use heavy machinery while taking prescription pain medicine.  If you are taking prescription pain medicine, take actions to prevent or treat constipation. Your health care provider may recommend that you: ? Drink enough fluid to keep your urine pale yellow. ? Eat foods that are high in fiber, such as fresh fruits and vegetables, whole grains, and beans. ? Limit foods that are high in fat and processed sugars, such as fried or sweet foods. ? Take an over-the-counter or prescription medicine for constipation. If you have a brace:  Wear the brace as told by your health care provider. Remove it only as told by your health care provider.  Keep the brace clean.  If the brace is not waterproof: ? Do not let it get wet. ? Cover it with a watertight covering when you take a bath or a shower. Managing pain, stiffness, and swelling      Icing can help with pain and swelling. Heat may help with muscle tension or spasms. Ask your health care provider if you should use ice or heat.  If directed, put ice on the affected area: ? If you have a removable brace, remove it as told by your health care provider. ? Put ice in a plastic bag. ? Place a towel between your skin and the bag. ? Leave the ice on for 20 minutes, 2-3 times a day.  If directed, apply heat to the affected area. Use the heat source that your health care provider recommends, such as a moist heat pack or a  heating pad. ? Place a towel between your skin and the heat source. ? Leave the heat on for 20-30 minutes. ? Remove the heat if your skin turns bright red. This is especially important if you are unable to feel pain, heat, or cold. You may have a greater risk of getting burned. General instructions  Rest as needed. Ask your health care provider what activities are safe for you.  Return to your normal activities as told by your health care provider.  Exercise as directed by your health care provider or physical therapist.  Do not use any products that contain nicotine or tobacco, such as cigarettes and e-cigarettes. These can delay bone healing. If you need help quitting, ask your health care provider.  Keep all follow-up visits as told by your health care provider. This is important. Contact a health care provider if:  Your pain is not controlled with medicine.  You have a fever.  Your pain is getting worse. Get help right away if:  You have weakness, numbness, or tingling in your legs or feet.  You lose control of your bladder or bowel. Summary  Sacroiliac joint dysfunction is a condition that causes inflammation on one or both sides of the sacroiliac (SI) joint.  This condition causes deep aching or burning pain in the low back. In some cases, the pain may also spread into one or both buttocks, hips, or thighs.  Treatment depends on the cause and severity of your condition. It may include medicines to reduce pain and swelling or to relax muscles. This information is not intended to replace advice given to you by your health care provider. Make sure you discuss any questions you have with your health care provider. Document Released: 02/10/2009 Document Revised: 07/11/2018 Document Reviewed: 12/25/2017 Elsevier Patient Education  2020 [employer]

## 2019-07-04 NOTE — Progress Notes (Signed)
[name] , [birth year], 63 y.o., male MRN: [id number] Patient Care Team    Relationship Specialty Notifications Start End  Ma Hillock, DO PCP - General Family Medicine  11/29/18   Bernarda Caffey, MD Consulting Physician Ophthalmology  01/09/19     Chief Complaint  Patient presents with  . Back Pain    lower back pain continues. Pt is walking 25 miles weekly      Subjective: Pt presents for an OV with complaints of lower back pain that sometimes radiates around his left upper hip.he was seen 3 weeks ago for similar condition and provided a steroid shot in which he states he had complete resolution of his symptoms for about a week.  His symptoms then returned.  He is rather active and walks a great deal a day.  He states the pain is worse laying flat and when he gets up first thing in the morning.  He states it does improve at least some throughout the day usually.  However he thinks he again flared up his back over the last few days.  He has a history of L3-L4, L4-L5, L5-S1 bulging disc and facet hypertrophy with right and left foraminal stenosis worse at the L4-L5 level.  He had been seen by neurology in 2017 with an MRI with similar symptoms.  At that time he was tried on tramadol, gabapentin and muscle relaxers with side effects to medications.  They made him very paranoid and he could not tolerate them, he has a history of severe anxiety with paranoia.Marland Kitchen  He also cannot tolerate oral steroids secondary to paranoia.  He has been able to tolerate injected steroid and had a steroid injection 06/13/2019 when he originally presented to this provider with symptoms.  He did get complete resolution of his lower back pain for about a week and then his pain returned.  NSAIDs are not ideal with his history of coronary artery disease with history of MI and CABG-status post stent placement.  MRI 2017: IMPRESSION:  Abnormal MRI lumbar spine (without) demonstrating: 1. At L3-4: disc bulging and  facet hypertrophy with mild right and moderate left foraminal stenosis; small synovial cyst in the left postero-lateral region 2. At L4-5: disc bulging and facet hypertrophy with severe biforaminal stenosis  3. At L5-S1: disc bulging and facet hypertrophy with moderate right and mild left foraminal stenosis  Depression screen [facility] 2/9 06/04/2019 02/12/2019 11/29/2018  Decreased Interest 3 0 3  Down, Depressed, Hopeless _0 PHQ - 2 Score _1 Altered sleeping 3 0 3  Tired, decreased energy 3 0 3  Change in appetite 3 0 3  Feeling bad or failure about yourself  _2 Trouble concentrating 0 0 3  Moving slowly or fidgety/restless 0 0 2  Suicidal thoughts 0 0 0  PHQ-9 Score _3 Difficult doing work/chores Very difficult Not difficult at all -    Allergies  Allergen Reactions  . Hydrocodone Other (See Comments)    PT reports  Confusion when tacking Hydrocodone  . Nsaids     Aspirin/Plavix therapy  . 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 Alcohol addiction ([place])   . Anxiety   . Arthritis   . Bulging of lumbar intervertebral disc 2017   MRI: L3-4, L4-5 and L5-S1- facet hypertrophy and formainal stenosis  . CAD (coronary artery disease)   . Coronary artery disease with hx of myocardial infarct w/o hx of CABG 2004  . Depression   . Dextrocardia   . Diabetes ([place])   . Heart attack [facility]) 2004   Stent placement 2008  . Hyperlipidemia   . Hypertension   . Insomnia    Past Surgical History:  Procedure Laterality Date  . CORONARY ANGIOPLASTY WITH STENT PLACEMENT  2008   Family History  Problem Relation Age of Onset  . Heart attack Mother   . Heart disease Mother   . Diabetes  Mother   . Arthritis Mother   . Hyperlipidemia Mother   . Hypertension Mother   . Breast cancer Mother   . Heart attack Father   . Heart disease Father   . Alcohol abuse Father   . Early death Father   . Arthritis Sister   . Heart disease Sister   . Hyperlipidemia Sister   . Asthma Sister   . COPD Sister   . Diabetes Sister   . Alcohol abuse Sister    Allergies as of 07/04/2019      Reactions   Hydrocodone Other (See Comments)   PT reports  Confusion when tacking Hydrocodone   Nsaids    Aspirin/Plavix therapy   Penicillins Other (See Comments)   Childhood unsure if still allgeric      Medication List       Accurate as of July 04, 2019  2:23 PM. If you have any questions, ask your nurse or doctor.        Accu-Chek FastClix Lancets Misc   Accu-Chek Guide test strip Generic drug: glucose blood Use to check sugars up to three times daily DX11.9   aspirin EC 81 MG tablet Take 1 tablet (81 mg total) by mouth daily.   blood glucose meter kit and supplies check blood sugar in the morning fasting and random check in the afternoon   blood glucose meter kit and supplies Kit Dispense based on patient and insurance preference. Use up to four times daily as directed. DX E11.9   dapagliflozin propanediol 5 MG Tabs tablet Commonly known as: FARXIGA Take 5 mg by mouth daily.   diazepam 5 MG tablet Commonly known as: VALIUM Take 1 tablet (5 mg total) by mouth every 8 (eight) hours as needed for anxiety. What changed: Another medication with the same name was added. Make sure you understand how and when to take each. Changed by: Howard Pouch, DO   diazepam 5 MG tablet Commonly known as: VALIUM Take 1 tablet (5 mg total) by mouth every 12 (twelve) hours as needed for anxiety. What changed: You were already taking a medication with the same name, and this prescription was added. Make sure you understand how and when to take each. Changed by: Howard Pouch, DO   glipiZIDE 10  MG tablet Commonly known as: GLUCOTROL TAKE  (1)  TABLET TWICE A DAY.   lisinopril-hydrochlorothiazide 20-25 MG tablet Commonly known as: ZESTORETIC Take 2 tablets by mouth daily.   metoprolol succinate 25 MG 24 hr tablet Commonly known as: TOPROL-XL Take 1 tablet (25 mg total) by mouth daily.   Omega 3 1000 MG Caps Take 1 capsule (1,000 mg total) by mouth daily.  rosuvastatin 20 MG tablet Commonly known as: CRESTOR Take 1 tablet (20 mg total) by mouth daily.   SitaGLIPtin-MetFORMIN HCl [phone] MG Tb24 Take 1 tablet by mouth daily.   traZODone 50 MG tablet Commonly known as: DESYREL Take 1 tablet (50 mg total) by mouth at bedtime as needed for sleep.       All past medical history, surgical history, allergies, family history, immunizations andmedications were updated in the EMR today and reviewed under the history and medication portions of their EMR.     ROS: Negative, with the exception of above mentioned in HPI   Objective:  BP 106/64 (BP Location: Right Arm, Patient Position: Sitting, Cuff Size: Normal)   Pulse (!) 53   Temp 97.7 F (36.5 C) (Temporal)   Resp 18   Ht _0  (1.803 m)   Wt 201 lb 8 oz (91.4 kg)   SpO2 98%   BMI 28.10 kg/m  Body mass index is 28.1 kg/m. Gen: Afebrile. No acute distress. Nontoxic in appearance, well developed, well nourished.  HENT: AT. [place].MMM MSK: No erythema, no soft tissue swelling.  No bony tenderness of his lumbar spine.  Tender at his left SI joint.  Full range of motion with discomfort on left side bending and right rotation.  Flexion and extension without discomfort.  Straight leg raise positive left. FABRE positive left for SI pain.  Muscle strength 5/5 bilateral with the exception of 4+/5 on left hip flexion and 4/5 left toe left.  DTRs moderately diminished bilateral lower extremity. Skin: no rashes, purpura or petechiae.  Neuro: Normal gait. PERLA. EOMi. Alert. Oriented x3  Psych: Normal affect, dress and demeanor.  Normal speech. Normal thought content and judgment.  No exam data present No results found. No results found for this or any previous visit (from the past 24 hour(s)).  Assessment/Plan: KYMANI SHIMABUKURO is a 63 y.o. male present for OV for  Bulging of lumbar intervertebral disc/Facet hypertrophy of lumbosacral region/Foraminal stenosis of lumbar region/SI joint dysfuntion Lengthy discussion with patient today on his back pain.  He certainly has proven lumbar bulging disks in facet  hypertrophy and stenosis by MRI in 2017.  His exam is consistent with those findings today and additional of left SI pain is the main location of his discomfort today. Unfortunately, with his side effects to gabapentin, tramadol, muscle relaxer, hydrocodone and oral steroids-as well as contraindications to NSAIDs with his cardiac conditions, are options are very limited for treatment. -Discussed physical therapy with him today.  Physical therapy and/or OMT can be beneficial.  Unfortunately he is without a license currently and this would be very difficult for him to make routine appointments multiple times a week.  He was provided with stretches and instructions for lower lumbar/SI/sciatic to perform at home. -Discussed orthopedic referral to consider possible injections whether steroid injection and SI or lumbar injections/epidural injections.  He would like to pursue this option today. -In the meantime he will continue to take an extra strength Tylenol daily.   -Of note: [place] controlled substance database was reviewed today.  Patient recently changed insurances and his Valium was transferred to the [facility].  The mail-in pharmacy did mail his prescription 07/02/2019.  However secondary to the delay in the prescription he has run out of medicine until the prescription arrives.  Provided him with short course prescription to hold him over until his mail-in pharmacy prescription arrives.   Reviewed  expectations re: course of current medical issues.  Discussed  self-management of symptoms.  Outlined signs and symptoms indicating need for more acute intervention.  Patient verbalized understanding and all questions were answered.  Patient received an After-Visit Summary.    No orders of the defined types were placed in this encounter.   > 25 minutes spent with patient, >50% of time spent face to face    Note is dictated utilizing voice recognition software. Although note has been proof read prior to signing, occasional typographical errors still can be missed. If any questions arise, please do not hesitate to call for verification.   electronically signed by:  Howard Pouch, DO  [place]

## 2019-07-10 ENCOUNTER — Other Ambulatory Visit: Payer: Self-pay | Admitting: Family Medicine

## 2019-08-12 ENCOUNTER — Other Ambulatory Visit: Payer: Self-pay

## 2019-08-12 ENCOUNTER — Encounter: Payer: Self-pay | Admitting: Family Medicine

## 2019-08-12 ENCOUNTER — Ambulatory Visit (INDEPENDENT_AMBULATORY_CARE_PROVIDER_SITE_OTHER): Payer: Medicare HMO | Admitting: Family Medicine

## 2019-08-12 VITALS — Ht 71.0 in

## 2019-08-12 DIAGNOSIS — R51 Headache: Secondary | ICD-10-CM | POA: Diagnosis not present

## 2019-08-12 DIAGNOSIS — R519 Headache, unspecified: Secondary | ICD-10-CM

## 2019-08-12 NOTE — Progress Notes (Signed)
VIRTUAL VISIT VIA- telephone  I connected with [name] on 08/12/19 at  4:00 PM EDT by a telephone  and verified that I am speaking with the correct person using two identifiers. Location patient: Home Location provider: [facility], Office Persons participating in the virtual visit: Patient, Dr. Raoul Pitch and R.Baker, LPN  I discussed the limitations of evaluation and management by telemedicine and the availability of in person appointments. The patient expressed understanding and agreed to proceed.  Patient does not have access to smart phone or computer   SUBJECTIVE Chief Complaint  Patient presents with  . Headache    Headache for two weeks that shifts to different sides of head. Pt was taking Tylenol sinus but was making his sugars go up. Denies fever, cough, and nasal congestion.     HPI: [name] is a 63 y.o. male presents via virtual visit to discuss 2 weeks onset of headaches. He was taking a sinus medicine but he felt it was causing elevated sugar so he stopped using. He denies fever, chills, cough, nasal congestion or sinus pressure. He state it started the day he was working out on his tractor. The tylenol sinus was somewhat helpful. He reports the symptoms were in the back of his head and neck. He denies any visual changes, gait abnormality or unilateral weakness of extremity. He is feeling better today and it was the first day he has been able to get back out and exercise.  He is unable to tolerate any muscle relaxers.   ROS: See pertinent positives and negatives per HPI.  Patient Active Problem List   Diagnosis Date Noted  . Facet hypertrophy of lumbosacral region 07/04/2019  . Foraminal stenosis of lumbar region 07/04/2019  . Cyst of eye 04/11/2019  . Mild nonproliferative diabetic retinopathy ([place]) 01/09/2019  . Overweight (BMI 25.0-29.9) 11/29/2018  . Generalized anxiety disorder 09/02/2017  . Vasculogenic erectile dysfunction 09/02/2017  .  Murmur 12/28/2015  . Insomnia 12/28/2015  . Diabetic peripheral neuropathy ([place]) 12/21/2015  . Bulging of lumbar intervertebral disc 2017  . Uncontrolled type 2 diabetes mellitus without complication, without long-term current use of insulin ([place]) 07/07/2014  . S/P coronary artery stent placement 03/31/2014  . Coronary artery disease with history of myocardial infarction without history of CABG 03/31/2014  . Essential hypertension 03/28/2014  . HYPERCHOLESTEROLEMIA 11/08/2010    Social History   Tobacco Use  . Smoking status: Never Smoker  . Smokeless tobacco: Never Used  Substance Use Topics  . Alcohol use: Yes    Alcohol/week: 0.0 standard drinks    Comment: Occasional    Current Outpatient Medications:  .  Accu-Chek FastClix Lancets MISC, , Disp: , Rfl:  .  aspirin EC 81 MG tablet, Take 1 tablet (81 mg total) by mouth daily., Disp: 90 tablet, Rfl: 3 .  blood glucose meter kit and supplies KIT, Dispense based on patient and insurance preference. Use up to four times daily as directed. DX E11.9, Disp: 1 each, Rfl: 11 .  blood glucose meter kit and supplies, check blood sugar in the morning fasting and random check in the afternoon, Disp: 1 each, Rfl: 0 .  dapagliflozin propanediol (FARXIGA) 5 MG TABS tablet, Take 5 mg by mouth daily., Disp: 90 tablet, Rfl: 1 .  diazepam (VALIUM) 5 MG tablet, Take 1 tablet (5 mg total) by mouth every 8 (eight) hours as needed for anxiety., Disp: 90 tablet, Rfl: 5 .  glipiZIDE (GLUCOTROL) 10 MG tablet, TAKE  (  1)  TABLET TWICE A DAY., Disp: 180 tablet, Rfl: 1 .  glucose blood (ACCU-CHEK GUIDE) test strip, Use to check sugars up to three times daily DX11.9, Disp: 100 each, Rfl: 12 .  lisinopril-hydrochlorothiazide (ZESTORETIC) 20-25 MG tablet, Take 2 tablets by mouth daily., Disp: 180 tablet, Rfl: 1 .  metoprolol succinate (TOPROL-XL) 25 MG 24 hr tablet, Take 1 tablet (25 mg total) by mouth daily., Disp: 90 tablet, Rfl: 1 .  Omega 3 1000 MG CAPS, Take 1  capsule (1,000 mg total) by mouth daily., Disp: 30 each, Rfl: 11 .  rosuvastatin (CRESTOR) 20 MG tablet, Take 1 tablet (20 mg total) by mouth daily., Disp: 90 tablet, Rfl: 1 .  SitaGLIPtin-MetFORMIN HCl [phone] MG TB24, Take 1 tablet by mouth daily., Disp: 90 tablet, Rfl: 1 .  traZODone (DESYREL) 50 MG tablet, Take 1 tablet (50 mg total) by mouth at bedtime as needed for sleep., Disp: 90 tablet, Rfl: 1  Allergies  Allergen Reactions  . Gabapentin Other (See Comments)    Paranoia/hallucinations  . Hydrocodone Other (See Comments)    PT reports  Confusion when tacking Hydrocodone  . Nsaids     History of heart disease, CAD with history of MI, CABG status post stent  . Prednisone     Paranoia  . Tramadol   . Penicillins Other (See Comments)    Childhood unsure if still allgeric    OBJECTIVE: Ht _0  (1.803 m)   BMI 28.10 kg/m  Gen: No acute distress. Nontoxic in appearance.  HENT: AT. [place].  MMM.  Eyes:Pupils Equal Round Reactive to light, Extraocular movements intact,  Conjunctiva without redness, discharge or icterus. CV: no edema Chest: Cough or shortness of breath Skin: no rashes, purpura or petechiae.  Neuro: Alert. Oriented x3  Psych: Normal affect, dress and demeanor. Normal speech. Normal thought content and judgment.  ASSESSMENT AND PLAN: [name] is a 63 y.o. male present for  Acute nonintractable headache, unspecified headache type Uncertain etiology of his headaches although they sound like tension headaches.  Encouraged him to use Tylenol for discomfort.  Apply heat to his neck.  Symptoms seem to be worse on days that he is riding a tractor, encouraged him to take Tylenol before using his tractor.  He is unfortunately not able to take any muscle relaxers secondary to side effects.  > 15 minutes spent with patient   Howard Pouch, DO 08/12/2019

## 2019-08-13 ENCOUNTER — Ambulatory Visit: Payer: PRIVATE HEALTH INSURANCE | Admitting: Podiatry

## 2019-08-14 ENCOUNTER — Encounter: Payer: Self-pay | Attending: Family Medicine | Admitting: Nutrition

## 2019-08-14 ENCOUNTER — Telehealth: Payer: Self-pay | Admitting: Nutrition

## 2019-08-14 DIAGNOSIS — I2510 Atherosclerotic heart disease of native coronary artery without angina pectoris: Secondary | ICD-10-CM | POA: Insufficient documentation

## 2019-08-14 DIAGNOSIS — I252 Old myocardial infarction: Secondary | ICD-10-CM | POA: Insufficient documentation

## 2019-08-14 DIAGNOSIS — I10 Essential (primary) hypertension: Secondary | ICD-10-CM | POA: Insufficient documentation

## 2019-08-14 DIAGNOSIS — E1142 Type 2 diabetes mellitus with diabetic polyneuropathy: Secondary | ICD-10-CM | POA: Insufficient documentation

## 2019-08-14 NOTE — Telephone Encounter (Signed)
VM left to call to complete followup phone visit on home and cell phone.

## 2019-08-15 ENCOUNTER — Encounter: Payer: Self-pay | Admitting: Family Medicine

## 2019-09-18 ENCOUNTER — Encounter: Payer: Self-pay | Admitting: Family Medicine

## 2019-09-18 ENCOUNTER — Other Ambulatory Visit: Payer: Self-pay

## 2019-09-18 ENCOUNTER — Ambulatory Visit (INDEPENDENT_AMBULATORY_CARE_PROVIDER_SITE_OTHER): Payer: Medicare HMO | Admitting: Family Medicine

## 2019-09-18 VITALS — BP 84/52 | HR 61 | Temp 98.2°F | Resp 18 | Ht 71.0 in | Wt 199.5 lb

## 2019-09-18 DIAGNOSIS — E7800 Pure hypercholesterolemia, unspecified: Secondary | ICD-10-CM

## 2019-09-18 DIAGNOSIS — E113293 Type 2 diabetes mellitus with mild nonproliferative diabetic retinopathy without macular edema, bilateral: Secondary | ICD-10-CM | POA: Diagnosis not present

## 2019-09-18 DIAGNOSIS — E663 Overweight: Secondary | ICD-10-CM | POA: Diagnosis not present

## 2019-09-18 DIAGNOSIS — I959 Hypotension, unspecified: Secondary | ICD-10-CM

## 2019-09-18 DIAGNOSIS — G4700 Insomnia, unspecified: Secondary | ICD-10-CM

## 2019-09-18 DIAGNOSIS — F411 Generalized anxiety disorder: Secondary | ICD-10-CM | POA: Diagnosis not present

## 2019-09-18 DIAGNOSIS — Z955 Presence of coronary angioplasty implant and graft: Secondary | ICD-10-CM

## 2019-09-18 DIAGNOSIS — I10 Essential (primary) hypertension: Secondary | ICD-10-CM

## 2019-09-18 DIAGNOSIS — J029 Acute pharyngitis, unspecified: Secondary | ICD-10-CM | POA: Diagnosis not present

## 2019-09-18 DIAGNOSIS — E119 Type 2 diabetes mellitus without complications: Secondary | ICD-10-CM | POA: Diagnosis not present

## 2019-09-18 DIAGNOSIS — I252 Old myocardial infarction: Secondary | ICD-10-CM

## 2019-09-18 DIAGNOSIS — I2510 Atherosclerotic heart disease of native coronary artery without angina pectoris: Secondary | ICD-10-CM | POA: Diagnosis not present

## 2019-09-18 DIAGNOSIS — E118 Type 2 diabetes mellitus with unspecified complications: Secondary | ICD-10-CM

## 2019-09-18 DIAGNOSIS — E1142 Type 2 diabetes mellitus with diabetic polyneuropathy: Secondary | ICD-10-CM

## 2019-09-18 DIAGNOSIS — R5383 Other fatigue: Secondary | ICD-10-CM

## 2019-09-18 LAB — RESPIRATORY VIRUS PANEL
Influenza A RNA: NOT DETECTED
Influenza B RNA: NOT DETECTED
RSV RNA: NOT DETECTED
hMPV: NOT DETECTED

## 2019-09-18 LAB — CBC WITH DIFFERENTIAL/PLATELET
Basophils Absolute: 0.1 10*3/uL (ref 0.0–0.1)
Basophils Relative: 0.5 % (ref 0.0–3.0)
Eosinophils Absolute: 0.1 10*3/uL (ref 0.0–0.7)
Eosinophils Relative: 0.5 % (ref 0.0–5.0)
HCT: 44.7 % (ref 39.0–52.0)
Hemoglobin: 15 g/dL (ref 13.0–17.0)
Lymphocytes Relative: 15.6 % (ref 12.0–46.0)
Lymphs Abs: 1.6 10*3/uL (ref 0.7–4.0)
MCHC: 33.6 g/dL (ref 30.0–36.0)
MCV: 89.9 fl (ref 78.0–100.0)
Monocytes Absolute: 1.1 10*3/uL — ABNORMAL HIGH (ref 0.1–1.0)
Monocytes Relative: 11 % (ref 3.0–12.0)
Neutro Abs: 7.4 10*3/uL (ref 1.4–7.7)
Neutrophils Relative %: 72.4 % (ref 43.0–77.0)
Platelets: 225 10*3/uL (ref 150.0–400.0)
RBC: 4.97 Mil/uL (ref 4.22–5.81)
RDW: 13.9 % (ref 11.5–15.5)
WBC: 10.3 10*3/uL (ref 4.0–10.5)

## 2019-09-18 LAB — COMPREHENSIVE METABOLIC PANEL
ALT: 20 U/L (ref 0–53)
AST: 16 U/L (ref 0–37)
Albumin: 4.6 g/dL (ref 3.5–5.2)
Alkaline Phosphatase: 44 U/L (ref 39–117)
BUN: 19 mg/dL (ref 6–23)
CO2: 29 mEq/L (ref 19–32)
Calcium: 9.7 mg/dL (ref 8.4–10.5)
Chloride: 95 mEq/L — ABNORMAL LOW (ref 96–112)
Creatinine, Ser: 0.91 mg/dL (ref 0.40–1.50)
GFR: 83.58 mL/min (ref >60.00)
Glucose, Bld: 110 mg/dL — ABNORMAL HIGH (ref 70–99)
Potassium: 4.4 mEq/L (ref 3.5–5.1)
Sodium: 133 mEq/L — ABNORMAL LOW (ref 135–145)
Total Bilirubin: 0.4 mg/dL (ref 0.2–1.2)
Total Protein: 7.8 g/dL (ref 6.0–8.3)

## 2019-09-18 LAB — STREP COMPLETE PANEL
Strep Pyogenes: NEGATIVE
Strep dysgalactiae: NEGATIVE

## 2019-09-18 LAB — POCT GLYCOSYLATED HEMOGLOBIN (HGB A1C)
HbA1c POC (<> result, manual entry): 6.5 % (ref 4.0–5.6)
HbA1c, POC (controlled diabetic range): 6.5 % (ref 0.0–7.0)
HbA1c, POC (prediabetic range): 6.5 % — AB (ref 5.7–6.4)
Hemoglobin A1C: 6.5 % — AB (ref 4.0–5.6)

## 2019-09-18 MED ORDER — DAPAGLIFLOZIN PROPANEDIOL 5 MG PO TABS: 5.0000 mg | ORAL_TABLET | Freq: Every day | ORAL | 1 refills | Status: DC

## 2019-09-18 MED ORDER — SITAGLIP PHOS-METFORMIN HCL ER 100-1000 MG PO TB24: 1.0000 | ORAL_TABLET | Freq: Every day | ORAL | 1 refills | Status: DC

## 2019-09-18 MED ORDER — GLIPIZIDE 10 MG PO TABS: ORAL_TABLET | ORAL | 1 refills | Status: DC

## 2019-09-18 MED ORDER — TRAZODONE HCL 50 MG PO TABS: 50.0000 mg | ORAL_TABLET | Freq: Every evening | ORAL | 1 refills | Status: DC | PRN

## 2019-09-18 MED ORDER — LISINOPRIL-HYDROCHLOROTHIAZIDE 20-25 MG PO TABS: 2.0000 | ORAL_TABLET | Freq: Every day | ORAL | 1 refills | Status: DC

## 2019-09-18 MED ORDER — METOPROLOL SUCCINATE ER 25 MG PO TB24: 25.0000 mg | ORAL_TABLET | Freq: Every day | ORAL | 1 refills | Status: DC

## 2019-09-18 NOTE — Patient Instructions (Addendum)
Not take lisinopril/hctz pills tomorrow, the following day you can take 1 of them. We will give you more instruction when we call you.   Continue all other meds. They have been refilled.   If sugars start to decrease below 70 or dizziness- stop [place].   If get more weak, or fever, or can not tolerate food/water.

## 2019-09-18 NOTE — Progress Notes (Signed)
[name] , 12/26/1955, 63 y.o., male MRN: [id number] Patient Care Team    Relationship Specialty Notifications Start End  Daniel Hillock, DO PCP - General Family Medicine  11/29/18   Daniel Caffey, MD Consulting Physician Ophthalmology  01/09/19     Chief Complaint  Patient presents with  . Diabetes  . Hypertension     Subjective: [name] is a 63 y.o. male present today for [facility] - pt showed up to office ill.   Depression/anxiety/insomnia: Patient had been on Paxil, he was breaking it in thirds.  He states he discontinued this medication because his anxiety is much better controlled than prior.  He is in his Valium 3 times daily, as prescribed.  He is using trazodone 50 mg daily at bedtime.    Feels he is doing rather well on this combination.   Original note: Patient reports he has been treated for his depression, anxiety and insomnia with Valium 5 mg many years. He states he used to be treated with Xanax and that medication caused him to have difficulties with memory. He states he takes his Valium about 3 times a day. He used to only need the Valium twice a day. Also has been prescribed Ambien, which she states does not work at all. He reports having depression and anxiety greater than 15 to 20 years. Feels his anxiety became much more intense after he found his girlfriend greater than 20 years dead in their bed from COPD complications. He admits he had problems before she passed away, however she had always been able to calm him down. He states he has a rather routine day. He does not have much social contact in a day. He lives by himself. He reports that he always feels like he "stays on edge. "He reports when he wakes up he is already feeling on edge and he will take a Valium at that time. He then has breakfast and goes on a 4 mile walk daily. For lunch he starts to feel his anxiety sudden and he becomes panicked. He states when he feels this way he will break out  in a sweat, become very fearful and scared. He states he commonly fears there is someone in the house with a gun. He goes to his bedroom gets in his bed and because his sister. He states the fear is so intense and he just cannot control it. He denies any known trauma or event experienced in his life that would create such a fear. He states he did go see a counselor/psychiatrist at [facility] in the past6 months, and he did not feel that was helpful for him. Denies any SI or HI.  Diabetes/overweight: Patient reports he has been a diabetic since 1998.  He reports compliance with Janumet/Metformin 100-100 mg daily, glipizide 10 mg twice daily, Farxiga 5 mg daily.   He has made a great deal of dietary changes after lengthy discussion on his dietary habits.  He has since met with the dietitian and he reports she has been helpful as well.  He has continued to increase his exercise and watch his diet and lose weight.  He reports his fasting glucoses are anywhere from 70-129, but majority are in the 80s to 90s. Patient denies dizziness, hyperglycemic or hypoglycemic events. Patient denies numbness, tingling in the extremities or nonhealing wounds of feet.   PNA series:Pneumovax 23 03/28/2014-->repeat after August 2020 when he will be 63>> postpone this until next visit secondary to him  being ill. Flu shot:  Due today, postponed secondary to illness(recommneded yearly) BMP: 09/18/2019 normal GFR and creatinine.  Mild hyponatremia with a sodium of 133. Foot exam:Completed 11/29/2018-referral to podiatry Eye exam: Completed 01/08/2019-[place] A1c:10.4>>>9.4>>  9.7>>11.8>> 8.5 >>> 6.5 today !!!   Hypertension/CAD/S/p Stent/prior MI: Pt has a significant cardiac history of "anterior MI in 2004 with stents was LAD and then perhaps stents to branch vessel 2008(by DR. Renaldo-cardio note 2015)" Pt reports compliance withaspirin 81 mg, Plavix DCd by cards recently, lisinopril-HCTZ 40- 50, Crestor 20 mg mg  daily, metoprolol 25 mg daily.Patient denies chest pain, shortness of breath, dizziness or lower extremity edema.  He states he has not been checking his blood pressures much lately. BMP: 01/2019 mild elevation in AST and glucose over 200 CBC: 11/2018 within normal limits Lipids: 02/12/2019 total cholesterol 149, HDL 39, LDL 87, triglycerides 216. TSH: 11/2018 within normal limits Diet:Low-sodium Exercise:Routine exercise daily XO:VANVBTYOMAYO, hyperlipidemia, diabetes, overweight, family history,CAD, MI  Sore throat/fatigue: Patient reports the last 2-3 days he has not been feeling well.  He states he had a mild sore throat, headache and has felt very tired.  He states he stayed in bed over the weekend.  He feels mildly improved today.  He is rather hypotensive on exam today.  He states he is tolerating food and drink but has not had much of an appetite over the last 2 days.  He denies any fever, chills, nausea, vomit, cough, shortness of breath or GI symptoms.  Neck pain/back pain: Patient has had recurrent posterior neck pain and headache.  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  He states he found an old prescription of naproxen and restarted it the last time he had symptoms.  He states it worked well for him.  He is wondering if we could get refills for him today.  In the past he has stated he could not tolerate NSAIDs.    Depression screen [facility] 2/9 06/04/2019 02/12/2019 11/29/2018  Decreased Interest 3 0 3  Down, Depressed, Hopeless _0 PHQ - 2 Score _1 Altered sleeping 3 0 3  Tired, decreased energy 3 0 3  Change in appetite 3 0 3  Feeling bad or failure about yourself  _2 Trouble concentrating 0 0 3  Moving slowly or fidgety/restless 0 0 2  Suicidal thoughts 0 0 0  PHQ-9 Score _3 Difficult doing work/chores Very difficult Not difficult at all -    Allergies  Allergen Reactions  . Gabapentin Other (See Comments)     Paranoia/hallucinations  . Hydrocodone Other (See Comments)    PT reports  Confusion when tacking Hydrocodone  . Nsaids     History of heart disease, CAD with history of MI, CABG status post stent  . Prednisone     Paranoia  . Tramadol   . 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 Alcohol addiction ([place])   . Anxiety   . Arthritis   . Bulging of lumbar intervertebral disc 2017   MRI: L3-4, L4-5 and L5-S1-  facet hypertrophy and formainal stenosis  . CAD (coronary artery disease)   . Coronary artery disease with hx of myocardial infarct w/o hx of CABG 2004  . Depression   . Dextrocardia   . Diabetes ([place])   . Heart attack [facility]) 2004   Stent placement 2008  . Hyperlipidemia   . Hypertension   . Insomnia    Past Surgical History:  Procedure Laterality Date  . CORONARY ANGIOPLASTY WITH STENT PLACEMENT  2008   Family History  Problem Relation Age of Onset  . Heart attack Mother   . Heart disease Mother   . Diabetes Mother   . Arthritis Mother   . Hyperlipidemia Mother   . Hypertension Mother   . Breast cancer Mother   . Heart attack Father   . Heart disease Father   . Alcohol abuse Father   . Early death Father   . Arthritis Sister   . Heart disease Sister   . Hyperlipidemia Sister   . Asthma Sister   . COPD Sister   . Diabetes Sister   . Alcohol abuse Sister    Allergies as of 09/18/2019      Reactions   Gabapentin Other (See Comments)   Paranoia/hallucinations   Hydrocodone Other (See Comments)   PT reports  Confusion when tacking Hydrocodone   Nsaids    History of heart disease, CAD with history of MI, CABG status post stent   Prednisone    Paranoia   Tramadol     Penicillins Other (See Comments)   Childhood unsure if still allgeric      Medication List       Accurate as of September 18, 2019 11:59 PM. If you have any questions, ask your nurse or doctor.        Accu-Chek FastClix Lancets Misc   Accu-Chek Guide test strip Generic drug: glucose blood Use to check sugars up to three times daily DX11.9   aspirin EC 81 MG tablet Take 1 tablet (81 mg total) by mouth daily.   blood glucose meter kit and supplies check blood sugar in the morning fasting and random check in the afternoon   blood glucose meter kit and supplies Kit Dispense based on patient and insurance preference. Use up to four times daily as directed. DX E11.9   dapagliflozin propanediol 5 MG Tabs tablet Commonly known as: FARXIGA Take 5 mg by mouth daily.   diazepam 5 MG tablet Commonly known as: VALIUM Take 1 tablet (5 mg total) by mouth every 8 (eight) hours as needed for anxiety.   glipiZIDE 10 MG tablet Commonly known as: GLUCOTROL TAKE  (1)  TABLET TWICE A DAY.   lisinopril-hydrochlorothiazide 20-25 MG tablet Commonly known as: ZESTORETIC Take 2 tablets by mouth daily.   metoprolol succinate 25 MG 24 hr tablet Commonly known as: TOPROL-XL Take 1 tablet (25 mg total) by mouth daily.   naproxen 500 MG tablet Commonly known as: NAPROSYN Every 12 hours with food as needed only What changed:   how much to take  how to take this  when to take this  reasons to take this  additional instructions Changed by: Howard Pouch, DO   Omega 3 1000 MG Caps Take 1 capsule (1,000 mg total) by mouth daily.   rosuvastatin 20 MG tablet Commonly known as: CRESTOR Take 1 tablet (20 mg total) by mouth daily.   SitaGLIPtin-MetFORMIN HCl [id number] MG Tb24 Take 1 tablet by mouth daily.   traZODone 50 MG tablet Commonly known  as: DESYREL Take 1 tablet (50 mg total) by mouth at bedtime as needed for sleep.       All past medical history, surgical history, allergies,  family history, immunizations andmedications were updated in the EMR today and reviewed under the history and medication portions of their EMR.     ROS: Negative, with the exception of above mentioned in HPI   Objective:  BP (!) 84/52 (BP Location: Right Arm, Patient Position: Sitting, Cuff Size: Normal)   Pulse 61   Temp 98.2 F (36.8 C) (Temporal)   Resp 18   Ht _0  (1.803 m)   Wt 199 lb 8 oz (90.5 kg)   SpO2 96%   BMI 27.82 kg/m  Body mass index is 27.82 kg/m. Gen: Afebrile. No acute distress. Nontoxic in appearance, well developed, well nourished.  Appears fatigued today. HENT: AT. [place]. Bilateral TM visualized without erythema or bulging membrane normal EAC.. MMM, no oral lesions. Bilateral nares without erythema, swelling or drainage. Throat without erythema or exudates.  No cough, no shortness of breath, no hoarseness. Eyes:Pupils Equal Round Reactive to light, Extraocular movements intact,  Conjunctiva without redness, discharge or icterus. Neck/lymp/endocrine: Supple, no lymphadenopathy CV: RRR no murmur, no edema Chest: CTAB, no wheeze or crackles. Good air movement, normal resp effort.  Abd: Soft.  Flat. NTND. BS present.  Skin: No rashes, purpura or petechiae.  Neuro:  Normal gait. PERLA. EOMi. Alert. Oriented x3  Psych: Normal affect, dress and demeanor. Normal speech. Normal thought content and judgment.  No exam data present No results found. Results for orders placed or performed in visit on 09/18/19 (from the past 24 hour(s))  CBC w/Diff     Status: Abnormal   Collection Time: 09/18/19  1:47 PM  Result Value Ref Range   WBC 10.3 4.0 - 10.5 K/uL   RBC 4.97 4.22 - 5.81 Mil/uL   Hemoglobin 15.0 13.0 - 17.0 g/dL   HCT 44.7 39.0 - 52.0 %   MCV 89.9 78.0 - 100.0 fl   MCHC 33.6 30.0 - 36.0 g/dL   RDW 13.9 11.5 - 15.5 %   Platelets 225.0 150.0 - 400.0 K/uL   Neutrophils Relative % 72.4 43.0 - 77.0 %   Lymphocytes Relative 15.6 12.0 - 46.0 %   Monocytes Relative  11.0 3.0 - 12.0 %   Eosinophils Relative 0.5 0.0 - 5.0 %   Basophils Relative 0.5 0.0 - 3.0 %   Neutro Abs 7.4 1.4 - 7.7 K/uL   Lymphs Abs 1.6 0.7 - 4.0 K/uL   Monocytes Absolute 1.1 (H) 0.1 - 1.0 K/uL   Eosinophils Absolute 0.1 0.0 - 0.7 K/uL   Basophils Absolute 0.1 0.0 - 0.1 K/uL  Comp Met (CMET)     Status: Abnormal   Collection Time: 09/18/19  1:47 PM  Result Value Ref Range   Sodium 133 (L) 135 - 145 mEq/L   Potassium 4.4 3.5 - 5.1 mEq/L   Chloride 95 (L) 96 - 112 mEq/L   CO2 29 19 - 32 mEq/L   Glucose, Bld 110 (H) 70 - 99 mg/dL   BUN 19 6 - 23 mg/dL   Creatinine, Ser 0.91 0.40 - 1.50 mg/dL   Total Bilirubin 0.4 0.2 - 1.2 mg/dL   Alkaline Phosphatase 44 39 - 117 U/L   AST 16 0 - 37 U/L   ALT 20 0 - 53 U/L   Total Protein 7.8 6.0 - 8.3 g/dL   Albumin 4.6 3.5 - 5.2 g/dL  Calcium 9.7 8.4 - 10.5 mg/dL   GFR 83.58 >60.00 mL/min  Lactate Dehydrogenase (LDH)     Status: None   Collection Time: 09/18/19  1:47 PM  Result Value Ref Range   LDH 127 120 - 250 U/L  Respiratory virus panel     Status: None   Collection Time: 09/18/19  1:47 PM   Specimen: Blood  Result Value Ref Range   Influenza A RNA Not Detected Not Detected   Influenza B RNA Not Detected Not Detected   RSV RNA Not Detected Not Detected   hMPV Not Detected Not Detected  Strep Complete Panel     Status: None   Collection Time: 09/18/19  1:47 PM   Specimen: Blood  Result Value Ref Range   Strep Pyogenes negative Negative   Strep dysgalactiae negative Negative    Assessment/Plan: EAGAN SHIFFLETT is a 63 y.o. male present for OV for  Depression/anxiety/insomnia: -Has a history of a rather significant depression with anxiety with paranoid features.  - Improved greatly with start of paxil.>>   However patient discontinued on his home, but reports he is still doing well. -Continued/refilled trazodone 50 mg nightly. -Continue/refilled Valium 5 mg TID as needed.He is aware it is a controlled substance.   -  [place] controlled substance database reviewed 09/19/19.  Patient aware he needs a face-to-face appointment in order for any refills to be processed -Contract signed -Offered referral to counseling or psychiatry;he declined.   Diabetes, uncontrolled/diabetic peripheral neuropathy/mild nonproliferative diabetic retinopathy of both eyes/overweight: Patient reports he has been a diabetic since 1998. -Continue Janumet [phone] daily-refills provided today - Continue glipizide 10 mg BID.    Refills provided today -Continue  farixga 5 mg daily-refills provided today.  Patient was encouraged if he starts to develop any hypoglycemic symptoms or sugars routinely in the 70s>> he is to stop [place].  He is doing so well with his weight loss and dietary changes, we may start seeing him need less medication now. PNA series:Pneumovax 23 03/28/2014-->repeat after August 2020 when he will be 63>> postpone this until next visit secondary to him being ill. Flu shot:  Due today, postponed secondary to illness(recommneded yearly) BMP: 09/18/2019 normal GFR and creatinine.  Mild hyponatremia with a sodium of 133. Foot exam:Completed 11/29/2018-referral to podiatry Eye exam: Completed 01/08/2019-[place] A1c:10.4>>>9.4>>  9.7>>11.8>> 8.5 >>> 6.5 today !!!  Follow-up 3 months.  Hypertension/CAD/S/p Stent/prior MI/hypercholesteremia:Pt has a significant cardiac history of "anterior MI in 2004 with stents was LAD and then perhaps stents to a branch vessel 2008(by DR. Renaldo- cardio note 2015)" -Continue aspirin 81 mg daily. Cardiology told him he no longer needed the  Plavix 75 mg daily -Continue metoprolol 25 mg daily QD - Continue Lisinopril- HCTZ  40-50 mg daily refilled for him today.  However he was told to not take medications tomorrow and to monitor secondary to hypotension and acute illness versus hypotension secondary to not eating as much medicine for his blood pressure now that he has made  all the dietary changes and is losing weight. -Continue Crestor 20 mg daily - labs UTD Diet:Low-sodium Exercise:Routine exercise daily - now established with cardiology as well for routine follow up.   Sore throat/hypotension/fatigue: Lengthy discussion with patient today surrounding his hypotension and fatigue.  He had a mild sore throat as well but this has pretty much resolved for him.  Uncertain if his symptoms today are from an acute illness versus blood pressure medications.  He has been losing a great  deal of weight, exercising and has drastically changed his diet to be more healthy.  There is a possibility he does not need as much blood pressure coverage now with the above changes. -CBC, CMP, LDH, strep swab, respiratory panel collected today. -Patient encouraged to go home rest, hydrate.  Hydration should include both water and G2 for electrolytes.  Patient reports understanding. -Novel coronavirus lab ordered.  Patient understands to self isolate. -He is to monitor his blood pressures closely over the next week.  I have recommended he not take his lisinopril/HCTZ combo pills tomorrow.  Only take 1 lisinopril HCTZ daily starting Friday if blood pressures are greater than 979 systolic.  If they are below 140 he is to not take any lisinopril/HCTZ.  All other medications remain the same.   Reviewed expectations re: course of current medical issues.  Discussed self-management of symptoms.  Outlined signs and symptoms indicating need for more acute intervention.  Patient verbalized understanding and all questions were answered.  Patient received an After-Visit Summary.    Orders Placed This Encounter  Procedures  . Novel Coronavirus, NAA ([facility])  . Respiratory virus panel  . Strep Complete Panel  . CBC w/Diff  . Comp Met (CMET)  . Lactate Dehydrogenase (LDH)  . POCT glycosylated hemoglobin (Hb A1C)    Greater than 40 minutes spent with patient, >50% of time spent face  to face     Note is dictated utilizing voice recognition software. Although note has been proof read prior to signing, occasional typographical errors still can be missed. If any questions arise, please do not hesitate to call for verification.   electronically signed by:  Howard Pouch, DO  [place]

## 2019-09-19 ENCOUNTER — Telehealth: Payer: Self-pay | Admitting: Family Medicine

## 2019-09-19 ENCOUNTER — Encounter: Payer: Self-pay | Admitting: Family Medicine

## 2019-09-19 LAB — LACTATE DEHYDROGENASE: LDH: 127 U/L (ref 120–250)

## 2019-09-19 MED ORDER — DIAZEPAM 5 MG PO TABS: 5.0000 mg | ORAL_TABLET | Freq: Three times a day (TID) | ORAL | 5 refills | Status: DC | PRN

## 2019-09-19 MED ORDER — NAPROXEN 500 MG PO TABS: ORAL_TABLET | ORAL | 1 refills | Status: DC

## 2019-09-19 NOTE — Telephone Encounter (Signed)
Pt was educated on when to go to ED if he had SOB, fever, or started feeling worse. Pt agreed and was going to get COVID testing today. Encouraged patient to stay hydrated with water and G2, he verbalized understanding

## 2019-09-19 NOTE — Telephone Encounter (Signed)
Pt was called with no answer, VM was left to return call

## 2019-09-19 NOTE — Telephone Encounter (Signed)
Please inform patient the following information: His blood labs are normal. No major systemic infection obvious by labs.  I would still recommend he have the covid testing to be safe in the event infection  Is too early to be obvious via his blood work.   - He was to not take his lisinopril/hctz BP pill today. Continue all other meds.  - He is to take his BP 2x today- this afternoon and after dinner. If BP > 140 on the top he can take one of the lisinopril (just one a day).  - Tomorrow take BP in the morning if above 140 then take one of the lisinopril. He is to do this daily in the morning and report back to [place] Monday via phone with  his BP readings and if he has need to take lisinopril combo pill at all.  >> there is a possibility he just needs less BP medications since he is losing weight and getting healthier.

## 2019-09-19 NOTE — Telephone Encounter (Signed)
Pt was called and given information/results. He verbalized understanding

## 2019-10-09 ENCOUNTER — Ambulatory Visit: Payer: PRIVATE HEALTH INSURANCE | Admitting: Podiatry

## 2019-10-09 ENCOUNTER — Encounter (INDEPENDENT_AMBULATORY_CARE_PROVIDER_SITE_OTHER): Payer: BLUE CROSS/BLUE SHIELD | Admitting: Ophthalmology

## 2019-10-14 ENCOUNTER — Telehealth: Payer: Self-pay | Admitting: Family Medicine

## 2019-10-14 ENCOUNTER — Other Ambulatory Visit: Payer: Self-pay | Admitting: Family Medicine

## 2019-10-14 DIAGNOSIS — F411 Generalized anxiety disorder: Secondary | ICD-10-CM

## 2019-10-14 MED ORDER — DIAZEPAM 5 MG PO TABS: 5.0000 mg | ORAL_TABLET | Freq: Three times a day (TID) | ORAL | 0 refills | Status: DC | PRN

## 2019-10-14 NOTE — Telephone Encounter (Signed)
Called number of friend to let patient know his medication was called in. lvm to call back

## 2019-10-14 NOTE — Telephone Encounter (Signed)
Patient called again asking for medication to be done before 5pm if possible

## 2019-10-14 NOTE — Telephone Encounter (Signed)
[name] called again asking if this will be done by 5pm because he has a friend who will drive him to the pharmacy. The friends number is [phone] and he would like a call back on that number. Pt was advised this may not be answered by 5pm as it is already 3:49pm and this is a scheduled medication, but he will be called ASAP.

## 2019-10-14 NOTE — Telephone Encounter (Signed)
6 pills prescribed- no further pills will be able to be given. If getting prescription is an issue- I would suggest he transfer this script back to local pharmacy in the future.

## 2019-10-14 NOTE — Telephone Encounter (Signed)
[place] called to advised that patient's Rx for Diazepam is stuck in [facility] mail. Please send Rx to [facility] for 5 pills to tide patient over until the mail delivers his meds.

## 2019-10-14 NOTE — Addendum Note (Signed)
Addended by: Howard Pouch A on: 10/14/2019 04:55 PM   Modules accepted: Orders

## 2019-10-17 NOTE — Progress Notes (Signed)
Virtual Visit via Video Note changed to phone visit at patient request   This visit type was conducted due to national recommendations for restrictions regarding the COVID-19 Pandemic (e.g. social distancing) in an effort to limit this patient's exposure and mitigate transmission in our community.  Due to his co-morbid illnesses, this patient is at least at moderate risk for complications without adequate follow up.  This format is felt to be most appropriate for this patient at this time.  All issues noted in this document were discussed and addressed.  A limited physical exam was performed with this format.  Please refer to the patient's chart for his consent to telehealth for [facility].   Date:  10/18/2019   ID:  [name], DOB 02/04/1956, MRN [id number]  Patient Location:Home Provider Location: Home  PCP:  Ma Hillock, DO  Cardiologist:  Dr Stanford Breed  Evaluation Performed:  Follow-Up Visit  Chief Complaint:  FU CAD  History of Present Illness:    FU CAD. Patient has had previous PCI of his LAD in 2004 in setting of MI. Patient had PCI of RCA in 2008. Follow-up catheterization in 2008 showed patent stents. Last echocardiogram July 2018 showed ejection fraction 45 to 50%, mild aortic stenosis with mean gradient 9 mmHg and mild right ventricular enlargement. Nuclear study July 2018 showed LAD infarct but no ischemia. Since last seen, no dyspnea, palpitations or syncope; no pedal edema; occasional sharp chest pain for 30 sec but no exertional CP; symptoms unchanged.  The patient does not have symptoms concerning for COVID-19 infection (fever, chills, cough, or new shortness of breath).    Past Medical History:  Diagnosis Date  . Alcohol addiction ([place])   . Anxiety   . Arthritis   . Bulging of lumbar intervertebral disc 2017   MRI: L3-4, L4-5 and L5-S1- facet hypertrophy and formainal stenosis  . CAD (coronary artery disease)   . Coronary artery disease with hx of  myocardial infarct w/o hx of CABG 2004  . Depression   . Dextrocardia   . Diabetes ([place])   . Heart attack [facility]) 2004   Stent placement 2008  . Hyperlipidemia   . Hypertension   . Insomnia    Past Surgical History:  Procedure Laterality Date  . CORONARY ANGIOPLASTY WITH STENT PLACEMENT  2008     Current Meds  Medication Sig  . Accu-Chek FastClix Lancets MISC   . aspirin EC 81 MG tablet Take 1 tablet (81 mg total) by mouth daily.  . blood glucose meter kit and supplies KIT Dispense based on patient and insurance preference. Use up to four times daily as directed. DX E11.9  . blood glucose meter kit and supplies check blood sugar in the morning fasting and random check in the afternoon  . dapagliflozin propanediol (FARXIGA) 5 MG TABS tablet Take 5 mg by mouth daily.  . diazepam (VALIUM) 5 MG tablet Take 1 tablet (5 mg total) by mouth every 8 (eight) hours as needed for anxiety.  Marland Kitchen glipiZIDE (GLUCOTROL) 10 MG tablet TAKE  (1)  TABLET TWICE A DAY.  Marland Kitchen glucose blood (ACCU-CHEK GUIDE) test strip Use to check sugars up to three times daily DX11.9  . lisinopril-hydrochlorothiazide (ZESTORETIC) 20-25 MG tablet Take 2 tablets by mouth daily.  . metoprolol succinate (TOPROL-XL) 25 MG 24 hr tablet Take 1 tablet (25 mg total) by mouth daily.  . naproxen (NAPROSYN) 500 MG tablet Every 12 hours with food as needed only  . Omega 3 1000  MG CAPS Take 1 capsule (1,000 mg total) by mouth daily.  . rosuvastatin (CRESTOR) 20 MG tablet Take 1 tablet (20 mg total) by mouth daily.  . SitaGLIPtin-MetFORMIN HCl [phone] MG TB24 Take 1 tablet by mouth daily.  . traZODone (DESYREL) 50 MG tablet Take 1 tablet (50 mg total) by mouth at bedtime as needed for sleep.     Allergies:   Gabapentin, Hydrocodone, Nsaids, Prednisone, Tramadol, and Penicillins   Social History   Tobacco Use  . Smoking status: Never Smoker  . Smokeless tobacco: Never Used  Substance Use Topics  . Alcohol use: Yes    Alcohol/week: 0.0  standard drinks    Comment: Occasional  . Drug use: Not Currently    Types: Cocaine, Marijuana    Comment: Former     Family Hx: The patient's family history includes Alcohol abuse in his father and sister; Arthritis in his mother and sister; Asthma in his sister; Breast cancer in his mother; COPD in his sister; Diabetes in his mother and sister; Early death in his father; Heart attack in his father and mother; Heart disease in his father, mother, and sister; Hyperlipidemia in his mother and sister; Hypertension in his mother.  ROS:   Please see the history of present illness.    No Fever, chills  or productive cough; Low back pain noted. All other systems reviewed and are negative.   Recent Labs: 11/29/2018: TSH 2.32 09/18/2019: ALT 20; BUN 19; Creatinine, Ser 0.91; Hemoglobin 15.0; Platelets 225.0; Potassium 4.4; Sodium 133   Recent Lipid Panel Lab Results  Component Value Date/Time   CHOL 149 02/12/2019 09:17 AM   TRIG 216.0 (H) 02/12/2019 09:17 AM   HDL 39.10 02/12/2019 09:17 AM   CHOLHDL 4 02/12/2019 09:17 AM   LDLCALC 44 04/19/2018   LDLDIRECT 87.0 02/12/2019 09:17 AM    Wt Readings from Last 3 Encounters:  10/18/19 196 lb (88.9 kg)  09/18/19 199 lb 8 oz (90.5 kg)  07/04/19 201 lb 8 oz (91.4 kg)     Objective:    Vital Signs:  BP 110/69   Pulse 63   Ht _0  (1.778 m)   Wt 196 lb (88.9 kg)   BMI 28.12 kg/m    VITAL SIGNS:  reviewed NAD Answers questions appropriately Normal affect Remainder of physical examination not performed (telehealth visit; coronavirus pandemic)  ASSESSMENT & PLAN:    1. CAD-continue medical therapy with ASA and statin. 2. Hypertension-BP controlled; continue present meds and follow. 3. Hyperlipidemia-increase crestor to 40 mg daily; check lipids in liver in 12 weeks. 4. H/O mild AS-repeat echo. 5. CP-symptoms chronic and unchanged; last nuclear study showed no ischemia; no further evaluation at this time.  COVID-19 Education:  The importance of social distancing was discussed today.  Time:   Today, I have spent 17 minutes with the patient with telehealth technology discussing the above problems.     Medication Adjustments/Labs and Tests Ordered: Current medicines are reviewed at length with the patient today.  Concerns regarding medicines are outlined above.   Tests Ordered: No orders of the defined types were placed in this encounter.   Medication Changes: No orders of the defined types were placed in this encounter.   Follow Up:  Either In Person or Virtual in 6 month(s)  Signed, Kirk Ruths, MD  10/18/2019 8:50 AM    [place]

## 2019-10-18 ENCOUNTER — Encounter: Payer: Self-pay | Admitting: Cardiology

## 2019-10-18 ENCOUNTER — Telehealth (INDEPENDENT_AMBULATORY_CARE_PROVIDER_SITE_OTHER): Payer: Medicare HMO | Admitting: Cardiology

## 2019-10-18 VITALS — BP 110/69 | HR 63 | Ht 70.0 in | Wt 196.0 lb

## 2019-10-18 DIAGNOSIS — I350 Nonrheumatic aortic (valve) stenosis: Secondary | ICD-10-CM | POA: Diagnosis not present

## 2019-10-18 DIAGNOSIS — I10 Essential (primary) hypertension: Secondary | ICD-10-CM | POA: Diagnosis not present

## 2019-10-18 DIAGNOSIS — E7800 Pure hypercholesterolemia, unspecified: Secondary | ICD-10-CM

## 2019-10-18 DIAGNOSIS — R072 Precordial pain: Secondary | ICD-10-CM

## 2019-10-18 DIAGNOSIS — I2510 Atherosclerotic heart disease of native coronary artery without angina pectoris: Secondary | ICD-10-CM | POA: Diagnosis not present

## 2019-10-18 MED ORDER — ROSUVASTATIN CALCIUM 40 MG PO TABS: 40.0000 mg | ORAL_TABLET | Freq: Every day | ORAL | 3 refills | Status: DC

## 2019-10-18 MED ORDER — NITROGLYCERIN 0.4 MG SL SUBL: 0.4000 mg | SUBLINGUAL_TABLET | SUBLINGUAL | 0 refills | Status: DC | PRN

## 2019-10-18 NOTE — Patient Instructions (Signed)
Medication Instructions:  INCREASE ROSUVASTATIN TO 40 MG ONCE DAILY= 2 OF THE 20 MG TABLETS ONCE DAILY  *If you need a refill on your cardiac medications before your next appointment, please call your pharmacy*  Lab Work: Your physician recommends that you return for lab work in: [place]  If you have labs (blood work) drawn today and your tests are completely normal, you will receive your results only by: Marland Kitchen MyChart Message (if you have MyChart) OR . A paper copy in the mail If you have any lab test that is abnormal or we need to change your treatment, we will call you to review the results.  Testing/Procedures: Your physician has requested that you have an echocardiogram. Echocardiography is a painless test that uses sound waves to create images of your heart. It provides your doctor with information about the size and shape of your heart and how well your heart's chambers and valves are working. This procedure takes approximately one hour. There are no restrictions for this procedure.[place]   Follow-Up: At [facility], you and your health needs are our priority.  As part of our continuing mission to provide you with exceptional heart care, we have created designated Provider Care Teams.  These Care Teams include your primary Cardiologist (physician) and Advanced Practice Providers (APPs -  Physician Assistants and Nurse Practitioners) who all work together to provide you with the care you need, when you need it.  Your next appointment:   12 month(s)  The format for your next appointment:   Either In Person or Virtual  Provider:   You may see Kirk Ruths MD or one of the following Advanced Practice Providers on your designated Care Team:    Kerin Ransom, PA-C  [place], [place]  Coletta Memos, [place]

## 2019-11-19 ENCOUNTER — Telehealth: Payer: Self-pay

## 2019-11-19 MED ORDER — DAPAGLIFLOZIN PROPANEDIOL 5 MG PO TABS: 5.0000 mg | ORAL_TABLET | Freq: Every day | ORAL | 0 refills | Status: DC

## 2019-11-19 NOTE — Telephone Encounter (Signed)
Please explained to Mr. Allbee that it is not the mail-in pharmacy giving him the shaft- it is him using his medications differently than prescribed.  The mail-in pharmacy is are not allowed to provide the patient's with more medication than what is prescribed by the doctor.  You must take the medications as the label reads or he risks not being able to have his medications refilled on time.  1.  Have they already mailed the refill from his mail-in pharmacy to him and it is in transit or how long does he have to wait before they would mail it?  2.  How much much of the [place] does he need to be called into the local pharmacy?   If he is requiring a higher dose of Farxiga now, I am willing to call in a 10 mg tab.  This should allow him to get a prescription refilled if there are changes.  However, in the future if he feels he needs a different dose of medication, he needs to make an appointment to discuss that prior to changing himself.

## 2019-11-19 NOTE — Telephone Encounter (Signed)
Pt called and LM on nurses line asking for a return call. Pt was called and he stated he was getting "shafted" by his mail order pharmacy.   Pt said his cardiologist increased his Statin medication to 40mg  and now his blood sugars will not stay below 160 without taking 10mg  of Farxiga (which is double the dose RX). The mail order pharmacy told him he cannot get his medication refilled because it is early. Pt took last pill today because he has been doubling the dose daily. Please advise.   Short supply will need to be sent to local pharmacy due to being out today. Pt will call after christmas to schedule appt.

## 2019-11-19 NOTE — Telephone Encounter (Addendum)
Please inform patient I did call in a 30-day supply to his local pharmacy of Farxiga 5 mg daily.  Please remind patient he is to only take 1/day.   The stress to him to bring in his written down fasting sugars to his appointment.  We need to check his sugars before increasing medication.

## 2019-11-19 NOTE — Addendum Note (Signed)
Addended by: Howard Pouch A on: 11/19/2019 04:37 PM   Modules accepted: Orders

## 2019-11-19 NOTE — Telephone Encounter (Signed)
PT states it takes 7-10 business days but they were still processing the medication to be shipped and it is is Christmas so could be delayed even longer. He was scheduled for a [facility] appt.   Pt was educated on why the pharmacy could not refill early and why he  Could not increase dose without speaking to Dr Raoul Pitch. Pt verbalized understanding

## 2019-11-19 NOTE — Telephone Encounter (Signed)
Pt was called on house phone and cell phone with no answer. VM was left on home VM to return call.

## 2019-11-20 NOTE — Telephone Encounter (Signed)
Pt was called and message was left with information.

## 2019-12-25 ENCOUNTER — Ambulatory Visit: Payer: Medicare HMO | Admitting: Family Medicine

## 2019-12-26 ENCOUNTER — Ambulatory Visit: Payer: Medicare HMO | Admitting: Family Medicine

## 2019-12-30 ENCOUNTER — Encounter: Payer: Self-pay | Admitting: Family Medicine

## 2019-12-30 ENCOUNTER — Other Ambulatory Visit: Payer: Self-pay

## 2019-12-30 ENCOUNTER — Ambulatory Visit (INDEPENDENT_AMBULATORY_CARE_PROVIDER_SITE_OTHER): Payer: Medicare Other | Admitting: Family Medicine

## 2019-12-30 VITALS — BP 98/50 | HR 55 | Temp 97.9°F | Resp 17 | Ht 70.0 in | Wt 202.0 lb

## 2019-12-30 DIAGNOSIS — I252 Old myocardial infarction: Secondary | ICD-10-CM

## 2019-12-30 DIAGNOSIS — E118 Type 2 diabetes mellitus with unspecified complications: Secondary | ICD-10-CM

## 2019-12-30 DIAGNOSIS — E119 Type 2 diabetes mellitus without complications: Secondary | ICD-10-CM

## 2019-12-30 DIAGNOSIS — I2510 Atherosclerotic heart disease of native coronary artery without angina pectoris: Secondary | ICD-10-CM | POA: Diagnosis not present

## 2019-12-30 DIAGNOSIS — I10 Essential (primary) hypertension: Secondary | ICD-10-CM

## 2019-12-30 DIAGNOSIS — G4700 Insomnia, unspecified: Secondary | ICD-10-CM

## 2019-12-30 DIAGNOSIS — E663 Overweight: Secondary | ICD-10-CM

## 2019-12-30 DIAGNOSIS — E1142 Type 2 diabetes mellitus with diabetic polyneuropathy: Secondary | ICD-10-CM

## 2019-12-30 DIAGNOSIS — E7800 Pure hypercholesterolemia, unspecified: Secondary | ICD-10-CM

## 2019-12-30 DIAGNOSIS — Z23 Encounter for immunization: Secondary | ICD-10-CM

## 2019-12-30 DIAGNOSIS — E113293 Type 2 diabetes mellitus with mild nonproliferative diabetic retinopathy without macular edema, bilateral: Secondary | ICD-10-CM

## 2019-12-30 DIAGNOSIS — F411 Generalized anxiety disorder: Secondary | ICD-10-CM

## 2019-12-30 DIAGNOSIS — Z955 Presence of coronary angioplasty implant and graft: Secondary | ICD-10-CM

## 2019-12-30 LAB — LIPID PANEL
Cholesterol: 100 mg/dL (ref 0–200)
HDL: 42.1 mg/dL (ref >39.00)
LDL Cholesterol: 33 mg/dL (ref 0–99)
NonHDL: 57.65
Total CHOL/HDL Ratio: 2
Triglycerides: 122 mg/dL (ref 0.0–149.0)
VLDL: 24.4 mg/dL (ref 0.0–40.0)

## 2019-12-30 LAB — POCT GLYCOSYLATED HEMOGLOBIN (HGB A1C)
HbA1c POC (<> result, manual entry): 7.1 % (ref 4.0–5.6)
HbA1c, POC (controlled diabetic range): 7.1 % — AB (ref 0.0–7.0)
HbA1c, POC (prediabetic range): 7.1 % — AB (ref 5.7–6.4)
Hemoglobin A1C: 7.1 % — AB (ref 4.0–5.6)

## 2019-12-30 LAB — TSH: TSH: 1.86 u[IU]/mL (ref 0.35–4.50)

## 2019-12-30 MED ORDER — FARXIGA 10 MG PO TABS: 10.0000 mg | ORAL_TABLET | Freq: Every day | ORAL | 5 refills | Status: DC

## 2019-12-30 MED ORDER — DIAZEPAM 5 MG PO TABS: 5.0000 mg | ORAL_TABLET | Freq: Four times a day (QID) | ORAL | 5 refills | Status: DC | PRN

## 2019-12-30 MED ORDER — METOPROLOL SUCCINATE ER 25 MG PO TB24: 25.0000 mg | ORAL_TABLET | Freq: Every day | ORAL | 1 refills | Status: DC

## 2019-12-30 MED ORDER — NAPROXEN 500 MG PO TABS: ORAL_TABLET | ORAL | 1 refills | Status: DC

## 2019-12-30 MED ORDER — GLIPIZIDE 10 MG PO TABS: ORAL_TABLET | ORAL | 1 refills | Status: DC

## 2019-12-30 MED ORDER — SITAGLIP PHOS-METFORMIN HCL ER 100-1000 MG PO TB24: 1.0000 | ORAL_TABLET | Freq: Every day | ORAL | 1 refills | Status: DC

## 2019-12-30 MED ORDER — LISINOPRIL-HYDROCHLOROTHIAZIDE 20-25 MG PO TABS: 1.0000 | ORAL_TABLET | Freq: Every day | ORAL | 1 refills | Status: DC

## 2019-12-30 MED ORDER — TRAZODONE HCL 50 MG PO TABS: 50.0000 mg | ORAL_TABLET | Freq: Every evening | ORAL | 1 refills | Status: DC | PRN

## 2019-12-30 NOTE — Progress Notes (Signed)
[name] , 05/09/1956, 64 y.o., male MRN: [id number] Patient Care Team    Relationship Specialty Notifications Start End  Ma Hillock, DO PCP - General Family Medicine  11/29/18   Bernarda Caffey, MD Consulting Physician Ophthalmology  01/09/19     Chief Complaint  Patient presents with  . Diabetes    Needs refills on medications.      Subjective: [name] is a 64 y.o. male present today for [facility]  Depression/anxiety/insomnia: Today patient reports his Valium 5 mg 3 times daily does not last him long enough.  Its affective points in his system, but "wears off too soon.  " He is using trazodone 50 mg daily at bedtime-and still finds is helpful.    Feels he is doing rather well on this combination.  Patient had been on Paxil low-dose in 2020.  He stopped using it because he felt his anxiety was better controlled and he did not need it any longer. Original note: Patient reports he has been treated for his depression, anxiety and insomnia with Valium 5 mg many years. He states he used to be treated with Xanax and that medication caused him to have difficulties with memory. He states he takes his Valium about 3 times a day. He used to only need the Valium twice a day. Also has been prescribed Ambien, which she states does not work at all. He reports having depression and anxiety greater than 15 to 20 years. Feels his anxiety became much more intense after he found his girlfriend greater than 20 years dead in their bed from COPD complications. He admits he had problems before she passed away, however she had always been able to calm him down. He states he has a rather routine day. He does not have much social contact in a day. He lives by himself. He reports that he always feels like he "stays on edge. "He reports when he wakes up he is already feeling on edge and he will take a Valium at that time. He then has breakfast and goes on a 4 mile walk daily. For lunch he starts to  feel his anxiety sudden and he becomes panicked. He states when he feels this way he will break out in a sweat, become very fearful and scared. He states he commonly fears there is someone in the house with a gun. He goes to his bedroom gets in his bed and because his sister. He states the fear is so intense and he just cannot control it. He denies any known trauma or event experienced in his life that would create such a fear. He states he did go see a counselor/psychiatrist at [facility] in the past6 months, and he did not feel that was helpful for him. Denies any SI or HI.  Diabetes/overweight: Patient reports he has been a diabetic since 1998.  He reports compliance with Janumet/Metformin 1000-100 mg daily, glipizide 10 mg twice daily, Farxiga 10 mg daily. He reports his fasting glucoses have been more elevated since starting the higher dose cholesterol medication.  Therefore he increased his Farxiga from 5 mg to 10 mg. PNA series:Pneumovax 23- completed today-12/30/2019. prevnar completed 2020. Flu shot: completed today 12/30/2019.  BMP: 09/18/2019 normal GFR and creatinine.  Mild hyponatremia with a sodium of 133. Foot exam:Completed 11/29/2018-referral to podiatry.  Eye exam: Completed 01/08/2019-[facility]>>encouraged to schedule.  A1c:10.4>>>9.4>>  9.7>>11.8>> 8.5 >>> 6.5 >>>7.1 today.  Hypertension/CAD/S/p Stent/prior MI: Pt has a significant cardiac history of "anterior  MI in 2004 with stents was LAD and then perhaps stents to branch vessel 2008(by DR. Renaldo-cardio note 2015)" Pt was having hypotensive symptoms with hyponatremia.  He was told to decrease his lisinopril HCTZ to 1 tablet daily.  He admits today he did not remember to do that.  Therefore he is still taking lisinopril 40-HCTZ 50.  He is compliant with baby aspirin 81 mg daily.  Plavix was DC'd by cards.  His statin was increased by cardiology to Crestor 40 mg daily.  Patient reports compliance with metoprolol 25 mg  daily Labs collected today Diet:Low-sodium Exercise:Routine exercise daily KK:[id number], hyperlipidemia, diabetes, overweight, family history,CAD, MI  Neck pain/back pain: Patient states he rarely needs to use naproxen but when he does have a headache and neck pain, it is effective. Prior note: Patient has had recurrent posterior neck pain and headache.  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  He states he found an old prescription of naproxen and restarted it the last time he had symptoms.  He states it worked well for him.  He is wondering if we could get refills for him today.  In the past he has stated he could not tolerate NSAIDs.    Erectile dysfunction: Patient would like to start a medication for erectile dysfunction.  He states he seen a urologist many years ago and does not desire to see a urologist again.  Patient has a significant medical history of blood pressure with systolic less than 343 and history of CAD and MI  Depression screen [facility] 2/9 06/04/2019 02/12/2019 11/29/2018  Decreased Interest 3 0 3  Down, Depressed, Hopeless '3 3 3  ' PHQ - 2 Score '6 3 6  ' Altered sleeping 3 0 3  Tired, decreased energy 3 0 3  Change in appetite 3 0 3  Feeling bad or failure about yourself  '1 1 3  ' Trouble concentrating 0 0 3  Moving slowly or fidgety/restless 0 0 2  Suicidal thoughts 0 0 0  PHQ-9 Score '16 4 23  ' Difficult doing work/chores Very difficult Not difficult at all -    Allergies  Allergen Reactions  . Gabapentin Other (See Comments)    Paranoia/hallucinations  . Hydrocodone Other (See Comments)    PT reports  Confusion when tacking Hydrocodone  . Nsaids     History of heart disease, CAD with history of MI, CABG status post stent  . Prednisone     Paranoia  . Tramadol   . 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 Alcohol addiction ([place])   . Anxiety   . Arthritis   . Bulging of lumbar intervertebral disc 2017   MRI: L3-4, L4-5 and L5-S1- facet hypertrophy and formainal stenosis  . CAD (coronary artery disease)   . Coronary artery disease with hx of myocardial infarct w/o hx of CABG 2004  . Depression   . Dextrocardia   . Diabetes ([place])   . Heart attack [facility]) 2004   Stent placement 2008  . Hyperlipidemia   . Hypertension   . Insomnia    Past Surgical History:  Procedure Laterality Date  . CORONARY ANGIOPLASTY WITH STENT PLACEMENT  2008   Family History  Problem Relation Age of Onset  . Heart attack Mother   . Heart disease Mother   . Diabetes Mother   . Arthritis Mother   . Hyperlipidemia Mother   . Hypertension Mother   . Breast cancer Mother   . Heart attack Father   . Heart disease Father   . Alcohol abuse Father   . Early death Father   . Arthritis Sister   . Heart disease Sister   . Hyperlipidemia Sister   . Asthma Sister   . COPD Sister   . Diabetes Sister   . Alcohol abuse Sister    Allergies as of 12/30/2019      Reactions   Gabapentin Other (See Comments)   Paranoia/hallucinations   Hydrocodone Other (See Comments)   PT reports  Confusion when tacking Hydrocodone   Nsaids    History of heart disease, CAD with history of MI, CABG status post stent   Prednisone    Paranoia   Tramadol    Penicillins Other (See Comments)   Childhood unsure if still allgeric      Medication List       Accurate as of December 30, 2019 12:23 PM. If you have any questions, ask your nurse or doctor.        STOP taking these medications   omega-3 acid ethyl esters 1 g capsule Commonly known as: LOVAZA Stopped by: Howard Pouch, DO     TAKE these medications   Accu-Chek FastClix Lancets Misc    Accu-Chek Guide test strip Generic drug: glucose blood Use to check sugars up to three times daily DX11.9   aspirin EC 81 MG tablet Take 1 tablet (81 mg total) by mouth daily.   blood glucose meter kit and supplies check blood sugar in the morning fasting and random check in the afternoon   blood glucose meter kit and supplies Kit Dispense based on patient and insurance preference. Use up to four times daily as directed. DX E11.9   dapagliflozin propanediol 5 MG Tabs tablet Commonly known as: FARXIGA Take 5 mg by mouth daily. What changed: additional instructions   Farxiga 10 MG Tabs tablet Generic drug: dapagliflozin propanediol Take 10 mg by mouth daily before breakfast. What changed: You were already taking a medication with the same name, and this prescription was added. Make sure you understand how and when to take each. Changed by: Howard Pouch, DO   diazepam 5 MG tablet Commonly known as: VALIUM Take 1 tablet (5 mg total) by mouth every 6 (six) hours as needed for anxiety. What changed: when to take this Changed by: Howard Pouch, DO   glipiZIDE 10 MG tablet Commonly known as: GLUCOTROL TAKE  (1)  TABLET TWICE A DAY.   lisinopril-hydrochlorothiazide 20-25 MG tablet Commonly known as: ZESTORETIC Take 1 tablet by mouth daily. What changed: how much to take Changed by: Howard Pouch, DO   metoprolol succinate 25 MG 24 hr tablet Commonly known as: TOPROL-XL Take 1 tablet (25 mg total) by mouth daily.   naproxen 500 MG tablet Commonly known as: NAPROSYN Every 12 hours with food as needed only   nitroGLYCERIN 0.4 MG SL tablet Commonly known as: NITROSTAT Place 1 tablet (0.4 mg total) under the tongue every 5 (five) minutes as needed for chest pain.   Omega 3 1000 MG Caps Take 1 capsule (1,000 mg total) by mouth daily.   rosuvastatin  40 MG tablet Commonly known as: CRESTOR Take 1 tablet (40 mg total) by mouth daily.   SitaGLIPtin-MetFORMIN HCl [id number] MG  Tb24 Take 1 tablet by mouth daily.   traZODone 50 MG tablet Commonly known as: DESYREL Take 1 tablet (50 mg total) by mouth at bedtime as needed for sleep.       All past medical history, surgical history, allergies, family history, immunizations andmedications were updated in the EMR today and reviewed under the history and medication portions of their EMR.     ROS: Negative, with the exception of above mentioned in HPI   Objective:  BP (!) 98/50 (BP Location: Left Arm, Patient Position: Sitting, Cuff Size: Normal)   Pulse (!) 55   Temp 97.9 F (36.6 C) (Temporal)   Resp 17   Ht '5\' 10"'  (1.778 m)   Wt 202 lb (91.6 kg)   SpO2 96%   BMI 28.98 kg/m  Body mass index is 28.98 kg/m. Gen: Afebrile. No acute distress.  Nontoxic in appearance.  Caucasian overweight male. HENT: AT. [place].  Neck/lymp/endocrine: Supple, no lymphadenopathy, no thyromegaly CV: RRR 1/6 systolic murmur, no edema, +2/4 P posterior tibialis pulses Chest: CTAB, no wheeze or crackles Abd: Soft. NTND. BS present.  No masses palpated.  Skin: No rashes, purpura or petechiae.  Neuro:  Normal gait. PERLA. EOMi. Alert. Oriented x3  Psych: Normal affect, dress and demeanor. Normal speech. Normal thought content and judgment.  No exam data present No results found. Results for orders placed or performed in visit on 12/30/19 (from the past 24 hour(s))  POCT glycosylated hemoglobin (Hb A1C)     Status: Abnormal   Collection Time: 12/30/19 11:50 AM  Result Value Ref Range   Hemoglobin A1C 7.1 (A) 4.0 - 5.6 %   HbA1c POC (<> result, manual entry) 7.1 4.0 - 5.6 %   HbA1c, POC (prediabetic range) 7.1 (A) 5.7 - 6.4 %   HbA1c, POC (controlled diabetic range) 7.1 (A) 0.0 - 7.0 %    Assessment/Plan: ROMUALDO PROSISE is a 64 y.o. male present for OV for  Depression/anxiety/insomnia: - Has a history of a rather significant depression with anxiety with paranoid features.    - Improved greatly with start of paxil.>>    However patient discontinued on his home, but reports he is still doing well. -Continue trazodone 50 mg nightly. -May increase Valium 5 mg TID to 4 times daily as needed.He is aware it is a controlled substance.   - [place] controlled substance database reviewed 12/30/19.  Patient aware he needs a face-to-face appointment in order for any refills to be processed -Contract signed -Offered referral to counseling or psychiatry;he declined. Follow-up required every 5 and half months for refills  Diabetes, uncontrolled/diabetic peripheral neuropathy/mild nonproliferative diabetic retinopathy of both eyes/overweight: Patient reports he has been a diabetic since 1998. -Sugars have been more elevated since increased in Crestor.  He is also not as active as he has been since the pandemic. -Continue Janumet [phone] daily - Continue glipizide 10 mg BID.     -Continue farixga 10 mg daily - Patient was encouraged if he starts to develop any hypoglycemic symptoms or sugars routinely in the 70s>> he is to decrease Farxiga to 5 mg.  PNA series:Pneumovax 23- completed today-12/30/2019. prevnar completed 2020. Flu shot: completed today 12/30/2019.  BMP: 09/18/2019 normal GFR and creatinine.  Mild hyponatremia with a sodium of 133. Foot exam:Completed 11/29/2018-referral to podiatry.  Eye exam: Completed 01/08/2019-[facility]>>encouraged to schedule.  A1c:10.4>>>9.4>>  9.7>>11.8>> 8.5 >>> 6.5 >>>7.1 today. Follow-up 3 months.  Hypertension/CAD/S/p Stent/prior MI/hypercholesteremia:Pt has a significant cardiac history of "anterior MI in 2004 with stents was LAD and then perhaps stents to a branch vessel 2008(by DR. Renaldo- cardio note 2015)" -Blood pressures are again low today.  He again was told to only take 1 lisinopril-HCTZ 20-25 mg -Continue metoprolol 25 mg daily -Continue aspirin 81 mg daily. Cardiology told him he no longer needed the  Plavix 75 mg daily -Continue Crestor 40 mg daily -  labs: TSH and lipid panel collected today. Diet:Low-sodium Exercise:Routine exercise daily - now established with cardiology as well for routine follow up.     Reviewed expectations re: course of current medical issues.  Discussed self-management of symptoms.  Outlined signs and symptoms indicating need for more acute intervention.  Patient verbalized understanding and all questions were answered.  Patient received an After-Visit Summary.  Follow-up on chronic conditions every 4 months.  Orders Placed This Encounter  Procedures  . Flu Vaccine QUAD High Dose(Fluad)  . Pneumococcal polysaccharide vaccine 23-valent greater than or equal to 2yo subcutaneous/IM  . TSH  . Lipid panel  . POCT glycosylated hemoglobin (Hb A1C)   Meds ordered this encounter  Medications  . dapagliflozin propanediol (FARXIGA) 10 MG TABS tablet    Sig: Take 10 mg by mouth daily before breakfast.    Dispense:  30 tablet    Refill:  5  . glipiZIDE (GLUCOTROL) 10 MG tablet    Sig: TAKE  (1)  TABLET TWICE A DAY.    Dispense:  180 tablet    Refill:  1  . metoprolol succinate (TOPROL-XL) 25 MG 24 hr tablet    Sig: Take 1 tablet (25 mg total) by mouth daily.    Dispense:  90 tablet    Refill:  1  . SitaGLIPtin-MetFORMIN HCl [id number] MG TB24    Sig: Take 1 tablet by mouth daily.    Dispense:  90 tablet    Refill:  1  . traZODone (DESYREL) 50 MG tablet    Sig: Take 1 tablet (50 mg total) by mouth at bedtime as needed for sleep.    Dispense:  90 tablet    Refill:  1  . lisinopril-hydrochlorothiazide (ZESTORETIC) 20-25 MG tablet    Sig: Take 1 tablet by mouth daily.    Dispense:  90 tablet    Refill:  1    Please DC other lisinopril script.  . naproxen (NAPROSYN) 500 MG tablet    Sig: Every 12 hours with food as needed only    Dispense:  180 tablet    Refill:  1  . diazepam (VALIUM) 5 MG tablet    Sig: Take 1 tablet (5 mg total) by mouth every 6 (six) hours as needed for anxiety.    Dispense:   120 tablet    Refill:  5   Referral Orders  No referral(s) requested today      Patient was counseled on Covid vaccines and provided with information to sign up for the wait list.   Note is dictated utilizing voice recognition software. Although note has been proof read prior to signing, occasional typographical errors still can be missed. If any questions arise, please do not hesitate to call for verification.   electronically signed by:  Howard Pouch, DO  [place]

## 2019-12-30 NOTE — Patient Instructions (Addendum)
COVID-19 Vaccine Information can be found at: [email or web address] For questions related to vaccine distribution or appointments, please email [email or web address] or call [phone].  Covid Vaccine appointment go to [email or web address]   Make sure no other vaccines are given 2 weeks before covid vaccine and 2 weeks after covid vaccine.   A1c: 7.1 today. Therefore, meds are unchanged- Hendricks Limes is now 10 mg a tab.  Continue to get exercise and follow diabetic diet.  Blood pressure is low again today. Take only one tab of the lisinopril-hctz 20-25. Valium is 5 mg, but frequency increased to every 6 hours.  We will call you with lab results.  If you want to start Viagra you will need to get cleared by cardiology first with your heart condition.  Follow up in 3-4 months.

## 2019-12-31 ENCOUNTER — Other Ambulatory Visit: Payer: Self-pay

## 2019-12-31 MED ORDER — BLOOD GLUCOSE MONITOR KIT: PACK | 11 refills | Status: DC

## 2020-01-01 ENCOUNTER — Encounter: Payer: Self-pay | Admitting: Family Medicine

## 2020-01-06 ENCOUNTER — Telehealth: Payer: Self-pay

## 2020-01-06 NOTE — Telephone Encounter (Signed)
Pt left message on nurses VM stating that he had a increase in his valium to 4x daily and the pharmacy is not letting him refill his medication. Pt states the pharmacy is telling him his last refill was 12/01/2019, are putting him on hold for long periods, or he gets disconnected. Pt asking if we can figure out what is going on for him.   [facility]

## 2020-01-07 NOTE — Telephone Encounter (Signed)
Called [facility] and they said last fill date for patient Valium was 12/23/2019 #90 tablets. Next refill can be given 01/11/2020. Pt switched insurance on 02/01, [employer] filled last RX.    Pt got very upset that I told him I had to base my information off what the pharmacy was telling me and medication cannot be filled until this day. Pt said he was switching doctors because he was tired of this and asked for me to send his information. I told him we could send his medical records wherever he needed [place] to send them when he got the new MD.  He was advised records would need to be requested and release signed. Pt was raising voice on the phone throughout phone conversation. Pt was told it was his right to see whatever medical provider he wanted to see and that was his choice.

## 2020-01-12 ENCOUNTER — Ambulatory Visit: Payer: Medicare Other

## 2020-01-12 ENCOUNTER — Ambulatory Visit: Payer: Medicare Other | Attending: Internal Medicine

## 2020-01-12 DIAGNOSIS — Z23 Encounter for immunization: Secondary | ICD-10-CM | POA: Insufficient documentation

## 2020-01-12 NOTE — Progress Notes (Signed)
   Covid-19 Vaccination Clinic  Name:  [name]    MRN: [id number] DOB: [birth year]  01/12/2020  Mr. Schweitzer was observed post Covid-19 immunization for 15 minutes without incidence. He was provided with Vaccine Information Sheet and instruction to access the V-Safe system.   Mr. Wishon was instructed to call 911 with any severe reactions post vaccine: Marland Kitchen Difficulty breathing  . Swelling of your face and throat  . A fast heartbeat  . A bad rash all over your body  . Dizziness and weakness    Immunizations Administered    Name Date Dose VIS Date Route   Pfizer COVID-19 Vaccine 01/12/2020 10:11 AM 0.3 mL 11/08/2019 Intramuscular   Manufacturer: [employer], [employer]   Lot: [id number]   NDC: [address]-1

## 2020-02-03 ENCOUNTER — Other Ambulatory Visit: Payer: Self-pay | Admitting: Cardiology

## 2020-02-03 ENCOUNTER — Telehealth: Payer: Self-pay

## 2020-02-03 DIAGNOSIS — E7800 Pure hypercholesterolemia, unspecified: Secondary | ICD-10-CM

## 2020-02-03 NOTE — Telephone Encounter (Signed)
*  STAT* If patient is at the pharmacy, call can be transferred to refill team.   1. Which medications need to be refilled? (please list name of each medication and dose if known)  rosuvastatin (CRESTOR) 40 MG tablet  2. Which pharmacy/location (including street and city if local pharmacy) is medication to be sent to? [facility] - [place], [place] - [address]  3. Do they need a 30 day or 90 day supply? 90 with refills  Pt has just about two weeks of medication left. He just wants to make sure he doesn't run out

## 2020-02-03 NOTE — Telephone Encounter (Signed)
Pt called and LM asking for a refill on his Crestor 40mg  tablets. Pt was called back and VM was left to call Dr office as this medication is being managed by that office/Cardiology.

## 2020-02-04 ENCOUNTER — Ambulatory Visit: Payer: Medicare Other | Attending: Internal Medicine

## 2020-02-04 DIAGNOSIS — Z23 Encounter for immunization: Secondary | ICD-10-CM | POA: Insufficient documentation

## 2020-02-04 NOTE — Progress Notes (Signed)
   Covid-19 Vaccination Clinic  Name:  [name]    MRN: [id number] DOB: 1956-07-21  02/04/2020  [name] was observed post Covid-19 immunization for 15 minutes without incident. He was provided with Vaccine Information Sheet and instruction to access the V-Safe system.   [name] was instructed to call 911 with any severe reactions post vaccine: Marland Kitchen Difficulty breathing  . Swelling of face and throat  . A fast heartbeat  . A bad rash all over body  . Dizziness and weakness   Immunizations Administered    Name Date Dose VIS Date Route   Pfizer COVID-19 Vaccine 02/04/2020  1:01 PM 0.3 mL 11/08/2019 Intramuscular   Manufacturer: [employer], [employer]   Lot: [id number]   NDC: [address]-0

## 2020-02-06 MED ORDER — ROSUVASTATIN CALCIUM 40 MG PO TABS: 40.0000 mg | ORAL_TABLET | Freq: Every day | ORAL | 3 refills | Status: DC

## 2020-02-06 NOTE — Telephone Encounter (Signed)
Follow Up:   Pt is calling to find out the status of his refill. Pharmacist said they have not received it.

## 2020-02-06 NOTE — Telephone Encounter (Signed)
Refill sent and patient notified 

## 2020-02-13 ENCOUNTER — Other Ambulatory Visit: Payer: Self-pay

## 2020-02-13 ENCOUNTER — Ambulatory Visit (INDEPENDENT_AMBULATORY_CARE_PROVIDER_SITE_OTHER): Payer: Medicare Other | Admitting: Family Medicine

## 2020-02-13 ENCOUNTER — Encounter: Payer: Self-pay | Admitting: Family Medicine

## 2020-02-13 VITALS — BP 116/62 | HR 61 | Temp 98.0°F | Resp 16 | Ht 71.0 in | Wt 206.0 lb

## 2020-02-13 DIAGNOSIS — Z0001 Encounter for general adult medical examination with abnormal findings: Secondary | ICD-10-CM

## 2020-02-13 DIAGNOSIS — E663 Overweight: Secondary | ICD-10-CM

## 2020-02-13 DIAGNOSIS — Z125 Encounter for screening for malignant neoplasm of prostate: Secondary | ICD-10-CM | POA: Diagnosis not present

## 2020-02-13 DIAGNOSIS — I10 Essential (primary) hypertension: Secondary | ICD-10-CM | POA: Diagnosis not present

## 2020-02-13 DIAGNOSIS — L02212 Cutaneous abscess of back [any part, except buttock]: Secondary | ICD-10-CM | POA: Insufficient documentation

## 2020-02-13 DIAGNOSIS — Z0000 Encounter for general adult medical examination without abnormal findings: Secondary | ICD-10-CM | POA: Insufficient documentation

## 2020-02-13 LAB — COMPREHENSIVE METABOLIC PANEL
ALT: 24 U/L (ref 0–53)
AST: 19 U/L (ref 0–37)
Albumin: 4.3 g/dL (ref 3.5–5.2)
Alkaline Phosphatase: 46 U/L (ref 39–117)
BUN: 24 mg/dL — ABNORMAL HIGH (ref 6–23)
CO2: 29 mEq/L (ref 19–32)
Calcium: 9.9 mg/dL (ref 8.4–10.5)
Chloride: 99 mEq/L (ref 96–112)
Creatinine, Ser: 0.9 mg/dL (ref 0.40–1.50)
GFR: 84.54 mL/min (ref >60.00)
Glucose, Bld: 190 mg/dL — ABNORMAL HIGH (ref 70–99)
Potassium: 4.9 mEq/L (ref 3.5–5.1)
Sodium: 134 mEq/L — ABNORMAL LOW (ref 135–145)
Total Bilirubin: 0.3 mg/dL (ref 0.2–1.2)
Total Protein: 7.5 g/dL (ref 6.0–8.3)

## 2020-02-13 LAB — PSA: PSA: 1.02 ng/mL (ref 0.10–4.00)

## 2020-02-13 MED ORDER — OMEGA 3 1000 MG PO CAPS: 1.0000 | ORAL_CAPSULE | Freq: Every day | ORAL | 3 refills | Status: DC

## 2020-02-13 MED ORDER — SULFAMETHOXAZOLE-TRIMETHOPRIM 800-160 MG PO TABS: 1.0000 | ORAL_TABLET | Freq: Two times a day (BID) | ORAL | 0 refills | Status: AC

## 2020-02-13 NOTE — Progress Notes (Signed)
This visit occurred during the SARS-CoV-2 public health emergency.  Safety protocols were in place, including screening questions prior to the visit, additional usage of staff PPE, and extensive cleaning of exam room while observing appropriate contact time as indicated for disinfecting solutions.    Patient ID: [name], male  DOB: [birth year], 64 y.o.   MRN: [id number] Patient Care Team    Relationship Specialty Notifications Start End  Ma Hillock, DO PCP - General Family Medicine  11/29/18   Bernarda Caffey, MD Consulting Physician Ophthalmology  01/09/19     Chief Complaint  Patient presents with  . Annual Exam    Fasting, Pt would like a skin check. He believes he has a spot on his back that he would like checked. He is unable to see it. He needs refill on fish oil to med order pharmacy.     Subjective:  [name] is a 64 y.o. male present for CPE with acute complaints. All past medical history, surgical history, allergies, family history, immunizations, medications and social history were updated in the electronic medical record today. All recent labs, ED visits and hospitalizations within the last year were reviewed.  Skin abscess: Patient reports he has a spot on his back that he noticed last week that is painful.  He states he does have a history of abscess formations.  He reports he thinks it is an abscess that ruptured yesterday because his shirt had blood and drainage on it.  He reports he cannot see the area well given its location and would like for this to be checked today.  He denies fever, chills.  He states that is still draining onto his shirt today.   Health maintenance:  Colonoscopy: cologuard negative 02/24/2019- 3 yr rpt.  Immunizations:  tdap UTD 03/2015, influenza 12/2019, PNA series completed, shingrix 01/2019, covid series completed Infectious disease screening: HIV and  Hep C completed PSA:  Lab Results  Component Value Date   PSA 1.07 02/12/2019     pt was counseled on prostate cancer screenings.  Assistive device: none Oxygen FDV:OUZH Patient has a Dental home. Hospitalizations/ED visits: reviewed  Depression screen [facility] 2/9 02/13/2020 06/04/2019 02/12/2019 11/29/2018  Decreased Interest 0 3 0 3  Down, Depressed, Hopeless _0 PHQ - 2 Score _1 Altered sleeping - 3 0 3  Tired, decreased energy - 3 0 3  Change in appetite - 3 0 3  Feeling bad or failure about yourself  - _2 Trouble concentrating - 0 0 3  Moving slowly or fidgety/restless - 0 0 2  Suicidal thoughts - 0 0 0  PHQ-9 Score - _3 Difficult doing work/chores - Very difficult Not difficult at all -   GAD 7 : Generalized Anxiety Score 04/24/2019 02/12/2019 12/31/2018 11/29/2018  Nervous, Anxious, on Edge _4 Control/stop worrying 0 0 2 3  Worry too much - different things 0 0 2 3  Trouble relaxing 2 0 2 3  Restless _5 Easily annoyed or irritable 0 0 2 0  Afraid - awful might happen _6 Total GAD 7 Score _7 Anxiety Difficulty Somewhat difficult Not difficult at all - -     Fall Risk  06/04/2019 11/29/2018  Falls in the past year? - 0  Number falls in past yr: 1 0  Injury with Fall? 0 0  Risk for fall due to : Impaired balance/gait;Impaired mobility -   Immunization History  Administered Date(s) Administered  . Fluad Quad(high Dose 65+) 12/30/2019  . Influenza,inj,Quad PF,6+ Mos 11/29/2018  . Influenza,inj,quad, With Preservative 08/30/2017  . Influenza-Unspecified 11/28/2016  . PFIZER SARS-COV-2 Vaccination 01/12/2020, 02/04/2020  . Pneumococcal Conjugate-13 02/12/2019  . Pneumococcal Polysaccharide-23 03/28/2014, 12/30/2019  . Tdap 04/16/2015  . Zoster Recombinat (Shingrix) 02/12/2019     Past Medical History:  Diagnosis Date  . Alcohol addiction ([place])   . Anxiety   . Arthritis   . Bulging of lumbar intervertebral disc 2017   MRI: L3-4, L4-5 and L5-S1- facet hypertrophy and formainal stenosis  . CAD (coronary artery  disease)   . Coronary artery disease with hx of myocardial infarct w/o hx of CABG 2004  . Depression   . Dextrocardia   . Diabetes ([place])   . Heart attack [facility]) 2004   Stent placement 2008  . Hyperlipidemia   . Hypertension   . Insomnia    Allergies  Allergen Reactions  . Gabapentin Other (See Comments)    Paranoia/hallucinations  . Hydrocodone Other (See Comments)    PT reports  Confusion when tacking Hydrocodone  . Nsaids     History of heart disease, CAD with history of MI, CABG status post stent  . Prednisone     Paranoia  . Tramadol   . Penicillins Other (See Comments)    Childhood unsure if still allgeric   Past Surgical History:  Procedure Laterality Date  . CORONARY ANGIOPLASTY WITH STENT PLACEMENT  2008   Family History  Problem Relation Age of Onset  . Heart attack Mother   . Heart disease Mother   . Diabetes Mother   . Arthritis Mother   . Hyperlipidemia Mother   . Hypertension Mother   . Breast cancer Mother   . Heart attack Father   . Heart disease Father   . Alcohol abuse Father   . Early death Father   . Arthritis Sister   . Heart disease Sister   . Hyperlipidemia Sister   . Asthma Sister   . COPD Sister   . Diabetes Sister   . 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 If you have any  questions, ask your nurse or doctor.        Accu-Chek FastClix Lancets Misc   Accu-Chek Guide test strip Generic drug: glucose blood Use to check sugars up to three times daily DX11.9   aspirin EC 81 MG tablet Take 1 tablet (81 mg total) by mouth daily.   blood glucose meter kit and supplies Kit Dispense based on patient and insurance preference. Use up to  four times daily as directed. DX E11.9   diazepam 5 MG tablet Commonly known as: VALIUM Take 1 tablet (5 mg total) by mouth every 6 (six) hours as needed for anxiety.   Farxiga 10 MG Tabs tablet Generic drug: dapagliflozin propanediol Take 10 mg by mouth daily before breakfast.   glipiZIDE 10 MG tablet Commonly known as: GLUCOTROL TAKE  (1)  TABLET TWICE A DAY.   lisinopril-hydrochlorothiazide 20-25 MG tablet Commonly known as: ZESTORETIC Take 1 tablet by mouth daily.   metoprolol succinate 25 MG 24 hr tablet Commonly known as: TOPROL-XL Take 1 tablet (25 mg total) by mouth daily.   naproxen 500 MG tablet Commonly known as: NAPROSYN Every 12 hours with food as needed only   nitroGLYCERIN 0.4 MG SL tablet Commonly known as: NITROSTAT Place 1 tablet (0.4 mg total) under the tongue every 5 (five) minutes as needed for chest pain.   Omega 3 1000 MG Caps Take 1 capsule (1,000 mg total) by mouth daily.   rosuvastatin 40 MG tablet Commonly known as: CRESTOR Take 1 tablet (40 mg total) by mouth daily.   SitaGLIPtin-MetFORMIN HCl [phone] MG Tb24 Take 1 tablet by mouth daily.   sulfamethoxazole-trimethoprim 800-160 MG tablet Commonly known as: BACTRIM DS Take 1 tablet by mouth 2 (two) times daily for 5 days.   traZODone 50 MG tablet Commonly known as: DESYREL Take 1 tablet (50 mg total) by mouth at bedtime as needed for sleep.      All past medical history, surgical history, allergies, family history, immunizations andmedications were updated in the EMR today and reviewed under the history and medication  portions of their EMR.      No results found.  ROS: 14 pt review of systems performed and negative (unless mentioned in an HPI)  Objective: BP 116/62 (BP Location: Right Arm, Patient Position: Sitting, Cuff Size: Normal)   Pulse 61   Temp 98 F (36.7 C) (Temporal)   Resp 16   Ht _0  (1.803 m)   Wt 206 lb (93.4 kg)   SpO2 95%   BMI 28.73 kg/m  Gen: Afebrile. No acute distress. Nontoxic in appearance, well-developed, well-nourished, overweight Caucasian male. HENT: AT. [place]. Bilateral TM visualized and normal in appearance, normal external auditory canal. MMM, no oral lesions, adequate dentition. Bilateral nares within normal limits. Throat without erythema, ulcerations or exudates.  No cough on exam, no hoarseness on exam. Eyes:Pupils Equal Round Reactive to light, Extraocular movements intact,  Conjunctiva without redness, discharge or icterus. Neck/lymp/endocrine: Supple, no lymphadenopathy, no thyromegaly CV: RRR no murmur, no edema, +2/4 P posterior tibialis pulses.  No carotid bruits. No JVD. Chest: CTAB, no wheeze, rhonchi or crackles.  Normal respiratory effort.  Good air movement. Abd: Soft.  Flat. NTND. BS present.  No masses palpated. No hepatosplenomegaly. No rebound tenderness or guarding. Skin: Ruptured abscess mid right back with puslike drainage.  No bleeding.  Erythema surrounding approximately 1/2 cm abscess.  No fluctuance.  No rashes, purpura or petechiae. Warm and well-perfused.  Neuro/Msk:  Normal gait. PERLA. EOMi. Alert. Oriented x3.  Cranial nerves II through XII intact. Muscle strength 5/5 upper/lower extremity. DTRs equal bilaterally. Psych: Normal affect, dress and demeanor. Normal speech. Normal thought content and judgment.   Assessment/plan: [name] is a 64 y.o. male present for CPE Prostate cancer screening - PSA Essential hypertension/Overweight (BMI 25.0-29.9) - Comprehensive metabolic panel - stable. Continue current regimen and routine  follows per last OV schedule.  Abscess of back Proximately  1/2 cm.  Abscess right mid back with swelling and mild cellulitis surrounding.  Treated with Bactrim twice daily x5 days.  Patient to follow-up if symptoms are not improving or if develop fever or increased pain at location. Encounter for general adult medical examination with abnormal findings Patient was encouraged to exercise greater than 150 minutes a week. Patient was encouraged to choose a diet filled with fresh fruits and vegetables, and lean meats. AVS provided to patient today for education/recommendation on gender specific health and safety maintenance. Colonoscopy: cologuard negative 02/24/2019- 3 yr rpt.  Immunizations:  tdap UTD 03/2015, influenza 12/2019, PNA series completed, shingrix 01/2019, covid series completed Infectious disease screening: HIV and  Hep C completed  -Patient was encouraged to have his Shingrix No. 2 completed in approximately 3-4 weeks by nurse visit.  He will call to schedule-he states he has a imaging study he needs to complete around that time also I will schedule for same day.  Return in about 1 year (around 02/12/2021) for CPE (30 min) .  Patient to continue following routine schedule from last office visit on his chronic medical conditions.  Orders Placed This Encounter  Procedures  . Comprehensive metabolic panel  . PSA   Orders Placed This Encounter  Procedures  . Comprehensive metabolic panel  . PSA   Meds ordered this encounter  Medications  . sulfamethoxazole-trimethoprim (BACTRIM DS) 800-160 MG tablet    Sig: Take 1 tablet by mouth 2 (two) times daily for 5 days.    Dispense:  10 tablet    Refill:  0  . Omega 3 1000 MG CAPS    Sig: Take 1 capsule (1,000 mg total) by mouth daily.    Dispense:  90 capsule    Refill:  3   Referral Orders  No referral(s) requested today     Note is dictated utilizing voice recognition software. Although note has been proof read prior to signing,  occasional typographical errors still can be missed. If any questions arise, please do not hesitate to call for verification.  Electronically signed by: Howard Pouch, DO [place]

## 2020-02-13 NOTE — Patient Instructions (Addendum)
You should have your 2nd shingrix- but you should wait until at lest  4 weeks after your covid vaccine.  Health Maintenance, Male Adopting a healthy lifestyle and getting preventive care are important in promoting health and wellness. Ask your health care provider about:  The right schedule for you to have regular tests and exams.  Things you can do on your own to prevent diseases and keep yourself healthy. What should I know about diet, weight, and exercise? Eat a healthy diet   Eat a diet that includes plenty of vegetables, fruits, low-fat dairy products, and lean protein.  Do not eat a lot of foods that are high in solid fats, added sugars, or sodium. Maintain a healthy weight Body mass index (BMI) is a measurement that can be used to identify possible weight problems. It estimates body fat based on height and weight. Your health care provider can help determine your BMI and help you achieve or maintain a healthy weight. Get regular exercise Get regular exercise. This is one of the most important things you can do for your health. Most adults should:  Exercise for at least 150 minutes each week. The exercise should increase your heart rate and make you sweat (moderate-intensity exercise).  Do strengthening exercises at least twice a week. This is in addition to the moderate-intensity exercise.  Spend less time sitting. Even light physical activity can be beneficial. Watch cholesterol and blood lipids Have your blood tested for lipids and cholesterol at 66 years of age, then have this test every 5 years. You may need to have your cholesterol levels checked more often if:  Your lipid or cholesterol levels are high.  You are older than 66 years of age.  You are at high risk for heart disease. What should I know about cancer screening? Many types of cancers can be detected early and may often be prevented. Depending on your health history and family history, you may need to have  cancer screening at various ages. This may include screening for:  Colorectal cancer.  Prostate cancer.  Skin cancer.  Lung cancer. What should I know about heart disease, diabetes, and high blood pressure? Blood pressure and heart disease  High blood pressure causes heart disease and increases the risk of stroke. This is more likely to develop in people who have high blood pressure readings, are of African descent, or are overweight.  Talk with your health care provider about your target blood pressure readings.  Have your blood pressure checked: ? Every 3-5 years if you are 55-80 years of age. ? Every year if you are 52 years old or older.  If you are between the ages of 83 and 74 and are a current or former smoker, ask your health care provider if you should have a one-time screening for abdominal aortic aneurysm (AAA). Diabetes Have regular diabetes screenings. This checks your fasting blood sugar level. Have the screening done:  Once every three years after age 71 if you are at a normal weight and have a low risk for diabetes.  More often and at a younger age if you are overweight or have a high risk for diabetes. What should I know about preventing infection? Hepatitis B If you have a higher risk for hepatitis B, you should be screened for this virus. Talk with your health care provider to find out if you are at risk for hepatitis B infection. Hepatitis C Blood testing is recommended for:  Everyone born from [birth year] through  44.  Anyone with known risk factors for hepatitis C. Sexually transmitted infections (STIs)  You should be screened each year for STIs, including gonorrhea and chlamydia, if: ? You are sexually active and are younger than 66 years of age. ? You are older than 66 years of age and your health care provider tells you that you are at risk for this type of infection. ? Your sexual activity has changed since you were last screened, and you are at increased  risk for chlamydia or gonorrhea. Ask your health care provider if you are at risk.  Ask your health care provider about whether you are at high risk for HIV. Your health care provider may recommend a prescription medicine to help prevent HIV infection. If you choose to take medicine to prevent HIV, you should first get tested for HIV. You should then be tested every 3 months for as long as you are taking the medicine. Follow these instructions at home: Lifestyle  Do not use any products that contain nicotine or tobacco, such as cigarettes, e-cigarettes, and chewing tobacco. If you need help quitting, ask your health care provider.  Do not use street drugs.  Do not share needles.  Ask your health care provider for help if you need support or information about quitting drugs. Alcohol use  Do not drink alcohol if your health care provider tells you not to drink.  If you drink alcohol: ? Limit how much you have to 0-2 drinks a day. ? Be aware of how much alcohol is in your drink. In the [place] one drink equals one 12 oz bottle of beer (355 mL), one 5 oz glass of wine (148 mL), or one 1 oz glass of hard liquor (44 mL). General instructions  Schedule regular health, dental, and eye exams.  Stay current with your vaccines.  Tell your health care provider if: ? You often feel depressed. ? You have ever been abused or do not feel safe at home. Summary  Adopting a healthy lifestyle and getting preventive care are important in promoting health and wellness.  Follow your health care provider's instructions about healthy diet, exercising, and getting tested or screened for diseases.  Follow your health care provider's instructions on monitoring your cholesterol and blood pressure. This information is not intended to replace advice given to you by your health care provider. Make sure you discuss any questions you have with your health care provider. Document Revised: 11/07/2018 Document  Reviewed: 11/07/2018 Elsevier Patient Education  2020 [employer].

## 2020-02-14 ENCOUNTER — Encounter: Payer: PRIVATE HEALTH INSURANCE | Admitting: Family Medicine

## 2020-03-06 ENCOUNTER — Ambulatory Visit (HOSPITAL_COMMUNITY): Payer: Medicare Other | Attending: Cardiology

## 2020-03-06 ENCOUNTER — Other Ambulatory Visit: Payer: Self-pay

## 2020-03-06 DIAGNOSIS — I350 Nonrheumatic aortic (valve) stenosis: Secondary | ICD-10-CM | POA: Insufficient documentation

## 2020-04-17 ENCOUNTER — Other Ambulatory Visit: Payer: Self-pay | Admitting: Family Medicine

## 2020-04-17 DIAGNOSIS — I10 Essential (primary) hypertension: Secondary | ICD-10-CM

## 2020-04-22 ENCOUNTER — Other Ambulatory Visit: Payer: Self-pay

## 2020-04-22 NOTE — Telephone Encounter (Signed)
Pt left VM on nurse line stating we denied has refill on his medications and he wanted to know why and if he needed appt for refills. After reviewing chart a refill request was sent for pts two BP medications, Metoprolol and Lisinopril. I called [facility] and they said pt had 90 day supply sent to him the middle of April 2021. Pt will need appt before meds run out but is due for his DM appt.  Tried to contact pt and went to VM. Will try again later.

## 2020-04-22 NOTE — Telephone Encounter (Signed)
Patient called back regarding.  Patient stated that Dr. Claiborne Billings had him take 2 blood pressure meds when his blood pressure was running high.  If he needs an appt to see Dr. Claiborne Billings, he can see her after 04/30/20, that's when he will have a way here.   Please call him tomorrow. [phone].

## 2020-04-23 NOTE — Telephone Encounter (Signed)
Pt was called and stated that he was told to reduce his BP med to once daily, Lisinopril/HCTZ, due to BP being low. He did that for several weeks and BP was running 150's/80's and not coming down. Pt started taking two tablets of the Lisinopril/HCTZ and now it is running 120's/70's with no low readings. He is checking BP daily. Pt has two weeks worth of meds left and [facility] has no refills left since he has been taking two tablets. He knows he is due for his [facility] appt next month but will need enough to last him. Pt advised Dr Claiborne Billings is out of the office but note would be sent to PCP and we would contact him ASAP.

## 2020-05-02 ENCOUNTER — Other Ambulatory Visit: Payer: Self-pay | Admitting: Family Medicine

## 2020-05-04 ENCOUNTER — Other Ambulatory Visit: Payer: Self-pay | Admitting: Family Medicine

## 2020-05-04 DIAGNOSIS — E118 Type 2 diabetes mellitus with unspecified complications: Secondary | ICD-10-CM

## 2020-05-04 NOTE — Telephone Encounter (Signed)
[facility] was called last shipment of pts BP was sent 03/04/2020 for a 90 day supply and he has no refills left. Pt was called and told he should have one more months worth of medications left. Pt proceeded to raise voice and said "are you stupid or something? I have called you about this before and you must just be stupid. My blood pressure runs different at home than at yalls office and I have to take more medication" I explained to patient that he cannot take BP meds like he wants and that he needs to come for a visit or call. I was trying to explain to pt that insurance will not pay for this medication to take as he wants and it is not safe. Pt began yelling again saying "I shouldn't have to explain myself again if yall are too stupid to get this". Pt advised I would schedule him an appt and he could discuss with Dr Claiborne Billings but he will not talk to me that way and we would need to disconnect the call. Appt was scheduled for this Wednesday. Pt has ten pills left.   Sent to Dr Claiborne Billings

## 2020-05-04 NOTE — Telephone Encounter (Signed)
Patient called and states that he has spoke with [facility] and they have told him they are no refills on the prescription   lisinopril-hydrochlorothiazide (ZESTORETIC) 20-25 MG tablet [[id number]]    He has #10 pills left.  He will be out.  Please call him today and advise what today to do from here on out. [phone].

## 2020-05-04 NOTE — Telephone Encounter (Signed)
noted 

## 2020-05-05 ENCOUNTER — Telehealth: Payer: Self-pay

## 2020-05-05 NOTE — Telephone Encounter (Signed)
Client [facility] Day - Client Client Site [facility] - Day Physician Claiborne Billings, [place] Contact Type Call Who Is Calling Patient / Member / Family / Caregiver Caller Name [name] Caller Phone Number [phone] or [phone] Patient Name [name] Patient DOB 02-04-1956 Call Type Message Only Information Provided Reason for Call Request to [facility] Appointment Initial Comment Caller wants to cancel appt 05/06/20@ 2pm Disp. Time Disposition Final User 05/04/2020 7:40:26 PM General Information Provided Yes Dyke Brackett Call Closed By: Dyke Brackett Transaction Date/Time: 05/04/2020 7:37:25 PM (ET)

## 2020-05-06 ENCOUNTER — Ambulatory Visit: Payer: Medicare Other | Admitting: Family Medicine

## 2020-05-11 ENCOUNTER — Other Ambulatory Visit: Payer: Self-pay

## 2020-05-11 ENCOUNTER — Ambulatory Visit (INDEPENDENT_AMBULATORY_CARE_PROVIDER_SITE_OTHER): Payer: Medicare Other | Admitting: Family Medicine

## 2020-05-11 VITALS — BP 102/62 | HR 58 | Temp 98.2°F | Resp 12 | Ht 71.0 in | Wt 206.2 lb

## 2020-05-11 DIAGNOSIS — I252 Old myocardial infarction: Secondary | ICD-10-CM

## 2020-05-11 DIAGNOSIS — I10 Essential (primary) hypertension: Secondary | ICD-10-CM

## 2020-05-11 DIAGNOSIS — E118 Type 2 diabetes mellitus with unspecified complications: Secondary | ICD-10-CM | POA: Diagnosis not present

## 2020-05-11 DIAGNOSIS — E119 Type 2 diabetes mellitus without complications: Secondary | ICD-10-CM | POA: Diagnosis not present

## 2020-05-11 DIAGNOSIS — E1142 Type 2 diabetes mellitus with diabetic polyneuropathy: Secondary | ICD-10-CM

## 2020-05-11 DIAGNOSIS — G4700 Insomnia, unspecified: Secondary | ICD-10-CM

## 2020-05-11 DIAGNOSIS — Z955 Presence of coronary angioplasty implant and graft: Secondary | ICD-10-CM

## 2020-05-11 DIAGNOSIS — F411 Generalized anxiety disorder: Secondary | ICD-10-CM

## 2020-05-11 DIAGNOSIS — E113293 Type 2 diabetes mellitus with mild nonproliferative diabetic retinopathy without macular edema, bilateral: Secondary | ICD-10-CM | POA: Diagnosis not present

## 2020-05-11 DIAGNOSIS — I2510 Atherosclerotic heart disease of native coronary artery without angina pectoris: Secondary | ICD-10-CM

## 2020-05-11 DIAGNOSIS — R011 Cardiac murmur, unspecified: Secondary | ICD-10-CM

## 2020-05-11 DIAGNOSIS — E7800 Pure hypercholesterolemia, unspecified: Secondary | ICD-10-CM

## 2020-05-11 LAB — POCT GLYCOSYLATED HEMOGLOBIN (HGB A1C)
HbA1c POC (<> result, manual entry): 7.6 % (ref 4.0–5.6)
HbA1c, POC (controlled diabetic range): 7.6 % — AB (ref 0.0–7.0)
HbA1c, POC (prediabetic range): 7.6 % — AB (ref 5.7–6.4)
Hemoglobin A1C: 7.6 % — AB (ref 4.0–5.6)

## 2020-05-11 MED ORDER — TRAZODONE HCL 50 MG PO TABS: 50.0000 mg | ORAL_TABLET | Freq: Every evening | ORAL | 1 refills | Status: DC | PRN

## 2020-05-11 MED ORDER — LISINOPRIL-HYDROCHLOROTHIAZIDE 20-25 MG PO TABS: 1.0000 | ORAL_TABLET | Freq: Every day | ORAL | 1 refills | Status: DC

## 2020-05-11 MED ORDER — SITAGLIP PHOS-METFORMIN HCL ER 100-1000 MG PO TB24: 1.0000 | ORAL_TABLET | Freq: Every day | ORAL | 1 refills | Status: DC

## 2020-05-11 MED ORDER — GLIPIZIDE 10 MG PO TABS: ORAL_TABLET | ORAL | 1 refills | Status: DC

## 2020-05-11 MED ORDER — METOPROLOL SUCCINATE ER 25 MG PO TB24: 25.0000 mg | ORAL_TABLET | Freq: Every day | ORAL | 1 refills | Status: DC

## 2020-05-11 MED ORDER — DIAZEPAM 5 MG PO TABS: 5.0000 mg | ORAL_TABLET | Freq: Four times a day (QID) | ORAL | 5 refills | Status: DC | PRN

## 2020-05-11 MED ORDER — DAPAGLIFLOZIN PROPANEDIOL 10 MG PO TABS: 10.0000 mg | ORAL_TABLET | Freq: Every day | ORAL | 1 refills | Status: DC

## 2020-05-11 NOTE — Patient Instructions (Signed)
A1c is 7.6 today. A little higher than your usual. We will keep meds the same for now since you are able to exercise again.   I have refilled all the medications I provide you for 6 months.  You will need to follow every 4 months for your diabetes.

## 2020-05-11 NOTE — Progress Notes (Signed)
[name] , 02/27/1956, 64 y.o., male MRN: [id number] Patient Care Team    Relationship Specialty Notifications Start End  Ma Hillock, DO PCP - General Family Medicine  11/29/18   Bernarda Caffey, MD Consulting Physician Ophthalmology  01/09/19     Chief Complaint  Patient presents with  . Hypertension    lisinopril  . Chest Pain    stated he can call cardiologist but if you can address if time allows  . Neck Pain    been going on for a while, but pain is increasing  . Diabetes    would like a1c checked  . Medication Refill    diazepam     Subjective: [name] is a 64 y.o. male present today for [facility]  Depression/anxiety/insomnia: Patient reports he is doing well on Valium 4 times daily.  [place] controlled substance database reviewed. Original note: He states he has a rather routine day. He does not have much social contact in a day. He lives by himself. He reports that he always feels like he "stays on edge. "He reports when he wakes up he is already feeling on edge and he will take a Valium at that time. He then has breakfast and goes on a 4 mile walk daily. For lunch he starts to feel his anxiety sudden and he becomes panicked. He states when he feels this way he will break out in a sweat, become very fearful and scared. He states he commonly fears there is someone in the house with a gun. He goes to his bedroom gets in his bed.He states the fear is so intense and he just cannot control it. He denies any known trauma or event experienced in his life that would create such a fear. He states he did go see a counselor/psychiatrist at [facility] in the past6 months, and he did not feel that was helpful for him. Denies any SI or HI.  Diabetes/overweight: Patient reports he has been a diabetic since 1998.  He reports compliance with Janumet/Metformin 1000-100 mg daily, glipizide 10 mg twice daily, Farxiga 10 mg daily. Patient denies dizziness, hyperglycemic  or hypoglycemic events. Patient denies numbness, tingling in the extremities or nonhealing wounds of feet.  PNA series:Pleated after 63 Flu shot: completed 12/30/2019.  BMP: Up-to-date 01/2020 Foot exam:Completed 11/29/2018-referral to podiatry.  Eye exam: Completed 01/08/2019-[facility]>>encouraged to schedule.  A1c:10.4>>>9.4>>  9.7>>11.8>> 8.5 >>> 6.5 >>>7.1 today.  Hypertension/CAD/S/p Stent/prior MI: Pt has a significant cardiac history of "anterior MI in 2004 with stents was LAD and then perhaps stents to branch vessel 2008(by DR. Renaldo-cardio note 2015)" Pt was having hypotensive symptoms with hyponatremia.  He was told to decrease his lisinopril HCTZ to 1 tablet daily.  He admits today he did not remember to do that.  Therefore he is still taking lisinopril 40-HCTZ 50.  He is compliant with baby aspirin 81 mg daily.  Plavix was DC'd by cards.  His statin was increased by cardiology to Crestor 40 mg daily.  Patient reports compliance with metoprolol 25 mg daily Labs collected today Diet:Low-sodium Exercise:Routine exercise daily GU:[id number], hyperlipidemia, diabetes, overweight, family history,CAD, MI  Neck pain/back pain: Patient states he rarely needs to use naproxen but when he does have a headache and neck pain, it is effective. Prior note: Patient has had recurrent posterior neck pain and headache.  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  He states he found  an old prescription of naproxen and restarted it the last time he had symptoms.  He states it worked well for him.  He is wondering if we could get refills for him today.  In the past he has stated he could not tolerate NSAIDs.    Erectile dysfunction: Patient would like to start a medication for erectile dysfunction.  He states he seen a urologist many years ago and does not desire to see a urologist again.  Patient has a significant medical history of blood pressure with  systolic less than 354 and history of CAD and MI  Depression screen [facility] 2/9 02/13/2020 06/04/2019 02/12/2019 11/29/2018  Decreased Interest 0 3 0 3  Down, Depressed, Hopeless _0 PHQ - 2 Score _1 Altered sleeping - 3 0 3  Tired, decreased energy - 3 0 3  Change in appetite - 3 0 3  Feeling bad or failure about yourself  - _2 Trouble concentrating - 0 0 3  Moving slowly or fidgety/restless - 0 0 2  Suicidal thoughts - 0 0 0  PHQ-9 Score - _3 Difficult doing work/chores - Very difficult Not difficult at all -    Allergies  Allergen Reactions  . Gabapentin Other (See Comments)    Paranoia/hallucinations  . Hydrocodone Other (See Comments)    PT reports  Confusion when tacking Hydrocodone  . Nsaids     History of heart disease, CAD with history of MI, CABG status post stent  . Prednisone     Paranoia  . Tramadol   . 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 Alcohol addiction ([place])   . Anxiety   . Arthritis   . Bulging of lumbar intervertebral disc 2017   MRI: L3-4, L4-5 and L5-S1- facet hypertrophy and formainal stenosis  . CAD (coronary artery disease)   . Coronary artery disease with hx of myocardial infarct w/o hx of CABG 2004  . Depression   . Dextrocardia   . Diabetes ([place])   . Heart attack [facility]) 2004   Stent placement 2008  . Hyperlipidemia   . Hypertension   . Insomnia    Past Surgical History:  Procedure Laterality Date  . CORONARY ANGIOPLASTY WITH STENT PLACEMENT  2008   Family History  Problem Relation Age of Onset  . Heart attack Mother   . Heart disease Mother   . Diabetes Mother   . Arthritis Mother   . Hyperlipidemia Mother   . Hypertension  Mother   . Breast cancer Mother   . Heart attack Father   . Heart disease Father   . Alcohol abuse Father   . Early death Father   . Arthritis Sister   . Heart disease Sister   . Hyperlipidemia Sister   . Asthma Sister   . COPD Sister   . Diabetes Sister   . Alcohol abuse Sister    Allergies as of 05/11/2020      Reactions   Gabapentin Other (See Comments)   Paranoia/hallucinations   Hydrocodone Other (See Comments)   PT reports  Confusion when tacking Hydrocodone  Nsaids    History of heart disease, CAD with history of MI, CABG status post stent   Prednisone    Paranoia   Tramadol    Penicillins Other (See Comments)   Childhood unsure if still allgeric      Medication List       Accurate as of May 11, 2020 11:59 PM. If you have any questions, ask your nurse or doctor.        STOP taking these medications   Omega 3 1000 MG Caps Stopped by: Howard Pouch, DO     TAKE these medications   Accu-Chek FastClix Lancets Misc   Accu-Chek Guide test strip Generic drug: glucose blood Use to check sugars up to three times daily DX11.9   aspirin EC 81 MG tablet Take 1 tablet (81 mg total) by mouth daily.   blood glucose meter kit and supplies Kit Dispense based on patient and insurance preference. Use up to four times daily as directed. DX E11.9   dapagliflozin propanediol 10 MG Tabs tablet Commonly known as: Farxiga Take 1 tablet (10 mg total) by mouth daily before breakfast.   diazepam 5 MG tablet Commonly known as: VALIUM Take 1 tablet (5 mg total) by mouth every 6 (six) hours as needed for anxiety.   glipiZIDE 10 MG tablet Commonly known as: GLUCOTROL TAKE  (1)  TABLET TWICE A DAY.   lisinopril-hydrochlorothiazide 20-25 MG tablet Commonly known as: ZESTORETIC Take 1 tablet by mouth daily.   metoprolol succinate 25 MG 24 hr tablet Commonly known as: TOPROL-XL Take 1 tablet (25 mg total) by mouth daily.   naproxen 500 MG tablet Commonly known as:  NAPROSYN Every 12 hours with food as needed only   nitroGLYCERIN 0.4 MG SL tablet Commonly known as: NITROSTAT Place 1 tablet (0.4 mg total) under the tongue every 5 (five) minutes as needed for chest pain.   omega-3 acid ethyl esters 1 g capsule Commonly known as: LOVAZA Take 1 capsule by mouth at bedtime.   rosuvastatin 40 MG tablet Commonly known as: CRESTOR Take 1 tablet (40 mg total) by mouth daily.   SitaGLIPtin-MetFORMIN HCl [phone] MG Tb24 Take 1 tablet by mouth daily.   traZODone 50 MG tablet Commonly known as: DESYREL Take 1 tablet (50 mg total) by mouth at bedtime as needed for sleep.       All past medical history, surgical history, allergies, family history, immunizations andmedications were updated in the EMR today and reviewed under the history and medication portions of their EMR.     ROS: Negative, with the exception of above mentioned in HPI   Objective:  BP 102/62 (BP Location: Left Arm, Cuff Size: Normal)   Pulse (!) 58   Temp 98.2 F (36.8 C) (Temporal)   Resp 12   Ht _0  (1.803 m)   Wt 206 lb 3.2 oz (93.5 kg)   SpO2 97%   BMI 28.76 kg/m  Body mass index is 28.76 kg/m. Gen: Afebrile. No acute distress.  Nontoxic male. HENT: AT. [place].  No cough.  No hoarseness. Eyes:Pupils Equal Round Reactive to light, Extraocular movements intact,  Conjunctiva without redness, discharge or icterus. Neck/lymp/endocrine: Supple, no lymphadenopathy, no thyromegaly CV: RRR no murmur, no edema, +2/4 P posterior tibialis pulses Chest: CTAB, no wheeze or crackles Skin: No rashes, purpura or petechiae.  Neuro:  Normal gait. PERLA. EOMi. Alert. Oriented x3  Psych: Argumentative.  Normal affect, dress and demeanor. Normal speech. Normal thought content and judgment.  No exam  data present No results found. No results found for this or any previous visit (from the past 24 hour(s)).  Assessment/Plan: [name] is a 64 y.o. male present for OV for    Depression/anxiety/insomnia: - Has a history of a rather significant depression with anxiety with paranoid features.    - Improved greatly with start of paxil.>>   However patient discontinued on his home, but reports he is still doing well. -Continue trazodone 50 mg nightly. -Continue Valium 5 mg TID to 4 times daily as needed.He is aware it is a controlled substance.   - [place] controlled substance database reviewed 05/14/20.  Patient aware he needs a face-to-face appointment in order for any refills to be processed -Contract signed -Offered referral to counseling or psychiatry;he declined. Follow-up required every 5.5 months  Diabetes, uncontrolled/diabetic peripheral neuropathy/mild nonproliferative diabetic retinopathy of both eyes/overweight: Mildly increased today however patient has not been able to exercise.  He states he is getting back to exercising now and thinks he can get his A1c back down without changing his medications. -Continue Janumet [phone] daily - Continue glipizide 10 mg BID.     -Continue farixga 10 mg daily PNA series:Plate after 63 Flu shot: completed 12/30/2019.  BMP: Up-to-date 01/2020 Foot exam:Completed  Eye exam: Completed 01/08/2019-[facility]>>encouraged to schedule.  A1c:10.4>>>9.4>>  9.7>>11.8>> 8.5 >>> 6.5 >>>7.1>> 7.6 today. Follow-up 3 months.  Hypertension/CAD/S/p Stent/prior MI/hypercholesteremia:Pt has a significant cardiac history of "anterior MI in 2004 with stents was LAD and then stents to a branch vessel 2008(by DR. Renaldo- cardio note 2015)" -Patient with low end of normal blood pressure today (with taking lisinopril appropriately) and history of hyponatremia.  Despite lengthy conversation with him on prior office visits he still occasionally will take an additional lisinopril if he feels his blood pressure is elevated, which is usually confused for his anxiety.  He has been symptomatic with dizziness and hyponatremia  secondary to overuse of medication.  He has been counseled on multiple occasions> we will only supply enough blood pressure medication to take as directed and he is directed to only take one lisinopril-HCTZ tabs daily.  He gets argumentative and angry when he runs out of his medications early and his pharmacy does not refill them when he wants them-because it is too early for them to do so. -Continue lisinopril-HCTZ 20-25 mg daily -Continue metoprolol 25 mg daily -Continue aspirin 81 mg daily. Cardiology told him he no longer needed the  Plavix 75 mg daily -Continue Crestor 40 mg daily> supplied by cardiology Diet:Low-sodium Exercise:Routine exercise daily - now established with cardiology as well for routine follow up.     Reviewed expectations re: course of current medical issues.  Discussed self-management of symptoms.  Outlined signs and symptoms indicating need for more acute intervention.  Patient verbalized understanding and all questions were answered.  Patient received an After-Visit Summary.  Follow-up on chronic conditions every 3-4 months.  Any future calls from Mr. Phillippi in which he is verbally abusive or rude to myself or staff, for any reason, he will be dismissed from our practice.  Patient is aware this will not be tolerated any further.  Orders Placed This Encounter  Procedures  . POCT glycosylated hemoglobin (Hb A1C)   Meds ordered this encounter  Medications  . SitaGLIPtin-MetFORMIN HCl [phone] MG TB24    Sig: Take 1 tablet by mouth daily.    Dispense:  90 tablet    Refill:  1  . metoprolol succinate (TOPROL-XL) 25 MG 24  hr tablet    Sig: Take 1 tablet (25 mg total) by mouth daily.    Dispense:  90 tablet    Refill:  1  . glipiZIDE (GLUCOTROL) 10 MG tablet    Sig: TAKE  (1)  TABLET TWICE A DAY.    Dispense:  180 tablet    Refill:  1  . dapagliflozin propanediol (FARXIGA) 10 MG TABS tablet    Sig: Take 1 tablet (10 mg total) by mouth daily before  breakfast.    Dispense:  90 tablet    Refill:  1  . traZODone (DESYREL) 50 MG tablet    Sig: Take 1 tablet (50 mg total) by mouth at bedtime as needed for sleep.    Dispense:  90 tablet    Refill:  1  . lisinopril-hydrochlorothiazide (ZESTORETIC) 20-25 MG tablet    Sig: Take 1 tablet by mouth daily.    Dispense:  90 tablet    Refill:  1  . diazepam (VALIUM) 5 MG tablet    Sig: Take 1 tablet (5 mg total) by mouth every 6 (six) hours as needed for anxiety.    Dispense:  120 tablet    Refill:  5   Referral Orders  No referral(s) requested today     Note is dictated utilizing voice recognition software. Although note has been proof read prior to signing, occasional typographical errors still can be missed. If any questions arise, please do not hesitate to call for verification.   electronically signed by:  Howard Pouch, DO  [place]

## 2020-05-14 ENCOUNTER — Encounter: Payer: Self-pay | Admitting: Family Medicine

## 2020-06-18 ENCOUNTER — Other Ambulatory Visit: Payer: Self-pay

## 2020-06-18 ENCOUNTER — Encounter: Payer: Self-pay | Admitting: Family Medicine

## 2020-06-18 ENCOUNTER — Ambulatory Visit (INDEPENDENT_AMBULATORY_CARE_PROVIDER_SITE_OTHER): Payer: Medicare Other | Admitting: Family Medicine

## 2020-06-18 VITALS — BP 104/62 | HR 58 | Temp 98.6°F | Ht 71.0 in | Wt 204.0 lb

## 2020-06-18 DIAGNOSIS — M542 Cervicalgia: Secondary | ICD-10-CM

## 2020-06-18 DIAGNOSIS — E119 Type 2 diabetes mellitus without complications: Secondary | ICD-10-CM

## 2020-06-18 MED ORDER — BUSPIRONE HCL 10 MG PO TABS: 10.0000 mg | ORAL_TABLET | Freq: Two times a day (BID) | ORAL | 0 refills | Status: DC

## 2020-06-18 NOTE — Progress Notes (Signed)
This visit occurred during the SARS-CoV-2 public health emergency.  Safety protocols were in place, including screening questions prior to the visit, additional usage of staff PPE, and extensive cleaning of exam room while observing appropriate contact time as indicated for disinfecting solutions.    [name] , [name], 64 y.o., male MRN: [id number] Patient Care Team    Relationship Specialty Notifications Start End  Ma Hillock, DO PCP - General Family Medicine  11/29/18   Bernarda Caffey, MD Consulting Physician Ophthalmology  01/09/19     Chief Complaint  Patient presents with  . Diabetes    fasting 198 and 145 today   . Neck Pain    x 3 mo off and on sharp pain from back of head to neck. feels like cramping.      Subjective: Pt presents for an OV with complaints of elevated sugars and neck discomfort.   Diabetes/overweight:  Patient has been a diabetic since 1998.  He reports  appliance with Janumet/Metformin 1000-100 mg daily, glipizide 10 mg twice daily, Farxiga 10 mg daily. Patient denies dizziness, hyperglycemic or hypoglycemic events. Patient denies numbness, tingling in the extremities or nonhealing wounds of feet.  Patient reports he is noticing an increasing trend in his fasting glucoses.  He states it was 145 today, but that has been the lowest in some time he routinely sees fasting blood glucose is around 198.  He denies any recent illness.  He does admit that he has not been exercising as much as he used to.  He has spoken with nutrition and attempt to follow a diabetic diet PNA series: Completed after 63 Flu shot:  completed 12/30/2019.  BMP: Up-to-date 01/2020 Foot exam: Completed 11/29/2018-referral to podiatry.  Eye exam:  Completed 01/08/2019-[place]>>encouraged to schedule.  A1c:10.4>>>9.4>>  9.7>>11.8>> 8.5 >>> 6.5 >>>7.1>>7.6 last A1c  Anxiety: Patient is prescribed Valium and trazodone.  He feels his daytime anxiety is still increased.  He has been tried  on Paxil.  Initially he states that helpful, then felt that it gave him more anxiety and he discontinued. Neck pain: Patient reports his current neck pain is not like his prior neck pain.  This is a sharp pain he experiences 1 times a week to less than 1 times a month.  He describes the pain as a sharp muscle spasm-like pain that lasts less than a few seconds.  No associated symptoms with pain. Prior note: Patient has had recurrent posterior neck pain and headache.  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  He states he found an old prescription of naproxen and restarted it the last time he had symptoms.  He states it worked well for him.  He is wondering if we could get refills for him today.  In the past he has stated he could not tolerate NSAIDs.    Depression screen [facility] 2/9 02/13/2020 06/04/2019 02/12/2019 11/29/2018  Decreased Interest 0 3 0 3  Down, Depressed, Hopeless '1 3 3 3  ' PHQ - 2 Score '1 6 3 6  ' Altered sleeping - 3 0 3  Tired, decreased energy - 3 0 3  Change in appetite - 3 0 3  Feeling bad or failure about yourself  - '1 1 3  ' Trouble concentrating - 0 0 3  Moving slowly or fidgety/restless - 0 0 2  Suicidal thoughts - 0 0 0  PHQ-9 Score - '16 4 23  ' Difficult  doing work/chores - Very difficult Not difficult at all -    Allergies  Allergen Reactions  . Gabapentin Other (See Comments)    Paranoia/hallucinations  . Hydrocodone Other (See Comments)    PT reports  Confusion when tacking Hydrocodone  . Nsaids     History of heart disease, CAD with history of MI, CABG status post stent  . Prednisone     Paranoia  . Tramadol   . 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 Alcohol addiction ([place])   . Anxiety   . Arthritis   . Bulging of lumbar intervertebral disc 2017   MRI: L3-4, L4-5 and L5-S1- facet hypertrophy and formainal stenosis  . CAD (coronary artery disease)   . Coronary artery disease with hx of myocardial infarct w/o hx of CABG 2004  . Depression   . Dextrocardia   . Diabetes ([place])   . Heart attack [facility]) 2004   Stent placement 2008  . Hyperlipidemia   . Hypertension   . Insomnia    Past Surgical History:  Procedure Laterality Date  . CORONARY ANGIOPLASTY WITH STENT PLACEMENT  2008   Family History  Problem Relation Age of Onset  . Heart attack Mother   . Heart disease Mother   . Diabetes Mother   . Arthritis Mother   . Hyperlipidemia Mother   . Hypertension Mother   . Breast cancer Mother   . Heart attack Father   . Heart disease Father   . Alcohol abuse Father   . Early death Father   . Arthritis Sister   . Heart disease Sister   . Hyperlipidemia Sister   . Asthma Sister   . COPD Sister   . Diabetes Sister   . Alcohol abuse Sister    Allergies as of 06/18/2020       Reactions   Gabapentin Other (See Comments)   Paranoia/hallucinations   Hydrocodone Other (See Comments)   PT reports  Confusion when tacking Hydrocodone   Nsaids    History of heart disease, CAD with history of MI, CABG status post stent   Prednisone    Paranoia   Tramadol    Penicillins Other (See Comments)   Childhood unsure if still allgeric        Medication List        Accurate as of June 18, 2020 11:40 AM. If you have any questions, ask your nurse or doctor.          Accu-Chek FastClix Lancets Misc   Accu-Chek Guide test strip Generic drug: glucose blood Use to check sugars up to three times daily DX11.9   aspirin EC 81 MG tablet Take 1 tablet (81 mg total) by mouth daily.   blood glucose meter kit and supplies Kit Dispense based on patient and insurance preference. Use up to four times daily as  directed. DX E11.9   dapagliflozin propanediol 10 MG Tabs tablet Commonly known as: Farxiga Take 1 tablet (10 mg total) by mouth daily before breakfast.   diazepam 5 MG tablet Commonly known as: VALIUM Take 1 tablet (5 mg total) by mouth every 6 (six) hours as needed for  anxiety.   glipiZIDE 10 MG tablet Commonly known as: GLUCOTROL TAKE  (1)  TABLET TWICE A DAY.   lisinopril-hydrochlorothiazide 20-25 MG tablet Commonly known as: ZESTORETIC Take 1 tablet by mouth daily.   metoprolol succinate 25 MG 24 hr tablet Commonly known as: TOPROL-XL Take 1 tablet (25 mg total) by mouth daily.   naproxen 500 MG tablet Commonly known as: NAPROSYN Every 12 hours with food as needed only   nitroGLYCERIN 0.4 MG SL tablet Commonly known as: NITROSTAT Place 1 tablet (0.4 mg total) under the tongue every 5 (five) minutes as needed for chest pain.   omega-3 acid ethyl esters 1 g capsule Commonly known as: LOVAZA Take 1 capsule by mouth at bedtime.   rosuvastatin 40 MG tablet Commonly known as: CRESTOR Take 1 tablet (40 mg total) by mouth daily.   SitaGLIPtin-MetFORMIN HCl [id number] MG Tb24 Take 1 tablet by mouth daily.   traZODone 50 MG tablet Commonly known as: DESYREL Take 1 tablet (50 mg total) by mouth at bedtime as needed for sleep.        All past medical history, surgical history, allergies, family history, immunizations andmedications were updated in the EMR today and reviewed under the history and medication portions of their EMR.     ROS: Negative, with the exception of above mentioned in HPI   Objective:  BP (!) 104/62   Pulse 58   Temp 98.6 F (37 C) (Temporal)   Ht '5\' 11"'  (1.803 m)   Wt (!) 204 lb (92.5 kg)   SpO2 96%   BMI 28.45 kg/m  Body mass index is 28.45 kg/m. Gen: Afebrile. No acute distress. Nontoxic in appearance, well developed, well nourished.  HENT: AT. [place]. Eyes:Pupils Equal Round Reactive to light, Extraocular movements intact,  Conjunctiva  without redness, discharge or icterus. CV: RRR no murmur, no edema Chest: CTAB, no wheeze or crackles. Good air movement, normal resp effort.  Skin: No rashes, purpura or petechiae.  Neuro:  Normal gait. PERLA. EOMi. Alert. Oriented x3  Psych: Normal affect, dress and demeanor. Normal speech. Normal thought content and judgment.  No exam data present No results found. No results found for this or any previous visit (from the past 24 hour(s)).  Assessment/Plan: ISHMAEL BERKOVICH is a 64 y.o. male present for OV for  Controlled type 2 diabetes mellitus without complication, without long-term current use of insulin ([place]) Patient is currently on Janumet, glipizide, Farxiga with increasing sugars over the last few weeks.  He was encouraged to try to get back to exercising more routinely.  When he was walking daily his A1c was at goal. Discussed endocrinology referral> placed - Insulin and C-Peptide - Fructosamine  Neck discomfort Symptoms sound consistent with SCM spasm.  This occurs very infrequently for him and only last a few seconds.  Encouraged him to perform daily stretches and this was demonstrated to him today.  Anxiety: Could use more control. Continue Valium Continue trazodone Start BuSpar 10 mg twice daily, then increase to 3 times daily if needed after 3 days. If it is working well for him he will call back in 3 weeks, and we will refill this for him.   Reviewed expectations re: course of current medical issues.  Discussed self-management of symptoms.  Outlined signs and symptoms indicating need for more acute intervention.  Patient verbalized understanding and all questions were answered.  Patient received an After-Visit Summary.     No orders of the defined types were placed in this  encounter.  No orders of the defined types were placed in this encounter.  Referral Orders  No referral(s) requested today     Note is dictated utilizing voice recognition  software. Although note has been proof read prior to signing, occasional typographical errors still can be missed. If any questions arise, please do not hesitate to call for verification.   electronically signed by:  Howard Pouch, DO  [place]

## 2020-06-18 NOTE — Patient Instructions (Addendum)
Start buspar 1 tab every 12 hours. If you feel it is working well for you call in 3 weeks and we will call it into your mail in pharmacy.   Once I get the results from today's labs> we will decide on starting insulin or not.  Start the stretches for your neck that we talked about.

## 2020-06-19 LAB — INSULIN AND C-PEPTIDE, SERUM
C-Peptide: 5.2 ng/mL — ABNORMAL HIGH (ref 1.1–4.4)
INSULIN: 24.9 u[IU]/mL (ref 2.6–24.9)

## 2020-06-22 ENCOUNTER — Telehealth: Payer: Self-pay

## 2020-06-22 ENCOUNTER — Telehealth: Payer: Self-pay | Admitting: Family Medicine

## 2020-06-22 DIAGNOSIS — E119 Type 2 diabetes mellitus without complications: Secondary | ICD-10-CM

## 2020-06-22 LAB — FRUCTOSAMINE: Fructosamine: 297 umol/L — ABNORMAL HIGH (ref 205–285)

## 2020-06-22 NOTE — Telephone Encounter (Signed)
Please inform patient the following information: His test would suggest his body still makes insulin.  There are other options for medications, however some of those types of medications you can not take with the [place].   I believe he would benefit from evaluation from endocrine to make suggestions on his diabetes management. I have placed this referral for him today and they will call to get him scheduled. In the meantime continue to monitor fasting glucose in the morning and one additional random glucose later in the day.  Exercise- try to get out and walk like he had been prior. This really helped his a1c in the past.  Continue following the diabetic diet.

## 2020-06-22 NOTE — Telephone Encounter (Signed)
Please call regarding his lab results.  He does not understand what came thru on his mychart.  Please call [phone]

## 2020-06-22 NOTE — Telephone Encounter (Signed)
Pt was called and VM was left to return call  °

## 2020-06-22 NOTE — Telephone Encounter (Signed)
Pt was given all information and results, he verbalized understanding

## 2020-06-22 NOTE — Telephone Encounter (Signed)
See other telephone note.  

## 2020-07-13 ENCOUNTER — Other Ambulatory Visit: Payer: Self-pay | Admitting: Family Medicine

## 2020-07-14 NOTE — Telephone Encounter (Signed)
Attempted to call pt to find out how medication was doing for him. Number is disconnected. Will send MyChart message.   RF request forBUSPIRONE HCL 10 MG TABLET LOV:06/18/20 Next ov: n/a Last written:06/18/20 (60,0)

## 2020-07-14 NOTE — Telephone Encounter (Signed)
Refilled BuSpar for him today.  Sent to his pharmacy in [place].

## 2020-08-04 ENCOUNTER — Telehealth: Payer: Self-pay | Admitting: Cardiology

## 2020-08-04 NOTE — Telephone Encounter (Signed)
Spoke to patient he stated he had chest pain this past Saturday.He took NTG x 1 with relief.Stated that is first time he has took a NTG. Stated he has not had any more chest pain since.Appointment scheduled with Edd Fabian NP 9/10 at 2:15 pm.

## 2020-08-04 NOTE — Telephone Encounter (Signed)
Pt c/o of Chest Pain: STAT if CP now or developed within 24 hours  1. Are you having CP right now? no  2. Are you experiencing any other symptoms (ex. SOB, nausea, vomiting, sweating)? No   3. How long have you been experiencing CP? Several weeks, getting worse over time  4. Is your CP continuous or coming and going? Comes and goes  5. Have you taken Nitroglycerin?  Saturday he took a nitroglycerin pill, it did help  Patient states chest tightness is just when he pulling or doing activities. He also gets SOB at that time. He stated that his heart is on the right side of his chest. ?

## 2020-08-06 NOTE — Progress Notes (Signed)
Cardiology Clinic Note   Patient Name: [name] Date of Encounter: 08/07/2020  Primary Care Provider:  Ma Hillock, DO Primary Cardiologist:  Kirk Ruths, MD  Patient Profile    [name] 64 year old male presents to the clinic today for an evaluation of his chest pain.  Past Medical History    Past Medical History:  Diagnosis Date   Alcohol addiction ([place])    Anxiety    Arthritis    Bulging of lumbar intervertebral disc 2017   MRI: L3-4, L4-5 and L5-S1- facet hypertrophy and formainal stenosis   CAD (coronary artery disease)    Coronary artery disease with hx of myocardial infarct w/o hx of CABG 2004   Depression    Dextrocardia    Diabetes ([place])    Heart attack ([place]) 2004   Stent placement 2008   Hyperlipidemia    Hypertension    Insomnia    Past Surgical History:  Procedure Laterality Date   CORONARY ANGIOPLASTY WITH STENT PLACEMENT  2008    Allergies  Allergies  Allergen Reactions   Gabapentin Other (See Comments)    Paranoia/hallucinations   Hydrocodone Other (See Comments)    PT reports  Confusion when tacking Hydrocodone   Nsaids     History of heart disease, CAD with history of MI, CABG status post stent   Prednisone     Paranoia   Tramadol    Penicillins Other (See Comments)    Childhood unsure if still allgeric    History of Present Illness    [name] has a PMH of coronary artery disease with PCI to his LAD in the setting of MI 2004.  He underwent PCI of his RCA in 2008.  Follow-up cardiac catheterization in 2008 showed patent stents.  Echocardiogram 7/18 showed ejection fraction of 45-50%, mild aortic stenosis with mean gradient 9 mmHg and mild right ventricular enlargement.  Nuclear stress test 7/18 showed LAD infarct but no ischemia.  He was last seen by Dr. Stanford Breed on 10/18/2019.  During that time he denied shortness of breath, palpitations, and syncope.  He denied lower extremity edema.  He indicated  that he had occasional sharp chest pain that would last for 30 seconds at a time however, denied exertional chest pain.  His symptoms were unchanged.  Patient contacted nurse triage line on 08/04/2020 and indicated that he was having chest pain on Saturday.  He took nitroglycerin x1 which provided relief.  He has not had any recurrent episodes of chest pain.  This was his first time taking nitroglycerin.  He presents the clinic today for follow-up evaluation and states he had one episode of chest pain last weekend.  He states he felt he slept wrong.  He did not notice his chest discomfort during physical activity.  He was sitting in his recliner and noticed an ache on the right upper chest.  He has had no further episodes of chest discomfort and continues to walk greater than 10 miles each week.  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  He is following a heart healthy low-carb diet.  I will give him salty 6 diet sheet, refill his nitroglycerin, and have him follow-up in 6 to 9 months.  Today he denies chest pain, shortness of breath, lower extremity edema, fatigue, palpitations, melena, hematuria, hemoptysis, diaphoresis, weakness, presyncope, syncope, orthopnea, and PND.   Home Medications    Prior to Admission medications  Medication Sig Start Date End Date Taking? Authorizing Provider  Accu-Chek FastClix Lancets MISC  05/11/19   [provider]  aspirin EC 81 MG tablet Take 1 tablet (81 mg total) by mouth daily. 03/25/19   Lelon Perla, MD  blood glucose meter kit and supplies KIT Dispense based on patient and insurance preference. Use up to four times daily as directed. DX E11.9 12/31/19   Kuneff, Renee A, DO  busPIRone (BUSPAR) 10 MG tablet Take 1 tablet (10 mg total) by mouth 3 (three) times daily. 07/14/20   Kuneff, Renee A, DO  dapagliflozin propanediol (FARXIGA) 10 MG TABS tablet Take 1 tablet (10 mg total) by mouth  daily before breakfast. 05/11/20   Kuneff, Renee A, DO  diazepam (VALIUM) 5 MG tablet Take 1 tablet (5 mg total) by mouth every 6 (six) hours as needed for anxiety. 05/11/20   Kuneff, Renee A, DO  glipiZIDE (GLUCOTROL) 10 MG tablet TAKE  (1)  TABLET TWICE A DAY. 05/11/20   Kuneff, Renee A, DO  glucose blood (ACCU-CHEK GUIDE) test strip Use to check sugars up to three times daily DX11.9 06/13/19   Kuneff, Renee A, DO  lisinopril-hydrochlorothiazide (ZESTORETIC) 20-25 MG tablet Take 1 tablet by mouth daily. 05/11/20   Kuneff, Renee A, DO  metoprolol succinate (TOPROL-XL) 25 MG 24 hr tablet Take 1 tablet (25 mg total) by mouth daily. 05/11/20   Kuneff, Renee A, DO  naproxen (NAPROSYN) 500 MG tablet Every 12 hours with food as needed only 12/30/19   Kuneff, Renee A, DO  nitroGLYCERIN (NITROSTAT) 0.4 MG SL tablet Place 1 tablet (0.4 mg total) under the tongue every 5 (five) minutes as needed for chest pain. 10/18/19 01/16/20  Lelon Perla, MD  omega-3 acid ethyl esters (LOVAZA) 1 g capsule Take 1 capsule by mouth at bedtime. 02/13/20   [provider]  rosuvastatin (CRESTOR) 40 MG tablet Take 1 tablet (40 mg total) by mouth daily. 02/06/20   Lelon Perla, MD  SitaGLIPtin-MetFORMIN HCl [id number] MG TB24 Take 1 tablet by mouth daily. 05/11/20   Kuneff, Renee A, DO  traZODone (DESYREL) 50 MG tablet Take 1 tablet (50 mg total) by mouth at bedtime as needed for sleep. 05/11/20   Ma Hillock, DO    Family History    Family History  Problem Relation Age of Onset   Heart attack Mother    Heart disease Mother    Diabetes Mother    Arthritis Mother    Hyperlipidemia Mother    Hypertension Mother    Breast cancer Mother    Heart attack Father    Heart disease Father    Alcohol abuse Father    Early death Father    Arthritis Sister    Heart disease Sister    Hyperlipidemia Sister    Asthma Sister    COPD Sister    Diabetes Sister    Alcohol abuse Sister    He indicated  that his mother is deceased. He indicated that his father is deceased. He indicated that his sister is alive. He indicated that his maternal grandmother is deceased. He indicated that his maternal grandfather is deceased. He indicated that his paternal grandmother is deceased. He indicated that his paternal grandfather is deceased.  Social History    Social History   Socioeconomic History   Marital status: Single    Spouse name: Not on file   Number of children: 0   Years of education: HS   Highest education  level: Not on file  Occupational History   Occupation: Full time    Employer: UNEMPLOYED  Tobacco Use   Smoking status: Never Smoker   Smokeless tobacco: Never Used  Vaping Use   Vaping Use: Never used  Substance and Sexual Activity   Alcohol use: Yes    Alcohol/week: 0.0 standard drinks    Comment: Occasional   Drug use: Not Currently    Types: Cocaine, Marijuana    Comment: Former   Sexual activity: Not Currently    Partners: Female  Other Topics Concern   Not on file  Social History Narrative   Marital status/children/pets: Single   Education/employment: HS grad, 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:      -smoke alarm in the home:Yes     - wears seatbelt: Yes      Social Determinants of Radio broadcast assistant Strain:    Difficulty of Paying Living Expenses: Not on file  Food Insecurity:    Worried About Charity fundraiser in the Last Year: Not on file   [employer] of Food in the Last Year: Not on file  Transportation Needs:    Lack of Transportation (Medical): Not on file   Lack of Transportation (Non-Medical): Not on file  Physical Activity:    Days of Exercise per Week: Not on file   Minutes of Exercise per Session: Not on file  Stress:    Feeling of Stress : Not on file  Social Connections:    Frequency of Communication with Friends and Family: Not on file   Frequency of Social Gatherings with Friends and Family: Not on file    Attends Religious Services: Not on file   Active Member of Clubs or Organizations: Not on file   Attends Archivist Meetings: Not on file   Marital Status: Not on file  Intimate Partner Violence:    Fear of Current or Ex-Partner: Not on file   Emotionally Abused: Not on file   Physically Abused: Not on file   Sexually Abused: Not on file     Review of Systems    General:  No chills, fever, night sweats or weight changes.  Cardiovascular:  No chest pain, dyspnea on exertion, edema, orthopnea, palpitations, paroxysmal nocturnal dyspnea. Dermatological: No rash, lesions/masses Respiratory: No cough, dyspnea Urologic: No hematuria, dysuria Abdominal:   No nausea, vomiting, diarrhea, bright red blood per rectum, melena, or hematemesis Neurologic:  No visual changes, wkns, changes in mental status. All other systems reviewed and are otherwise negative except as noted above.  Physical Exam    VS:  BP 90/62    Pulse (!) 59    Temp 97.9 F (36.6 C)    Ht '5\' 10"'  (1.778 m)    Wt 202 lb 6.4 oz (91.8 kg)    SpO2 94%    BMI 29.04 kg/m  , BMI Body mass index is 29.04 kg/m. GEN: Well nourished, well developed, in no acute distress. HEENT: normal. Neck: Supple, no JVD, carotid bruits, or masses. Cardiac: RRR, no murmurs, rubs, or gallops. No clubbing, cyanosis, edema.  Radials/DP/PT 2+ and equal bilaterally.  Respiratory:  Respirations regular and unlabored, clear to auscultation bilaterally. GI: Soft, nontender, nondistended, BS + x 4. MS: no deformity or atrophy. Skin: warm and dry, no rash. Neuro:  Strength and sensation are intact. Psych: Normal affect.  Accessory Clinical Findings    Recent Labs: 09/18/2019: Hemoglobin 15.0; Platelets 225.0 12/30/2019: TSH 1.86 02/13/2020: ALT 24; BUN 24; Creatinine, Ser  0.90; Potassium 4.9; Sodium 134   Recent Lipid Panel    Component Value Date/Time   CHOL 100 12/30/2019 1222   TRIG 122.0 12/30/2019 1222   HDL 42.10 12/30/2019  1222   CHOLHDL 2 12/30/2019 1222   VLDL 24.4 12/30/2019 1222   LDLCALC 33 12/30/2019 1222   LDLDIRECT 87.0 02/12/2019 0917    ECG personally reviewed by me today-sinus bradycardia with first-degree AV block 59 bpm- No acute changes  Echocardiogram 03/06/2020 IMPRESSIONS    1. Technically difficult study. Left ventricular ejection fraction, by  estimation, is 45 to 50%. The left ventricle has grossly mildly decreased  function. Image quality is not sufficient to assess for regional wall  motion abnormalities. Left ventricular  diastolic parameters are indeterminate.  2. Right ventricle is poorly visualized. Grossly normal size and systolic  function. Tricuspid regurgitation signal is inadequate for assessing PA  pressure.  3. The mitral valve is normal in structure. No evidence of mitral valve  regurgitation.  4. Aortic dilatation noted. There is mild dilatation of the ascending  aorta measuring 37 mm.  5. The aortic valve is abnormal. Moderately calcified. Aortic valve  regurgitation is not visualized. Mild aortic valve stenosis. Vmax 2.2 m/s,  MG 10 mmHg, AVA 1.7 cm^2, DI 0.4  Nuclear stress test 06/20/2017  There was no ST segment deviation noted during stress.  Defect 1: There is a large defect of severe severity present in the basal anteroseptal, mid anterior, mid anteroseptal, apical anterior, apical septal, apical lateral and apex location.  Findings consistent with prior myocardial infarction.   Intermediate risk study due to an extensive fixed defect (scar) occupying virtually the entire LAD artery distribution.\ Ischemia is not seen.  Left ventricular regional and global function could not be evaluated on the current study. Direct comparison with 2017 perfusion images shows no change. Difference in nomenclature of affected segments is due to dextrocardia. Previous study also shows anterior and anteroseptal defect (labeled anterior and "anterolateral" due to  conventional orientation of images). The left circumflex territory shows normal perfusion pattern.   Assessment & Plan   1.  Coronary artery disease-no chest pain today.  Cardiac catheterization 2008 showed patent LAD and RCA stents.  Nuclear stress test 7/18 showed no ischemia, intermediate risk due to extensive thick scar occupying entire LAD artery distribution.  Contacted nurse triage line 08/04/2020 and indicated that he had taken 1 nitroglycerin which did relieve the pain.  Has had no further episodes of chest pain and continues to walk 10 miles per week without any chest discomfort. Continue aspirin, lisinopril/HCTZ, metoprolol, nitroglycerin, rosuvastatin Heart healthy low-sodium diet-salty 6 given Increase physical activity as tolerated  Essential hypertension-BP today 90/62.  Well-controlled at home Continue lisinopril/HCTZ, metoprolol Heart healthy low-sodium diet-salty 6 given Increase physical activity as tolerated  Hyperlipidemia-12/30/2019: Cholesterol 100; HDL 42.10; LDL Cholesterol 33; Triglycerides 122.0; VLDL 24.4 Continue rosuvastatin Heart healthy low-sodium diet-salty 6 given Increase physical activity as tolerated  Aortic stenosis-no increased DOE or activity intolerance.  Echocardiogram 4/21 showed Mild aortic valve stenosis. Vmax 2.2 m/s,  MG 10 mmHg, AVA 1.7 cm^2, DI 0.4 Repeat echocardiogram 4/22  Disposition: Follow-up with Dr. Stanford Breed in 6-9 months.  Jossie Ng. Jozsef Wescoat NP-C    08/07/2020, 3:19 PM [address] Office [phone] Fax [phone]  Notice: This dictation was prepared with Dragon dictation along with smaller phrase technology. Any transcriptional errors that result from this process are unintentional and may not be corrected upon review.

## 2020-08-07 ENCOUNTER — Ambulatory Visit (INDEPENDENT_AMBULATORY_CARE_PROVIDER_SITE_OTHER): Payer: Medicare Other | Admitting: General Practice

## 2020-08-07 ENCOUNTER — Encounter: Payer: Self-pay | Admitting: General Practice

## 2020-08-07 ENCOUNTER — Other Ambulatory Visit: Payer: Self-pay

## 2020-08-07 VITALS — BP 90/62 | HR 59 | Temp 97.9°F | Ht 70.0 in | Wt 202.4 lb

## 2020-08-07 DIAGNOSIS — I10 Essential (primary) hypertension: Secondary | ICD-10-CM

## 2020-08-07 DIAGNOSIS — I350 Nonrheumatic aortic (valve) stenosis: Secondary | ICD-10-CM

## 2020-08-07 DIAGNOSIS — E7800 Pure hypercholesterolemia, unspecified: Secondary | ICD-10-CM

## 2020-08-07 DIAGNOSIS — I2510 Atherosclerotic heart disease of native coronary artery without angina pectoris: Secondary | ICD-10-CM | POA: Diagnosis not present

## 2020-08-07 MED ORDER — NITROGLYCERIN 0.4 MG SL SUBL: 0.4000 mg | SUBLINGUAL_TABLET | SUBLINGUAL | 0 refills | Status: DC | PRN

## 2020-08-07 NOTE — Patient Instructions (Signed)
Medication Instructions:  Continue current medications  *If you need a refill on your cardiac medications before your next appointment, please call your pharmacy*   Lab Work: None Ordered   Testing/Procedures: None Ordered   Follow-Up: At [facility], you and your health needs are our priority.  As part of our continuing mission to provide you with exceptional heart care, we have created designated Provider Care Teams.  These Care Teams include your primary Cardiologist (physician) and Advanced Practice Providers (APPs -  Physician Assistants and Nurse Practitioners) who all work together to provide you with the care you need, when you need it.  We recommend signing up for the patient portal called "MyChart".  Sign up information is provided on this After Visit Summary.  MyChart is used to connect with patients for Virtual Visits (Telemedicine).  Patients are able to view lab/test results, encounter notes, upcoming appointments, etc.  Non-urgent messages can be sent to your provider as well.   To learn more about what you can do with MyChart, go to [email or web address].    Your next appointment:   6-9 month(s)  The format for your next appointment:   In Person  Provider:   You may see Olga Millers, MD or one of the following Advanced Practice Providers on your designated Care Team:    Corine Shelter, PA-C  [place], [place]  Edd Fabian, [place]

## 2020-08-08 LAB — LIPID PANEL
Chol/HDL Ratio: 2.4 ratio (ref 0.0–5.0)
Cholesterol, Total: 113 mg/dL (ref 100–199)
HDL: 47 mg/dL (ref >39)
LDL Chol Calc (NIH): 44 mg/dL (ref 0–99)
Triglycerides: 125 mg/dL (ref 0–149)
VLDL Cholesterol Cal: 22 mg/dL (ref 5–40)

## 2020-08-08 LAB — HEPATIC FUNCTION PANEL
ALT: 23 IU/L (ref 0–44)
AST: 17 IU/L (ref 0–40)
Albumin: 4.8 g/dL (ref 3.8–4.8)
Alkaline Phosphatase: 46 IU/L — ABNORMAL LOW (ref 48–121)
Bilirubin Total: 0.3 mg/dL (ref 0.0–1.2)
Bilirubin, Direct: 0.12 mg/dL (ref 0.00–0.40)
Total Protein: 7.4 g/dL (ref 6.0–8.5)

## 2020-09-14 ENCOUNTER — Other Ambulatory Visit: Payer: Self-pay | Admitting: Family Medicine

## 2020-09-23 ENCOUNTER — Other Ambulatory Visit: Payer: Self-pay

## 2020-09-23 ENCOUNTER — Ambulatory Visit (INDEPENDENT_AMBULATORY_CARE_PROVIDER_SITE_OTHER): Payer: Medicare Other | Admitting: Family Medicine

## 2020-09-23 ENCOUNTER — Encounter: Payer: Self-pay | Admitting: Family Medicine

## 2020-09-23 VITALS — BP 90/51 | HR 56 | Temp 98.4°F | Ht 71.0 in | Wt 206.0 lb

## 2020-09-23 DIAGNOSIS — Z955 Presence of coronary angioplasty implant and graft: Secondary | ICD-10-CM

## 2020-09-23 DIAGNOSIS — I2510 Atherosclerotic heart disease of native coronary artery without angina pectoris: Secondary | ICD-10-CM

## 2020-09-23 DIAGNOSIS — E119 Type 2 diabetes mellitus without complications: Secondary | ICD-10-CM

## 2020-09-23 DIAGNOSIS — G4700 Insomnia, unspecified: Secondary | ICD-10-CM

## 2020-09-23 DIAGNOSIS — I10 Essential (primary) hypertension: Secondary | ICD-10-CM

## 2020-09-23 DIAGNOSIS — E1142 Type 2 diabetes mellitus with diabetic polyneuropathy: Secondary | ICD-10-CM

## 2020-09-23 DIAGNOSIS — I252 Old myocardial infarction: Secondary | ICD-10-CM

## 2020-09-23 DIAGNOSIS — E7800 Pure hypercholesterolemia, unspecified: Secondary | ICD-10-CM | POA: Diagnosis not present

## 2020-09-23 DIAGNOSIS — Z23 Encounter for immunization: Secondary | ICD-10-CM | POA: Diagnosis not present

## 2020-09-23 DIAGNOSIS — F411 Generalized anxiety disorder: Secondary | ICD-10-CM | POA: Diagnosis not present

## 2020-09-23 DIAGNOSIS — E118 Type 2 diabetes mellitus with unspecified complications: Secondary | ICD-10-CM

## 2020-09-23 DIAGNOSIS — E113293 Type 2 diabetes mellitus with mild nonproliferative diabetic retinopathy without macular edema, bilateral: Secondary | ICD-10-CM

## 2020-09-23 LAB — POCT GLYCOSYLATED HEMOGLOBIN (HGB A1C)
HbA1c POC (<> result, manual entry): 7.7 % (ref 4.0–5.6)
HbA1c, POC (controlled diabetic range): 7.7 % — AB (ref 0.0–7.0)
HbA1c, POC (prediabetic range): 7.7 % — AB (ref 5.7–6.4)
Hemoglobin A1C: 7.7 % — AB (ref 4.0–5.6)

## 2020-09-23 MED ORDER — SITAGLIP PHOS-METFORMIN HCL ER 100-1000 MG PO TB24: 1.0000 | ORAL_TABLET | Freq: Every day | ORAL | 1 refills | Status: DC

## 2020-09-23 MED ORDER — DAPAGLIFLOZIN PROPANEDIOL 10 MG PO TABS
ORAL_TABLET | ORAL | 3 refills | Status: DC
Start: 2020-09-23 — End: 2021-01-25

## 2020-09-23 MED ORDER — METOPROLOL SUCCINATE ER 25 MG PO TB24: 25.0000 mg | ORAL_TABLET | Freq: Every day | ORAL | 1 refills | Status: DC

## 2020-09-23 MED ORDER — DIAZEPAM 5 MG PO TABS: 5.0000 mg | ORAL_TABLET | Freq: Four times a day (QID) | ORAL | 5 refills | Status: DC | PRN

## 2020-09-23 MED ORDER — GLIPIZIDE 10 MG PO TABS: ORAL_TABLET | ORAL | 1 refills | Status: DC

## 2020-09-23 MED ORDER — TRAZODONE HCL 50 MG PO TABS: 50.0000 mg | ORAL_TABLET | Freq: Every evening | ORAL | 1 refills | Status: DC | PRN

## 2020-09-23 NOTE — Patient Instructions (Signed)
Nice to see you today.  I have refilled your meds for you.  A1c about the same at 7.7  Take medications as prescribed.   The valium refill went to [facility].

## 2020-09-23 NOTE — Progress Notes (Signed)
This visit occurred during the SARS-CoV-2 public health emergency.  Safety protocols were in place, including screening questions prior to the visit, additional usage of staff PPE, and extensive cleaning of exam room while observing appropriate contact time as indicated for disinfecting solutions.    [name] , 1956/10/29, 64 y.o., male MRN: [id number] Patient Care Team    Relationship Specialty Notifications Start End  Ma Hillock, DO PCP - General Family Medicine  11/29/18   Lelon Perla, MD PCP - Cardiology Cardiology  08/07/20   Bernarda Caffey, MD Consulting Physician Ophthalmology  01/09/19     Chief Complaint  Patient presents with  . Follow-up    [facility]     Subjective: Pt presents for an OV for cmc Depression/anxiety/insomnia: Patient reports he is feeling well on Valium 4 times daily and trazodone 50 mg nightly.    He had been on BuSpar as well which he originally thought was helpful, but then he felt that it was not as helpful.  [place] controlled substance database reviewed 09/23/2020. Original note: He states he has a rather routine day. He does not have much social contact in a day. He lives by himself. He reports that he always feels like he "stays on edge. "He reports when he wakes up he is already feeling on edge and he will take a Valium at that time. He then has breakfast and goes on a 4 mile walk daily. For lunch he starts to feel his anxiety sudden and he becomes panicked. He states when he feels this way he will break out in a sweat, become very fearful and scared. He states he commonly fears there is someone in the house with a gun. He goes to his bedroom gets in his bed.He states the fear is so intense and he just cannot control it. He denies any known trauma or event experienced in his life that would create such a fear. He states he did go see a counselor/psychiatrist at [facility] in the past6 months, and he did not feel that was helpful  for him. Denies any SI or HI.  Diabetes/overweight: Patient reports he has been a diabetic since 1998. He reports  compliance with Janumet/Metformin 1000-100 mg daily, glipizide 10 mg twice daily, Farxiga 10 mg daily. Patient denies dizziness, hyperglycemic or hypoglycemic events. Patient denies numbness, tingling in the extremities or nonhealing wounds of feet.  PNA series:Completed after 63 Flu shot:completed day 09/23/2020. Foot exam:Completed 05/11/2020-referral to podiatry.  Eye exam:Completed 09/23/2020-[facility]  A1c:10.4>>>9.4>>9.7>>11.8>>8.5 >>> 6.5 >>>7.1>7.6>7.7 today.  Hypertension/CAD/S/p Stent/prior MI: Pt has a significant cardiac history of "anterior MI in 2004 with stents was LAD and then perhaps stents to branch vessel 2008(by DR. Renaldo-cardio note 2015)" Patient reports compliance with lisinopril-HCTZ 20-25 mg daily.  Despite counseling he still occasionally will take 2 tabs daily if he feels his blood pressure is high. He is compliant with baby aspirin 81 mg daily.  Plavix was DC'd by cards.  His statin was increased by cardiology to Crestor 40 mg daily.  Patient reports compliance with metoprolol 25 mg daily Labs collected today Diet:Low-sodium Exercise:Routine exercise daily QX:[id number], hyperlipidemia, diabetes, overweight, family history,CAD, MI  Neck pain/back pain: Patient states he rarely needs to use naproxen but when he does have a headache and neck pain, it is effective. Prior note: Patient has had recurrent posterior neck pain and headache.  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  He states he found an old prescription of naproxen and restarted it the last time he had symptoms.  He states it worked well for him.  He is wondering if we could get refills for him today.  In the past he has stated he could not tolerate NSAIDs.    Erectile dysfunction: (Documentation only) patient would like to  start a medication for erectile dysfunction.  He states he seen a urologist many years ago and does not desire to see a urologist again.  Patient has a significant medical history of blood pressure with systolic less than 341 and history of CAD and MI.   Depression screen [facility] 2/9 09/23/2020 02/13/2020 06/04/2019 02/12/2019 11/29/2018  Decreased Interest 0 0 3 0 3  Down, Depressed, Hopeless 0 '1 3 3 3  ' PHQ - 2 Score 0 '1 6 3 6  ' Altered sleeping - - 3 0 3  Tired, decreased energy - - 3 0 3  Change in appetite - - 3 0 3  Feeling bad or failure about yourself  - - '1 1 3  ' Trouble concentrating - - 0 0 3  Moving slowly or fidgety/restless - - 0 0 2  Suicidal thoughts - - 0 0 0  PHQ-9 Score - - '16 4 23  ' Difficult doing work/chores - - Very difficult Not difficult at all -    Allergies  Allergen Reactions  . Gabapentin Other (See Comments)    Paranoia/hallucinations  . Hydrocodone Other (See Comments)    PT reports  Confusion when tacking Hydrocodone  . Nsaids     History of heart disease, CAD with history of MI, CABG status post stent  . Prednisone     Paranoia  . Tramadol   . 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 Alcohol addiction ([place])   . Anxiety   . Arthritis   . Bulging of lumbar intervertebral disc 2017   MRI: L3-4, L4-5 and L5-S1- facet hypertrophy and formainal stenosis  . CAD (coronary artery disease)   . Coronary artery disease with hx of myocardial infarct w/o hx of CABG 2004  . Depression   . Dextrocardia   . Diabetes ([place])   . Heart attack [facility]) 2004   Stent placement 2008  . Hyperlipidemia   . Hypertension   . Insomnia    Past Surgical History:  Procedure Laterality Date   . CORONARY ANGIOPLASTY WITH STENT PLACEMENT  2008   Family History  Problem Relation Age of Onset  . Heart attack Mother   . Heart disease Mother   . Diabetes Mother   . Arthritis Mother   . Hyperlipidemia Mother   . Hypertension Mother   . Breast cancer Mother   . Heart attack Father   . Heart disease Father   . Alcohol abuse Father   . Early death Father   . Arthritis Sister   . Heart disease Sister   . Hyperlipidemia Sister   . Asthma Sister   . COPD Sister   . Diabetes Sister   . Alcohol abuse Sister    Allergies as of 09/23/2020      Reactions  Gabapentin Other (See Comments)   Paranoia/hallucinations   Hydrocodone Other (See Comments)   PT reports  Confusion when tacking Hydrocodone   Nsaids    History of heart disease, CAD with history of MI, CABG status post stent   Prednisone    Paranoia   Tramadol    Penicillins Other (See Comments)   Childhood unsure if still allgeric      Medication List       Accurate as of September 23, 2020 11:59 PM. If you have any questions, ask your nurse or doctor.        STOP taking these medications   busPIRone 10 MG tablet Commonly known as: BUSPAR Stopped by: Howard Pouch, DO     TAKE these medications   Accu-Chek FastClix Lancets Misc   Accu-Chek Guide test strip Generic drug: glucose blood Use to check sugars up to three times daily DX11.9   aspirin EC 81 MG tablet Take 1 tablet (81 mg total) by mouth daily.   blood glucose meter kit and supplies Kit Dispense based on patient and insurance preference. Use up to four times daily as directed. DX E11.9   dapagliflozin propanediol 10 MG Tabs tablet Commonly known as: Farxiga TAKE 1 TABLET BY MOUTH  DAILY BEFORE BREAKFAST   diazepam 5 MG tablet Commonly known as: VALIUM Take 1 tablet (5 mg total) by mouth every 6 (six) hours as needed for anxiety.   glipiZIDE 10 MG tablet Commonly known as: GLUCOTROL TAKE  (1)  TABLET TWICE A DAY.    lisinopril-hydrochlorothiazide 20-25 MG tablet Commonly known as: ZESTORETIC TAKE 1 TABLET BY MOUTH  DAILY   metoprolol succinate 25 MG 24 hr tablet Commonly known as: TOPROL-XL Take 1 tablet (25 mg total) by mouth daily.   naproxen 500 MG tablet Commonly known as: NAPROSYN Every 12 hours with food as needed only   nitroGLYCERIN 0.4 MG SL tablet Commonly known as: NITROSTAT Place 1 tablet (0.4 mg total) under the tongue every 5 (five) minutes as needed for chest pain.   omega-3 acid ethyl esters 1 g capsule Commonly known as: LOVAZA Take 1 capsule by mouth at bedtime.   rosuvastatin 40 MG tablet Commonly known as: CRESTOR Take 1 tablet (40 mg total) by mouth daily.   SitaGLIPtin-MetFORMIN HCl [phone] MG Tb24 Take 1 tablet by mouth daily.   traZODone 50 MG tablet Commonly known as: DESYREL Take 1 tablet (50 mg total) by mouth at bedtime as needed for sleep.       All past medical history, surgical history, allergies, family history, immunizations andmedications were updated in the EMR today and reviewed under the history and medication portions of their EMR.     ROS: Negative, with the exception of above mentioned in HPI   Objective:  BP (!) 90/51   Pulse (!) 56   Temp 98.4 F (36.9 C) (Oral)   Ht '5\' 11"'  (1.803 m)   Wt 206 lb (93.4 kg)   SpO2 97%   BMI 28.73 kg/m  Body mass index is 28.73 kg/m. Gen: Afebrile. No acute distress.  Nontoxic in presentation.  Well-developed, well-nourished. HENT: AT. [place]. Eyes:Pupils Equal Round Reactive to light, Extraocular movements intact,  Conjunctiva without redness, discharge or icterus. Neck/lymp/endocrine: Supple, no lymphadenopathy, no thyromegaly CV: RRR no murmur, no edema, +2/4 P posterior tibialis pulses Chest: CTAB, no wheeze or crackles Skin: no rashes, purpura or petechiae.  Neuro:  Normal gait. PERLA. EOMi. Alert. Oriented x3 Psych: Normal affect, dress and demeanor. Normal  speech. Normal thought content and  judgment.    No exam data present No results found. No results found for this or any previous visit (from the past 24 hour(s)).  Assessment/Plan: ZAKARIYAH FREIMARK is a 64 y.o. male present for OV for  Controlled type 2 diabetes mellitus without complication, without long-term current use of insulin ([place]) Patient is currently on Janumet, glipizide, Farxiga maximum Doses.   -Continue current regimen for now.  He was strongly encouraged to get back to walking and start following his diabetic diet.  When he was able to do both of these his A1c was well controlled at 6.5.  He understands if his A1c is elevated again at his follow-up appointment we will need to add a another medication to his regimen.   Discussed endocrinology referral> placed last visit patient did not establish Dr. Liliane Channel office left a voicemail for patient to call back and schedule which she has not.   Neck discomfort Symptoms sound consistent with SCM spasm.  This occurs very infrequently for him and only last a few seconds.  Encouraged him to perform daily stretches. He can use naproxen as needed as long as not used routinely.  Anxiety/depression/insomnia: Stable.  Has a history of significant depression with anxiety and paranoid features. Continue Valium> called into local pharmacy since mail-in pharmacy was not filling correctly.  All other meds go to [facility]. Continue trazodone Meds tried in the past: BuSpar patient reports it was not effective.  Paxil patient reports made him more anxious. -[place] controlled substance databasereviewed  09/23/2020. Patient aware he needs a face-to-face appointment in order for any refills to be processed -Contract signed -Offered referral to counseling or psychiatry;he declined. Follow-up required every 5.5 months  Diabetes, uncontrolled/diabetic peripheral neuropathy/mild nonproliferative diabetic retinopathy of both eyes/overweight: Mildly increased today  however patient has not been able to exercise.  He states he is getting back to exercising now and thinks he can get his A1c back down without changing his medications. -Continue Janumet [phone] daily -Continue glipizide 10 mg BID.   -Continuefarixga10 mg daily PNA series:Completed after 63 Flu shot:completed day 09/23/2020. Foot exam:Completed 05/11/2020-referral to podiatry.  Eye exam:Completed 09/23/2020-[facility]  A1c:10.4>>>9.4>>9.7>>11.8>>8.5 >>> 6.5 >>>7.1>7.6>7.7 today.  Hypertension/CAD/S/p Stent/prior MI/hypercholesteremia:Pt has a significant cardiac history of "anterior MI in 2004 with stents was LAD and then stents to a branch vessel 2008(by DR. Renaldo- cardio note 2015)" - Despite lengthy conversation with him on prior office visits he still occasionally will take an additional lisinopril if he feels his blood pressure is elevated, which is can be mistaken for his anxiety.  He has been symptomatic with dizziness and hyponatremia secondary to overuse of medication.  He has been counseled on multiple occasions> we will only supply enough blood pressure medication to take as directed and he is directed to only take one lisinopril-HCTZ tabs daily.  He has become argumentative and angry when he runs out of his medications early and his pharmacy does not refill them when he wants them-it has been explained to him it is because it is too early for them to do so since he is not taking his medications as directed. -Continue lisinopril-HCTZ 20-25 mg daily -Continue metoprolol 25 mg daily -Continue aspirin 81 mg daily. Cardiology told him he no longer needed the Plavix 75 mg daily -Continue Crestor 40 mg daily> supplied by cardiology Diet:Low-sodium Exercise:Routine exercise daily - now established with cardiology as well for routine follow up.  Influenza vaccine administered today.  Reviewed  expectations re: course of current medical issues.  Discussed self-management  of symptoms.  Outlined signs and symptoms indicating need for more acute intervention.  Patient verbalized understanding and all questions were answered.  Patient received an After-Visit Summary.    Orders Placed This Encounter  Procedures  . Flu Vaccine QUAD High Dose(Fluad)  . POCT HgB A1C   Meds ordered this encounter  Medications  . metoprolol succinate (TOPROL-XL) 25 MG 24 hr tablet    Sig: Take 1 tablet (25 mg total) by mouth daily.    Dispense:  90 tablet    Refill:  1  . SitaGLIPtin-MetFORMIN HCl [phone] MG TB24    Sig: Take 1 tablet by mouth daily.    Dispense:  90 tablet    Refill:  1  . traZODone (DESYREL) 50 MG tablet    Sig: Take 1 tablet (50 mg total) by mouth at bedtime as needed for sleep.    Dispense:  90 tablet    Refill:  1  . glipiZIDE (GLUCOTROL) 10 MG tablet    Sig: TAKE  (1)  TABLET TWICE A DAY.    Dispense:  180 tablet    Refill:  1  . dapagliflozin propanediol (FARXIGA) 10 MG TABS tablet    Sig: TAKE 1 TABLET BY MOUTH  DAILY BEFORE BREAKFAST    Dispense:  90 tablet    Refill:  3    Requesting 1 year supply  . diazepam (VALIUM) 5 MG tablet    Sig: Take 1 tablet (5 mg total) by mouth every 6 (six) hours as needed for anxiety.    Dispense:  120 tablet    Refill:  5   Referral Orders  No referral(s) requested today     Note is dictated utilizing voice recognition software. Although note has been proof read prior to signing, occasional typographical errors still can be missed. If any questions arise, please do not hesitate to call for verification.   electronically signed by:  Howard Pouch, DO  [place]

## 2020-10-08 ENCOUNTER — Other Ambulatory Visit: Payer: Self-pay | Admitting: General Practice

## 2020-10-08 DIAGNOSIS — I2510 Atherosclerotic heart disease of native coronary artery without angina pectoris: Secondary | ICD-10-CM

## 2020-11-03 ENCOUNTER — Other Ambulatory Visit: Payer: Self-pay | Admitting: Cardiology

## 2020-11-03 DIAGNOSIS — E7800 Pure hypercholesterolemia, unspecified: Secondary | ICD-10-CM

## 2021-01-14 ENCOUNTER — Other Ambulatory Visit: Payer: Self-pay | Admitting: Family Medicine

## 2021-01-14 ENCOUNTER — Telehealth: Payer: Self-pay | Admitting: Family Medicine

## 2021-01-14 NOTE — Telephone Encounter (Signed)
Spoke with patient he WCB the end of Feb to scheduled AWV

## 2021-01-14 NOTE — Telephone Encounter (Signed)
Not prescribed by you in past

## 2021-01-14 NOTE — Telephone Encounter (Signed)
LVM for pt to CB in regards to rx request sent from pharmacy

## 2021-01-14 NOTE — Telephone Encounter (Signed)
Please ask either pharmacy or patient, whose name is on the prescription as the provider?

## 2021-01-14 NOTE — Telephone Encounter (Signed)
Please advise if okay?

## 2021-01-15 NOTE — Telephone Encounter (Signed)
Pt states that Dr. Claiborne Billings prescribes all medications.

## 2021-01-15 NOTE — Telephone Encounter (Signed)
[facility] Day - Client Nonclinical Telephone Record  [facility] Client [facility] Day - Client Client Site [facility] - Day Physician Claiborne Billings, [place] Contact Type Call Who Is Calling Patient / Member / Family / Caregiver Caller Name [name] Caller Phone Number [phone] Patient Name [name] Patient DOB 01/31/1956 Call Type Message Only Information Provided Reason for Call Medication Question / Request Initial Comment Caller says someone called him yesterday about to 5p and he is returning her call. Disp. Time Disposition Final User 01/15/2021 7:53:24 AM General Information Provided Yes Hurley, Christina Call Closed By: Benjamin Stain Transaction Date/Time: 01/15/2021 7:50:54 AM (ET)

## 2021-01-15 NOTE — Telephone Encounter (Signed)
Refilled for him.  

## 2021-01-20 ENCOUNTER — Other Ambulatory Visit: Payer: Self-pay | Admitting: Family Medicine

## 2021-01-20 DIAGNOSIS — I10 Essential (primary) hypertension: Secondary | ICD-10-CM

## 2021-01-20 DIAGNOSIS — E118 Type 2 diabetes mellitus with unspecified complications: Secondary | ICD-10-CM

## 2021-01-20 DIAGNOSIS — E119 Type 2 diabetes mellitus without complications: Secondary | ICD-10-CM

## 2021-01-21 ENCOUNTER — Other Ambulatory Visit: Payer: Self-pay | Admitting: Family Medicine

## 2021-01-21 DIAGNOSIS — I10 Essential (primary) hypertension: Secondary | ICD-10-CM

## 2021-01-21 DIAGNOSIS — E118 Type 2 diabetes mellitus with unspecified complications: Secondary | ICD-10-CM

## 2021-01-21 DIAGNOSIS — E119 Type 2 diabetes mellitus without complications: Secondary | ICD-10-CM

## 2021-01-22 ENCOUNTER — Other Ambulatory Visit: Payer: Self-pay | Admitting: Family Medicine

## 2021-01-22 DIAGNOSIS — I10 Essential (primary) hypertension: Secondary | ICD-10-CM

## 2021-01-22 DIAGNOSIS — E118 Type 2 diabetes mellitus with unspecified complications: Secondary | ICD-10-CM

## 2021-01-22 DIAGNOSIS — E119 Type 2 diabetes mellitus without complications: Secondary | ICD-10-CM

## 2021-01-25 ENCOUNTER — Encounter: Payer: Self-pay | Admitting: Family Medicine

## 2021-01-25 ENCOUNTER — Other Ambulatory Visit: Payer: Self-pay

## 2021-01-25 ENCOUNTER — Ambulatory Visit (INDEPENDENT_AMBULATORY_CARE_PROVIDER_SITE_OTHER): Payer: Medicare Other | Admitting: Family Medicine

## 2021-01-25 VITALS — BP 105/55 | HR 54 | Temp 98.3°F | Ht 71.0 in | Wt 213.0 lb

## 2021-01-25 DIAGNOSIS — E119 Type 2 diabetes mellitus without complications: Secondary | ICD-10-CM

## 2021-01-25 DIAGNOSIS — G4700 Insomnia, unspecified: Secondary | ICD-10-CM | POA: Diagnosis not present

## 2021-01-25 DIAGNOSIS — I2510 Atherosclerotic heart disease of native coronary artery without angina pectoris: Secondary | ICD-10-CM

## 2021-01-25 DIAGNOSIS — Z955 Presence of coronary angioplasty implant and graft: Secondary | ICD-10-CM

## 2021-01-25 DIAGNOSIS — E113293 Type 2 diabetes mellitus with mild nonproliferative diabetic retinopathy without macular edema, bilateral: Secondary | ICD-10-CM

## 2021-01-25 DIAGNOSIS — E1142 Type 2 diabetes mellitus with diabetic polyneuropathy: Secondary | ICD-10-CM | POA: Diagnosis not present

## 2021-01-25 DIAGNOSIS — F411 Generalized anxiety disorder: Secondary | ICD-10-CM

## 2021-01-25 DIAGNOSIS — I10 Essential (primary) hypertension: Secondary | ICD-10-CM

## 2021-01-25 DIAGNOSIS — I252 Old myocardial infarction: Secondary | ICD-10-CM | POA: Diagnosis not present

## 2021-01-25 DIAGNOSIS — E118 Type 2 diabetes mellitus with unspecified complications: Secondary | ICD-10-CM | POA: Diagnosis not present

## 2021-01-25 DIAGNOSIS — E7800 Pure hypercholesterolemia, unspecified: Secondary | ICD-10-CM

## 2021-01-25 LAB — CBC
HCT: 44.5 % (ref 39.0–52.0)
Hemoglobin: 14.7 g/dL (ref 13.0–17.0)
MCHC: 33 g/dL (ref 30.0–36.0)
MCV: 91 fl (ref 78.0–100.0)
Platelets: 190 10*3/uL (ref 150.0–400.0)
RBC: 4.89 Mil/uL (ref 4.22–5.81)
RDW: 13.9 % (ref 11.5–15.5)
WBC: 5.3 10*3/uL (ref 4.0–10.5)

## 2021-01-25 LAB — POCT GLYCOSYLATED HEMOGLOBIN (HGB A1C)
HbA1c POC (<> result, manual entry): 8.7 % (ref 4.0–5.6)
HbA1c, POC (controlled diabetic range): 8.7 % — AB (ref 0.0–7.0)
HbA1c, POC (prediabetic range): 8.7 % — AB (ref 5.7–6.4)
Hemoglobin A1C: 8.7 % — AB (ref 4.0–5.6)

## 2021-01-25 LAB — BASIC METABOLIC PANEL
BUN: 22 mg/dL (ref 6–23)
CO2: 26 mEq/L (ref 19–32)
Calcium: 9.4 mg/dL (ref 8.4–10.5)
Chloride: 101 mEq/L (ref 96–112)
Creatinine, Ser: 1.11 mg/dL (ref 0.40–1.50)
GFR: 69.2 mL/min (ref >60.00)
Glucose, Bld: 85 mg/dL (ref 70–99)
Potassium: 4 mEq/L (ref 3.5–5.1)
Sodium: 137 mEq/L (ref 135–145)

## 2021-01-25 LAB — TSH: TSH: 2.96 u[IU]/mL (ref 0.35–4.50)

## 2021-01-25 MED ORDER — ROSUVASTATIN CALCIUM 40 MG PO TABS: 40.0000 mg | ORAL_TABLET | Freq: Every day | ORAL | 3 refills | Status: DC

## 2021-01-25 MED ORDER — LISINOPRIL-HYDROCHLOROTHIAZIDE 20-25 MG PO TABS
1.0000 | ORAL_TABLET | Freq: Every day | ORAL | 1 refills | Status: DC
Start: 2021-01-25 — End: 2021-04-20

## 2021-01-25 MED ORDER — DAPAGLIFLOZIN PROPANEDIOL 10 MG PO TABS: ORAL_TABLET | ORAL | 1 refills | Status: DC

## 2021-01-25 MED ORDER — SITAGLIP PHOS-METFORMIN HCL ER 100-1000 MG PO TB24: 1.0000 | ORAL_TABLET | Freq: Every day | ORAL | 1 refills | Status: DC

## 2021-01-25 MED ORDER — GLIPIZIDE 10 MG PO TABS: ORAL_TABLET | ORAL | 1 refills | Status: DC

## 2021-01-25 MED ORDER — TRAZODONE HCL 100 MG PO TABS: 100.0000 mg | ORAL_TABLET | Freq: Every evening | ORAL | 1 refills | Status: DC | PRN

## 2021-01-25 MED ORDER — NAPROXEN 500 MG PO TABS: ORAL_TABLET | ORAL | 1 refills | Status: DC

## 2021-01-25 MED ORDER — DIAZEPAM 5 MG PO TABS
5.0000 mg | ORAL_TABLET | Freq: Four times a day (QID) | ORAL | 5 refills | Status: DC | PRN
Start: 2021-01-25 — End: 2021-04-20

## 2021-01-25 MED ORDER — METOPROLOL SUCCINATE ER 25 MG PO TB24: 25.0000 mg | ORAL_TABLET | Freq: Every day | ORAL | 1 refills | Status: DC

## 2021-01-25 NOTE — Patient Instructions (Addendum)
I increased your trazodone dose per pill to be 100 mg (ONLY take one now).   Only take one farxiga a day. It does not work any better at higher doses and it can hurt liver/kidney.   We will call you with lab results.   Next appt in 3 months.     Diabetes Mellitus and Exercise Exercising regularly is important for overall health, especially for people who have diabetes mellitus. Exercising is not only about losing weight. It has many other health benefits, such as increasing muscle strength and bone density and reducing body fat and stress. This leads to improved fitness, flexibility, and endurance, all of which result in better overall health. What are the benefits of exercise if I have diabetes? Exercise has many benefits for people with diabetes. They include:  Helping to lower and control blood sugar (glucose).  Helping the body to respond better to the hormone insulin by improving insulin sensitivity.  Reducing how much insulin the body needs.  Lowering the risk for heart disease by: ? Lowering "bad" cholesterol and triglyceride levels. ? Increasing "good" cholesterol levels. ? Lowering blood pressure. ? Lowering blood glucose levels. What is my activity plan? Your health care provider or certified diabetes educator can help you make a plan for the type and frequency of exercise that works for you. This is called your activity plan. Be sure to:  Get at least 150 minutes of medium-intensity or high-intensity exercise each week. Exercises may include brisk walking, biking, or water aerobics.  Do stretching and strengthening exercises, such as yoga or weight lifting, at least 2 times a week.  Spread out your activity over at least 3 days of the week.  Get some form of physical activity each day. ? Do not go more than 2 days in a row without some kind of physical activity. ? Avoid being inactive for more than 90 minutes at a time. Take frequent breaks to walk or  stretch.  Choose exercises or activities that you enjoy. Set realistic goals.  Start slowly and gradually increase your exercise intensity over time.   How do I manage my diabetes during exercise? Monitor your blood glucose  Check your blood glucose before and after exercising. If your blood glucose is: ? 240 mg/dL (25.4 mmol/L) or higher before you exercise, check your urine for ketones. These are chemicals created by the liver. If you have ketones in your urine, do not exercise until your blood glucose returns to normal. ? 100 mg/dL (5.6 mmol/L) or lower, eat a snack containing 15-20 grams of carbohydrate. Check your blood glucose 15 minutes after the snack to make sure that your glucose level is above 100 mg/dL (5.6 mmol/L) before you start your exercise.  Know the symptoms of low blood glucose (hypoglycemia) and how to treat it. Your risk for hypoglycemia increases during and after exercise. Follow these tips and your health care provider's instructions  Keep a carbohydrate snack that is fast-acting for use before, during, and after exercise to help prevent or treat hypoglycemia.  Avoid injecting insulin into areas of the body that are going to be exercised. For example, avoid injecting insulin into: ? Your arms, when you are about to play tennis. ? Your legs, when you are about to go jogging.  Keep records of your exercise habits. Doing this can help you and your health care provider adjust your diabetes management plan as needed. Write down: ? Food that you eat before and after you exercise. ? Blood  glucose levels before and after you exercise. ? The type and amount of exercise you have done.  Work with your health care provider when you start a new exercise or activity. He or she may need to: ? Make sure that the activity is safe for you. ? Adjust your insulin, other medicines, and food that you eat.  Drink plenty of water while you exercise. This prevents loss of water  (dehydration) and problems caused by a lot of heat in the body (heat stroke).   Where to find more information  American Diabetes Association: www.diabetes.org Summary  Exercising regularly is important for overall health, especially for people who have diabetes mellitus.  Exercising has many health benefits. It increases muscle strength and bone density and reduces body fat and stress. It also lowers and controls blood glucose.  Your health care provider or certified diabetes educator can help you make an activity plan for the type and frequency of exercise that works for you.  Work with your health care provider to make sure any new activity is safe for you. Also work with your health care provider to adjust your insulin, other medicines, and the food you eat. This information is not intended to replace advice given to you by your health care provider. Make sure you discuss any questions you have with your health care provider. Document Revised: 08/12/2019 Document Reviewed: 08/12/2019 Elsevier Patient Education  2021 [employer].

## 2021-01-25 NOTE — Progress Notes (Signed)
This visit occurred during the SARS-CoV-2 public health emergency.  Safety protocols were in place, including screening questions prior to the visit, additional usage of staff PPE, and extensive cleaning of exam room while observing appropriate contact time as indicated for disinfecting solutions.    [name] , 13-Mar-1956, 65 y.o., male MRN: [id number] Patient Care Team    Relationship Specialty Notifications Start End  Ma Hillock, DO PCP - General Family Medicine  11/29/18   Lelon Perla, MD PCP - Cardiology Cardiology  08/07/20   Bernarda Caffey, MD Consulting Physician Ophthalmology  01/09/19     Chief Complaint  Patient presents with  . Follow-up    CMC; pt is not fasting     Subjective: Pt presents for an OV for cmc Depression/anxiety/insomnia: Patient reports he is doing well on Valium 4 times daily and trazodone he has had to increase from 50 mg to 100 mg nightly.    He had been on BuSpar as well which he originally thought was helpful, but then he felt that it was not as helpful.  [place] controlled substance database reviewed 09/23/2020. Original note: He states he has a rather routine day. He does not have much social contact in a day. He lives by himself. He reports that he always feels like he "stays on edge. "He reports when he wakes up he is already feeling on edge and he will take a Valium at that time. He then has breakfast and goes on a 4 mile walk daily. For lunch he starts to feel his anxiety sudden and he becomes panicked. He states when he feels this way he will break out in a sweat, become very fearful and scared. He states he commonly fears there is someone in the house with a gun. He goes to his bedroom gets in his bed.He states the fear is so intense and he just cannot control it. He denies any known trauma or event experienced in his life that would create such a fear. He states he did go see a counselor/psychiatrist at [facility] in the  past6 months, and he did not feel that was helpful for him. Denies any SI or HI.  Diabetes/overweight: Patient reports he has been a diabetic since 1998. He reports  compliance with Janumet/Metformin 1000-100 mg daily, glipizide 10 mg twice daily, Farxiga 10 mg daily. Patient denies dizziness, hyperglycemic or hypoglycemic events. Patient denies numbness, tingling in the extremities or nonhealing wounds of feet.  He does admit he has been doubling up on his [place] at times.  He has joined the [employer] and started his first workout today. PNA series:Completed after 63 Flu shot:completed day 09/23/2020. Foot exam:Completed 05/11/2020-referral to podiatry.  Eye exam:Completed 09/23/2020-[facility]  A1c:10.4>>>9.4>>9.7>>11.8>>8.5 >>> 6.5 >>>7.1>7.6>7.7> 8.7 today.  Hypertension/CAD/S/p Stent/prior MI: Pt has a significant cardiac history of "anterior MI in 2004 with stents was LAD and then perhaps stents to branch vessel 2008(by DR. Renaldo-cardio note 2015)" Patient reports compliance with lisinopril-HCTZ 20-25 mg daily.   He is compliant with baby aspirin 81 mg daily.  Plavix was DC'd by cards.  His statin was increased by cardiology to Crestor 40 mg daily.  Patient reports compliance with metoprolol 25 mg daily Diet:Low-sodium Exercise:Routine exercise daily UD:[id number], hyperlipidemia, diabetes, overweight, family history,CAD, MI  Neck pain/back pain: Patient states he has been less active he is have to take more naproxen.  He has finally got back into the gym. Prior note: Patient has had recurrent posterior neck  pain and headache.  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  He states he found an old prescription of naproxen and restarted it the last time he had symptoms.  He states it worked well for him.  He is wondering if we could get refills for him today.  In the past he has stated he could not tolerate NSAIDs.    Erectile  dysfunction: (Documentation only) patient would like to start a medication for erectile dysfunction.  He states he seen a urologist many years ago and does not desire to see a urologist again.  Patient has a significant medical history of blood pressure with systolic less than 919 and history of CAD and MI.   Depression screen [facility] 2/9 09/23/2020 02/13/2020 06/04/2019 02/12/2019 11/29/2018  Decreased Interest 0 0 3 0 3  Down, Depressed, Hopeless 0 '1 3 3 3  ' PHQ - 2 Score 0 '1 6 3 6  ' Altered sleeping - - 3 0 3  Tired, decreased energy - - 3 0 3  Change in appetite - - 3 0 3  Feeling bad or failure about yourself  - - '1 1 3  ' Trouble concentrating - - 0 0 3  Moving slowly or fidgety/restless - - 0 0 2  Suicidal thoughts - - 0 0 0  PHQ-9 Score - - '16 4 23  ' Difficult doing work/chores - - Very difficult Not difficult at all -    Allergies  Allergen Reactions  . Gabapentin Other (See Comments)    Paranoia/hallucinations  . Hydrocodone Other (See Comments)    PT reports  Confusion when tacking Hydrocodone  . Nsaids     History of heart disease, CAD with history of MI, CABG status post stent  . Prednisone     Paranoia  . Tramadol   . 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 Alcohol addiction ([place])   . Anxiety   . Arthritis   . Bulging of lumbar intervertebral disc 2017   MRI: L3-4, L4-5 and L5-S1- facet hypertrophy and formainal stenosis  . CAD (coronary artery disease)   . Coronary artery disease with hx of myocardial infarct w/o hx of CABG 2004  . Depression   . Dextrocardia   . Diabetes ([place])   . Heart attack [facility]) 2004   Stent placement 2008  . Hyperlipidemia   . Hypertension   . Insomnia     Past Surgical History:  Procedure Laterality Date  . CORONARY ANGIOPLASTY WITH STENT PLACEMENT  2008   Family History  Problem Relation Age of Onset  . Heart attack Mother   . Heart disease Mother   . Diabetes Mother   . Arthritis Mother   . Hyperlipidemia Mother   . Hypertension Mother   . Breast cancer Mother   . Heart attack Father   . Heart disease Father   . Alcohol abuse Father   . Early death Father   . Arthritis Sister   . Heart disease Sister   . Hyperlipidemia Sister   . Asthma Sister   . COPD Sister   . Diabetes Sister   .  Alcohol abuse Sister    Allergies as of 01/25/2021      Reactions   Gabapentin Other (See Comments)   Paranoia/hallucinations   Hydrocodone Other (See Comments)   PT reports  Confusion when tacking Hydrocodone   Nsaids    History of heart disease, CAD with history of MI, CABG status post stent   Prednisone    Paranoia   Tramadol    Penicillins Other (See Comments)   Childhood unsure if still allgeric      Medication List       Accurate as of January 25, 2021  4:18 PM. If you have any questions, ask your nurse or doctor.        Accu-Chek FastClix Lancets Misc   Accu-Chek Guide test strip Generic drug: glucose blood Use to check sugars up to three times daily DX11.9   aspirin EC 81 MG tablet Take 1 tablet (81 mg total) by mouth daily.   blood glucose meter kit and supplies Kit Dispense based on patient and insurance preference. Use up to four times daily as directed. DX E11.9   dapagliflozin propanediol 10 MG Tabs tablet Commonly known as: Farxiga TAKE 1 TABLET BY MOUTH  DAILY BEFORE BREAKFAST   diazepam 5 MG tablet Commonly known as: VALIUM Take 1 tablet (5 mg total) by mouth every 6 (six) hours as needed for anxiety.   glipiZIDE 10 MG tablet Commonly known as: GLUCOTROL TAKE  (1)  TABLET TWICE A DAY.   lisinopril-hydrochlorothiazide 20-25 MG tablet Commonly known as: ZESTORETIC Take 1 tablet by mouth daily.    metoprolol succinate 25 MG 24 hr tablet Commonly known as: TOPROL-XL Take 1 tablet (25 mg total) by mouth daily.   naproxen 500 MG tablet Commonly known as: NAPROSYN Every 12 hours with food as needed only   nitroGLYCERIN 0.4 MG SL tablet Commonly known as: NITROSTAT DISSOLVE 1 TABLET UNDER THE TONGUE EVERY 5 MINUTES AS  NEEDED FOR CHEST PAIN. MAX  OF 3 TABLETS IN 15 MINUTES. CALL 911 IF PAIN PERSISTS.   omega-3 acid ethyl esters 1 g capsule Commonly known as: LOVAZA TAKE 1 CAPSULE BY MOUTH  DAILY   rosuvastatin 40 MG tablet Commonly known as: CRESTOR Take 1 tablet (40 mg total) by mouth daily.   SitaGLIPtin-MetFORMIN HCl [id number] MG Tb24 Take 1 tablet by mouth daily.   traZODone 100 MG tablet Commonly known as: DESYREL Take 1 tablet (100 mg total) by mouth at bedtime as needed for sleep. What changed:   medication strength  how much to take Changed by: Howard Pouch, DO       All past medical history, surgical history, allergies, family history, immunizations andmedications were updated in the EMR today and reviewed under the history and medication portions of their EMR.     ROS: Negative, with the exception of above mentioned in HPI   Objective:  BP (!) 105/55   Pulse (!) 54   Temp 98.3 F (36.8 C) (Oral)   Ht '5\' 11"'  (1.803 m)   Wt 213 lb (96.6 kg)   SpO2 96%   BMI 29.71 kg/m  Body mass index is 29.71 kg/m. Gen: Afebrile. No acute distress.  Nontoxic, mildly overweight male. HENT: AT. [place].  Eyes:Pupils Equal Round Reactive to light, Extraocular movements intact,  Conjunctiva without redness, discharge or icterus. Neck/lymp/endocrine: Supple, no lymphadenopathy, no thyromegaly CV: RRR no murmur, no edema, +2/4 P posterior tibialis pulses Chest: CTAB, no wheeze or crackles Skin: No rashes, purpura or petechiae.  Neuro:  Normal gait. PERLA. EOMi. Alert. Oriented x3 Psych: Normal affect, dress and demeanor. Normal speech. Normal thought content and  judgment.   No exam data present No results found. Results for orders placed or performed in visit on 01/25/21 (from the past 24 hour(s))  POCT HgB A1C     Status: Abnormal   Collection Time: 01/25/21  2:04 PM  Result Value Ref Range   Hemoglobin A1C 8.7 (A) 4.0 - 5.6 %   HbA1c POC (<> result, manual entry) 8.7 4.0 - 5.6 %   HbA1c, POC (prediabetic range) 8.7 (A) 5.7 - 6.4 %   HbA1c, POC (controlled diabetic range) 8.7 (A) 0.0 - 7.0 %    Assessment/Plan: EVERARD INTERRANTE is a 65 y.o. male present for OV for   Anxiety/depression/insomnia: Stable. TSH collected today Has a history of significant depression with anxiety and paranoid features. Continue Valium Increase trazodone to 100 mg daily Meds tried in the past: BuSpar patient reports it was not effective.  Paxil patient reports made him more anxious. -[place] controlled substance databasereviewed  01/25/21. Patient aware he needs a face-to-face appointment in order for any refills to be processed -Contract signed -Offered referral to counseling or psychiatry;he declined. Follow-up required every 5.5 months  Diabetes, uncontrolled/diabetic peripheral neuropathy/mild nonproliferative diabetic retinopathy of both eyes/overweight: inCreasing A1c with sedentary lifestyle.  He has rejoined the gym and had his first workout today.  Grass cutting season also starts within the next couple weeks. -Continue Janumet [phone] daily -Continue glipizide 10 mg BID.   -Continuefarixga10 mg daily> he was strongly discouraged from increasing dose on his own. CBC, BMP, TSH and lipids collected today. Patient is aware if sugars continue to rise we would need to start an insulin next visit. PNA series:Completed after 63 Flu shot:completed day 09/23/2020. Foot exam:Completed 05/11/2020-referral to podiatry.  Eye exam:Completed 09/23/2020-[facility]  A1c:10.4>>>9.4>>9.7>>11.8>>8.5 >>> 6.5 >>>7.1>7.6>7.7>8.7  today.  Hypertension/CAD/S/p Stent/prior MI/hypercholesteremia: Stable Pt has a significant cardiac history of "anterior MI in 2004 with stents was LAD and then stents to a branch vessel 2008(by DR. Renaldo- cardio note 2015)" -  He has been counseled on multiple occasions> we will only supply enough blood pressure medication to take as directed and he is directed to only take one lisinopril-HCTZ tabs daily.  He has become argumentative and angry when he runs out of his medications early and his pharmacy does not refill them when he wants them-it has been explained to him it is because it is too early for them to do so since he is not taking his medications as directed. -continue  lisinopril-HCTZ 20-25 mg daily -continue metoprolol 25 mg daily - continue aspirin 81 mg daily. Cardiology told him he no longer needed the Plavix 75 mg daily -continue Crestor 40 mg daily Diet:Low-sodium Exercise:Routine exercise daily CBC, CMP, TSH and lipids collected today - now established with cardiology as well for routine follow up.    Reviewed expectations re: course of current medical issues.  Discussed self-management of symptoms.  Outlined signs and symptoms indicating need for more acute intervention.  Patient verbalized understanding and all questions were answered.  Patient received an After-Visit Summary.    Orders Placed This Encounter  Procedures  . Basic Metabolic Panel (BMET)  . TSH  . CBC  . POCT HgB A1C   Meds ordered this encounter  Medications  . traZODone (DESYREL) 100 MG tablet    Sig: Take 1 tablet (100 mg total) by mouth at bedtime as needed for sleep.  Dispense:  90 tablet    Refill:  1  . SitaGLIPtin-MetFORMIN HCl [id number] MG TB24    Sig: Take 1 tablet by mouth daily.    Dispense:  90 tablet    Refill:  1  . metoprolol succinate (TOPROL-XL) 25 MG 24 hr tablet    Sig: Take 1 tablet (25 mg total) by mouth daily.    Dispense:  90 tablet    Refill:  1  .  glipiZIDE (GLUCOTROL) 10 MG tablet    Sig: TAKE  (1)  TABLET TWICE A DAY.    Dispense:  180 tablet    Refill:  1  . rosuvastatin (CRESTOR) 40 MG tablet    Sig: Take 1 tablet (40 mg total) by mouth daily.    Dispense:  90 tablet    Refill:  3    Hold until pt request  . naproxen (NAPROSYN) 500 MG tablet    Sig: Every 12 hours with food as needed only    Dispense:  180 tablet    Refill:  1  . lisinopril-hydrochlorothiazide (ZESTORETIC) 20-25 MG tablet    Sig: Take 1 tablet by mouth daily.    Dispense:  90 tablet    Refill:  1    Requesting 1 year supply> no  . dapagliflozin propanediol (FARXIGA) 10 MG TABS tablet    Sig: TAKE 1 TABLET BY MOUTH  DAILY BEFORE BREAKFAST    Dispense:  90 tablet    Refill:  1    Requesting 1 year supply> no  . diazepam (VALIUM) 5 MG tablet    Sig: Take 1 tablet (5 mg total) by mouth every 6 (six) hours as needed for anxiety.    Dispense:  120 tablet    Refill:  5   Referral Orders  No referral(s) requested today     Note is dictated utilizing voice recognition software. Although note has been proof read prior to signing, occasional typographical errors still can be missed. If any questions arise, please do not hesitate to call for verification.   electronically signed by:  Howard Pouch, DO  [place]

## 2021-01-26 ENCOUNTER — Telehealth: Payer: Self-pay

## 2021-01-26 ENCOUNTER — Other Ambulatory Visit: Payer: Self-pay | Admitting: Family Medicine

## 2021-01-26 NOTE — Telephone Encounter (Signed)
Patient returning call regarding results  Please call patient back [phone]

## 2021-01-27 ENCOUNTER — Other Ambulatory Visit: Payer: Self-pay | Admitting: Family Medicine

## 2021-01-27 NOTE — Telephone Encounter (Signed)
Spoke with pt regarding labs and instructions.   

## 2021-02-04 ENCOUNTER — Other Ambulatory Visit: Payer: Self-pay | Admitting: Family Medicine

## 2021-02-04 DIAGNOSIS — E118 Type 2 diabetes mellitus with unspecified complications: Secondary | ICD-10-CM

## 2021-02-04 DIAGNOSIS — E119 Type 2 diabetes mellitus without complications: Secondary | ICD-10-CM

## 2021-02-04 DIAGNOSIS — I10 Essential (primary) hypertension: Secondary | ICD-10-CM

## 2021-02-05 ENCOUNTER — Other Ambulatory Visit: Payer: Self-pay | Admitting: Family Medicine

## 2021-02-05 DIAGNOSIS — E118 Type 2 diabetes mellitus with unspecified complications: Secondary | ICD-10-CM

## 2021-02-05 DIAGNOSIS — I10 Essential (primary) hypertension: Secondary | ICD-10-CM

## 2021-02-05 DIAGNOSIS — E119 Type 2 diabetes mellitus without complications: Secondary | ICD-10-CM

## 2021-02-07 ENCOUNTER — Other Ambulatory Visit: Payer: Self-pay | Admitting: Family Medicine

## 2021-02-09 ENCOUNTER — Other Ambulatory Visit: Payer: Self-pay | Admitting: Family Medicine

## 2021-02-09 DIAGNOSIS — I10 Essential (primary) hypertension: Secondary | ICD-10-CM

## 2021-02-09 DIAGNOSIS — E119 Type 2 diabetes mellitus without complications: Secondary | ICD-10-CM

## 2021-02-09 DIAGNOSIS — E118 Type 2 diabetes mellitus with unspecified complications: Secondary | ICD-10-CM

## 2021-02-10 ENCOUNTER — Other Ambulatory Visit: Payer: Self-pay | Admitting: Family Medicine

## 2021-02-11 ENCOUNTER — Other Ambulatory Visit: Payer: Self-pay | Admitting: Family Medicine

## 2021-02-16 ENCOUNTER — Telehealth: Payer: Self-pay | Admitting: Family Medicine

## 2021-02-16 NOTE — Telephone Encounter (Signed)
Chart note: Patient called very upset that [facility] is calling him multiple times per day trying to get his patronage back. Patient states he had all his medications changed to [facility]. Patient wanted our office to have [employer] stop calling him. I informed patient that we do not control that company. Patient asked how the h**l was he supposed to get them to stop calling him. I suggested he tell them to stop. Patient stated he is tired of this s**t and asked if he needed to block them. I told him if that's what he felt would work, then maybe he should do that. Patient then made several exasperated noised and hung up.

## 2021-02-16 NOTE — Telephone Encounter (Signed)
FYI

## 2021-02-19 NOTE — Progress Notes (Signed)
Virtual Visit via Telephone Note   This visit type was conducted due to national recommendations for restrictions regarding the COVID-19 Pandemic (e.g. social distancing) in an effort to limit this patient's exposure and mitigate transmission in our community.  Due to his co-morbid illnesses, this patient is at least at moderate risk for complications without adequate follow up.  This format is felt to be most appropriate for this patient at this time.  The patient did not have access to video technology/had technical difficulties with video requiring transitioning to audio format only (telephone).  All issues noted in this document were discussed and addressed.  No physical exam could be performed with this format.  Please refer to the patient's chart for his  consent to telehealth for [facility].   Date:  02/23/2021   ID:  [name], DOB 02-May-1956, MRN [id number]  Patient Location:Home Provider Location: office  PCP:  Ma Hillock, DO  Cardiologist:  Dr Stanford Breed  Evaluation Performed:  Follow-Up Visit  Chief Complaint:  FU CAD  History of Present Illness:    FU CAD. Patient has had previous PCI of his LAD in 2004 in setting of MI. Patient had PCI of RCA in 2008. Follow-up catheterization in 2008 showed patent stents.Nuclearstudy July 2018 showed LAD infarct but no ischemia. Echo April 2021 showed ejection fraction 45 to 50% and mild aortic stenosis with mean gradient 10 mmHg. Since last seen,the patient has dyspnea with more extreme activities but not with routine activities. It is relieved with rest. It is not associated with chest pain. There is no orthopnea, PND or pedal edema. There is no syncope or palpitations. There is no exertional chest pain.  The patient does not have symptoms concerning for COVID-19 infection (fever, chills, cough, or new shortness of breath).    Past Medical History:  Diagnosis Date  . Alcohol addiction ([place])   . Anxiety   . Arthritis   .  Bulging of lumbar intervertebral disc 2017   MRI: L3-4, L4-5 and L5-S1- facet hypertrophy and formainal stenosis  . CAD (coronary artery disease)   . Coronary artery disease with hx of myocardial infarct w/o hx of CABG 2004  . Depression   . Dextrocardia   . Diabetes ([place])   . Heart attack [facility]) 2004   Stent placement 2008  . Hyperlipidemia   . Hypertension   . Insomnia    Past Surgical History:  Procedure Laterality Date  . CORONARY ANGIOPLASTY WITH STENT PLACEMENT  2008     Current Meds  Medication Sig  . Accu-Chek FastClix Lancets MISC   . aspirin EC 81 MG tablet Take 1 tablet (81 mg total) by mouth daily.  . blood glucose meter kit and supplies KIT Dispense based on patient and insurance preference. Use up to four times daily as directed. DX E11.9  . dapagliflozin propanediol (FARXIGA) 10 MG TABS tablet TAKE 1 TABLET BY MOUTH  DAILY BEFORE BREAKFAST  . diazepam (VALIUM) 5 MG tablet Take 1 tablet (5 mg total) by mouth every 6 (six) hours as needed for anxiety.  Marland Kitchen glipiZIDE (GLUCOTROL) 10 MG tablet Take 10 mg by mouth daily before breakfast.  . glucose blood (ACCU-CHEK GUIDE) test strip Use to check sugars up to three times daily DX11.9  . lisinopril-hydrochlorothiazide (ZESTORETIC) 20-25 MG tablet Take 1 tablet by mouth daily.  . metoprolol succinate (TOPROL-XL) 25 MG 24 hr tablet Take 1 tablet (25 mg total) by mouth daily.  . naproxen (NAPROSYN) 500 MG tablet  Every 12 hours with food as needed only  . nitroGLYCERIN (NITROSTAT) 0.4 MG SL tablet DISSOLVE 1 TABLET UNDER THE TONGUE EVERY 5 MINUTES AS  NEEDED FOR CHEST PAIN. MAX  OF 3 TABLETS IN 15 MINUTES. CALL 911 IF PAIN PERSISTS.  Marland Kitchen omega-3 acid ethyl esters (LOVAZA) 1 g capsule TAKE 1 CAPSULE BY MOUTH  DAILY  . rosuvastatin (CRESTOR) 40 MG tablet Take 1 tablet (40 mg total) by mouth daily.  Marland Kitchen SITagliptin-metFORMIN HCl (JANUMET PO) Take 1 tablet by mouth daily.  . traZODone (DESYREL) 100 MG tablet Take 1 tablet (100 mg total) by  mouth at bedtime as needed for sleep.  . [DISCONTINUED] glipiZIDE (GLUCOTROL) 10 MG tablet TAKE  (1)  TABLET TWICE A DAY. (Patient taking differently: Take by mouth daily before breakfast.)     Allergies:   Gabapentin, Hydrocodone, Nsaids, Prednisone, Tramadol, and Penicillins   Social History   Tobacco Use  . Smoking status: Never Smoker  . Smokeless tobacco: Never Used  Vaping Use  . Vaping Use: Never used  Substance Use Topics  . Alcohol use: Yes    Alcohol/week: 0.0 standard drinks    Comment: Occasional  . Drug use: Not Currently    Types: Cocaine, Marijuana    Comment: Former     Family Hx: The patient's family history includes Alcohol abuse in his father and sister; Arthritis in his mother and sister; Asthma in his sister; Breast cancer in his mother; COPD in his sister; Diabetes in his mother and sister; Early death in his father; Heart attack in his father and mother; Heart disease in his father, mother, and sister; Hyperlipidemia in his mother and sister; Hypertension in his mother.  ROS:   Please see the history of present illness.    No Fever, chills  or productive cough All other systems reviewed and are negative.  Recent Labs: 08/07/2020: ALT 23 01/25/2021: BUN 22; Creatinine, Ser 1.11; Hemoglobin 14.7; Platelets 190.0; Potassium 4.0; Sodium 137; TSH 2.96   Recent Lipid Panel Lab Results  Component Value Date/Time   CHOL 113 08/07/2020 02:21 PM   TRIG 125 08/07/2020 02:21 PM   HDL 47 08/07/2020 02:21 PM   CHOLHDL 2.4 08/07/2020 02:21 PM   CHOLHDL 2 12/30/2019 12:22 PM   LDLCALC 44 08/07/2020 02:21 PM   LDLDIRECT 87.0 02/12/2019 09:17 AM    Wt Readings from Last 3 Encounters:  02/23/21 208 lb (94.3 kg)  01/25/21 213 lb (96.6 kg)  09/23/20 206 lb (93.4 kg)     Objective:    Vital Signs:  BP 119/62   Pulse 62   Ht _0  (1.778 m)   Wt 208 lb (94.3 kg)   BMI 29.84 kg/m    VITAL SIGNS:  reviewed NAD Answers questions appropriately Normal  affect Remainder of physical examination not performed (telehealth visit; coronavirus pandemic)  ASSESSMENT & PLAN:    1. Coronary artery disease-patient denies chest pain.  Continue aspirin and statin. 2. Hypertension-continue present medications and follow. 3. Hyperlipidemia-continue statin.  Check lipids and liver. 4. Mild aortic stenosis-he will need follow-up echoes in the future. 5. Chronic chest pain-symptoms are atypical.  Will not pursue further ischemia evaluation at this time.  COVID-19 Education: The importance of social distancing was discussed today.  Time:   Today, I have spent 15 minutes with the patient with telehealth technology discussing the above problems.     Medication Adjustments/Labs and Tests Ordered: Current medicines are reviewed at length with the patient today.  Concerns regarding  medicines are outlined above.   Tests Ordered: No orders of the defined types were placed in this encounter.   Medication Changes: No orders of the defined types were placed in this encounter.   Follow Up:  In Person in 1 year(s)  Signed, Kirk Ruths, MD  02/23/2021 7:54 AM    [place]

## 2021-02-23 ENCOUNTER — Encounter: Payer: Self-pay | Admitting: Cardiology

## 2021-02-23 ENCOUNTER — Telehealth (INDEPENDENT_AMBULATORY_CARE_PROVIDER_SITE_OTHER): Payer: Medicare Other | Admitting: Cardiology

## 2021-02-23 VITALS — BP 119/62 | HR 62 | Ht 70.0 in | Wt 208.0 lb

## 2021-02-23 DIAGNOSIS — I2510 Atherosclerotic heart disease of native coronary artery without angina pectoris: Secondary | ICD-10-CM

## 2021-02-23 DIAGNOSIS — E7800 Pure hypercholesterolemia, unspecified: Secondary | ICD-10-CM

## 2021-02-23 DIAGNOSIS — R072 Precordial pain: Secondary | ICD-10-CM

## 2021-02-23 DIAGNOSIS — I350 Nonrheumatic aortic (valve) stenosis: Secondary | ICD-10-CM | POA: Diagnosis not present

## 2021-02-23 DIAGNOSIS — I10 Essential (primary) hypertension: Secondary | ICD-10-CM | POA: Diagnosis not present

## 2021-02-23 NOTE — Patient Instructions (Signed)

## 2021-03-02 ENCOUNTER — Telehealth: Payer: Self-pay | Admitting: Family Medicine

## 2021-03-02 MED ORDER — OMEGA-3-ACID ETHYL ESTERS 1 G PO CAPS: 1.0000 | ORAL_CAPSULE | Freq: Every day | ORAL | 2 refills | Status: DC

## 2021-03-02 NOTE — Telephone Encounter (Signed)
Patient requesting we send RX for Omega-3 to his new pharmacy: [facility]. He states they were able to transfer his other meds but were unable to get this RX from [place].

## 2021-03-02 NOTE — Telephone Encounter (Signed)
rx sent

## 2021-04-05 ENCOUNTER — Telehealth: Payer: Self-pay | Admitting: *Deleted

## 2021-04-05 NOTE — Telephone Encounter (Signed)
Patient will go get tested.  Advised to get the OTC things but he stated that he was on Robitussin cold and flu.  He will call call us with results.

## 2021-04-05 NOTE — Telephone Encounter (Signed)
I would recommend he schedule a covid test with a local pharmacy nearest to him. These are covid symptoms and treatment could be different if he is covid positive, as well as follow up. Since he is a diabetic he would be higher risk of potential complications or pneumonia IF covid.   For relief of symptoms- mucinex DM for congestion and cough and OTC headache relief.

## 2021-04-05 NOTE — Telephone Encounter (Signed)
Noted.  Glad to hear he is COVID-negative.

## 2021-04-05 NOTE — Telephone Encounter (Signed)
Pt called to let PCP know that his COVID test was negative.

## 2021-04-05 NOTE — Telephone Encounter (Signed)
Nurse triage line called over to ask if we had any appointments available for today for this patient.  Advised that we do have an opening and can transfer.  Spoke with patient and asks what he needed to be seen for and he stated that he has congestion in his head, cough, headache and a possible UTI (per patient "it is slacking up).  Advised him I can put him in for a virtual and he declined and wanted to see if you could recommend something for his congestion, cough, and headache from [facility].

## 2021-04-06 ENCOUNTER — Other Ambulatory Visit: Payer: Self-pay

## 2021-04-06 ENCOUNTER — Encounter: Payer: Self-pay | Admitting: Family Medicine

## 2021-04-06 ENCOUNTER — Ambulatory Visit (INDEPENDENT_AMBULATORY_CARE_PROVIDER_SITE_OTHER): Payer: Medicare Other | Admitting: Family Medicine

## 2021-04-06 VITALS — BP 112/62 | HR 63 | Temp 98.4°F | Wt 214.0 lb

## 2021-04-06 DIAGNOSIS — J329 Chronic sinusitis, unspecified: Secondary | ICD-10-CM | POA: Diagnosis not present

## 2021-04-06 DIAGNOSIS — R81 Glycosuria: Secondary | ICD-10-CM

## 2021-04-06 DIAGNOSIS — B9689 Other specified bacterial agents as the cause of diseases classified elsewhere: Secondary | ICD-10-CM | POA: Diagnosis not present

## 2021-04-06 DIAGNOSIS — R300 Dysuria: Secondary | ICD-10-CM

## 2021-04-06 LAB — POCT URINALYSIS DIPSTICK
Bilirubin, UA: NEGATIVE
Blood, UA: NEGATIVE
Glucose, UA: POSITIVE — AB
Ketones, UA: NEGATIVE
Leukocytes, UA: NEGATIVE
Nitrite, UA: NEGATIVE
Protein, UA: NEGATIVE
Spec Grav, UA: 1.025 (ref 1.010–1.025)
Urobilinogen, UA: 0.2 E.U./dL
pH, UA: 6 (ref 5.0–8.0)

## 2021-04-06 MED ORDER — LEVOFLOXACIN 500 MG PO TABS: 500.0000 mg | ORAL_TABLET | Freq: Every day | ORAL | 0 refills | Status: AC

## 2021-04-06 MED ORDER — GLIPIZIDE 10 MG PO TABS: 10.0000 mg | ORAL_TABLET | Freq: Every day | ORAL | 1 refills | Status: DC

## 2021-04-06 NOTE — Telephone Encounter (Signed)
Pt called again to let us know that his covid test came back negative. And that ha thinks he has a uti. Scheduled pt in acute slot for today

## 2021-04-06 NOTE — Progress Notes (Signed)
This visit occurred during the SARS-CoV-2 public health emergency.  Safety protocols were in place, including screening questions prior to the visit, additional usage of staff PPE, and extensive cleaning of exam room while observing appropriate contact time as indicated for disinfecting solutions.    [name] , 1956-06-10, 65 y.o., male MRN: [id number] Patient Care Team    Relationship Specialty Notifications Start End  Ma Hillock, DO PCP - General Family Medicine  11/29/18   Lelon Perla, MD PCP - Cardiology Cardiology  08/07/20   Bernarda Caffey, MD Consulting Physician Ophthalmology  01/09/19     Chief Complaint  Patient presents with  . Dysuria    Pt c/o urinary freq, dysuria x 1 week     Subjective: Pt presents for an OV with complaints of urinary frequency and mild burning with urination over the past 5 to 7 days.  He states he has had a upper respiratory infection with a headache, sinus pressure, postnasal drip and nasal congestion for about a week.  He has not been checking his sugars over this time he is a diabetic.  He had a COVID test completed yesterday which was negative.  He has been taking over-the-counter products to ease his symptoms.  He denies any fevers or chills, nausea or vomit.  Depression screen [facility] 2/9 09/23/2020 02/13/2020 06/04/2019 02/12/2019 11/29/2018  Decreased Interest 0 0 3 0 3  Down, Depressed, Hopeless 0 _0 PHQ - 2 Score 0 _1 Altered sleeping - - 3 0 3  Tired, decreased energy - - 3 0 3  Change in appetite - - 3 0 3  Feeling bad or failure about yourself  - - _2 Trouble concentrating - - 0 0 3  Moving slowly or fidgety/restless - - 0 0 2  Suicidal thoughts - - 0 0 0  PHQ-9 Score - - _3 Difficult doing work/chores - - Very difficult Not difficult at all -    Allergies  Allergen Reactions  . Gabapentin Other (See Comments)    Paranoia/hallucinations  . Hydrocodone Other (See Comments)    PT reports  Confusion  when tacking Hydrocodone  . Nsaids     History of heart disease, CAD with history of MI, CABG status post stent  . Prednisone     Paranoia  . Tramadol   . 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 Alcohol addiction ([place])   . Anxiety   . Arthritis   . Bulging of lumbar intervertebral disc 2017   MRI: L3-4, L4-5 and L5-S1- facet hypertrophy and formainal stenosis  . CAD (coronary artery disease)   . Coronary artery disease with hx of myocardial infarct w/o hx of CABG 2004  . Depression   . Dextrocardia   . Diabetes ([place])   . Heart attack [facility]) 2004   Stent placement 2008  . Hyperlipidemia   . Hypertension   . Insomnia    Past Surgical History:  Procedure Laterality Date  . CORONARY ANGIOPLASTY WITH STENT PLACEMENT  2008   Family History  Problem Relation Age  of Onset  . Heart attack Mother   . Heart disease Mother   . Diabetes Mother   . Arthritis Mother   . Hyperlipidemia Mother   . Hypertension Mother   . Breast cancer Mother   . Heart attack Father   . Heart disease Father   . Alcohol abuse Father   . Early death Father   . Arthritis Sister   . Heart disease Sister   . Hyperlipidemia Sister   . Asthma Sister   . COPD Sister   . Diabetes Sister   . Alcohol abuse Sister    Allergies as of 04/06/2021      Reactions   Gabapentin Other (See Comments)   Paranoia/hallucinations   Hydrocodone Other (See Comments)   PT reports  Confusion when tacking Hydrocodone   Nsaids    History of heart disease, CAD with history of MI, CABG status post stent   Prednisone    Paranoia   Tramadol    Penicillins Other (See Comments)   Childhood unsure if still allgeric      Medication  List       Accurate as of Apr 06, 2021  4:59 PM. If you have any questions, ask your nurse or doctor.        Accu-Chek FastClix Lancets Misc   Accu-Chek Guide test strip Generic drug: glucose blood Use to check sugars up to three times daily DX11.9   aspirin EC 81 MG tablet Take 1 tablet (81 mg total) by mouth daily.   blood glucose meter kit and supplies Kit Dispense based on patient and insurance preference. Use up to four times daily as directed. DX E11.9   dapagliflozin propanediol 10 MG Tabs tablet Commonly known as: Farxiga TAKE 1 TABLET BY MOUTH  DAILY BEFORE BREAKFAST   diazepam 5 MG tablet Commonly known as: VALIUM Take 1 tablet (5 mg total) by mouth every 6 (six) hours as needed for anxiety.   glipiZIDE 10 MG tablet Commonly known as: GLUCOTROL Take 1 tablet (10 mg total) by mouth daily before breakfast.   Janumet XR [phone] MG Tb24 Generic drug: SitaGLIPtin-MetFORMIN HCl Take 1 tablet by mouth daily. What changed: Another medication with the same name was removed. Continue taking this medication, and follow the directions you see here. Changed by: Howard Pouch, DO   levofloxacin 500 MG tablet Commonly known as: LEVAQUIN Take 1 tablet (500 mg total) by mouth daily for 7 days. Started by: Howard Pouch, DO   lisinopril-hydrochlorothiazide 20-25 MG tablet Commonly known as: ZESTORETIC Take 1 tablet by mouth daily.   metoprolol succinate 25 MG 24 hr tablet Commonly known as: TOPROL-XL Take 1 tablet (25 mg total) by mouth daily.   naproxen 500 MG tablet Commonly known as: NAPROSYN Every 12 hours with food as needed only   nitroGLYCERIN 0.4 MG SL tablet Commonly known as: NITROSTAT DISSOLVE 1 TABLET UNDER THE TONGUE EVERY 5 MINUTES AS  NEEDED FOR CHEST PAIN. MAX  OF 3 TABLETS IN 15 MINUTES. CALL 911 IF PAIN PERSISTS.   omega-3 acid ethyl esters 1 g capsule Commonly known as: LOVAZA Take 1 capsule (1 g total) by mouth daily.   rosuvastatin 40 MG  tablet Commonly known as: CRESTOR Take 1 tablet (40 mg total) by mouth daily.   traZODone 100 MG tablet Commonly known as: DESYREL Take 1 tablet (100 mg total) by mouth at bedtime as needed for sleep.       All past medical history, surgical history, allergies, family history,  immunizations andmedications were updated in the EMR today and reviewed under the history and medication portions of their EMR.     ROS: Negative, with the exception of above mentioned in HPI   Objective:  BP 112/62   Pulse 63   Temp 98.4 F (36.9 C) (Oral)   Wt 214 lb (97.1 kg)   SpO2 94%   BMI 30.71 kg/m  Body mass index is 30.71 kg/m. Gen: Afebrile. No acute distress. Nontoxic in appearance, well developed, well nourished.  HENT: AT. [place].  Eyes:Pupils Equal Round Reactive to light, Extraocular movements intact,  Conjunctiva without redness, discharge or icterus. Neck/lymp/endocrine: Supple, no lymphadenopathy CV: RRR, no edema Chest: CTAB, no wheeze or crackles. Good air movement, normal resp effort.  Abd: Soft. NTND. BS present.  No masses palpated. No rebound or guarding.  MSK: No CVA tenderness bilaterally Skin: No rashes, purpura or petechiae.  Neuro:  Normal gait. PERLA. EOMi. Alert. Oriented x3  Psych: Normal affect, dress and demeanor. Normal speech. Normal thought content and judgment.  No exam data present No results found. Results for orders placed or performed in visit on 04/06/21 (from the past 24 hour(s))  POCT Urinalysis Dipstick     Status: Abnormal   Collection Time: 04/06/21  1:49 PM  Result Value Ref Range   Color, UA Light Yellow    Clarity, UA Clear    Glucose, UA Positive (A) Negative   Bilirubin, UA Negative    Ketones, UA Negative    Spec Grav, UA 1.025 1.010 - 1.025   Blood, UA Negative    pH, UA 6.0 5.0 - 8.0   Protein, UA Negative Negative   Urobilinogen, UA 0.2 0.2 or 1.0 E.U./dL   Nitrite, UA Negative    Leukocytes, UA Negative Negative   Appearance Clear     Odor Yes     Assessment/Plan: [name] is a 65 y.o. male present for OV for  Dysuria/Glucosuria Point-of-care urinalysis does not appear infectious.  He denies any penile drainage or lesions.  We discussed the glucose in his urine, which may be irritating.  He admits he had not been checking his glucose last couple weeks because he has been sick. - POCT Urinalysis Dipstick - Urinalysis w microscopic + reflex cultur  Bacterial sinusitis Rest, hydrate.  mucinex (DM if cough), nettie pot or nasal saline.  Levaquin x7 days prescribed, take until completed.  Given his comorbid condition of uncontrolled diabetes and elevated sugars, elected to go ahead and treat with Levaquin which hopefully would also cover any UTI that may also be present. Patient understands glucose may be elevated secondary to illness.  He will monitor his sugars closely.  He is due for follow-up in 2-3 weeks for his chronic conditions and has this appointment scheduled.    Reviewed expectations re: course of current medical issues.  Discussed self-management of symptoms.  Outlined signs and symptoms indicating need for more acute intervention.  Patient verbalized understanding and all questions were answered.  Patient received an After-Visit Summary.    Orders Placed This Encounter  Procedures  . Urinalysis w microscopic + reflex cultur  . POCT Urinalysis Dipstick   Meds ordered this encounter  Medications  . levofloxacin (LEVAQUIN) 500 MG tablet    Sig: Take 1 tablet (500 mg total) by mouth daily for 7 days.    Dispense:  7 tablet    Refill:  0  . glipiZIDE (GLUCOTROL) 10 MG tablet    Sig: Take 1 tablet (10  mg total) by mouth daily before breakfast.    Dispense:  180 tablet    Refill:  1   Referral Orders  No referral(s) requested today     Note is dictated utilizing voice recognition software. Although note has been proof read prior to signing, occasional typographical errors still can be  missed. If any questions arise, please do not hesitate to call for verification.   electronically signed by:  Howard Pouch, DO  [place]

## 2021-04-06 NOTE — Patient Instructions (Addendum)
Start antibiotic today this will cover both your sinus infection and possible urinary infection.    If urine symptoms persists after treated then we need to see you back to find cause.

## 2021-04-07 LAB — URINALYSIS W MICROSCOPIC + REFLEX CULTURE
Bacteria, UA: NONE SEEN /HPF
Bilirubin Urine: NEGATIVE
Hgb urine dipstick: NEGATIVE
Hyaline Cast: NONE SEEN /LPF
Ketones, ur: NEGATIVE
Leukocyte Esterase: NEGATIVE
Nitrites, Initial: NEGATIVE
Protein, ur: NEGATIVE
RBC / HPF: NONE SEEN /HPF (ref 0–2)
Specific Gravity, Urine: 1.031 (ref 1.001–1.035)
Squamous Epithelial / HPF: NONE SEEN /HPF (ref <=5)
WBC, UA: NONE SEEN /HPF (ref 0–5)
pH: 6 (ref 5.0–8.0)

## 2021-04-07 LAB — NO CULTURE INDICATED

## 2021-04-15 ENCOUNTER — Telehealth: Payer: Self-pay

## 2021-04-15 MED ORDER — FLUCONAZOLE 150 MG PO TABS: ORAL_TABLET | ORAL | 0 refills | Status: DC

## 2021-04-15 NOTE — Telephone Encounter (Signed)
Please return patient's call: Please remind patient his urinalysis was normal except for sugar/glucose in the urine.   Antibiotic prescribed was mostly for his sinus infection so that he could get his illness under control.  With the thinking that his sinus illness was causing elevation in sugars, which is common.   Getting his sugar under control with the first step. Hydrate, drink plenty of water.  Lastly I called in 3 tabs of a medication called Diflucan.  This medication will treat if there is yeast infection of the bladder, which can occur when there is large amount of sugar in the urine. This is taken once a day, every other day for 3 doses.  (Example: Thursday, Saturday, Monday)  Lastly, please confirm with patient that he has had no unprotected sex prior to onset of symptoms.  He had denied any sexual activity during his appointment.  There are certain bacteria's that can create those symptoms, that would not show up on a urinalysis for UTI.  Therefore if he does admit to having unprotected sex prior to onset of symptoms, we would collect another urine and test for a different kind of infection.  If urinary symptoms persist after sugars are controlled and the above medication, then we would need to get him to a urologist which is a specialist that looks at the urinary tract.

## 2021-04-15 NOTE — Telephone Encounter (Signed)
Spoke with pt regarding urinary sx and providers instructions.

## 2021-04-15 NOTE — Telephone Encounter (Signed)
Patient states meds for UTI are not helping.  He wants to know if Dr. Claiborne Billings can prescribe him something else for it.  His next appt is not until Tues 5/24, he doesn't think he can wait that long.   Please call [phone].  Sent as priority only so Dr. Claiborne Billings can advise before she leaves today.  Thank you.

## 2021-04-16 ENCOUNTER — Other Ambulatory Visit: Payer: Self-pay | Admitting: Family Medicine

## 2021-04-16 DIAGNOSIS — I10 Essential (primary) hypertension: Secondary | ICD-10-CM

## 2021-04-20 ENCOUNTER — Ambulatory Visit (INDEPENDENT_AMBULATORY_CARE_PROVIDER_SITE_OTHER): Payer: Medicare Other | Admitting: Family Medicine

## 2021-04-20 ENCOUNTER — Other Ambulatory Visit: Payer: Self-pay

## 2021-04-20 ENCOUNTER — Encounter: Payer: Self-pay | Admitting: Family Medicine

## 2021-04-20 VITALS — BP 102/57 | HR 59 | Temp 98.4°F | Ht 70.0 in | Wt 211.0 lb

## 2021-04-20 DIAGNOSIS — E7800 Pure hypercholesterolemia, unspecified: Secondary | ICD-10-CM

## 2021-04-20 DIAGNOSIS — E1142 Type 2 diabetes mellitus with diabetic polyneuropathy: Secondary | ICD-10-CM | POA: Diagnosis not present

## 2021-04-20 DIAGNOSIS — I2510 Atherosclerotic heart disease of native coronary artery without angina pectoris: Secondary | ICD-10-CM | POA: Diagnosis not present

## 2021-04-20 DIAGNOSIS — G4700 Insomnia, unspecified: Secondary | ICD-10-CM

## 2021-04-20 DIAGNOSIS — I252 Old myocardial infarction: Secondary | ICD-10-CM

## 2021-04-20 DIAGNOSIS — E113293 Type 2 diabetes mellitus with mild nonproliferative diabetic retinopathy without macular edema, bilateral: Secondary | ICD-10-CM | POA: Diagnosis not present

## 2021-04-20 DIAGNOSIS — F411 Generalized anxiety disorder: Secondary | ICD-10-CM

## 2021-04-20 DIAGNOSIS — I10 Essential (primary) hypertension: Secondary | ICD-10-CM

## 2021-04-20 DIAGNOSIS — E119 Type 2 diabetes mellitus without complications: Secondary | ICD-10-CM

## 2021-04-20 LAB — POCT GLYCOSYLATED HEMOGLOBIN (HGB A1C)
HbA1c POC (<> result, manual entry): 8.8 % (ref 4.0–5.6)
HbA1c, POC (controlled diabetic range): 8.8 % — AB (ref 0.0–7.0)
HbA1c, POC (prediabetic range): 8.8 % — AB (ref 5.7–6.4)
Hemoglobin A1C: 8.8 % — AB (ref 4.0–5.6)

## 2021-04-20 MED ORDER — TRAZODONE HCL 100 MG PO TABS: 100.0000 mg | ORAL_TABLET | Freq: Every evening | ORAL | 1 refills | Status: DC | PRN

## 2021-04-20 MED ORDER — DAPAGLIFLOZIN PROPANEDIOL 10 MG PO TABS: ORAL_TABLET | ORAL | 1 refills | Status: DC

## 2021-04-20 MED ORDER — LISINOPRIL-HYDROCHLOROTHIAZIDE 20-25 MG PO TABS: 1.0000 | ORAL_TABLET | Freq: Every day | ORAL | 1 refills | Status: DC

## 2021-04-20 MED ORDER — LANTUS SOLOSTAR 100 UNIT/ML ~~LOC~~ SOPN: 10.0000 [IU] | PEN_INJECTOR | Freq: Every day | SUBCUTANEOUS | 1 refills | Status: DC

## 2021-04-20 MED ORDER — METOPROLOL SUCCINATE ER 25 MG PO TB24: 25.0000 mg | ORAL_TABLET | Freq: Every day | ORAL | 1 refills | Status: DC

## 2021-04-20 MED ORDER — DIAZEPAM 5 MG PO TABS
5.0000 mg | ORAL_TABLET | Freq: Four times a day (QID) | ORAL | 5 refills | Status: DC | PRN
Start: 2021-04-20 — End: 2021-08-11

## 2021-04-20 MED ORDER — GLIPIZIDE 10 MG PO TABS: 10.0000 mg | ORAL_TABLET | Freq: Every day | ORAL | 1 refills | Status: DC

## 2021-04-20 MED ORDER — NAPROXEN 500 MG PO TABS: ORAL_TABLET | ORAL | 1 refills | Status: DC

## 2021-04-20 MED ORDER — JANUMET XR 100-1000 MG PO TB24: 1.0000 | ORAL_TABLET | Freq: Every day | ORAL | 1 refills | Status: DC

## 2021-04-20 MED ORDER — ROSUVASTATIN CALCIUM 40 MG PO TABS: 40.0000 mg | ORAL_TABLET | Freq: Every day | ORAL | 3 refills | Status: DC

## 2021-04-20 NOTE — Progress Notes (Signed)
This visit occurred during the SARS-CoV-2 public health emergency.  Safety protocols were in place, including screening questions prior to the visit, additional usage of staff PPE, and extensive cleaning of exam room while observing appropriate contact time as indicated for disinfecting solutions.    [name] , [birth year], 65 y.o., male MRN: [id number] Patient Care Team    Relationship Specialty Notifications Start End  Ma Hillock, DO PCP - General Family Medicine  11/29/18   Lelon Perla, MD PCP - Cardiology Cardiology  08/07/20   Bernarda Caffey, MD Consulting Physician Ophthalmology  01/09/19     Chief Complaint  Patient presents with  . Follow-up    CMC; pt is not fasting  . Diabetes     Subjective: Pt presents for an OV for cmc Depression/anxiety/insomnia: Patient reports he is doing very well on Valium 4 times daily and trazodone 100 mg nightly.    He had been on BuSpar as well which he originally thought was helpful, but then he felt that it was not as helpful.  [place] controlled substance database reviewed 04/20/21. Original note: He states he has a rather routine day. He does not have much social contact in a day. He lives by himself. He reports that he always feels like he "stays on edge. "He reports when he wakes up he is already feeling on edge and he will take a Valium at that time. He then has breakfast and goes on a 4 mile walk daily. For lunch he starts to feel his anxiety sudden and he becomes panicked. He states when he feels this way he will break out in a sweat, become very fearful and scared. He states he commonly fears there is someone in the house with a gun. He goes to his bedroom gets in his bed.He states the fear is so intense and he just cannot control it. He denies any known trauma or event experienced in his life that would create such a fear. He states he did go see a counselor/psychiatrist at [facility] in the past6 months, and  he did not feel that was helpful for him. Denies any SI or HI.  Diabetes/overweight: Patient reports he has been a diabetic since 1998. He reports  compliance with Janumet/Metformin 1000-100 mg daily, glipizide 10 mg twice daily, Farxiga 10 mg daily. Patient denies dizziness, hyperglycemic or hypoglycemic events. Patient denies numbness, tingling in the extremities or nonhealing wounds of feet.  He does admit he has been doubling up on his [place] at times.  He has joined the [employer] and started his first workout today. PNA series:Completed after 63 Flu shot:completed day 09/23/2020. Foot exam:Completed 05/11/2020-referral to podiatry.  Eye exam:Completed 09/23/2020-[facility]  A1c:10.4>>>9.4>>9.7>>11.8>>8.5 >>> 6.5 >>>7.1>7.6>7.7> 8.7 today.  Hypertension/CAD/S/p Stent/prior MI: Pt has a significant cardiac history of "anterior MI in 2004 with stents was LAD and then perhaps stents to branch vessel 2008(by DR. Renaldo-cardio note 2015)" Patient reports compliance with lisinopril-HCTZ 20-25 mg daily.   He is compliant with baby aspirin 81 mg daily.  Plavix was DC'd by cards.  His statin was increased by cardiology to Crestor 40 mg daily.  Patient reports compliance with metoprolol 25 mg daily (MI) Diet:Low-sodium Exercise:Routine exercise daily BB:[id number], hyperlipidemia, diabetes, overweight, family history,CAD, MI  Neck pain/back pain: Patient reports he is using naproxen as needed only. Prior note: Patient has had recurrent posterior neck pain and headache.  This has been exacerbated when he is needing to ride his tractor to his  care of his land.  He states he found an old prescription of naproxen and restarted it the last time he had symptoms.  He states it worked well for him.  He is wondering if we could get refills for him today.  In the past he has stated he could not tolerate NSAIDs.    Erectile dysfunction: (Documentation only) patient would like to start a  medication for erectile dysfunction.  He states he seen a urologist many years ago and does not desire to see a urologist again.  Patient has a significant medical history of blood pressure with systolic less than 154 and history of CAD and MI.  He understands this would not make him a candidate.   Depression screen [facility] 2/9 09/23/2020 02/13/2020 06/04/2019 02/12/2019 11/29/2018  Decreased Interest 0 0 3 0 3  Down, Depressed, Hopeless 0 _0 PHQ - 2 Score 0 _1 Altered sleeping - - 3 0 3  Tired, decreased energy - - 3 0 3  Change in appetite - - 3 0 3  Feeling bad or failure about yourself  - - _2 Trouble concentrating - - 0 0 3  Moving slowly or fidgety/restless - - 0 0 2  Suicidal thoughts - - 0 0 0  PHQ-9 Score - - _3 Difficult doing work/chores - - Very difficult Not difficult at all -    Allergies  Allergen Reactions  . Gabapentin Other (See Comments)    Paranoia/hallucinations  . Hydrocodone Other (See Comments)    PT reports  Confusion when tacking Hydrocodone  . Nsaids     History of heart disease, CAD with history of MI, CABG status post stent  . Prednisone     Paranoia  . Tramadol   . 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 Alcohol addiction ([place])   . Anxiety   . Arthritis   . Bulging of lumbar intervertebral disc 2017   MRI: L3-4, L4-5 and L5-S1- facet hypertrophy and formainal stenosis  . CAD (coronary artery disease)   . Coronary artery disease with hx of myocardial infarct w/o hx of CABG 2004  . Depression   . Dextrocardia   . Diabetes ([place])   . Heart attack [facility]) 2004   Stent placement 2008  . Hyperlipidemia   . Hypertension   . Insomnia    Past  Surgical History:  Procedure Laterality Date  . CORONARY ANGIOPLASTY WITH STENT PLACEMENT  2008   Family History  Problem Relation Age of Onset  . Heart attack Mother   . Heart disease Mother   . Diabetes Mother   . Arthritis Mother   . Hyperlipidemia Mother   . Hypertension Mother   . Breast cancer Mother   . Heart attack Father   . Heart disease Father   . Alcohol abuse Father   . Early death Father   . Arthritis Sister   . Heart disease Sister   . Hyperlipidemia Sister   . Asthma Sister   . COPD Sister   . Diabetes Sister   . Alcohol abuse Sister  Allergies as of 04/20/2021      Reactions   Gabapentin Other (See Comments)   Paranoia/hallucinations   Hydrocodone Other (See Comments)   PT reports  Confusion when tacking Hydrocodone   Nsaids    History of heart disease, CAD with history of MI, CABG status post stent   Prednisone    Paranoia   Tramadol    Penicillins Other (See Comments)   Childhood unsure if still allgeric      Medication List       Accurate as of Apr 20, 2021  5:15 PM. If you have any questions, ask your nurse or doctor.        STOP taking these medications   fluconazole 150 MG tablet Commonly known as: DIFLUCAN Stopped by: Howard Pouch, DO     TAKE these medications   Accu-Chek FastClix Lancets Misc   Accu-Chek Guide test strip Generic drug: glucose blood Use to check sugars up to three times daily DX11.9   aspirin EC 81 MG tablet Take 1 tablet (81 mg total) by mouth daily. What changed: how much to take   blood glucose meter kit and supplies Kit Dispense based on patient and insurance preference. Use up to four times daily as directed. DX E11.9   dapagliflozin propanediol 10 MG Tabs tablet Commonly known as: Farxiga TAKE 1 TABLET BY MOUTH  DAILY BEFORE BREAKFAST   diazepam 5 MG tablet Commonly known as: VALIUM Take 1 tablet (5 mg total) by mouth every 6 (six) hours as needed for anxiety.   glipiZIDE 10 MG tablet Commonly  known as: GLUCOTROL Take 1 tablet (10 mg total) by mouth daily before breakfast.   Janumet XR [phone] MG Tb24 Generic drug: SitaGLIPtin-MetFORMIN HCl Take 1 tablet by mouth daily.   Lantus SoloStar 100 UNIT/ML Solostar Pen Generic drug: insulin glargine Inject 10 Units into the skin daily. Started by: Howard Pouch, DO   lisinopril-hydrochlorothiazide 20-25 MG tablet Commonly known as: ZESTORETIC Take 1 tablet by mouth daily.   metoprolol succinate 25 MG 24 hr tablet Commonly known as: TOPROL-XL Take 1 tablet (25 mg total) by mouth daily.   naproxen 500 MG tablet Commonly known as: NAPROSYN Every 12 hours with food as needed only   nitroGLYCERIN 0.4 MG SL tablet Commonly known as: NITROSTAT DISSOLVE 1 TABLET UNDER THE TONGUE EVERY 5 MINUTES AS  NEEDED FOR CHEST PAIN. MAX  OF 3 TABLETS IN 15 MINUTES. CALL 911 IF PAIN PERSISTS.   omega-3 acid ethyl esters 1 g capsule Commonly known as: LOVAZA Take 1 capsule (1 g total) by mouth daily.   rosuvastatin 40 MG tablet Commonly known as: CRESTOR Take 1 tablet (40 mg total) by mouth daily.   traZODone 100 MG tablet Commonly known as: DESYREL Take 1 tablet (100 mg total) by mouth at bedtime as needed for sleep.       All past medical history, surgical history, allergies, family history, immunizations andmedications were updated in the EMR today and reviewed under the history and medication portions of their EMR.     ROS: Negative, with the exception of above mentioned in HPI   Objective:  BP (!) 102/57   Pulse (!) 59   Temp 98.4 F (36.9 C) (Oral)   Ht 5' 10" (1.778 m)   Wt 211 lb (95.7 kg)   SpO2 95%   BMI 30.28 kg/m  Body mass index is 30.28 kg/m. Gen: Afebrile. No acute distress.  Nontoxic.  Pleasant male. HENT: AT. [place].  No cough.  Eyes:Pupils Equal Round Reactive to light, Extraocular movements intact,  Conjunctiva without redness, discharge or icterus. Neck/lymp/endocrine: Supple, no lymphadenopathy, no  thyromegaly CV: RRR 1/6 SM, no edema, +2/4 P posterior tibialis pulses Chest: CTAB, no wheeze or crackles Skin: No rashes, purpura or petechiae.  Neuro: Normal gait. PERLA. EOMi. Alert. Oriented x3  Psych: Normal affect, dress and demeanor. Normal speech. Normal thought content and judgment.   No exam data present No results found. Results for orders placed or performed in visit on 04/20/21 (from the past 24 hour(s))  POCT HgB A1C     Status: Abnormal   Collection Time: 04/20/21  2:42 PM  Result Value Ref Range   Hemoglobin A1C 8.8 (A) 4.0 - 5.6 %   HbA1c POC (<> result, manual entry) 8.8 4.0 - 5.6 %   HbA1c, POC (prediabetic range) 8.8 (A) 5.7 - 6.4 %   HbA1c, POC (controlled diabetic range) 8.8 (A) 0.0 - 7.0 %    Assessment/Plan: Cleofas C Wojtowicz is a 64 y.o. male present for OV for  Anxiety/depression/insomnia: Stable and well-controlled. Has a history of significant depression with anxiety and paranoid features. Continue Valium Continue trazodone to 100 mg daily Meds tried in the past: BuSpar patient reports it was not effective.  Paxil patient reports made him more anxious. -[place] controlled substance databasereviewed  04/20/21. Patient aware he needs a face-to-face appointment in order for any refills to be processed -Contract signed -Offered referral to counseling or psychiatry;he declined. Follow-up required every 5.5 months  Diabetes, uncontrolled/diabetic peripheral neuropathy/mild nonproliferative diabetic retinopathy of both eyes/overweight: inCreasing A1c with sedentary lifestyle.  Not being as active as she has in the past. -Continue janumet 100-1000 daily -Continue glipizide 10 mg BID.   -Continuefarixga10 mg daily -Start Lantus 10 units nightly.  Patient reports he is familiar with how to give insulin secondary to his family member. PNA series:Completed after 63 Flu shot:completed day 09/23/2020. Foot exam:Completed 05/11/2020-referral  to podiatry.  Eye exam:Completed 09/23/2020-[facility]  A1c:10.4>>>9.4>>9.7>>11.8>>8.5 >>> 6.5 >>>7.1>7.6>7.7>8.7> 8.8 today. Follow-up in 4 weeks-patient was encouraged to write down all his fasting blood sugars in the morning.  It is goal to get his blood sugars into normal range and then taper off glipizide if possible first.  Hypertension/CAD/S/p Stent/prior MI/hypercholesteremia: Stable Pt has a significant cardiac history of "anterior MI in 2004 with stents was LAD and then stents to a branch vessel 2008(by DR. Renaldo- cardio note 2015)" -  He has been counseled on multiple occasions> we will only supply enough blood pressure medication to take as directed and he is directed to only take one lisinopril-HCTZ tabs daily.  He has become argumentative and angry when he runs out of his medications early and his pharmacy does not refill them when he wants them-it has been explained to him it is because it is too early for them to do so since he is not taking his medications as directed. -Continue lisinopril-HCTZ 20-25 mg daily - Continue metoprolol 25 mg daily - continue aspirin 81 mg daily. Cardiology told him he no longer needed the Plavix 75 mg daily -Continue Crestor 40 mg daily Diet:Low-sodium Exercise:Routine exercise daily Labs due September - now established with cardiology as well for routine follow up.    Reviewed expectations re: course of current medical issues.  Discussed self-management of symptoms.  Outlined signs and symptoms indicating need for more acute intervention.  Patient verbalized understanding and all questions were answered.  Patient received an After-Visit Summary.    Orders Placed   This Encounter  Procedures  . POCT HgB A1C   Meds ordered this encounter  Medications  . insulin glargine (LANTUS SOLOSTAR) 100 UNIT/ML Solostar Pen    Sig: Inject 10 Units into the skin daily.    Dispense:  5 mL    Refill:  1    Please include needles  .  traZODone (DESYREL) 100 MG tablet    Sig: Take 1 tablet (100 mg total) by mouth at bedtime as needed for sleep.    Dispense:  90 tablet    Refill:  1  . rosuvastatin (CRESTOR) 40 MG tablet    Sig: Take 1 tablet (40 mg total) by mouth daily.    Dispense:  90 tablet    Refill:  3    Hold until pt request  . naproxen (NAPROSYN) 500 MG tablet    Sig: Every 12 hours with food as needed only    Dispense:  180 tablet    Refill:  1  . metoprolol succinate (TOPROL-XL) 25 MG 24 hr tablet    Sig: Take 1 tablet (25 mg total) by mouth daily.    Dispense:  90 tablet    Refill:  1  . lisinopril-hydrochlorothiazide (ZESTORETIC) 20-25 MG tablet    Sig: Take 1 tablet by mouth daily.    Dispense:  90 tablet    Refill:  1    Requesting 1 year supply> no  . glipiZIDE (GLUCOTROL) 10 MG tablet    Sig: Take 1 tablet (10 mg total) by mouth daily before breakfast.    Dispense:  180 tablet    Refill:  1  . dapagliflozin propanediol (FARXIGA) 10 MG TABS tablet    Sig: TAKE 1 TABLET BY MOUTH  DAILY BEFORE BREAKFAST    Dispense:  90 tablet    Refill:  1    Requesting 1 year supply> no  . JANUMET XR 100-1000 MG TB24    Sig: Take 1 tablet by mouth daily.    Dispense:  90 tablet    Refill:  1  . diazepam (VALIUM) 5 MG tablet    Sig: Take 1 tablet (5 mg total) by mouth every 6 (six) hours as needed for anxiety.    Dispense:  120 tablet    Refill:  5   Referral Orders  No referral(s) requested today     Note is dictated utilizing voice recognition software. Although note has been proof read prior to signing, occasional typographical errors still can be missed. If any questions arise, please do not hesitate to call for verification.   electronically signed by:  Renee Kuneff, DO  [facility] [place]     

## 2021-04-20 NOTE — Patient Instructions (Signed)
Start Lantus 10 units every night.  Continue other oral meds also.   Follow up in 4 weeks with fasting sugars written down for me.   Diabetes Mellitus and Nutrition, Adult When you have diabetes, or diabetes mellitus, it is very important to have healthy eating habits because your blood sugar (glucose) levels are greatly affected by what you eat and drink. Eating healthy foods in the right amounts, at about the same times every day, can help you:  Control your blood glucose.  Lower your risk of heart disease.  Improve your blood pressure.  Reach or maintain a healthy weight. What can affect my meal plan? Every person with diabetes is different, and each person has different needs for a meal plan. Your health care provider may recommend that you work with a dietitian to make a meal plan that is best for you. Your meal plan may vary depending on factors such as:  The calories you need.  The medicines you take.  Your weight.  Your blood glucose, blood pressure, and cholesterol levels.  Your activity level.  Other health conditions you have, such as heart or kidney disease. How do carbohydrates affect me? Carbohydrates, also called carbs, affect your blood glucose level more than any other type of food. Eating carbs naturally raises the amount of glucose in your blood. Carb counting is a method for keeping track of how many carbs you eat. Counting carbs is important to keep your blood glucose at a healthy level, especially if you use insulin or take certain oral diabetes medicines. It is important to know how many carbs you can safely have in each meal. This is different for every person. Your dietitian can help you calculate how many carbs you should have at each meal and for each snack. How does alcohol affect me? Alcohol can cause a sudden decrease in blood glucose (hypoglycemia), especially if you use insulin or take certain oral diabetes medicines. Hypoglycemia can be a  life-threatening condition. Symptoms of hypoglycemia, such as sleepiness, dizziness, and confusion, are similar to symptoms of having too much alcohol.  Do not drink alcohol if: ? Your health care provider tells you not to drink. ? You are pregnant, may be pregnant, or are planning to become pregnant.  If you drink alcohol: ? Do not drink on an empty stomach. ? Limit how much you use to:  0-1 drink a day for women.  0-2 drinks a day for men. ? Be aware of how much alcohol is in your drink. In the [place] one drink equals one 12 oz bottle of beer (355 mL), one 5 oz glass of wine (148 mL), or one 1 oz glass of hard liquor (44 mL). ? Keep yourself hydrated with water, diet soda, or unsweetened iced tea.  Keep in mind that regular soda, juice, and other mixers may contain a lot of sugar and must be counted as carbs. What are tips for following this plan? Reading food labels  Start by checking the serving size on the "Nutrition Facts" label of packaged foods and drinks. The amount of calories, carbs, fats, and other nutrients listed on the label is based on one serving of the item. Many items contain more than one serving per package.  Check the total grams (g) of carbs in one serving. You can calculate the number of servings of carbs in one serving by dividing the total carbs by 15. For example, if a food has 30 g of total carbs per serving, it  would be equal to 2 servings of carbs.  Check the number of grams (g) of saturated fats and trans fats in one serving. Choose foods that have a low amount or none of these fats.  Check the number of milligrams (mg) of salt (sodium) in one serving. Most people should limit total sodium intake to less than 2,300 mg per day.  Always check the nutrition information of foods labeled as "low-fat" or "nonfat." These foods may be higher in added sugar or refined carbs and should be avoided.  Talk to your dietitian to identify your daily goals for nutrients  listed on the label. Shopping  Avoid buying canned, pre-made, or processed foods. These foods tend to be high in fat, sodium, and added sugar.  Shop around the outside edge of the grocery store. This is where you will most often find fresh fruits and vegetables, bulk grains, fresh meats, and fresh dairy. Cooking  Use low-heat cooking methods, such as baking, instead of high-heat cooking methods like deep frying.  Cook using healthy oils, such as olive, canola, or sunflower oil.  Avoid cooking with butter, cream, or high-fat meats. Meal planning  Eat meals and snacks regularly, preferably at the same times every day. Avoid going long periods of time without eating.  Eat foods that are high in fiber, such as fresh fruits, vegetables, beans, and whole grains. Talk with your dietitian about how many servings of carbs you can eat at each meal.  Eat 4-6 oz (112-168 g) of lean protein each day, such as lean meat, chicken, fish, eggs, or tofu. One ounce (oz) of lean protein is equal to: ? 1 oz (28 g) of meat, chicken, or fish. ? 1 egg. ?  cup (62 g) of tofu.  Eat some foods each day that contain healthy fats, such as avocado, nuts, seeds, and fish.   What foods should I eat? Fruits Berries. Apples. Oranges. Peaches. Apricots. Plums. Grapes. Mango. Papaya. Pomegranate. Kiwi. Cherries. Vegetables Lettuce. Spinach. Leafy greens, including kale, chard, collard greens, and mustard greens. Beets. Cauliflower. Cabbage. Broccoli. Carrots. Green beans. Tomatoes. Peppers. Onions. Cucumbers. Brussels sprouts. Grains Whole grains, such as whole-wheat or whole-grain bread, crackers, tortillas, cereal, and pasta. Unsweetened oatmeal. Quinoa. Brown or wild rice. Meats and other proteins Seafood. Poultry without skin. Lean cuts of poultry and beef. Tofu. Nuts. Seeds. Dairy Low-fat or fat-free dairy products such as milk, yogurt, and cheese. The items listed above may not be a complete list of foods and  beverages you can eat. Contact a dietitian for more information. What foods should I avoid? Fruits Fruits canned with syrup. Vegetables Canned vegetables. Frozen vegetables with butter or cream sauce. Grains Refined white flour and flour products such as bread, pasta, snack foods, and cereals. Avoid all processed foods. Meats and other proteins Fatty cuts of meat. Poultry with skin. Breaded or fried meats. Processed meat. Avoid saturated fats. Dairy Full-fat yogurt, cheese, or milk. Beverages Sweetened drinks, such as soda or iced tea. The items listed above may not be a complete list of foods and beverages you should avoid. Contact a dietitian for more information. Questions to ask a health care provider  Do I need to meet with a diabetes educator?  Do I need to meet with a dietitian?  What number can I call if I have questions?  When are the best times to check my blood glucose? Where to find more information:  American Diabetes Association: diabetes.org  Academy of Nutrition and Dietetics: www.eatright.org  [employer] of Diabetes and Digestive and Kidney Diseases: CarFlippers.tn  Association of Diabetes Care and Education Specialists: www.diabeteseducator.org Summary  It is important to have healthy eating habits because your blood sugar (glucose) levels are greatly affected by what you eat and drink.  A healthy meal plan will help you control your blood glucose and maintain a healthy lifestyle.  Your health care provider may recommend that you work with a dietitian to make a meal plan that is best for you.  Keep in mind that carbohydrates (carbs) and alcohol have immediate effects on your blood glucose levels. It is important to count carbs and to use alcohol carefully. This information is not intended to replace advice given to you by your health care provider. Make sure you discuss any questions you have with your health care provider. Document Revised:  10/22/2019 Document Reviewed: 10/22/2019 Elsevier Patient Education  2021 [employer].

## 2021-04-23 ENCOUNTER — Telehealth: Payer: Self-pay

## 2021-04-23 NOTE — Telephone Encounter (Signed)
A user error has taken place: encounter opened in error, closed for administrative reasons.

## 2021-04-23 NOTE — Telephone Encounter (Signed)
Dr. Claiborne Billings started patient on insulin earlier this week.  Patient states it is not working and he has some questions about meds.  Please call [phone].

## 2021-04-23 NOTE — Telephone Encounter (Signed)
Spoke with pt about insulin and he states that when he is checking his CBG in the morning, the last 3 days his numbers are still high (230, 209, 246). Pt wants to know what he should be doing and if med needs to be changed. Pt states he is watching his diet very closely and does not eat after taking insulin which is between 8 & 9 at night. Pt states all he drinks is mostly flavored water. Pt is aware that PCP is out of the office until Wed 04/28/21. Pt states compliance with medications.  Please advise

## 2021-04-23 NOTE — Telephone Encounter (Signed)
[facility] Day - Client Nonclinical Telephone Record  [facility] Client [facility] Day - Client Client Site [facility] [place] - Day Physician Claiborne Bill[place], [place] Contact Type Call Who Is Calling Patient / Member / Family / Caregiver Caller Name Teegan Brandis Caller Phone Number [phone] Patient Name [name] Patient DOB 1956/03/13 Call Type Message Only Information Provided Reason for Call Request for General Office Information Initial Comment Caller was seen Tuesday, diabetic, put on insulin, but it isn't helping. DX UTI, flared up again. He wants them to send in a medication when they get in office. Additional Comment Office hours provided. Triage refused. Caller just wants to send message to office for when they open this morning. Disp. Time Disposition Final User 04/23/2021 7:18:01 AM General Information Provided Yes Tessa Lerner Call Closed By: Tessa Lerner Transaction Date/Time: 04/23/2021 7:14:36 AM (ET)

## 2021-04-28 NOTE — Telephone Encounter (Signed)
Spoke with patient regarding results/recommendations.  

## 2021-04-28 NOTE — Telephone Encounter (Signed)
Please advise patient to increase Lantus to 15 units nightly.

## 2021-05-05 ENCOUNTER — Other Ambulatory Visit: Payer: Self-pay | Admitting: Family Medicine

## 2021-05-05 NOTE — Telephone Encounter (Signed)
Pt stated that he has completed 2 pens and on the 3rd. Pt states he has only been using 15 IU.

## 2021-05-05 NOTE — Telephone Encounter (Signed)
Patient verifying that new prescription with updated dosage to 15 units has been called into pharmacy.  He said he checked with them about an hour ago and they did not have new prescription yet from our office.  Can you please verify so patient can pick up?  I told patient it looked like it was sent over but I wanted to make sure.  Thank you.

## 2021-05-05 NOTE — Telephone Encounter (Signed)
Called pharmacy to confirm pt prescription. Pharmacy confirmed that pt received 150 days of the 10 UI and pt was asking for a refills.

## 2021-05-06 NOTE — Telephone Encounter (Signed)
LVM for pt to CB regarding medication.  ?

## 2021-05-06 NOTE — Telephone Encounter (Signed)
Please explain to patient that each pen has 300 units of insulin in it. He has only been prescribed insulin for 16 days.  Even if we say he has been on 15 units and insulin for all 16 days, that still only equals 240 units that should have been used.  Therefore he should still be in the first pen supply. I would recommend we bring him in for a nurse visit so we can see where the breakdown is and get an accurate picture of how much he is actually using.  Thanks

## 2021-05-06 NOTE — Telephone Encounter (Signed)
FYI

## 2021-05-07 NOTE — Telephone Encounter (Signed)
Spoke with pt and sched for [place] next week. Pt stated that he is not receiving anywhere close to 300 units in each pen.  FYI

## 2021-05-11 ENCOUNTER — Other Ambulatory Visit: Payer: Self-pay | Admitting: Family Medicine

## 2021-05-11 NOTE — Telephone Encounter (Signed)
Patient refill request.  Patient states that pharmacy told patient Dr. Claiborne Billings denied his test strips.  I told patient I would send a note back to have them check on status of request.  He only has 2 days left of strips.  He needs test strips for his new monitor One Touch Ultra II   [facility]  Patient can be reached at [phone].

## 2021-05-12 ENCOUNTER — Ambulatory Visit: Payer: Medicare Other

## 2021-05-12 MED ORDER — ONETOUCH ULTRA VI STRP: ORAL_STRIP | 12 refills | Status: DC

## 2021-05-12 NOTE — Telephone Encounter (Signed)
Patient called back to check status of refill request.  Told patient approval was just sent to pharmacy as we were speaking.

## 2021-05-12 NOTE — Telephone Encounter (Signed)
Rx sent 

## 2021-05-18 ENCOUNTER — Other Ambulatory Visit: Payer: Self-pay

## 2021-05-18 ENCOUNTER — Encounter: Payer: Self-pay | Admitting: Family Medicine

## 2021-05-18 ENCOUNTER — Ambulatory Visit (INDEPENDENT_AMBULATORY_CARE_PROVIDER_SITE_OTHER): Payer: Medicare Other | Admitting: Family Medicine

## 2021-05-18 VITALS — BP 137/68 | HR 54 | Temp 98.1°F | Ht 70.0 in | Wt 210.0 lb

## 2021-05-18 DIAGNOSIS — E1169 Type 2 diabetes mellitus with other specified complication: Secondary | ICD-10-CM | POA: Diagnosis not present

## 2021-05-18 DIAGNOSIS — I350 Nonrheumatic aortic (valve) stenosis: Secondary | ICD-10-CM | POA: Diagnosis not present

## 2021-05-18 DIAGNOSIS — E1142 Type 2 diabetes mellitus with diabetic polyneuropathy: Secondary | ICD-10-CM

## 2021-05-18 DIAGNOSIS — E785 Hyperlipidemia, unspecified: Secondary | ICD-10-CM

## 2021-05-18 DIAGNOSIS — Z599 Problem related to housing and economic circumstances, unspecified: Secondary | ICD-10-CM

## 2021-05-18 DIAGNOSIS — I77810 Thoracic aortic ectasia: Secondary | ICD-10-CM

## 2021-05-18 DIAGNOSIS — Z139 Encounter for screening, unspecified: Secondary | ICD-10-CM | POA: Diagnosis not present

## 2021-05-18 NOTE — Progress Notes (Signed)
This visit occurred during the SARS-CoV-2 public health emergency.  Safety protocols were in place, including screening questions prior to the visit, additional usage of staff PPE, and extensive cleaning of exam room while observing appropriate contact time as indicated for disinfecting solutions.    [name] , 01-Aug-1956, 65 y.o., male MRN: [id number] Patient Care Team    Relationship Specialty Notifications Start End  Ma Hillock, DO PCP - General Family Medicine  11/29/18   Lelon Perla, MD PCP - Cardiology Cardiology  08/07/20   Bernarda Caffey, MD Consulting Physician Ophthalmology  01/09/19     Chief Complaint  Patient presents with   Diabetes     Subjective: Pt presents for an OV for insulin/sugar evaluation after starting insulin 1 month ago.  Diabetes with complications/overweight:  Patient reports he has been a diabetic since 1998.  He reports  compliance with Janumet/Metformin 1000-100 mg daily, glipizide 10 mg once daily, Farxiga 10 mg daily and is tapering up to Lantus 15 units nightly. Patient denies dizziness, hyperglycemic or hypoglycemic events. Patient denies numbness, tingling in the extremities or nonhealing wounds of feet.  He realized he was accidentally throwing out insulin by not using the insulin pen correctly.   PNA series: Completed after 63 Flu shot:  completed day 09/23/2020. Foot exam: Completed 05/11/2020-referral to podiatry.  Eye exam:  Completed 09/23/2020-[facility]  A1c:10.4>>>9.4>>  9.7>>11.8>> 8.5 >>> 6.5 >>>7.1>7.6>7.7> 8.7    Last echo 03/06/2020:  1. Technically difficult study. Left ventricular ejection fraction, by  estimation, is 45 to 50%. The left ventricle has grossly mildly decreased  function. Image quality is not sufficient to assess for regional wall  motion abnormalities. Left ventricular   diastolic parameters are indeterminate.   2. Right ventricle is poorly visualized. Grossly normal size and systolic  function.  Tricuspid regurgitation signal is inadequate for assessing PA  pressure.   3. The mitral valve is normal in structure. No evidence of mitral valve  regurgitation.   4. Aortic dilatation noted. There is mild dilatation of the ascending  aorta measuring 37 mm.   5. The aortic valve is abnormal. Moderately calcified. Aortic valve  regurgitation is not visualized. Mild aortic valve stenosis. Vmax 2.2 m/s,  MG 10 mmHg, AVA 1.7 cm^2, DI 0.4   Depression screen [facility] 2/9 09/23/2020 02/13/2020 06/04/2019 02/12/2019 11/29/2018  Decreased Interest 0 0 3 0 3  Down, Depressed, Hopeless 0 '1 3 3 3  ' PHQ - 2 Score 0 '1 6 3 6  ' Altered sleeping - - 3 0 3  Tired, decreased energy - - 3 0 3  Change in appetite - - 3 0 3  Feeling bad or failure about yourself  - - '1 1 3  ' Trouble concentrating - - 0 0 3  Moving slowly or fidgety/restless - - 0 0 2  Suicidal thoughts - - 0 0 0  PHQ-9 Score - - '16 4 23  ' Difficult doing work/chores - - Very difficult Not difficult at all -    Allergies  Allergen Reactions   Gabapentin Other (See Comments)    Paranoia/hallucinations   Hydrocodone Other (See Comments)    PT reports  Confusion when tacking Hydrocodone   Nsaids     History of heart disease, CAD with history of MI, CABG status post stent   Prednisone     Paranoia   Tramadol    Penicillins Other (See Comments)    Childhood unsure if still allgeric   Social History  Social History Narrative   Marital status/children/pets: Single   Education/employment: HS grad, 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:      -smoke alarm in the home:Yes     - wears seatbelt: Yes      Past Medical History:  Diagnosis Date   Alcohol addiction ([place])    Anxiety    Arthritis    Bulging of lumbar intervertebral disc 2017   MRI: L3-4, L4-5 and L5-S1- facet hypertrophy and formainal stenosis   CAD (coronary artery disease)    Coronary artery disease with hx of myocardial infarct w/o hx of CABG 2004   Depression    Dextrocardia     Diabetes ([place])    Heart attack ([place]) 2004   Stent placement 2008   Hyperlipidemia    Hypertension    Insomnia    Past Surgical History:  Procedure Laterality Date   CORONARY ANGIOPLASTY WITH STENT PLACEMENT  2008   Family History  Problem Relation Age of Onset   Heart attack Mother    Heart disease Mother    Diabetes Mother    Arthritis Mother    Hyperlipidemia Mother    Hypertension Mother    Breast cancer Mother    Heart attack Father    Heart disease Father    Alcohol abuse Father    Early death Father    Arthritis Sister    Heart disease Sister    Hyperlipidemia Sister    Asthma Sister    COPD Sister    Diabetes Sister    Alcohol abuse Sister    Allergies as of 05/18/2021       Reactions   Gabapentin Other (See Comments)   Paranoia/hallucinations   Hydrocodone Other (See Comments)   PT reports  Confusion when tacking Hydrocodone   Nsaids    History of heart disease, CAD with history of MI, CABG status post stent   Prednisone    Paranoia   Tramadol    Penicillins Other (See Comments)   Childhood unsure if still allgeric        Medication List        Accurate as of May 18, 2021 11:59 PM. If you have any questions, ask your nurse or doctor.          Accu-Chek FastClix Lancets Misc   aspirin EC 81 MG tablet Take 1 tablet (81 mg total) by mouth daily. What changed: how much to take   blood glucose meter kit and supplies Kit Dispense based on patient and insurance preference. Use up to four times daily as directed. DX E11.9   dapagliflozin propanediol 10 MG Tabs tablet Commonly known as: Farxiga TAKE 1 TABLET BY MOUTH  DAILY BEFORE BREAKFAST   diazepam 5 MG tablet Commonly known as: VALIUM Take 1 tablet (5 mg total) by mouth every 6 (six) hours as needed for anxiety.   glipiZIDE 10 MG tablet Commonly known as: GLUCOTROL Take 1 tablet (10 mg total) by mouth daily before breakfast.   GNP UltiCare Pen Needles 31G X 5 MM Misc Generic  drug: Insulin Pen Needle AS DIRECTED ONCE DAILY   Janumet XR [phone] MG Tb24 Generic drug: SitaGLIPtin-MetFORMIN HCl Take 1 tablet by mouth daily.   Lantus SoloStar 100 UNIT/ML Solostar Pen Generic drug: insulin glargine Inject 15 Units into the skin daily.   lisinopril-hydrochlorothiazide 20-25 MG tablet Commonly known as: ZESTORETIC Take 1 tablet by mouth daily.   metoprolol succinate 25 MG 24 hr tablet Commonly known as: TOPROL-XL Take 1 tablet (25  mg total) by mouth daily.   naproxen 500 MG tablet Commonly known as: NAPROSYN Every 12 hours with food as needed only   nitroGLYCERIN 0.4 MG SL tablet Commonly known as: NITROSTAT DISSOLVE 1 TABLET UNDER THE TONGUE EVERY 5 MINUTES AS  NEEDED FOR CHEST PAIN. MAX  OF 3 TABLETS IN 15 MINUTES. CALL 911 IF PAIN PERSISTS.   omega-3 acid ethyl esters 1 g capsule Commonly known as: LOVAZA Take 1 capsule (1 g total) by mouth daily.   OneTouch Ultra test strip Generic drug: glucose blood Use as instructed   rosuvastatin 40 MG tablet Commonly known as: CRESTOR Take 1 tablet (40 mg total) by mouth daily.   traZODone 100 MG tablet Commonly known as: DESYREL Take 1 tablet (100 mg total) by mouth at bedtime as needed for sleep.        All past medical history, surgical history, allergies, family history, immunizations andmedications were updated in the EMR today and reviewed under the history and medication portions of their EMR.     ROS: Negative, with the exception of above mentioned in HPI   Objective:  BP 137/68   Pulse (!) 54   Temp 98.1 F (36.7 C) (Oral)   Ht '5\' 10"'  (1.778 m)   Wt 210 lb (95.3 kg)   SpO2 97%   BMI 30.13 kg/m  Body mass index is 30.13 kg/m. Gen: Afebrile. No acute distress.  Nontoxic.  Pleasant male. HENT: AT. [place].  No cough. Eyes:Pupils Equal Round Reactive to light, Extraocular movements intact,  Conjunctiva without redness, discharge or icterus. Neck/lymp/endocrine: Supple, no  lymphadenopathy, no thyromegaly CV: RRR 2/6 SM, no edema, +2/4 P posterior tibialis pulses Chest: CTAB, no wheeze or crackles Skin: No rashes, purpura or petechiae.  Neuro: Normal gait. PERLA. EOMi. Alert. Oriented x3  Psych: Normal affect, dress and demeanor. Normal speech. Normal thought content and judgment.   No results found. No results found. No results found for this or any previous visit (from the past 24 hour(s)).   Assessment/Plan: [name] is a 65 y.o. male present for OV for  Dyslipidemia associated with type 2 diabetes mellitus (HCC)/diabetic peripheral neuropathy/mild nonproliferative diabetic retinopathy of both eyes/overweight:  inCreasing A1c with sedentary lifestyle.  He has not been as active as he has in the past but is trying to get back to walking daily. His fasting sugars and daytime sugars are improving since tapering on Lantus. -Continue janumet [phone] daily -Discontinue glipizide -Continue farixga 10 mg daily -Increase Lantus 25 units nightly.  Patient reports he is familiar with how to give insulin secondary to his family member-unfortunately he was accidentally throwing away insulin by not using Lantus pen correctly.  Problem has now been solved. PNA series: Completed after 63 Flu shot:  completed day 09/23/2020. Foot exam: Completed 05/11/2020-referral to podiatry.  Eye exam:  Completed 09/23/2020-[facility]  A1c:10.4>>>9.4>>  9.7>>11.8>> 8.5 >>> 6.5 >>>7.1>7.6>7.7>8.7> 8.8 on 04/20/2021  Aortic stenosis, mild/Aortic root dilatation [facility]) Patient with reports of increased fatigue and cardiac exam is positive for a more pronounced harsh systolic murmur than in the past.  He had a mild aortic stenosis with aortic root dilatation noted on his last echo 02/2020.  We will update echo and attempt to get him back into his cardiology team soon as possible. - ECHOCARDIOGRAM COMPLETE; Future  Economic circumstance affecting care/Encounter for screening involving  social determinants of health (SDoH) Patient reported he is having difficulty making ends meet between the price of his insulin/medications and having  enough food. - AMB Referral to [place] expectations re: course of current medical issues. Discussed self-management of symptoms. Outlined signs and symptoms indicating need for more acute intervention. Patient verbalized understanding and all questions were answered. Patient received an After-Visit Summary.    Orders Placed This Encounter  Procedures   AMB Referral to Community Care Coordinaton   ECHOCARDIOGRAM COMPLETE    No orders of the defined types were placed in this encounter.  Referral Orders  AMB Referral to [facility] Coordinaton     Note is dictated utilizing voice recognition software. Although note has been proof read prior to signing, occasional typographical errors still can be missed. If any questions arise, please do not hesitate to call for verification.   electronically signed by:  Howard Pouch, DO  [place]

## 2021-05-18 NOTE — Patient Instructions (Signed)
Continue lantus at 15 units.  Continue other medications.   Cardiology will call you  to get you scheduled- Your murmur sounds louder today.  Next appt in 2.5 months.   Diabetes Mellitus and Nutrition, Adult When you have diabetes, or diabetes mellitus, it is very important to have healthy eating habits because your blood sugar (glucose) levels are greatly affected by what you eat and drink. Eating healthy foods in the right amounts, at about the same times every day, can help you: Control your blood glucose. Lower your risk of heart disease. Improve your blood pressure. Reach or maintain a healthy weight. What can affect my meal plan? Every person with diabetes is different, and each person has different needs for a meal plan. Your health care provider may recommend that you work with a dietitian to make a meal plan that is best for you. Your meal plan may vary depending on factors such as: The calories you need. The medicines you take. Your weight. Your blood glucose, blood pressure, and cholesterol levels. Your activity level. Other health conditions you have, such as heart or kidney disease. How do carbohydrates affect me? Carbohydrates, also called carbs, affect your blood glucose level more than any other type of food. Eating carbs naturally raises the amount of glucose in your blood. Carb counting is a method for keeping track of how many carbs you eat. Counting carbs is important to keep your blood glucose at a healthy level,especially if you use insulin or take certain oral diabetes medicines. It is important to know how many carbs you can safely have in each meal. This is different for every person. Your dietitian can help you calculate how manycarbs you should have at each meal and for each snack. How does alcohol affect me? Alcohol can cause a sudden decrease in blood glucose (hypoglycemia), especially if you use insulin or take certain oral diabetes medicines. Hypoglycemia can  be a life-threatening condition. Symptoms of hypoglycemia, such as sleepiness, dizziness, and confusion, are similar to symptoms of having too much alcohol. Do not drink alcohol if: Your health care provider tells you not to drink. You are pregnant, may be pregnant, or are planning to become pregnant. If you drink alcohol: Do not drink on an empty stomach. Limit how much you use to: 0-1 drink a day for women. 0-2 drinks a day for men. Be aware of how much alcohol is in your drink. In the [place] one drink equals one 12 oz bottle of beer (355 mL), one 5 oz glass of wine (148 mL), or one 1 oz glass of hard liquor (44 mL). Keep yourself hydrated with water, diet soda, or unsweetened iced tea. Keep in mind that regular soda, juice, and other mixers may contain a lot of sugar and must be counted as carbs. What are tips for following this plan?  Reading food labels Start by checking the serving size on the "Nutrition Facts" label of packaged foods and drinks. The amount of calories, carbs, fats, and other nutrients listed on the label is based on one serving of the item. Many items contain more than one serving per package. Check the total grams (g) of carbs in one serving. You can calculate the number of servings of carbs in one serving by dividing the total carbs by 15. For example, if a food has 30 g of total carbs per serving, it would be equal to 2 servings of carbs. Check the number of grams (g) of saturated fats and trans  fats in one serving. Choose foods that have a low amount or none of these fats. Check the number of milligrams (mg) of salt (sodium) in one serving. Most people should limit total sodium intake to less than 2,300 mg per day. Always check the nutrition information of foods labeled as "low-fat" or "nonfat." These foods may be higher in added sugar or refined carbs and should be avoided. Talk to your dietitian to identify your daily goals for nutrients listed on the  label. Shopping Avoid buying canned, pre-made, or processed foods. These foods tend to be high in fat, sodium, and added sugar. Shop around the outside edge of the grocery store. This is where you will most often find fresh fruits and vegetables, bulk grains, fresh meats, and fresh dairy. Cooking Use low-heat cooking methods, such as baking, instead of high-heat cooking methods like deep frying. Cook using healthy oils, such as olive, canola, or sunflower oil. Avoid cooking with butter, cream, or high-fat meats. Meal planning Eat meals and snacks regularly, preferably at the same times every day. Avoid going long periods of time without eating. Eat foods that are high in fiber, such as fresh fruits, vegetables, beans, and whole grains. Talk with your dietitian about how many servings of carbs you can eat at each meal. Eat 4-6 oz (112-168 g) of lean protein each day, such as lean meat, chicken, fish, eggs, or tofu. One ounce (oz) of lean protein is equal to: 1 oz (28 g) of meat, chicken, or fish. 1 egg.  cup (62 g) of tofu. Eat some foods each day that contain healthy fats, such as avocado, nuts, seeds, and fish. What foods should I eat? Fruits Berries. Apples. Oranges. Peaches. Apricots. Plums. Grapes. Mango. Papaya.Pomegranate. Kiwi. Cherries. Vegetables Lettuce. Spinach. Leafy greens, including kale, chard, collard greens, and mustard greens. Beets. Cauliflower. Cabbage. Broccoli. Carrots. Sison beans.Tomatoes. Peppers. Onions. Cucumbers. Brussels sprouts. Grains Whole grains, such as whole-wheat or whole-grain bread, crackers, tortillas,cereal, and pasta. Unsweetened oatmeal. Quinoa. Brown or wild rice. Meats and other proteins Seafood. Poultry without skin. Lean cuts of poultry and beef. Tofu. Nuts. Seeds. Dairy Low-fat or fat-free dairy products such as milk, yogurt, and cheese. The items listed above may not be a complete list of foods and beverages you can eat. Contact a dietitian  for more information. What foods should I avoid? Fruits Fruits canned with syrup. Vegetables Canned vegetables. Frozen vegetables with butter or cream sauce. Grains Refined white flour and flour products such as bread, pasta, snack foods, andcereals. Avoid all processed foods. Meats and other proteins Fatty cuts of meat. Poultry with skin. Breaded or fried meats. Processed meat.Avoid saturated fats. Dairy Full-fat yogurt, cheese, or milk. Beverages Sweetened drinks, such as soda or iced tea. The items listed above may not be a complete list of foods and beverages you should avoid. Contact a dietitian for more information. Questions to ask a health care provider Do I need to meet with a diabetes educator? Do I need to meet with a dietitian? What number can I call if I have questions? When are the best times to check my blood glucose? Where to find more information: American Diabetes Association: diabetes.org Academy of Nutrition and Dietetics: www.eatright.[employer] of Diabetes and Digestive and Kidney Diseases: [employer] Association of Diabetes Care and Education Specialists: www.diabeteseducator.org Summary It is important to have healthy eating habits because your blood sugar (glucose) levels are greatly affected by what you eat and drink. A healthy meal plan will help you  control your blood glucose and maintain a healthy lifestyle. Your health care provider may recommend that you work with a dietitian to make a meal plan that is best for you. Keep in mind that carbohydrates (carbs) and alcohol have immediate effects on your blood glucose levels. It is important to count carbs and to use alcohol carefully. This information is not intended to replace advice given to you by your health care provider. Make sure you discuss any questions you have with your healthcare provider. Document Revised: 10/22/2019 Document Reviewed: 10/22/2019 Elsevier Patient Education   2021 [employer].

## 2021-05-19 ENCOUNTER — Telehealth: Payer: Self-pay | Admitting: Cardiology

## 2021-05-19 NOTE — Telephone Encounter (Signed)
Left message for patient to call back to discuss any symptoms that he has been having.

## 2021-05-19 NOTE — Telephone Encounter (Signed)
Patient states at his last appointment, Dr. Jens Som had mentioned him having a CT in 6 months. He states he also saw his PCP recently and they said they can hear his heart murmur more than usual. I did not see an order for a CT. Patient would like to go ahead and have the CT since his PCP is concerned about his heart murmur. He states he may be out walking so if he does not answer, to a leave message when the order is put in.

## 2021-05-20 DIAGNOSIS — Z139 Encounter for screening, unspecified: Secondary | ICD-10-CM | POA: Insufficient documentation

## 2021-05-20 DIAGNOSIS — I77810 Thoracic aortic ectasia: Secondary | ICD-10-CM | POA: Insufficient documentation

## 2021-05-20 DIAGNOSIS — I350 Nonrheumatic aortic (valve) stenosis: Secondary | ICD-10-CM | POA: Insufficient documentation

## 2021-05-24 ENCOUNTER — Telehealth: Payer: Self-pay

## 2021-05-24 NOTE — Telephone Encounter (Signed)
   Telephone encounter was:  Unsuccessful.  05/24/2021 Name: [name] MRN: [id number] DOB: March 21, 1956  Unsuccessful outbound call made today to assist with:   food pantries, utility assistance and transportation.  Outreach Attempt:  1st Attempt  A HIPAA compliant voice message was left requesting a return call.  Instructed patient to call back at [phone].  Lavance Beazer, AAS Paralegal, [facility] Care Guide  Embedded Care Coordination [employer]  Care Management  [address] [place], [place] [address] ??[email or web address]  ?? [phone]   [email or web address]

## 2021-05-25 NOTE — Telephone Encounter (Signed)
Spoke with pt, Aware of dr crenshaw's recommendations.  °

## 2021-05-25 NOTE — Telephone Encounter (Signed)
Follow Up:      Pt said he was returning a call,was not sure who called.

## 2021-05-25 NOTE — Telephone Encounter (Signed)
I called patient back, he states that he wants to know what all he needs. His PCP states that his murmur sounds worse and they think he should repeat ECHO, it is scheduled for July but he wants to know Dr.Crenshaws recommendations.  I did not see any CT order, and patient wants to know which testing he needs to have.

## 2021-05-26 ENCOUNTER — Telehealth: Payer: Self-pay

## 2021-05-26 NOTE — Telephone Encounter (Signed)
   Telephone encounter was:  Successful.  05/26/2021 Name: LUKASZ ROGUS MRN: [id number] DOB: December 25, 1955  SIDHARTH LEVERETTE is a 65 y.o. year old male who is a primary care patient of Kuneff, Renee A, DO . The community resource team was consulted for assistance with Food insecurity, financial strain, transportation  Care guide performed the following interventions: Spoke with patient he has received his letter form Medicaid transportation and will call to schedule as needed.  Patient is not interested in food bank information. Mailing information for financial resources..  Follow Up Plan:  Care guide will follow up with patient by phone over the next 7-10 days  Liridona Mashaw, AAS Paralegal, [facility] Care Guide  Embedded Care Coordination [facility] Health  Care Management  [address] [place], [place] [address] ??[email or web address]  ?? [phone]   [email or web address]

## 2021-05-28 ENCOUNTER — Telehealth: Payer: Self-pay | Admitting: Family Medicine

## 2021-05-28 MED ORDER — LANTUS SOLOSTAR 100 UNIT/ML ~~LOC~~ SOPN: 25.0000 [IU] | PEN_INJECTOR | Freq: Every day | SUBCUTANEOUS | 1 refills | Status: DC

## 2021-05-28 NOTE — Telephone Encounter (Signed)
Please inform patient: I have called in a new insulin regimen for him.  He is only able to start this new insulin regimen, once they allow him to pick up more insulin.  Hopefully he will be able to pick up a new bottle of insulin soon, since I increase the insulin dose he is to take daily. When he is allowed to pick up new prescription, he is to increase his Lantus to 20 units at night for 1 week, and then increase again to 25 units at night.  -With this increase he is to discontinue his glipizide.   Again, only start new regimen of higher dose insulin and stopping the glipizide after he picks up his new insulin prescription.   Until then-everything remains the same.

## 2021-06-01 NOTE — Telephone Encounter (Signed)
Spoke with pt regarding labs and instructions.   

## 2021-06-02 ENCOUNTER — Telehealth: Payer: Self-pay

## 2021-06-02 NOTE — Telephone Encounter (Signed)
   Telephone encounter was:  Successful.  06/02/2021 Name: [name] MRN: [id number] DOB: [birth year]  [name] is a 65 y.o. year old male who is a primary care patient of Kuneff, Renee A, DO . The community resource team was consulted for assistance with Food insecurity, financial assistance and transportation  Care guide performed the following interventions: Spoke with patient he has not received his resource letter yet but has my name and number and will call me when he receives it.                                                                                      Follow Up Plan:  No further follow up planned at this time. The patient has been provided with needed resources.  Eleina Jergens, AAS Paralegal, [facility] Care Guide  Embedded Care Coordination [place]  Care Management  [address] [place], [place] [address] ??[email or web address]  ?? [phone]   [email or web address]

## 2021-06-08 ENCOUNTER — Ambulatory Visit (HOSPITAL_COMMUNITY)
Admission: RE | Admit: 2021-06-08 | Discharge: 2021-06-08 | Disposition: A | Discharge status: Home / Self Care | Payer: Medicare Other | Source: Ambulatory Visit | Attending: Family Medicine | Admitting: Family Medicine

## 2021-06-08 ENCOUNTER — Other Ambulatory Visit: Payer: Self-pay

## 2021-06-08 DIAGNOSIS — I77810 Thoracic aortic ectasia: Secondary | ICD-10-CM | POA: Diagnosis not present

## 2021-06-08 DIAGNOSIS — I350 Nonrheumatic aortic (valve) stenosis: Secondary | ICD-10-CM

## 2021-06-08 LAB — ECHOCARDIOGRAM COMPLETE
AR max vel: 1.35 cm2
AV Area VTI: 1.35 cm2
AV Area mean vel: 1.43 cm2
AV Mean grad: 10.3 mmHg
AV Peak grad: 18.8 mmHg
Ao pk vel: 2.17 m/s
Area-P 1/2: 2.45 cm2
S' Lateral: 3.1 cm

## 2021-06-08 NOTE — Progress Notes (Signed)
*  PRELIMINARY RESULTS* Echocardiogram 2D Echocardiogram has been performed.  Stacey Drain 06/08/2021, 1:04 PM

## 2021-06-09 ENCOUNTER — Other Ambulatory Visit (HOSPITAL_BASED_OUTPATIENT_CLINIC_OR_DEPARTMENT_OTHER): Payer: Medicare Other

## 2021-06-09 ENCOUNTER — Telehealth: Payer: Self-pay | Admitting: Family Medicine

## 2021-06-09 ENCOUNTER — Telehealth: Payer: Self-pay

## 2021-06-09 DIAGNOSIS — I517 Cardiomegaly: Secondary | ICD-10-CM | POA: Insufficient documentation

## 2021-06-09 NOTE — Telephone Encounter (Signed)
Please inform patient the following information: His echocardiogram is unchanged from the one he had a little over a year ago. The squeezing/pumping mechanism of his heart is normal. He does have some mild thickening of the left ventricle, this is caused from his past history of high blood pressure.  This is stable from prior findings. The aortic valve has mild stenosis.  This is also stable from prior findings.    No concerns.  Stable findings.

## 2021-06-09 NOTE — Telephone Encounter (Signed)
LVM for pt to CB regarding results.  

## 2021-06-09 NOTE — Telephone Encounter (Signed)
Patient calling about results from EKG  Please call patient at [phone]

## 2021-06-09 NOTE — Telephone Encounter (Signed)
Please see other encounter.

## 2021-06-09 NOTE — Telephone Encounter (Signed)
Spoke with pt regarding labs and instructions.   

## 2021-06-22 ENCOUNTER — Telehealth: Payer: Self-pay | Admitting: Family Medicine

## 2021-06-22 NOTE — Telephone Encounter (Signed)
Left message for patient to call back and schedule Medicare Annual Wellness Visit (AWV).   Please offer to do virtually or by telephone.   Due for AWVI  Please schedule at anytime with Nurse Health Advisor.   

## 2021-07-09 ENCOUNTER — Telehealth: Payer: Self-pay

## 2021-07-09 NOTE — Telephone Encounter (Signed)
Patient has questions regarding insulin and time of day when he is taking it Patient can be reached at [phone]

## 2021-07-09 NOTE — Telephone Encounter (Signed)
Spoke with pt who states that his sugars has been between 180-235 avg of 190. Pt was instructed to inc his Lantus to 25 IU hs. When sugars did not drop pt switched to taking rx in morning. Pt states he has also experiencing fatigue.   Please advise.

## 2021-07-09 NOTE — Telephone Encounter (Signed)
Please inform patient to increase to Lantus 30 units daily, it does not matter if he takes it in the morning or the evening, but he needs to choose and stay with that regimen.

## 2021-07-09 NOTE — Telephone Encounter (Signed)
Spoke with pt regarding medication and instructions.  

## 2021-07-10 ENCOUNTER — Other Ambulatory Visit: Payer: Self-pay | Admitting: Family Medicine

## 2021-08-03 ENCOUNTER — Other Ambulatory Visit: Payer: Self-pay | Admitting: Family Medicine

## 2021-08-04 ENCOUNTER — Ambulatory Visit: Payer: Medicare Other

## 2021-08-11 ENCOUNTER — Ambulatory Visit (INDEPENDENT_AMBULATORY_CARE_PROVIDER_SITE_OTHER): Payer: Medicare Other | Admitting: Family Medicine

## 2021-08-11 ENCOUNTER — Other Ambulatory Visit: Payer: Self-pay

## 2021-08-11 ENCOUNTER — Encounter: Payer: Self-pay | Admitting: Family Medicine

## 2021-08-11 VITALS — BP 104/59 | HR 57 | Temp 98.1°F | Wt 208.8 lb

## 2021-08-11 DIAGNOSIS — I77810 Thoracic aortic ectasia: Secondary | ICD-10-CM

## 2021-08-11 DIAGNOSIS — I2510 Atherosclerotic heart disease of native coronary artery without angina pectoris: Secondary | ICD-10-CM

## 2021-08-11 DIAGNOSIS — I350 Nonrheumatic aortic (valve) stenosis: Secondary | ICD-10-CM | POA: Diagnosis not present

## 2021-08-11 DIAGNOSIS — I517 Cardiomegaly: Secondary | ICD-10-CM

## 2021-08-11 DIAGNOSIS — Z23 Encounter for immunization: Secondary | ICD-10-CM

## 2021-08-11 DIAGNOSIS — E785 Hyperlipidemia, unspecified: Secondary | ICD-10-CM

## 2021-08-11 DIAGNOSIS — E1142 Type 2 diabetes mellitus with diabetic polyneuropathy: Secondary | ICD-10-CM

## 2021-08-11 DIAGNOSIS — I10 Essential (primary) hypertension: Secondary | ICD-10-CM

## 2021-08-11 DIAGNOSIS — I252 Old myocardial infarction: Secondary | ICD-10-CM

## 2021-08-11 DIAGNOSIS — E1169 Type 2 diabetes mellitus with other specified complication: Secondary | ICD-10-CM

## 2021-08-11 DIAGNOSIS — E113293 Type 2 diabetes mellitus with mild nonproliferative diabetic retinopathy without macular edema, bilateral: Secondary | ICD-10-CM

## 2021-08-11 DIAGNOSIS — F411 Generalized anxiety disorder: Secondary | ICD-10-CM | POA: Diagnosis not present

## 2021-08-11 DIAGNOSIS — G4700 Insomnia, unspecified: Secondary | ICD-10-CM

## 2021-08-11 DIAGNOSIS — Z955 Presence of coronary angioplasty implant and graft: Secondary | ICD-10-CM

## 2021-08-11 LAB — POCT GLYCOSYLATED HEMOGLOBIN (HGB A1C)
HbA1c POC (<> result, manual entry): 8.3 % (ref 4.0–5.6)
HbA1c, POC (controlled diabetic range): 8.3 % — AB (ref 0.0–7.0)
HbA1c, POC (prediabetic range): 8.3 % — AB (ref 5.7–6.4)
Hemoglobin A1C: 8.3 % — AB (ref 4.0–5.6)

## 2021-08-11 MED ORDER — METOPROLOL SUCCINATE ER 25 MG PO TB24: 25.0000 mg | ORAL_TABLET | Freq: Every day | ORAL | 1 refills | Status: DC

## 2021-08-11 MED ORDER — JANUMET XR 100-1000 MG PO TB24: 1.0000 | ORAL_TABLET | Freq: Every day | ORAL | 1 refills | Status: DC

## 2021-08-11 MED ORDER — LISINOPRIL-HYDROCHLOROTHIAZIDE 20-25 MG PO TABS: 1.0000 | ORAL_TABLET | Freq: Every day | ORAL | 1 refills | Status: DC

## 2021-08-11 MED ORDER — TRAZODONE HCL 100 MG PO TABS: 100.0000 mg | ORAL_TABLET | Freq: Every evening | ORAL | 1 refills | Status: DC | PRN

## 2021-08-11 MED ORDER — ROSUVASTATIN CALCIUM 40 MG PO TABS: 40.0000 mg | ORAL_TABLET | Freq: Every day | ORAL | 3 refills | Status: DC

## 2021-08-11 MED ORDER — DIAZEPAM 5 MG PO TABS: 5.0000 mg | ORAL_TABLET | Freq: Four times a day (QID) | ORAL | 5 refills | Status: DC | PRN

## 2021-08-11 MED ORDER — DAPAGLIFLOZIN PROPANEDIOL 10 MG PO TABS: ORAL_TABLET | ORAL | 1 refills | Status: DC

## 2021-08-11 MED ORDER — LANTUS SOLOSTAR 100 UNIT/ML ~~LOC~~ SOPN: PEN_INJECTOR | SUBCUTANEOUS | 1 refills | Status: DC

## 2021-08-11 NOTE — Patient Instructions (Addendum)
  Great to see you today.  I have refilled the medication(s) we provide.   If labs were collected, we will inform you of lab results once received either by echart message or telephone call.   - echart message- for normal results that have been seen by the patient already.   - telephone call: abnormal results or if patient has not viewed results in their echart.  Increase insulin : 30 units in the morning and 10 units at night.

## 2021-08-11 NOTE — Progress Notes (Signed)
This visit occurred during the SARS-CoV-2 public health emergency.  Safety protocols were in place, including screening questions prior to the visit, additional usage of staff PPE, and extensive cleaning of exam room while observing appropriate contact time as indicated for disinfecting solutions.    [name] , Feb 27, 1956, 65 y.o., male MRN: [id number] Patient Care Team    Relationship Specialty Notifications Start End  Ma Hillock, DO PCP - General Family Medicine  11/29/18   Lelon Perla, MD PCP - Cardiology Cardiology  08/07/20   Bernarda Caffey, MD Consulting Physician Ophthalmology  01/09/19     Chief Complaint  Patient presents with   Follow-up    [facility]   Diabetes     Subjective: Pt presents for an OV for  Diabetes with complications/overweight:  Patient reports he has been a diabetic since 1998.  He reports compliance with Janumet/Metformin 1000-100 mg daily, Farxiga 10 mg daily and Lantus 30 units nightly. Patient denies dizziness, hyperglycemic or hypoglycemic events. Patient denies numbness, tingling in the extremities or nonhealing wounds of feet.  PNA series: Completed after 63 Flu shot:  completed day 09/23/2020. Foot exam: Completed 05/11/2020-referral to podiatry.  Eye exam:  Completed 09/23/2020-[facility]   Depression/anxiety/insomnia: Patient reports he is doing well on Valium 4 times daily and trazodone he has had to increase from 50 mg to 100 mg nightly.    He had been on BuSpar as well which he originally thought was helpful, but then he felt that it was not as helpful.  [place] controlled substance database reviewed 09/23/2020. Original note: He states he has a rather routine day.  He does not have much social contact in a day.  He lives by himself.  He reports that he always feels like he "stays on edge.  "He reports when he wakes up he is already feeling on edge and he will take a Valium at that time.  He then has breakfast and goes on a 4 mile walk  daily.  For lunch he starts to feel his anxiety sudden and he becomes panicked.  He states when he feels this way he will break out in a sweat, become very fearful and scared.  He states he commonly fears there is someone in the house with a gun.  He goes to his bedroom gets in his bed. He states the fear is so intense and he just cannot control it.  He denies any known trauma or event experienced in his life that would create such a fear.  He states he did go see a counselor/psychiatrist at [facility] in the past 6 months, and he did not feel that was helpful for him.  Denies any SI or HI.   Diabetes/overweight:  Patient reports he has been a diabetic since 1998.  He reports  compliance with Janumet/Metformin 1000-100 mg daily, glipizide 10 mg twice daily, Farxiga 10 mg daily. Patient denies dizziness, hyperglycemic or hypoglycemic events. Patient denies numbness, tingling in the extremities or nonhealing wounds of feet.  He does admit he has been doubling up on his [place] at times.  He has joined the [employer] and started his first workout today. PNA series: Completed after 63 Flu shot:  completed day 09/23/2020. Foot exam: Completed 05/11/2020-referral to podiatry.  Eye exam:  Completed 09/23/2020-[facility]  A1c:10.4>>>9.4>>  9.7>>11.8>> 8.5 >>> 6.5 >>>7.1>7.6>7.7> 8.7 today.   Hypertension/CAD/S/p Stent/prior MI:  Pt has a significant cardiac history of "anterior MI in 2004 with stents was LAD and  then perhaps stents to branch vessel 2008 (by DR. Renaldo-cardio note 2015)" Patient reports compliance  with lisinopril-HCTZ 20-25 mg daily.   He is compliant with baby aspirin 81 mg daily.  Plavix was DC'd by cards.  His statin was increased by cardiology to Crestor 40 mg daily.  Patient reports compliance. Patient denies chest pain, shortness of breath, dizziness or lower extremity edema.   with metoprolol 25 mg daily Diet: Low-sodium Exercise: Routine exercise daily RF: Hypertension, hyperlipidemia, diabetes,  overweight, family history, CAD, MI   Neck pain/back pain: Patient states he has been less active he is have to take more naproxen.  He has finally got back into the gym. Prior note: Patient has had recurrent posterior neck pain and headache.  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  He states he found an old prescription of naproxen and restarted it the last time he had symptoms.  He states it worked well for him.  He is wondering if we could get refills for him today.  In the past he has stated he could not tolerate NSAIDs.     Erectile dysfunction: (Documentation only) patient would like to start a medication for erectile dysfunction.  He states he seen a urologist many years ago and does not desire to see a urologist again.  Patient has a significant medical history of blood pressure with systolic less than 594 and history of CAD and MI.   Last echo 03/06/2020:  1. Technically difficult study. Left ventricular ejection fraction, by  estimation, is 45 to 50%. The left ventricle has grossly mildly decreased  function. Image quality is not sufficient to assess for regional wall  motion abnormalities. Left ventricular   diastolic parameters are indeterminate.   2. Right ventricle is poorly visualized. Grossly normal size and systolic  function. Tricuspid regurgitation signal is inadequate for assessing PA  pressure.   3. The mitral valve is normal in structure. No evidence of mitral valve  regurgitation.   4. Aortic dilatation noted. There is mild dilatation of the ascending  aorta measuring 37 mm.   5. The aortic valve is abnormal. Moderately calcified. Aortic valve  regurgitation is not visualized. Mild aortic valve stenosis. Vmax 2.2 m/s,  MG 10 mmHg, AVA 1.7 cm^2, DI 0.4   Depression screen [facility] 2/9 09/23/2020 02/13/2020 06/04/2019 02/12/2019 11/29/2018  Decreased Interest 0 0 3 0 3  Down, Depressed, Hopeless 0 '1 3 3 3  ' PHQ - 2 Score 0 '1 6 3 6   ' Altered sleeping - - 3 0 3  Tired, decreased energy - - 3 0 3  Change in appetite - - 3 0 3  Feeling bad or failure about yourself  - - '1 1 3  ' Trouble concentrating - - 0 0 3  Moving slowly or fidgety/restless - - 0 0 2  Suicidal thoughts - - 0 0 0  PHQ-9 Score - - '16 4 23  ' Difficult doing work/chores - - Very difficult Not difficult at all -    Allergies  Allergen Reactions   Gabapentin Other (See Comments)    Paranoia/hallucinations   Hydrocodone Other (See Comments)    PT reports  Confusion when tacking Hydrocodone   Nsaids     History of heart disease, CAD with history of MI, CABG status post stent   Prednisone     Paranoia   Tramadol    Penicillins Other (See Comments)    Childhood  unsure if still allgeric   Social History   Social History Narrative   Marital status/children/pets: Single   Education/employment: HS grad, 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:      -smoke alarm in the home:Yes     - wears seatbelt: Yes      Past Medical History:  Diagnosis Date   Alcohol addiction ([place])    Anxiety    Arthritis    Bulging of lumbar intervertebral disc 2017   MRI: L3-4, L4-5 and L5-S1- facet hypertrophy and formainal stenosis   CAD (coronary artery disease)    Coronary artery disease with hx of myocardial infarct w/o hx of CABG 2004   Depression    Dextrocardia    Diabetes ([place])    Heart attack ([place]) 2004   Stent placement 2008   Hyperlipidemia    Hypertension    Insomnia    Past Surgical History:  Procedure Laterality Date   CORONARY ANGIOPLASTY WITH STENT PLACEMENT  2008   Family History  Problem Relation Age of Onset   Heart attack Mother    Heart disease Mother    Diabetes Mother    Arthritis Mother    Hyperlipidemia Mother    Hypertension Mother    Breast cancer Mother    Heart attack Father    Heart disease Father    Alcohol abuse Father    Early death Father    Arthritis Sister    Heart disease Sister    Hyperlipidemia Sister    Asthma  Sister    COPD Sister    Diabetes Sister    Alcohol abuse Sister    Allergies as of 08/11/2021       Reactions   Gabapentin Other (See Comments)   Paranoia/hallucinations   Hydrocodone Other (See Comments)   PT reports  Confusion when tacking Hydrocodone   Nsaids    History of heart disease, CAD with history of MI, CABG status post stent   Prednisone    Paranoia   Tramadol    Penicillins Other (See Comments)   Childhood unsure if still allgeric        Medication List        Accurate as of August 11, 2021 11:59 PM. If you have any questions, ask your nurse or doctor.          Accu-Chek FastClix Lancets Misc   aspirin EC 81 MG tablet Take 1 tablet (81 mg total) by mouth daily. What changed: how much to take   blood glucose meter kit and supplies Kit Dispense based on patient and insurance preference. Use up to four times daily as directed. DX E11.9   dapagliflozin propanediol 10 MG Tabs tablet Commonly known as: Farxiga TAKE 1 TABLET BY MOUTH  DAILY BEFORE BREAKFAST   diazepam 5 MG tablet Commonly known as: VALIUM Take 1 tablet (5 mg total) by mouth every 6 (six) hours as needed for anxiety.   GNP UltiCare Pen Needles 31G X 5 MM Misc Generic drug: Insulin Pen Needle AS DIRECTED ONCE DAILY   Janumet XR [phone] MG Tb24 Generic drug: SitaGLIPtin-MetFORMIN HCl Take 1 tablet by mouth daily.   Lantus SoloStar 100 UNIT/ML Solostar Pen Generic drug: insulin glargine Inject 30 units in the morning and 10-15 units in the evening. What changed:  how much to take how to take this when to take this additional instructions Changed by: Howard Pouch, DO   lisinopril-hydrochlorothiazide 20-25 MG tablet Commonly known as: ZESTORETIC Take 1 tablet by mouth daily.  metoprolol succinate 25 MG 24 hr tablet Commonly known as: TOPROL-XL Take 1 tablet (25 mg total) by mouth daily.   naproxen 500 MG tablet Commonly known as: NAPROSYN Every 12 hours with food as  needed only   nitroGLYCERIN 0.4 MG SL tablet Commonly known as: NITROSTAT DISSOLVE 1 TABLET UNDER THE TONGUE EVERY 5 MINUTES AS  NEEDED FOR CHEST PAIN. MAX  OF 3 TABLETS IN 15 MINUTES. CALL 911 IF PAIN PERSISTS.   omega-3 acid ethyl esters 1 g capsule Commonly known as: LOVAZA Take 1 capsule (1 g total) by mouth daily.   OneTouch Ultra test strip Generic drug: glucose blood Use as instructed   rosuvastatin 40 MG tablet Commonly known as: CRESTOR Take 1 tablet (40 mg total) by mouth daily.   traZODone 100 MG tablet Commonly known as: DESYREL Take 1 tablet (100 mg total) by mouth at bedtime as needed for sleep.        All past medical history, surgical history, allergies, family history, immunizations andmedications were updated in the EMR today and reviewed under the history and medication portions of their EMR.     ROS: Negative, with the exception of above mentioned in HPI   Objective:  BP (!) 104/59   Pulse (!) 57   Temp 98.1 F (36.7 C)   Wt 208 lb 12.8 oz (94.7 kg)   SpO2 95%   BMI 29.96 kg/m  Body mass index is 29.96 kg/m. Gen: Afebrile. No acute distress. Nontoxic. Pleasant male.  HENT: AT. . No cough Eyes:Pupils Equal Round Reactive to light, Extraocular movements intact,  Conjunctiva without redness, discharge or icterus. Neck/lymp/endocrine: Supple,no lymphadenopathy, no thyromegaly CV: RRR 2/6 SM, no edema, +2/4 P posterior tibialis pulses Chest: CTAB, no wheeze or crackles Skin: no rashes, purpura or petechiae.  Neuro:  Normal gait. PERLA. EOMi. Alert. Oriented x3  Psych: Normal affect, dress and demeanor. Normal speech. Normal thought content and judgment.   No results found. No results found. No results found for this or any previous visit (from the past 24 hour(s)).    Assessment/Plan: [name] is a 65 y.o. male present for OV for  Dyslipidemia associated with type 2 diabetes mellitus (HCC)/diabetic peripheral neuropathy/mild  nonproliferative diabetic retinopathy of both eyes/overweight:  Starting to gain better control. He has not been as active as he has in the past but is trying to get back to walking daily. His fasting sugars and daytime sugars are improving since tapering on Lantus. -continue janumet [phone] daily -Discontinued glipizide  -Continue farixga 10 mg daily -Increase Lantus 30/10 units.    PNA series: Completed after 63 Flu shot:  completed day 09/23/2020. Foot exam: Completed 05/11/2020-referral to podiatry.  Eye exam:  Completed 09/23/2020-[facility]  A1c:10.4>>>9.4>>  9.7>>11.8>> 8.5 >>> 6.5 >>>7.1>7.6>7.7>8.7> 8.8 on 04/20/2021>8.3 today  Anxiety/depression/insomnia: Stable.  Has a history of significant depression with anxiety and paranoid features. Continue  Valium Continue trazodone to 100 mg daily Meds tried in the past: BuSpar patient reports it was not effective.  Paxil patient reports made him more anxious. -  [place] controlled substance database reviewed    Patient aware he needs a face-to-face appointment in order for any refills to be processed -Contract signed -Offered referral to counseling or psychiatry; he declined. Follow-up required every 5.5 months    Diabetes, uncontrolled/diabetic peripheral neuropathy/mild nonproliferative diabetic retinopathy of both eyes/overweight:  inCreasing A1c with sedentary lifestyle.  He has rejoined the gym and had his first workout today.  Grass cutting season  also starts within the next couple weeks. -Continue Janumet [phone] daily -Continue glipizide 10 mg BID.     -Continue farixga 10 mg daily> he was strongly discouraged from increasing dose on his own. CBC, BMP, TSH and lipids collected today. Patient is aware if sugars continue to rise we would need to start an insulin next visit. PNA series: Completed after 63 Flu shot:  completed day 09/23/2020. Foot exam: Completed 05/11/2020-referral to podiatry.  Eye exam:  Completed  09/23/2020-[facility]  A1c:10.4>>>9.4>>  9.7>>11.8>> 8.5 >>> 6.5 >>>7.1>7.6>7.7>8.7 today.   Hypertension/CAD/S/p Stent/prior MI/hypercholesteremia/aortic stenosis:  Stable Pt has a significant cardiac history of "anterior MI in 2004 with stents was LAD and then stents to a branch vessel 2008 (by DR. Renaldo- cardio note 2015)" -  He has been counseled on multiple occasions> we will only supply enough blood pressure medication to take as directed and he is directed to only take one lisinopril-HCTZ tabs daily.   -continue  lisinopril-HCTZ 20-25 mg daily -continue  metoprolol 25 mg daily - continue aspirin 81 mg daily. Cardiology told him he no longer needed the  Plavix 75 mg daily  -continue Crestor 40 mg daily Diet: Low-sodium Exercise: Routine exercise daily - now established with cardiology as well for routine follow up.    Influenza vaccine adminstered today  Reviewed expectations re: course of current medical issues. Discussed self-management of symptoms. Outlined signs and symptoms indicating need for more acute intervention. Patient verbalized understanding and all questions were answered. Patient received an After-Visit Summary.    Orders Placed This Encounter  Procedures   Flu Vaccine QUAD High Dose(Fluad)   POCT glycosylated hemoglobin (Hb A1C)    Meds ordered this encounter  Medications   dapagliflozin propanediol (FARXIGA) 10 MG TABS tablet    Sig: TAKE 1 TABLET BY MOUTH  DAILY BEFORE BREAKFAST    Dispense:  90 tablet    Refill:  1    Requesting 1 year supply> no   lisinopril-hydrochlorothiazide (ZESTORETIC) 20-25 MG tablet    Sig: Take 1 tablet by mouth daily.    Dispense:  90 tablet    Refill:  1    Requesting 1 year supply> no   JANUMET XR [phone] MG TB24    Sig: Take 1 tablet by mouth daily.    Dispense:  90 tablet    Refill:  1   metoprolol succinate (TOPROL-XL) 25 MG 24 hr tablet    Sig: Take 1 tablet (25 mg total) by mouth daily.    Dispense:  90 tablet     Refill:  1   rosuvastatin (CRESTOR) 40 MG tablet    Sig: Take 1 tablet (40 mg total) by mouth daily.    Dispense:  90 tablet    Refill:  3    Hold until pt request   traZODone (DESYREL) 100 MG tablet    Sig: Take 1 tablet (100 mg total) by mouth at bedtime as needed for sleep.    Dispense:  90 tablet    Refill:  1   diazepam (VALIUM) 5 MG tablet    Sig: Take 1 tablet (5 mg total) by mouth every 6 (six) hours as needed for anxiety.    Dispense:  120 tablet    Refill:  5   insulin glargine (LANTUS SOLOSTAR) 100 UNIT/ML Solostar Pen    Sig: Inject 30 units in the morning and 10-15 units in the evening.    Dispense:  45 mL    Refill:  1  Increase in management.    Referral Orders  No referral(s) requested today      Note is dictated utilizing voice recognition software. Although note has been proof read prior to signing, occasional typographical errors still can be missed. If any questions arise, please do not hesitate to call for verification.   electronically signed by:  Howard Pouch, DO  [place]

## 2021-08-18 ENCOUNTER — Ambulatory Visit (INDEPENDENT_AMBULATORY_CARE_PROVIDER_SITE_OTHER): Payer: Medicare Other | Admitting: *Deleted

## 2021-08-18 DIAGNOSIS — Z0000 Encounter for general adult medical examination without abnormal findings: Secondary | ICD-10-CM

## 2021-08-18 NOTE — Progress Notes (Signed)
Subjective:   [name] is a 65 y.o. male who presents for Medicare Annual/Subsequent preventive examination.  I connected with  Bricyn C Abed on 08/18/21 by a telephone enabled telemedicine application and verified that I am speaking with the correct person using two identifiers.   I discussed the limitations of evaluation and management by telemedicine. The patient expressed understanding and agreed to proceed.   Review of Systems     Cardiac Risk Factors include: advanced age (>24mn, >>29women);obesity (BMI >30kg/m2);diabetes mellitus;male gender     Objective:    Today's Vitals   There is no height or weight on file to calculate BMI.  Advanced Directives 08/18/2021 12/21/2015  Does Patient Have a Medical Advance Directive? No No  Would patient like information on creating a medical advance directive? No - Patient declined -    Current Medications (verified) Outpatient Encounter Medications as of 08/18/2021  Medication Sig   Accu-Chek FastClix Lancets MISC    aspirin EC 81 MG tablet Take 1 tablet (81 mg total) by mouth daily. (Patient taking differently: Take 243 mg by mouth daily.)   blood glucose meter kit and supplies KIT Dispense based on patient and insurance preference. Use up to four times daily as directed. DX E11.9   dapagliflozin propanediol (FARXIGA) 10 MG TABS tablet TAKE 1 TABLET BY MOUTH  DAILY BEFORE BREAKFAST   diazepam (VALIUM) 5 MG tablet Take 1 tablet (5 mg total) by mouth every 6 (six) hours as needed for anxiety.   glucose blood (ONETOUCH ULTRA) test strip Use as instructed   GNP ULTICARE PEN NEEDLES 31G X 5 MM MISC AS DIRECTED ONCE DAILY   insulin glargine (LANTUS SOLOSTAR) 100 UNIT/ML Solostar Pen Inject 30 units in the morning and 10-15 units in the evening.   JANUMET XR [id number] MG TB24 Take 1 tablet by mouth daily.   lisinopril-hydrochlorothiazide (ZESTORETIC) 20-25 MG tablet Take 1 tablet by mouth daily.   metoprolol succinate (TOPROL-XL) 25  MG 24 hr tablet Take 1 tablet (25 mg total) by mouth daily.   naproxen (NAPROSYN) 500 MG tablet Every 12 hours with food as needed only   nitroGLYCERIN (NITROSTAT) 0.4 MG SL tablet DISSOLVE 1 TABLET UNDER THE TONGUE EVERY 5 MINUTES AS  NEEDED FOR CHEST PAIN. MAX  OF 3 TABLETS IN 15 MINUTES. CALL 911 IF PAIN PERSISTS.   omega-3 acid ethyl esters (LOVAZA) 1 g capsule Take 1 capsule (1 g total) by mouth daily.   rosuvastatin (CRESTOR) 40 MG tablet Take 1 tablet (40 mg total) by mouth daily.   traZODone (DESYREL) 100 MG tablet Take 1 tablet (100 mg total) by mouth at bedtime as needed for sleep.   No facility-administered encounter medications on file as of 08/18/2021.    Allergies (verified) Gabapentin, Hydrocodone, Nsaids, Prednisone, Tramadol, and Penicillins   History: Past Medical History:  Diagnosis Date   Alcohol addiction ([facility]    Anxiety    Arthritis    Bulging of lumbar intervertebral disc 2017   MRI: L3-4, L4-5 and L5-S1- facet hypertrophy and formainal stenosis   CAD (coronary artery disease)    Coronary artery disease with hx of myocardial infarct w/o hx of CABG 2004   Depression    Dextrocardia    Diabetes ([facility]    Heart [place]   Stent placement 2008   Hyperlipidemia    Hypertension    Insomnia    Past Surgical History:  Procedure Laterality Date   CORONARY ANGIOPLASTY WITH STENT PLACEMENT  2008   Family History  Problem Relation Age of Onset   Heart attack Mother    Heart disease Mother    Diabetes Mother    Arthritis Mother    Hyperlipidemia Mother    Hypertension Mother    Breast cancer Mother    Heart attack Father    Heart disease Father    Alcohol abuse Father    Early death Father    Arthritis Sister    Heart disease Sister    Hyperlipidemia Sister    Asthma Sister    COPD Sister    Diabetes Sister    Alcohol abuse Sister    Social History   Socioeconomic History   Marital status: Single    Spouse name: Not on file   Number of  children: 0   Years of education: HS   Highest education level: Not on file  Occupational History   Occupation: Full time    Employer: UNEMPLOYED  Tobacco Use   Smoking status: Never   Smokeless tobacco: Never  Vaping Use   Vaping Use: Never used  Substance and Sexual Activity   Alcohol use: Yes    Alcohol/week: 0.0 standard drinks    Comment: Occasional   Drug use: Not Currently    Types: Cocaine, Marijuana    Comment: Former   Sexual activity: Not Currently    Partners: Female  Other Topics Concern   Not on file  Social History Narrative   Marital status/children/pets: Single   Education/employment: HS grad, 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:      -smoke alarm in the home:Yes     - wears seatbelt: Yes      Social Determinants of Radio broadcast assistant Strain: Medium Risk   Difficulty of Paying Living Expenses: Somewhat hard  Food Insecurity: No Food Insecurity   Worried About Charity fundraiser in the Last Year: Never true   Ran Out of Food in the Last Year: Never true  Transportation Needs: No Transportation Needs   Lack of Transportation (Medical): No   Lack of Transportation (Non-Medical): No  Physical Activity: Sufficiently Active   Days of Exercise per Week: 4 days   Minutes of Exercise per Session: 40 min  Stress: Stress Concern Present   Feeling of Stress : To some extent  Social Connections: Socially Isolated   Frequency of Communication with Friends and Family: More than three times a week   Frequency of Social Gatherings with Friends and Family: Twice a week   Attends Religious Services: Never   Marine scientist or Organizations: No   Attends Music therapist: Never   Marital Status: Never married    Tobacco Counseling Counseling given: Not Answered   Clinical Intake:  Pre-visit preparation completed: No  Pain : No/denies pain   Nutrition Risk Assessment:  Has the patient had any N/V/D within the last 2 months?   No  Does the patient have any non-healing wounds?  No  Has the patient had any unintentional weight loss or weight gain?  No   Diabetes:  Is the patient diabetic?  Yes  If diabetic, was a CBG obtained today?  No  Did the patient bring in their glucometer from home?  No  How often do you monitor your CBG's? 1x daily.   Financial Strains and Diabetes Management:  Are you having any financial strains with the device, your supplies or your medication? No .  Does the patient want to be seen  by Chronic Care Management for management of their diabetes?  No  Would the patient like to be referred to a Nutritionist or for Diabetic Management?  No   Diabetic Exams:  Diabetic Eye Exam:. Overdue for diabetic eye exam. Pt has been advised about the importance in completing this exam.   Patients states he will be going to [place] in October Diabetic Foot Exam: Completed . Pt has been advised about the importance in completing this exam.   Nutritional Risks: None Diabetes: Yes CBG done?: No Did pt. bring in CBG monitor from home?: No  How often do you need to have someone help you when you read instructions, pamphlets, or other written materials from your doctor or pharmacy?: 1 - Never  Diabetic? Yes  Interpreter Needed?: No  Information entered by :: Leroy Kennedy LPN   Activities of Daily Living In your present state of health, do you have any difficulty performing the following activities: 08/18/2021  Hearing? N  Vision? N  Difficulty concentrating or making decisions? N  Walking or climbing stairs? N  Dressing or bathing? N  Doing errands, shopping? N  Preparing Food and eating ? N  Using the Toilet? N  In the past six months, have you accidently leaked urine? N  Do you have problems with loss of bowel control? N  Managing your Medications? N  Managing your Finances? N  Housekeeping or managing your Housekeeping? N  Some recent data might be hidden    Patient Care  Team: Ma Hillock, DO as PCP - General (Family Medicine) Stanford Breed Denice Bors, MD as PCP - Cardiology (Cardiology) Bernarda Caffey, MD as Consulting Physician (Ophthalmology)  Indicate any recent Medical Services you may have received from other than [facility] providers in the past year (date may be approximate).     Assessment:   This is a routine wellness examination for Aarish.  Hearing/Vision screen Hearing Screening - Comments:: No trouble hearing  Vision Screening - Comments:: [facility] in [place]  Dietary issues and exercise activities discussed: Current Exercise Habits: Home exercise routine, Type of exercise: walking, Time (Minutes): 40, Frequency (Times/Week): 4, Weekly Exercise (Minutes/Week): 160, Intensity: Moderate   Goals Addressed             This Visit's Progress    Patient Stated       Would like to get A1C down       Depression Screen PHQ 2/9 Scores 08/18/2021 09/23/2020 02/13/2020 06/04/2019 02/12/2019 11/29/2018  PHQ - 2 Score 2 0 '1 6 3 6  ' PHQ- 9 Score 4 - - '16 4 23  ' Exception Documentation - - - - - Medical reason    Fall Risk Fall Risk  08/18/2021 09/23/2020 06/18/2020 06/04/2019 11/29/2018  Falls in the past year? 0 0 0 - 0  Number falls in past yr: 0 0 0 1 0  Injury with Fall? 0 0 0 0 0  Risk for fall due to : - - - Impaired balance/gait;Impaired mobility -  Follow up Falls evaluation completed;Education provided;Falls prevention discussed - - - -    FALL RISK PREVENTION PERTAINING TO THE HOME:  Any stairs in or around the home? No  If so, are there any without handrails? No  Home free of loose throw rugs in walkways, pet beds, electrical cords, etc? Yes  Adequate lighting in your home to reduce risk of falls? Yes   ASSISTIVE DEVICES UTILIZED TO PREVENT FALLS:  Life alert? No  Use of a cane, walker or w/c?  No  Grab bars in the bathroom? No  Shower chair or bench in shower? No  Elevated toilet seat or a handicapped toilet? No   TIMED UP AND GO:  Was  the test performed? No .    Cognitive Function:  Normal cognitive status assessed by direct observation by this Nurse Health Advisor. No abnormalities found.          Immunizations Immunization History  Administered Date(s) Administered   Fluad Quad(high Dose 65+) 12/30/2019, 09/23/2020, 08/11/2021   Influenza,inj,Quad PF,6+ Mos 08/30/2017, 11/29/2018   Influenza,inj,quad, With Preservative 08/30/2017   Influenza-Unspecified 11/28/2016   PFIZER(Purple Top)SARS-COV-2 Vaccination 01/12/2020, 02/04/2020   Pneumococcal Conjugate-13 02/12/2019   Pneumococcal Polysaccharide-23 03/28/2014, 12/30/2019   Tdap 04/16/2015   Zoster Recombinat (Shingrix) 02/12/2019    TDAP status: Up to date  Flu Vaccine status: Up to date  Pneumococcal vaccine status: Up to date  Covid-19 vaccine status: Information provided on how to obtain vaccines.   Qualifies for Shingles Vaccine? Yes   Zostavax completed No   Shingrix Completed?: No.    Education has been provided regarding the importance of this vaccine. Patient has been advised to call insurance company to determine out of pocket expense if they have not yet received this vaccine. Advised may also receive vaccine at local pharmacy or Health Dept. Verbalized acceptance and understanding.  Screening Tests Health Maintenance  Topic Date Due   Zoster Vaccines- Shingrix (2 of 2) 04/09/2019   COVID-19 Vaccine (3 - Booster for Pfizer series) 07/06/2020   FOOT EXAM  09/22/2021   OPHTHALMOLOGY EXAM  09/23/2021   HEMOGLOBIN A1C  02/08/2022   Fecal DNA (Cologuard)  02/23/2022   TETANUS/TDAP  04/15/2025   INFLUENZA VACCINE  Completed   Hepatitis C Screening  Completed   HPV VACCINES  Aged Out    Health Maintenance  Health Maintenance Due  Topic Date Due   Zoster Vaccines- Shingrix (2 of 2) 04/09/2019   COVID-19 Vaccine (3 - Booster for Pfizer series) 07/06/2020    Colorectal cancer screening: Type of screening: Cologuard. Completed 2021.  Repeat every 3 years  Lung Cancer Screening: (Low Dose CT Chest recommended if Age 45-80 years, 30 pack-year currently smoking OR have quit w/in 15years.) does not qualify.   Lung Cancer Screening Referral:   Additional Screening:  Hepatitis C Screening: does not qualify; Completed   Vision Screening: Recommended annual ophthalmology exams for early detection of glaucoma and other disorders of the eye. Is the patient up to date with their annual eye exam?  Yes  Who is the provider or what is the name of the office in which the patient attends annual eye exams? [facility] [place] If pt is not established with a provider, would they like to be referred to a provider to establish care? No .   Dental Screening: Recommended annual dental exams for proper oral hygiene  Community Resource Referral / Chronic Care Management: CRR required this visit?  No   CCM required this visit?  No      Plan:     I have personally reviewed and noted the following in the patient's chart:   Medical and social history Use of alcohol, tobacco or illicit drugs  Current medications and supplements including opioid prescriptions. Patient is not currently taking opioid prescriptions. Functional ability and status Nutritional status Physical activity Advanced directives List of other physicians Hospitalizations, surgeries, and ER visits in previous 12 months Vitals Screenings to include cognitive, depression, and falls Referrals and appointments  In addition,  I have reviewed and discussed with patient certain preventive protocols, quality metrics, and best practice recommendations. A written personalized care plan for preventive services as well as general preventive health recommendations were provided to patient.     Leroy Kennedy, LPN   1/77/1165   Nurse Notes:

## 2021-08-18 NOTE — Patient Instructions (Signed)
[name] , Thank you for taking time to come for your Medicare Wellness Visit. I appreciate your ongoing commitment to your health goals. Please review the following plan we discussed and let me know if I can assist you in the future.   Screening recommendations/referrals: Colonoscopy: up to date Recommended yearly ophthalmology/optometry visit for glaucoma screening and checkup Recommended yearly dental visit for hygiene and checkup  Vaccinations: Influenza vaccine: up to date Pneumococcal vaccine: up to date Tdap vaccine: up to date Shingles vaccine: 1 of 2    Advanced directives: Education provided  Conditions/risks identified:   Next appointment: 11-10-2021 @ 2:00 pm Dr. Claiborne Billings   Preventive Care 36 Years and Older, Male Preventive care refers to lifestyle choices and visits with your health care provider that can promote health and wellness. What does preventive care include? A yearly physical exam. This is also called an annual well check. Dental exams once or twice a year. Routine eye exams. Ask your health care provider how often you should have your eyes checked. Personal lifestyle choices, including: Daily care of your teeth and gums. Regular physical activity. Eating a healthy diet. Avoiding tobacco and drug use. Limiting alcohol use. Practicing safe sex. Taking low doses of aspirin every day. Taking vitamin and mineral supplements as recommended by your health care provider. What happens during an annual well check? The services and screenings done by your health care provider during your annual well check will depend on your age, overall health, lifestyle risk factors, and family history of disease. Counseling  Your health care provider may ask you questions about your: Alcohol use. Tobacco use. Drug use. Emotional well-being. Home and relationship well-being. Sexual activity. Eating habits. History of falls. Memory and ability to understand  (cognition). Work and work Astronomer. Screening  You may have the following tests or measurements: Height, weight, and BMI. Blood pressure. Lipid and cholesterol levels. These may be checked every 5 years, or more frequently if you are over 37 years old. Skin check. Lung cancer screening. You may have this screening every year starting at age 60 if you have a 30-pack-year history of smoking and currently smoke or have quit within the past 15 years. Fecal occult blood test (FOBT) of the stool. You may have this test every year starting at age 56. Flexible sigmoidoscopy or colonoscopy. You may have a sigmoidoscopy every 5 years or a colonoscopy every 10 years starting at age 50. Prostate cancer screening. Recommendations will vary depending on your family history and other risks. Hepatitis C blood test. Hepatitis B blood test. Sexually transmitted disease (STD) testing. Diabetes screening. This is done by checking your blood sugar (glucose) after you have not eaten for a while (fasting). You may have this done every 1-3 years. Abdominal aortic aneurysm (AAA) screening. You may need this if you are a current or former smoker. Osteoporosis. You may be screened starting at age 80 if you are at high risk. Talk with your health care provider about your test results, treatment options, and if necessary, the need for more tests. Vaccines  Your health care provider may recommend certain vaccines, such as: Influenza vaccine. This is recommended every year. Tetanus, diphtheria, and acellular pertussis (Tdap, Td) vaccine. You may need a Td booster every 10 years. Zoster vaccine. You may need this after age 62. Pneumococcal 13-valent conjugate (PCV13) vaccine. One dose is recommended after age 72. Pneumococcal polysaccharide (PPSV23) vaccine. One dose is recommended after age 79. Talk to your health care provider about  which screenings and vaccines you need and how often you need them. This  information is not intended to replace advice given to you by your health care provider. Make sure you discuss any questions you have with your health care provider. Document Released: 12/11/2015 Document Revised: 08/03/2016 Document Reviewed: 09/15/2015 Elsevier Interactive Patient Education  2017 [employer] Prevention in the Home Falls can cause injuries. They can happen to people of all ages. There are many things you can do to make your home safe and to help prevent falls. What can I do on the outside of my home? Regularly fix the edges of walkways and driveways and fix any cracks. Remove anything that might make you trip as you walk through a door, such as a raised step or threshold. Trim any bushes or trees on the path to your home. Use bright outdoor lighting. Clear any walking paths of anything that might make someone trip, such as rocks or tools. Regularly check to see if handrails are loose or broken. Make sure that both sides of any steps have handrails. Any raised decks and porches should have guardrails on the edges. Have any leaves, snow, or ice cleared regularly. Use sand or salt on walking paths during winter. Clean up any spills in your garage right away. This includes oil or grease spills. What can I do in the bathroom? Use night lights. Install grab bars by the toilet and in the tub and shower. Do not use towel bars as grab bars. Use non-skid mats or decals in the tub or shower. If you need to sit down in the shower, use a plastic, non-slip stool. Keep the floor dry. Clean up any water that spills on the floor as soon as it happens. Remove soap buildup in the tub or shower regularly. Attach bath mats securely with double-sided non-slip rug tape. Do not have throw rugs and other things on the floor that can make you trip. What can I do in the bedroom? Use night lights. Make sure that you have a light by your bed that is easy to reach. Do not use any sheets or  blankets that are too big for your bed. They should not hang down onto the floor. Have a firm chair that has side arms. You can use this for support while you get dressed. Do not have throw rugs and other things on the floor that can make you trip. What can I do in the kitchen? Clean up any spills right away. Avoid walking on wet floors. Keep items that you use a lot in easy-to-reach places. If you need to reach something above you, use a strong step stool that has a grab bar. Keep electrical cords out of the way. Do not use floor polish or wax that makes floors slippery. If you must use wax, use non-skid floor wax. Do not have throw rugs and other things on the floor that can make you trip. What can I do with my stairs? Do not leave any items on the stairs. Make sure that there are handrails on both sides of the stairs and use them. Fix handrails that are broken or loose. Make sure that handrails are as long as the stairways. Check any carpeting to make sure that it is firmly attached to the stairs. Fix any carpet that is loose or worn. Avoid having throw rugs at the top or bottom of the stairs. If you do have throw rugs, attach them to the floor with  carpet tape. Make sure that you have a light switch at the top of the stairs and the bottom of the stairs. If you do not have them, ask someone to add them for you. What else can I do to help prevent falls? Wear shoes that: Do not have high heels. Have rubber bottoms. Are comfortable and fit you well. Are closed at the toe. Do not wear sandals. If you use a stepladder: Make sure that it is fully opened. Do not climb a closed stepladder. Make sure that both sides of the stepladder are locked into place. Ask someone to hold it for you, if possible. Clearly mark and make sure that you can see: Any grab bars or handrails. First and last steps. Where the edge of each step is. Use tools that help you move around (mobility aids) if they are  needed. These include: Canes. Walkers. Scooters. Crutches. Turn on the lights when you go into a dark area. Replace any light bulbs as soon as they burn out. Set up your furniture so you have a clear path. Avoid moving your furniture around. If any of your floors are uneven, fix them. If there are any pets around you, be aware of where they are. Review your medicines with your doctor. Some medicines can make you feel dizzy. This can increase your chance of falling. Ask your doctor what other things that you can do to help prevent falls. This information is not intended to replace advice given to you by your health care provider. Make sure you discuss any questions you have with your health care provider. Document Released: 09/10/2009 Document Revised: 04/21/2016 Document Reviewed: 12/19/2014 Elsevier Interactive Patient Education  2017 [employer].

## 2021-10-03 ENCOUNTER — Other Ambulatory Visit: Payer: Self-pay | Admitting: Cardiology

## 2021-10-15 ENCOUNTER — Telehealth: Payer: Self-pay

## 2021-10-15 NOTE — Telephone Encounter (Signed)
Pt informed that a 6 mo supply in sept. And to call pharmacy

## 2021-10-15 NOTE — Telephone Encounter (Signed)
Patient refill request.  Patient will be out of meds on Sunday. [facility]  metoprolol succinate (TOPROL-XL) 25 MG 24 hr tablet [[id number]]

## 2021-11-10 ENCOUNTER — Ambulatory Visit (INDEPENDENT_AMBULATORY_CARE_PROVIDER_SITE_OTHER): Payer: Medicare Other | Admitting: Family Medicine

## 2021-11-10 ENCOUNTER — Encounter: Payer: Self-pay | Admitting: Family Medicine

## 2021-11-10 ENCOUNTER — Other Ambulatory Visit: Payer: Self-pay

## 2021-11-10 VITALS — BP 110/64 | HR 57 | Temp 98.1°F | Ht 70.0 in | Wt 216.0 lb

## 2021-11-10 DIAGNOSIS — I350 Nonrheumatic aortic (valve) stenosis: Secondary | ICD-10-CM | POA: Diagnosis not present

## 2021-11-10 DIAGNOSIS — R413 Other amnesia: Secondary | ICD-10-CM

## 2021-11-10 DIAGNOSIS — I77810 Thoracic aortic ectasia: Secondary | ICD-10-CM

## 2021-11-10 DIAGNOSIS — Z955 Presence of coronary angioplasty implant and graft: Secondary | ICD-10-CM | POA: Diagnosis not present

## 2021-11-10 DIAGNOSIS — I2510 Atherosclerotic heart disease of native coronary artery without angina pectoris: Secondary | ICD-10-CM

## 2021-11-10 DIAGNOSIS — E785 Hyperlipidemia, unspecified: Secondary | ICD-10-CM

## 2021-11-10 DIAGNOSIS — I10 Essential (primary) hypertension: Secondary | ICD-10-CM

## 2021-11-10 DIAGNOSIS — F411 Generalized anxiety disorder: Secondary | ICD-10-CM

## 2021-11-10 DIAGNOSIS — I517 Cardiomegaly: Secondary | ICD-10-CM

## 2021-11-10 DIAGNOSIS — F5101 Primary insomnia: Secondary | ICD-10-CM

## 2021-11-10 DIAGNOSIS — I252 Old myocardial infarction: Secondary | ICD-10-CM

## 2021-11-10 DIAGNOSIS — E1169 Type 2 diabetes mellitus with other specified complication: Secondary | ICD-10-CM

## 2021-11-10 LAB — POCT GLYCOSYLATED HEMOGLOBIN (HGB A1C)
HbA1c POC (<> result, manual entry): 8.7 % (ref 4.0–5.6)
HbA1c, POC (controlled diabetic range): 8.7 % — AB (ref 0.0–7.0)
HbA1c, POC (prediabetic range): 8.7 % — AB (ref 5.7–6.4)
Hemoglobin A1C: 8.7 % — AB (ref 4.0–5.6)

## 2021-11-10 MED ORDER — LANTUS SOLOSTAR 100 UNIT/ML ~~LOC~~ SOPN: PEN_INJECTOR | SUBCUTANEOUS | 1 refills | Status: DC

## 2021-11-10 MED ORDER — METOPROLOL SUCCINATE ER 25 MG PO TB24: 25.0000 mg | ORAL_TABLET | Freq: Every day | ORAL | 1 refills | Status: DC

## 2021-11-10 MED ORDER — LISINOPRIL-HYDROCHLOROTHIAZIDE 20-25 MG PO TABS: 1.0000 | ORAL_TABLET | Freq: Every day | ORAL | 1 refills | Status: DC

## 2021-11-10 MED ORDER — OMEGA-3-ACID ETHYL ESTERS 1 G PO CAPS: 1.0000 | ORAL_CAPSULE | Freq: Every day | ORAL | 2 refills | Status: DC

## 2021-11-10 MED ORDER — ROSUVASTATIN CALCIUM 40 MG PO TABS: 40.0000 mg | ORAL_TABLET | Freq: Every day | ORAL | 3 refills | Status: DC

## 2021-11-10 MED ORDER — DAPAGLIFLOZIN PROPANEDIOL 10 MG PO TABS: ORAL_TABLET | ORAL | 1 refills | Status: DC

## 2021-11-10 MED ORDER — DIAZEPAM 5 MG PO TABS: 5.0000 mg | ORAL_TABLET | Freq: Four times a day (QID) | ORAL | 5 refills | Status: DC | PRN

## 2021-11-10 MED ORDER — TRAZODONE HCL 100 MG PO TABS: 100.0000 mg | ORAL_TABLET | Freq: Every evening | ORAL | 1 refills | Status: DC | PRN

## 2021-11-10 MED ORDER — JANUMET XR 100-1000 MG PO TB24: 1.0000 | ORAL_TABLET | Freq: Every day | ORAL | 1 refills | Status: DC

## 2021-11-10 NOTE — Progress Notes (Signed)
This visit occurred during the SARS-CoV-2 public health emergency.  Safety protocols were in place, including screening questions prior to the visit, additional usage of staff PPE, and extensive cleaning of exam room while observing appropriate contact time as indicated for disinfecting solutions.    [name] , 1956/10/09, 65 y.o., male MRN: [id number] Patient Care Team    Relationship Specialty Notifications Start End  Ma Hillock, DO PCP - General Family Medicine  11/29/18   Lelon Perla, MD PCP - Cardiology Cardiology  08/07/20   Bernarda Caffey, MD Consulting Physician Ophthalmology  01/09/19     Chief Complaint  Patient presents with   Diabetes    [place]; pt is fasting     Subjective: Pt presents for an OV for  Diabetes with complications/overweight:  Patient reports he has been a diabetic since 1998.  He reports with compliance Janumet/Metformin 1000-100 mg daily, Farxiga 10 mg daily and Lantus 40 units nightly. Patient denies dizziness, hyperglycemic or hypoglycemic events. Patient denies numbness, tingling in the extremities or nonhealing wounds of feet.  PNA series: Completed after 63 Flu shot:  completed day 09/23/2020. Foot exam: Completed 05/11/2020-referral to podiatry.  Eye exam:  Completed 09/23/2020-[facility]    Depression/anxiety/insomnia: Patient reports he is feeling well n Valium 4 times daily and trazodone 100 mg nightly.    He had been on BuSpar as well which he originally thought was helpful, but then he felt that it was not as helpful.  [place] controlled substance database reviewed today. Original note: He states he has a rather routine day.  He does not have much social contact in a day.  He lives by himself.  He reports that he always feels like he "stays on edge.  "He reports when he wakes up he is already feeling on edge and he will take a Valium at that time.  He then has breakfast and goes on a 4 mile walk daily.  For lunch he starts to feel  his anxiety sudden and he becomes panicked.  He states when he feels this way he will break out in a sweat, become very fearful and scared.  He states he commonly fears there is someone in the house with a gun.  He goes to his bedroom gets in his bed. He states the fear is so intense and he just cannot control it.  He denies any known trauma or event experienced in his life that would create such a fear.  He states he did go see a counselor/psychiatrist at [facility] in the past 6 months, and he did not feel that was helpful for him.  Denies any SI or HI.   Hypertension/CAD/S/p Stent/prior MI:  Pt has a significant cardiac history of "anterior MI in 2004 with stents was LAD and then perhaps stents to branch vessel 2008 (by DR. Renaldo-cardio note 2015)" Patient reports compliance with lisinopril-HCTZ 20-25 mg daily and metoprolol 25 mg daily, Lovaza and Crestor.   He is compliant with baby aspirin 81 mg daily.  Plavix was DC'd by cards.  His statin was increased by cardiology to Crestor 40 mg daily.  Patient reports compliance. Patient denies chest pain, shortness of breath, dizziness or lower extremity edema.   with metoprolol 25 mg daily Diet: Low-sodium Exercise: Routine exercise daily RF: Hypertension, hyperlipidemia, diabetes, overweight, family history, CAD, MI   Neck pain/back pain: Patient states he has been less active he is have to take more naproxen.  He has finally  got back into the gym last visit, and then again states he has not been going to the gym as much. Prior note: Patient has had recurrent posterior neck pain and headache.  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  He states he found an old prescription of naproxen and restarted it the last time he had symptoms.  He states it worked well for him.  He is wondering if we could get refills for him today.  In the past he has stated he could not tolerate NSAIDs.     Erectile dysfunction:  (Documentation only) patient would like to start a medication for erectile dysfunction.  He states he seen a urologist many years ago and does not desire to see a urologist again.  Patient has a significant medical history of blood pressure with systolic less than 132 and history of CAD and MI.   Last echo 03/06/2020:  1. Technically difficult study. Left ventricular ejection fraction, by  estimation, is 45 to 50%. The left ventricle has grossly mildly decreased  function. Image quality is not sufficient to assess for regional wall  motion abnormalities. Left ventricular   diastolic parameters are indeterminate.   2. Right ventricle is poorly visualized. Grossly normal size and systolic  function. Tricuspid regurgitation signal is inadequate for assessing PA  pressure.   3. The mitral valve is normal in structure. No evidence of mitral valve  regurgitation.   4. Aortic dilatation noted. There is mild dilatation of the ascending  aorta measuring 37 mm.   5. The aortic valve is abnormal. Moderately calcified. Aortic valve  regurgitation is not visualized. Mild aortic valve stenosis. Vmax 2.2 m/s,  MG 10 mmHg, AVA 1.7 cm^2, DI 0.4   Depression screen [facility] 2/9 11/10/2021 08/18/2021 09/23/2020 02/13/2020 06/04/2019  Decreased Interest 0 1 0 0 3  Down, Depressed, Hopeless 0 1 0 1 3  PHQ - 2 Score 0 2 0 1 6  Altered sleeping - 0 - - 3  Tired, decreased energy - 0 - - 3  Change in appetite - 1 - - 3  Feeling bad or failure about yourself  - 1 - - 1  Trouble concentrating - 0 - - 0  Moving slowly or fidgety/restless - 0 - - 0  Suicidal thoughts - 0 - - 0  PHQ-9 Score - 4 - - 16  Difficult doing work/chores - Not difficult at all - - Very difficult    Allergies  Allergen Reactions   Gabapentin Other (See Comments)    Paranoia/hallucinations   Hydrocodone Other (See Comments)    PT reports  Confusion when tacking Hydrocodone   Nsaids     History of heart disease, CAD with history of MI,  CABG status post stent   Prednisone     Paranoia   Tramadol    Penicillins Other (See Comments)    Childhood unsure if still allgeric   Social History   Social History Narrative   Marital status/children/pets: Single   Education/employment: HS grad, 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:      -smoke alarm in the home:Yes     - wears seatbelt: Yes      Past Medical History:  Diagnosis Date   Alcohol addiction ([place])    Anxiety    Arthritis    Bulging of lumbar intervertebral disc 2017   MRI: L3-4, L4-5 and L5-S1- facet hypertrophy and formainal stenosis   CAD (coronary  artery disease)    Coronary artery disease with hx of myocardial infarct w/o hx of CABG 2004   Depression    Dextrocardia    Diabetes ([place])    Heart attack ([place]) 2004   Stent placement 2008   Hyperlipidemia    Hypertension    Insomnia    Past Surgical History:  Procedure Laterality Date   CORONARY ANGIOPLASTY WITH STENT PLACEMENT  2008   Family History  Problem Relation Age of Onset   Heart attack Mother    Heart disease Mother    Diabetes Mother    Arthritis Mother    Hyperlipidemia Mother    Hypertension Mother    Breast cancer Mother    Heart attack Father    Heart disease Father    Alcohol abuse Father    Early death Father    Arthritis Sister    Heart disease Sister    Hyperlipidemia Sister    Asthma Sister    COPD Sister    Diabetes Sister    Alcohol abuse Sister    Allergies as of 11/10/2021       Reactions   Gabapentin Other (See Comments)   Paranoia/hallucinations   Hydrocodone Other (See Comments)   PT reports  Confusion when tacking Hydrocodone   Nsaids    History of heart disease, CAD with history of MI, CABG status post stent   Prednisone    Paranoia   Tramadol    Penicillins Other (See Comments)   Childhood unsure if still allgeric        Medication List        Accurate as of November 10, 2021 11:59 PM. If you have any  questions, ask your nurse or doctor.          Accu-Chek FastClix Lancets Misc   aspirin EC 81 MG tablet Take 1 tablet (81 mg total) by mouth daily. What changed: how much to take   blood glucose meter kit and supplies Kit Dispense based on patient and insurance preference. Use up to four times daily as directed. DX E11.9   dapagliflozin propanediol 10 MG Tabs tablet Commonly known as: Farxiga TAKE 1 TABLET BY MOUTH  DAILY BEFORE BREAKFAST   diazepam 5 MG tablet Commonly known as: VALIUM Take 1 tablet (5 mg total) by mouth every 6 (six) hours as needed for anxiety.   GNP UltiCare Pen Needles 31G X 5 MM Misc Generic drug: Insulin Pen Needle AS DIRECTED ONCE DAILY   Janumet XR [phone] MG Tb24 Generic drug: SitaGLIPtin-MetFORMIN HCl Take 1 tablet by mouth daily.   Lantus SoloStar 100 UNIT/ML Solostar Pen Generic drug: insulin glargine Inject 40 units in the morning and 10 units in the evening. What changed: additional instructions Changed by: Howard Pouch, DO   lisinopril-hydrochlorothiazide 20-25 MG tablet Commonly known as: ZESTORETIC Take 1 tablet by mouth daily.   metoprolol succinate 25 MG 24 hr tablet Commonly known as: TOPROL-XL Take 1 tablet (25 mg total) by mouth daily.   naproxen 500 MG tablet Commonly known as: NAPROSYN Every 12 hours with food as needed only   nitroGLYCERIN 0.4 MG SL tablet Commonly known as: NITROSTAT DISSOLVE 1 TABLET UNDER THE TONGUE EVERY 5 MINUTES AS  NEEDED FOR CHEST PAIN. MAX  OF 3 TABLETS IN 15 MINUTES. CALL 911 IF PAIN PERSISTS.   omega-3 acid ethyl esters 1 g capsule Commonly known as: LOVAZA Take 1 capsule (1 g total) by mouth daily.   OneTouch Ultra test strip Generic drug: glucose  blood Use as instructed   rosuvastatin 40 MG tablet Commonly known as: CRESTOR Take 1 tablet (40 mg total) by mouth daily.   traZODone 100 MG tablet Commonly known as: DESYREL Take 1 tablet (100 mg total) by mouth at bedtime as needed  for sleep.        All past medical history, surgical history, allergies, family history, immunizations andmedications were updated in the EMR today and reviewed under the history and medication portions of their EMR.     ROS: Negative, with the exception of above mentioned in HPI   Objective:  BP 110/64    Pulse (!) 57    Temp 98.1 F (36.7 C) (Oral)    Ht '5\' 10"'  (1.778 m)    Wt 216 lb (98 kg)    SpO2 97%    BMI 30.99 kg/m  Body mass index is 30.99 kg/m. Physical Exam Vitals and nursing note reviewed.  Constitutional:      General: He is not in acute distress.    Appearance: Normal appearance. He is obese. He is not ill-appearing, toxic-appearing or diaphoretic.  HENT:     Head: Normocephalic and atraumatic.     Mouth/Throat:     Mouth: Mucous membranes are moist.  Eyes:     General: No scleral icterus.       Right eye: No discharge.        Left eye: No discharge.     Extraocular Movements: Extraocular movements intact.     Pupils: Pupils are equal, round, and reactive to light.  Cardiovascular:     Rate and Rhythm: Normal rate and regular rhythm.     Pulses: Normal pulses.     Heart sounds: Murmur heard.    No friction rub. No gallop.  Pulmonary:     Effort: Pulmonary effort is normal. No respiratory distress.     Breath sounds: Normal breath sounds. No wheezing, rhonchi or rales.  Musculoskeletal:        General: Normal range of motion.     Cervical back: Neck supple.  Lymphadenopathy:     Cervical: No cervical adenopathy.  Skin:    General: Skin is warm and dry.     Coloration: Skin is not jaundiced or pale.     Findings: No rash.  Neurological:     Mental Status: He is alert and oriented to person, place, and time. Mental status is at baseline.  Psychiatric:        Mood and Affect: Mood normal.        Behavior: Behavior normal.        Thought Content: Thought content normal.        Judgment: Judgment normal.      No results found. No results found. No  results found for this or any previous visit (from the past 24 hour(s)).     Assessment/Plan: [name] is a 65 y.o. male present for OV for  Dyslipidemia associated with type 2 diabetes mellitus (HCC)/diabetic peripheral neuropathy/mild nonproliferative diabetic retinopathy of both eyes/overweight:  He has not been following a diabetic diet.   Suspect diet, lack of exercise and alcohol consumption to be playing a factor in his inability to decrease his A1c despite increasing amounts of medications. -continue janumet [phone] daily -Continue glipizide 10 twice daily -Continue farixga 10 mg daily -Increase Lantus 40/10 units.    PNA series: Completed after 63 Flu shot:  completed day 09/23/2020. Foot exam: Completed 05/11/2020-referral to podiatry.  Eye exam:  Completed  09/23/2020-[facility]  A1c:10.4>>>9.4>>  9.7>>11.8>> 8.5 >>> 6.5 >>>7.1>7.6>7.7>8.7> 8.8 on 04/20/2021>8.3 > 8.7 today  Anxiety/depression/insomnia: Stable Has a history of significant depression with anxiety and paranoid features. Continue Valium Continue trazodone to 100 mg daily Meds tried in the past: BuSpar patient reports it was not effective.  Paxil patient reports made him more anxious. -  [place] controlled substance database reviewed    Patient aware he needs a face-to-face appointment in order for any refills to be processed -Contract signed -Offered referral to counseling or psychiatry; he declined. Follow-up required every 5.5 months    Hypertension/CAD/S/p Stent/prior MI/hypercholesteremia/aortic stenosis/aortic dilatation/LVH:  Stable Pt has a significant cardiac history of "anterior MI in 2004 with stents was LAD and then stents to a branch vessel 2008 (by DR. Renaldo- cardio note 2015)" Continue lisinopril-HCTZ 20-25 mg daily -Continue metoprolol 25 mg daily - continue aspirin 81 mg daily.  Cardiology discontinued Plavix  -continue Crestor 40 mg daily Diet: Low-sodium Exercise: Routine  exercise daily - now established with cardiology as well for routine follow up.    Reviewed expectations re: course of current medical issues. Discussed self-management of symptoms. Outlined signs and symptoms indicating need for more acute intervention. Patient verbalized understanding and all questions were answered. Patient received an After-Visit Summary.    Orders Placed This Encounter  Procedures   Comp Met (CMET)   CBC w/Diff   Iron, TIBC and Ferritin Panel   Vitamin D (25 hydroxy)   TSH   Direct LDL   POCT HgB A1C    Meds ordered this encounter  Medications   omega-3 acid ethyl esters (LOVAZA) 1 g capsule    Sig: Take 1 capsule (1 g total) by mouth daily.    Dispense:  90 capsule    Refill:  2    Requesting 1 year supply   dapagliflozin propanediol (FARXIGA) 10 MG TABS tablet    Sig: TAKE 1 TABLET BY MOUTH  DAILY BEFORE BREAKFAST    Dispense:  90 tablet    Refill:  1    Requesting 1 year supply> no   JANUMET XR [phone] MG TB24    Sig: Take 1 tablet by mouth daily.    Dispense:  90 tablet    Refill:  1   lisinopril-hydrochlorothiazide (ZESTORETIC) 20-25 MG tablet    Sig: Take 1 tablet by mouth daily.    Dispense:  90 tablet    Refill:  1    Requesting 1 year supply> no   metoprolol succinate (TOPROL-XL) 25 MG 24 hr tablet    Sig: Take 1 tablet (25 mg total) by mouth daily.    Dispense:  90 tablet    Refill:  1   rosuvastatin (CRESTOR) 40 MG tablet    Sig: Take 1 tablet (40 mg total) by mouth daily.    Dispense:  90 tablet    Refill:  3   traZODone (DESYREL) 100 MG tablet    Sig: Take 1 tablet (100 mg total) by mouth at bedtime as needed for sleep.    Dispense:  90 tablet    Refill:  1   insulin glargine (LANTUS SOLOSTAR) 100 UNIT/ML Solostar Pen    Sig: Inject 40 units in the morning and 10 units in the evening.    Dispense:  60 mL    Refill:  1    Increase in management.   diazepam (VALIUM) 5 MG tablet    Sig: Take 1 tablet (5 mg  total) by mouth every 6 (six)  hours as needed for anxiety.    Dispense:  120 tablet    Refill:  5     Referral Orders  No referral(s) requested today      Note is dictated utilizing voice recognition software. Although note has been proof read prior to signing, occasional typographical errors still can be missed. If any questions arise, please do not hesitate to call for verification.   electronically signed by:  Howard Pouch, DO  [place]

## 2021-11-10 NOTE — Patient Instructions (Addendum)
Great to see you today.  I have refilled the medication(s) we provide.   If labs were collected, we will inform you of lab results once received either by echart message or telephone call.   - echart message- for normal results that have been seen by the patient already.   - telephone call: abnormal results or if patient has not viewed results in their echart.  Increase insulin to 40u at night and 10u in the morning after breakfast  Diabetes Mellitus and Nutrition, Adult When you have diabetes, or diabetes mellitus, it is very important to have healthy eating habits because your blood sugar (glucose) levels are greatly affected by what you eat and drink. Eating healthy foods in the right amounts, at about the same times every day, can help you: Manage your blood glucose. Lower your risk of heart disease. Improve your blood pressure. Reach or maintain a healthy weight. What can affect my meal plan? Every person with diabetes is different, and each person has different needs for a meal plan. Your health care provider may recommend that you work with a dietitian to make a meal plan that is best for you. Your meal plan may vary depending on factors such as: The calories you need. The medicines you take. Your weight. Your blood glucose, blood pressure, and cholesterol levels. Your activity level. Other health conditions you have, such as heart or kidney disease. How do carbohydrates affect me? Carbohydrates, also called carbs, affect your blood glucose level more than any other type of food. Eating carbs raises the amount of glucose in your blood. It is important to know how many carbs you can safely have in each meal. This is different for every person. Your dietitian can help you calculate how many carbs you should have at each meal and for each snack. How does alcohol affect me? Alcohol can cause a decrease in blood glucose (hypoglycemia), especially if you use insulin or take certain  diabetes medicines by mouth. Hypoglycemia can be a life-threatening condition. Symptoms of hypoglycemia, such as sleepiness, dizziness, and confusion, are similar to symptoms of having too much alcohol. Do not drink alcohol if: Your health care provider tells you not to drink. You are pregnant, may be pregnant, or are planning to become pregnant. If you drink alcohol: Limit how much you have to: 0-1 drink a day for women. 0-2 drinks a day for men. Know how much alcohol is in your drink. In the [place] one drink equals one 12 oz bottle of beer (355 mL), one 5 oz glass of wine (148 mL), or one 1 oz glass of hard liquor (44 mL). Keep yourself hydrated with water, diet soda, or unsweetened iced tea. Keep in mind that regular soda, juice, and other mixers may contain a lot of sugar and must be counted as carbs. What are tips for following this plan? Reading food labels Start by checking the serving size on the Nutrition Facts label of packaged foods and drinks. The number of calories and the amount of carbs, fats, and other nutrients listed on the label are based on one serving of the item. Many items contain more than one serving per package. Check the total grams (g) of carbs in one serving. Check the number of grams of saturated fats and trans fats in one serving. Choose foods that have a low amount or none of these fats. Check the number of milligrams (mg) of salt (sodium) in one serving. Most people should limit total sodium intake  to less than 2,300 mg per day. Always check the nutrition information of foods labeled as "low-fat" or "nonfat." These foods may be higher in added sugar or refined carbs and should be avoided. Talk to your dietitian to identify your daily goals for nutrients listed on the label. Shopping Avoid buying canned, pre-made, or processed foods. These foods tend to be high in fat, sodium, and added sugar. Shop around the outside edge of the grocery store. This is where you  will most often find fresh fruits and vegetables, bulk grains, fresh meats, and fresh dairy products. Cooking Use low-heat cooking methods, such as baking, instead of high-heat cooking methods, such as deep frying. Cook using healthy oils, such as olive, canola, or sunflower oil. Avoid cooking with butter, cream, or high-fat meats. Meal planning Eat meals and snacks regularly, preferably at the same times every day. Avoid going long periods of time without eating. Eat foods that are high in fiber, such as fresh fruits, vegetables, beans, and whole grains. Eat 4-6 oz (112-168 g) of lean protein each day, such as lean meat, chicken, fish, eggs, or tofu. One ounce (oz) (28 g) of lean protein is equal to: 1 oz (28 g) of meat, chicken, or fish. 1 egg.  cup (62 g) of tofu. Eat some foods each day that contain healthy fats, such as avocado, nuts, seeds, and fish. What foods should I eat? Fruits Berries. Apples. Oranges. Peaches. Apricots. Plums. Grapes. Mangoes. Papayas. Pomegranates. Kiwi. Cherries. Vegetables Leafy greens, including lettuce, spinach, kale, chard, collard greens, mustard greens, and cabbage. Beets. Cauliflower. Broccoli. Carrots. Green beans. Tomatoes. Peppers. Onions. Cucumbers. Brussels sprouts. Grains Whole grains, such as whole-wheat or whole-grain bread, crackers, tortillas, cereal, and pasta. Unsweetened oatmeal. Quinoa. Brown or wild rice. Meats and other proteins Seafood. Poultry without skin. Lean cuts of poultry and beef. Tofu. Nuts. Seeds. Dairy Low-fat or fat-free dairy products such as milk, yogurt, and cheese. The items listed above may not be a complete list of foods and beverages you can eat and drink. Contact a dietitian for more information. What foods should I avoid? Fruits Fruits canned with syrup. Vegetables Canned vegetables. Frozen vegetables with butter or cream sauce. Grains Refined white flour and flour products such as bread, pasta, snack foods,  and cereals. Avoid all processed foods. Meats and other proteins Fatty cuts of meat. Poultry with skin. Breaded or fried meats. Processed meat. Avoid saturated fats. Dairy Full-fat yogurt, cheese, or milk. Beverages Sweetened drinks, such as soda or iced tea. The items listed above may not be a complete list of foods and beverages you should avoid. Contact a dietitian for more information. Questions to ask a health care provider Do I need to meet with a certified diabetes care and education specialist? Do I need to meet with a dietitian? What number can I call if I have questions? When are the best times to check my blood glucose? Where to find more information: American Diabetes Association: diabetes.org Academy of Nutrition and Dietetics: eatright.[employer] of Diabetes and Digestive and Kidney Diseases: StageSync.si Association of Diabetes Care & Education Specialists: diabeteseducator.org Summary It is important to have healthy eating habits because your blood sugar (glucose) levels are greatly affected by what you eat and drink. It is important to use alcohol carefully. A healthy meal plan will help you manage your blood glucose and lower your risk of heart disease. Your health care provider may recommend that you work with a dietitian to make a meal plan that  is best for you. This information is not intended to replace advice given to you by your health care provider. Make sure you discuss any questions you have with your health care provider. Document Revised: 06/17/2020 Document Reviewed: 06/17/2020 Elsevier Patient Education  [place].

## 2021-11-11 LAB — COMPREHENSIVE METABOLIC PANEL
ALT: 31 U/L (ref 0–53)
AST: 25 U/L (ref 0–37)
Albumin: 4.5 g/dL (ref 3.5–5.2)
Alkaline Phosphatase: 38 U/L — ABNORMAL LOW (ref 39–117)
BUN: 18 mg/dL (ref 6–23)
CO2: 33 mEq/L — ABNORMAL HIGH (ref 19–32)
Calcium: 9.6 mg/dL (ref 8.4–10.5)
Chloride: 97 mEq/L (ref 96–112)
Creatinine, Ser: 0.79 mg/dL (ref 0.40–1.50)
GFR: 92.06 mL/min (ref >60.00)
Glucose, Bld: 146 mg/dL — ABNORMAL HIGH (ref 70–99)
Potassium: 3.8 mEq/L (ref 3.5–5.1)
Sodium: 137 mEq/L (ref 135–145)
Total Bilirubin: 0.3 mg/dL (ref 0.2–1.2)
Total Protein: 7.6 g/dL (ref 6.0–8.3)

## 2021-11-11 LAB — CBC WITH DIFFERENTIAL/PLATELET
Basophils Absolute: 0 10*3/uL (ref 0.0–0.1)
Basophils Relative: 0.7 % (ref 0.0–3.0)
Eosinophils Absolute: 0.1 10*3/uL (ref 0.0–0.7)
Eosinophils Relative: 1.8 % (ref 0.0–5.0)
HCT: 45.8 % (ref 39.0–52.0)
Hemoglobin: 15.4 g/dL (ref 13.0–17.0)
Lymphocytes Relative: 32.5 % (ref 12.0–46.0)
Lymphs Abs: 1.6 10*3/uL (ref 0.7–4.0)
MCHC: 33.5 g/dL (ref 30.0–36.0)
MCV: 89.9 fl (ref 78.0–100.0)
Monocytes Absolute: 0.6 10*3/uL (ref 0.1–1.0)
Monocytes Relative: 12.1 % — ABNORMAL HIGH (ref 3.0–12.0)
Neutro Abs: 2.6 10*3/uL (ref 1.4–7.7)
Neutrophils Relative %: 52.9 % (ref 43.0–77.0)
Platelets: 181 10*3/uL (ref 150.0–400.0)
RBC: 5.1 Mil/uL (ref 4.22–5.81)
RDW: 13.8 % (ref 11.5–15.5)
WBC: 4.8 10*3/uL (ref 4.0–10.5)

## 2021-11-11 LAB — IRON,TIBC AND FERRITIN PANEL
%SAT: 24 % (calc) (ref 20–48)
Ferritin: 179 ng/mL (ref 24–380)
Iron: 88 ug/dL (ref 50–180)
TIBC: 367 mcg/dL (calc) (ref 250–425)

## 2021-11-11 LAB — VITAMIN D 25 HYDROXY (VIT D DEFICIENCY, FRACTURES): VITD: 11.86 ng/mL — ABNORMAL LOW (ref 30.00–100.00)

## 2021-11-11 LAB — LDL CHOLESTEROL, DIRECT: Direct LDL: 47 mg/dL

## 2021-11-11 LAB — TSH: TSH: 2.89 u[IU]/mL (ref 0.35–5.50)

## 2021-11-12 ENCOUNTER — Telehealth: Payer: Self-pay | Admitting: Family Medicine

## 2021-11-12 DIAGNOSIS — E559 Vitamin D deficiency, unspecified: Secondary | ICD-10-CM | POA: Insufficient documentation

## 2021-11-12 MED ORDER — VITAMIN D (ERGOCALCIFEROL) 1.25 MG (50000 UNIT) PO CAPS: 50000.0000 [IU] | ORAL_CAPSULE | ORAL | 0 refills | Status: DC

## 2021-11-12 NOTE — Telephone Encounter (Signed)
LVM for pt to CB regarding results.  

## 2021-11-12 NOTE — Telephone Encounter (Signed)
Please call patient: His vitamin D was severely low at 11.  Normal levels are about 30 and ideally for bone health and memory level should be between 40-50. I have called in a vitamin D supplement that is high-dose once weekly-stick to the same day weekly such as taking once every Monday with food for 12 weeks.  We will recheck levels at his next appointment.   -Low vitamin D is one of the things that cause memory decline.  So hopefully once we get this repleted he will notice his memory will improve.  -His liver and kidney function is normal. Thyroid is normal. Cholesterol looks great. Iron levels are normal.  Blood cell counts are normal.  Follow-up in 4 months

## 2021-11-15 NOTE — Telephone Encounter (Signed)
Spoke with pt regarding results/recommendations,voiced understanding. He will pick up medication today to start

## 2021-12-22 ENCOUNTER — Telehealth: Payer: Self-pay | Admitting: Family Medicine

## 2021-12-22 DIAGNOSIS — M5136 Other intervertebral disc degeneration, lumbar region: Secondary | ICD-10-CM

## 2021-12-22 NOTE — Telephone Encounter (Signed)
Referral placed.

## 2021-12-22 NOTE — Telephone Encounter (Signed)
Pt was referred a couple years ago to [facility]. Please advise if okay again

## 2021-12-22 NOTE — Telephone Encounter (Signed)
Ok to refer.

## 2021-12-22 NOTE — Telephone Encounter (Signed)
Pt called and wanted to know if he can get a referral to an orthopedic doctor for back problems that have been going on for 3 or 4 years, medicine isnt helping a whole lot. He would like a call back at [phone]. He said if he has to have an appt with Dr Claiborne Billings it would have to be after 01/09/22

## 2021-12-22 NOTE — Addendum Note (Signed)
Addended by: Maxie Barb on: 12/22/2021 03:39 PM   Modules accepted: Orders

## 2021-12-27 ENCOUNTER — Other Ambulatory Visit: Payer: Self-pay | Admitting: Cardiology

## 2022-01-13 ENCOUNTER — Ambulatory Visit (INDEPENDENT_AMBULATORY_CARE_PROVIDER_SITE_OTHER): Payer: Medicare Other | Admitting: Surgery

## 2022-01-13 ENCOUNTER — Encounter: Payer: Self-pay | Admitting: Surgery

## 2022-01-13 ENCOUNTER — Ambulatory Visit: Payer: Self-pay

## 2022-01-13 ENCOUNTER — Other Ambulatory Visit: Payer: Self-pay

## 2022-01-13 VITALS — BP 105/62 | HR 57 | Ht 70.0 in | Wt 216.0 lb

## 2022-01-13 DIAGNOSIS — M4726 Other spondylosis with radiculopathy, lumbar region: Secondary | ICD-10-CM

## 2022-01-13 DIAGNOSIS — M545 Low back pain, unspecified: Secondary | ICD-10-CM | POA: Diagnosis not present

## 2022-01-13 NOTE — Progress Notes (Unsigned)
Office Visit Note   Patient: [name]           Date of Birth: 1956/02/04           MRN: [id number] Visit Date: 01/13/2022              Requested by: Natalia Leatherwood, DO [address] [place],  [place] [address] PCP: Natalia Leatherwood, DO   Assessment & Plan: Visit Diagnoses:  1. Acute bilateral low back pain without sciatica   2. Other spondylosis with radiculopathy, lumbar region     Plan:   Follow-Up Instructions: Return in about 3 weeks (around 02/03/2022) for with dr yates to review lumbar spine mri and discuss treatment. .   Orders:  Orders Placed This Encounter  Procedures   XR Lumbar Spine 2-3 Views   No orders of the defined types were placed in this encounter.     Procedures: No procedures performed   Clinical Data: No additional findings.   Subjective: Chief Complaint  Patient presents with   Lower Back - Pain    HPI  Review of Systems   Objective: Vital Signs: BP 105/62    Pulse (!) 57    Ht 5\' 10"  (1.778 m)    Wt 216 lb (98 kg)    BMI 30.99 kg/m   Physical Exam  Ortho Exam  Specialty Comments:  No specialty comments available.  Imaging: No results found.   PMFS History: Patient Active Problem List   Diagnosis Date Noted   Vitamin D deficiency 11/12/2021   Mild concentric left ventricular hypertrophy (LVH) 06/09/2021   Encounter for screening involving social determinants of health (SDoH) 05/20/2021   Aortic stenosis, mild 05/20/2021   Aortic root dilatation (HCC) 05/20/2021   Facet hypertrophy of lumbosacral region 07/04/2019   Foraminal stenosis of lumbar region 07/04/2019   Mild nonproliferative diabetic retinopathy (HCC) 01/09/2019   Generalized anxiety disorder 09/02/2017   Vasculogenic erectile dysfunction 09/02/2017   Murmur 12/28/2015   Insomnia 12/28/2015   Diabetic peripheral neuropathy (HCC) 12/21/2015   Bulging of lumbar intervertebral disc 2017   Type 2 diabetes mellitus with hyperlipidemia (HCC) 07/07/2014    S/P coronary artery stent placement 03/31/2014   Coronary artery disease with history of myocardial infarction without history of CABG 03/31/2014   Essential hypertension 03/28/2014   Past Medical History:  Diagnosis Date   Alcohol addiction (HCC)    Anxiety    Arthritis    Bulging of lumbar intervertebral disc 2017   MRI: L3-4, L4-5 and L5-S1- facet hypertrophy and formainal stenosis   CAD (coronary artery disease)    Coronary artery disease with hx of myocardial infarct w/o hx of CABG 2004   Depression    Dextrocardia    Diabetes (HCC)    Heart attack (HCC) 2004   Stent placement 2008   Hyperlipidemia    Hypertension    Insomnia     Family History  Problem Relation Age of Onset   Heart attack Mother    Heart disease Mother    Diabetes Mother    Arthritis Mother    Hyperlipidemia Mother    Hypertension Mother    Breast cancer Mother    Heart attack Father    Heart disease Father    Alcohol abuse Father    Early death Father    Arthritis Sister    Heart disease Sister    Hyperlipidemia Sister    Asthma Sister    COPD Sister  Diabetes Sister    Alcohol abuse Sister     Past Surgical History:  Procedure Laterality Date   CORONARY ANGIOPLASTY WITH STENT PLACEMENT  2008   Social History   Occupational History   Occupation: Full time    Employer: UNEMPLOYED  Tobacco Use   Smoking status: Never   Smokeless tobacco: Never  Vaping Use   Vaping Use: Never used  Substance and Sexual Activity   Alcohol use: Yes    Alcohol/week: 0.0 standard drinks    Comment: Occasional   Drug use: Not Currently    Types: Cocaine, Marijuana    Comment: Former   Sexual activity: Not Currently    Partners: Female

## 2022-02-08 ENCOUNTER — Telehealth: Payer: Self-pay

## 2022-02-08 ENCOUNTER — Ambulatory Visit (INDEPENDENT_AMBULATORY_CARE_PROVIDER_SITE_OTHER): Payer: Medicare Other | Admitting: Orthopaedic Surgery

## 2022-02-08 ENCOUNTER — Other Ambulatory Visit: Payer: Self-pay

## 2022-02-08 ENCOUNTER — Other Ambulatory Visit: Payer: Self-pay | Admitting: Family Medicine

## 2022-02-08 VITALS — Ht 70.0 in | Wt 216.0 lb

## 2022-02-08 NOTE — Telephone Encounter (Signed)
Can you dictate this patient, we need it for MRI ?thanks ?

## 2022-02-09 NOTE — Progress Notes (Signed)
Pt had not had MRI , left without being seen with [facility] after MRI ?

## 2022-02-14 ENCOUNTER — Other Ambulatory Visit: Payer: Self-pay

## 2022-02-14 MED ORDER — ROSUVASTATIN CALCIUM 40 MG PO TABS: 40.0000 mg | ORAL_TABLET | Freq: Every day | ORAL | 0 refills | Status: DC

## 2022-03-02 ENCOUNTER — Ambulatory Visit (HOSPITAL_COMMUNITY): Payer: Medicare Other

## 2022-03-14 ENCOUNTER — Other Ambulatory Visit: Payer: Self-pay | Admitting: Cardiology

## 2022-03-15 ENCOUNTER — Ambulatory Visit (HOSPITAL_COMMUNITY)
Admission: RE | Admit: 2022-03-15 | Discharge: 2022-03-15 | Disposition: A | Discharge status: Home / Self Care | Payer: Medicare Other | Source: Ambulatory Visit | Attending: Orthopaedic Surgery | Admitting: Orthopaedic Surgery

## 2022-03-15 DIAGNOSIS — M5126 Other intervertebral disc displacement, lumbar region: Secondary | ICD-10-CM | POA: Diagnosis not present

## 2022-03-15 DIAGNOSIS — M545 Low back pain, unspecified: Secondary | ICD-10-CM | POA: Insufficient documentation

## 2022-03-15 DIAGNOSIS — M4726 Other spondylosis with radiculopathy, lumbar region: Secondary | ICD-10-CM | POA: Insufficient documentation

## 2022-03-15 DIAGNOSIS — M5136 Other intervertebral disc degeneration, lumbar region: Secondary | ICD-10-CM | POA: Diagnosis not present

## 2022-03-25 ENCOUNTER — Other Ambulatory Visit: Payer: Self-pay

## 2022-03-25 ENCOUNTER — Ambulatory Visit (INDEPENDENT_AMBULATORY_CARE_PROVIDER_SITE_OTHER): Payer: Medicare Other | Admitting: Orthopaedic Surgery

## 2022-03-25 DIAGNOSIS — M47817 Spondylosis without myelopathy or radiculopathy, lumbosacral region: Secondary | ICD-10-CM

## 2022-03-25 DIAGNOSIS — M4726 Other spondylosis with radiculopathy, lumbar region: Secondary | ICD-10-CM

## 2022-03-25 DIAGNOSIS — M48061 Spinal stenosis, lumbar region without neurogenic claudication: Secondary | ICD-10-CM | POA: Diagnosis not present

## 2022-03-25 DIAGNOSIS — M545 Low back pain, unspecified: Secondary | ICD-10-CM

## 2022-03-25 DIAGNOSIS — M51369 Other intervertebral disc degeneration, lumbar region without mention of lumbar back pain or lower extremity pain: Secondary | ICD-10-CM

## 2022-03-25 DIAGNOSIS — M5136 Other intervertebral disc degeneration, lumbar region: Secondary | ICD-10-CM
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
## 2022-04-27 ENCOUNTER — Encounter: Payer: Medicare Other | Admitting: Family Medicine

## 2022-04-27 DIAGNOSIS — I10 Essential (primary) hypertension: Secondary | ICD-10-CM

## 2022-04-27 NOTE — Patient Instructions (Signed)
No follow-ups on file.        Great to see you today.  I have refilled the medication(s) we provide.   If labs were collected, we will inform you of lab results once received either by echart message or telephone call.   - echart message- for normal results that have been seen by the patient already.   - telephone call: abnormal results or if patient has not viewed results in their echart.  

## 2022-04-27 NOTE — Progress Notes (Unsigned)
This visit occurred during the SARS-CoV-2 public health emergency.  Safety protocols were in place, including screening questions prior to the visit, additional usage of staff PPE, and extensive cleaning of exam room while observing appropriate contact time as indicated for disinfecting solutions.    [name] , 1956/03/24, 66 y.o., male MRN: [id number] Patient Care Team    Relationship Specialty Notifications Start End  Ma Hillock, DO PCP - General Family Medicine  11/29/18   Lelon Perla, MD PCP - Cardiology Cardiology  08/07/20   Bernarda Caffey, MD Consulting Physician Ophthalmology  01/09/19     No chief complaint on file.    Subjective: Pt presents for an OV for  Diabetes with complications/overweight:  Patient reports he has been a diabetic since 1998.  He reports with compliance Janumet/Metformin 1000-100 mg daily, Farxiga 10 mg daily and Lantus 40 units nightly. Patient denies dizziness, hyperglycemic or hypoglycemic events. Patient denies numbness, tingling in the extremities or nonhealing wounds of feet.   Patient denies numbness, tingling in the extremities or nonhealing wounds of feet.     Depression/anxiety/insomnia: Patient reports he is feeling  *** Valium 4 times daily and trazodone 100 mg nightly.    He had been on BuSpar as well which he originally thought was helpful, but then he felt that it was not as helpful.  [place] controlled substance database reviewed today. Original note: He states he has a rather routine day.  He does not have much social contact in a day.  He lives by himself.  He reports that he always feels like he "stays on edge.  "He reports when he wakes up he is already feeling on edge and he will take a Valium at that time.  He then has breakfast and goes on a 4 mile walk daily.  For lunch he starts to feel his anxiety sudden and he becomes panicked.  He states when he feels this way he will break out in a sweat, become very fearful  and scared.  He states he commonly fears there is someone in the house with a gun.  He goes to his bedroom gets in his bed. He states the fear is so intense and he just cannot control it.  He denies any known trauma or event experienced in his life that would create such a fear.  He states he did go see a counselor/psychiatrist at [facility] in the past 6 months, and he did not feel that was helpful for him.  Denies any SI or HI.   Hypertension/CAD/S/p Stent/prior MI:  Pt has a significant cardiac history of "anterior MI in 2004 with stents was LAD and then perhaps stents to branch vessel 2008 (by DR. Renaldo-cardio note 2015)" Patient reports compliance with lisinopril-HCTZ 20-25 mg daily and metoprolol 25 mg daily, Lovaza and Crestor.   He is compliant with baby aspirin 81 mg daily.  Plavix was DC'd by cards.  His statin was increased by cardiology to Crestor 40 mg daily.  Patient denies chest pain, shortness of breath, dizziness or lower extremity edema.   with metoprolol 25 mg daily Diet: Low-sodium Exercise: Routine exercise daily RF: Hypertension, hyperlipidemia, diabetes, overweight, family history, CAD, MI   Neck pain/back pain: Patient states *** Prior note: Patient has had recurrent posterior neck pain and headache.  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  He states he found an old prescription of  naproxen and restarted it the last time he had symptoms.  He states it worked well for him.  He is wondering if we could get refills for him today.  In the past he has stated he could not tolerate NSAIDs.     Erectile dysfunction: (Documentation only) patient would like to start a medication for erectile dysfunction.  He states he seen a urologist many years ago and does not desire to see a urologist again.  Patient has a significant medical history of blood pressure with systolic less than 578 and history of CAD and MI.   Last echo 03/06/2020:  1.  Technically difficult study. Left ventricular ejection fraction, by  estimation, is 45 to 50%. The left ventricle has grossly mildly decreased  function. Image quality is not sufficient to assess for regional wall  motion abnormalities. Left ventricular   diastolic parameters are indeterminate.   2. Right ventricle is poorly visualized. Grossly normal size and systolic  function. Tricuspid regurgitation signal is inadequate for assessing PA  pressure.   3. The mitral valve is normal in structure. No evidence of mitral valve  regurgitation.   4. Aortic dilatation noted. There is mild dilatation of the ascending  aorta measuring 37 mm.   5. The aortic valve is abnormal. Moderately calcified. Aortic valve  regurgitation is not visualized. Mild aortic valve stenosis. Vmax 2.2 m/s,  MG 10 mmHg, AVA 1.7 cm^2, DI 0.4      11/10/2021    2:10 PM 08/18/2021    8:38 AM 09/23/2020    2:57 PM 02/13/2020    8:34 AM 06/04/2019    3:26 PM  Depression screen PHQ 2/9  Decreased Interest 0 1 0 0 3  Down, Depressed, Hopeless 0 1 0 1 3  PHQ - 2 Score 0 2 0 1 6  Altered sleeping  0   3  Tired, decreased energy  0   3  Change in appetite  1   3  Feeling bad or failure about yourself   1   1  Trouble concentrating  0   0  Moving slowly or fidgety/restless  0   0  Suicidal thoughts  0   0  PHQ-9 Score  4   16  Difficult doing work/chores  Not difficult at all   Very difficult    Allergies  Allergen Reactions   Gabapentin Other (See Comments)    Paranoia/hallucinations   Hydrocodone Other (See Comments)    PT reports  Confusion when tacking Hydrocodone   Nsaids     History of heart disease, CAD with history of MI, CABG status post stent   Prednisone     Paranoia   Tramadol    Penicillins Other (See Comments)    Childhood unsure if still allgeric   Social History   Social History Narrative   Marital status/children/pets: Single   Education/employment: HS grad, 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:      -smoke alarm in the home:Yes     - wears seatbelt: Yes      Past Medical History:  Diagnosis Date   Alcohol addiction ([place])    Anxiety    Arthritis    Bulging of lumbar intervertebral disc 2017   MRI: L3-4, L4-5 and L5-S1- facet hypertrophy and formainal stenosis   CAD (coronary artery disease)    Coronary artery disease with hx of myocardial infarct w/o hx of CABG 2004   Depression    Dextrocardia    Diabetes ([place])  Heart attack ([place]) 2004   Stent placement 2008   Hyperlipidemia    Hypertension    Insomnia    Past Surgical History:  Procedure Laterality Date   CORONARY ANGIOPLASTY WITH STENT PLACEMENT  2008   Family History  Problem Relation Age of Onset   Heart attack Mother    Heart disease Mother    Diabetes Mother    Arthritis Mother    Hyperlipidemia Mother    Hypertension Mother    Breast cancer Mother    Heart attack Father    Heart disease Father    Alcohol abuse Father    Early death Father    Arthritis Sister    Heart disease Sister    Hyperlipidemia Sister    Asthma Sister    COPD Sister    Diabetes Sister    Alcohol abuse Sister    Allergies as of 04/27/2022       Reactions   Gabapentin Other (See Comments)   Paranoia/hallucinations   Hydrocodone Other (See Comments)   PT reports  Confusion when tacking Hydrocodone   Nsaids    History of heart disease, CAD with history of MI, CABG status post stent   Prednisone    Paranoia   Tramadol    Penicillins Other (See Comments)   Childhood unsure if still allgeric        Medication List        Accurate as of Apr 27, 2022 12:24 PM. If you have any questions, ask your nurse or doctor.          Accu-Chek FastClix Lancets Misc   aspirin EC 81 MG tablet Take 1 tablet (81 mg total) by mouth daily. What changed: how much to take   blood glucose meter kit and supplies Kit Dispense based on patient and insurance preference. Use up to four times daily as directed. DX E11.9    dapagliflozin propanediol 10 MG Tabs tablet Commonly known as: Farxiga TAKE 1 TABLET BY MOUTH  DAILY BEFORE BREAKFAST   diazepam 5 MG tablet Commonly known as: VALIUM Take 1 tablet (5 mg total) by mouth every 6 (six) hours as needed for anxiety.   GNP UltiCare Pen Needles 31G X 5 MM Misc Generic drug: Insulin Pen Needle AS DIRECTED ONCE DAILY   Janumet XR [phone] MG Tb24 Generic drug: SitaGLIPtin-MetFORMIN HCl Take 1 tablet by mouth daily.   Lantus SoloStar 100 UNIT/ML Solostar Pen Generic drug: insulin glargine Inject 40 units in the morning and 10 units in the evening.   lisinopril-hydrochlorothiazide 20-25 MG tablet Commonly known as: ZESTORETIC Take 1 tablet by mouth daily.   metoprolol succinate 25 MG 24 hr tablet Commonly known as: TOPROL-XL Take 1 tablet (25 mg total) by mouth daily.   naproxen 500 MG tablet Commonly known as: NAPROSYN Every 12 hours with food as needed only   nitroGLYCERIN 0.4 MG SL tablet Commonly known as: NITROSTAT DISSOLVE 1 TABLET UNDER THE TONGUE EVERY 5 MINUTES AS  NEEDED FOR CHEST PAIN. MAX  OF 3 TABLETS IN 15 MINUTES. CALL 911 IF PAIN PERSISTS.   omega-3 acid ethyl esters 1 g capsule Commonly known as: LOVAZA Take 1 capsule (1 g total) by mouth daily.   OneTouch Ultra test strip Generic drug: glucose blood Use as instructed   rosuvastatin 40 MG tablet Commonly known as: CRESTOR TAKE 1 TABLET BY MOUTH DAILY   traZODone 100 MG tablet Commonly known as: DESYREL Take 1 tablet (100 mg total) by mouth at bedtime as needed  for sleep.   Vitamin D (Ergocalciferol) 1.25 MG (50000 UNIT) Caps capsule Commonly known as: DRISDOL Take 1 capsule (50,000 Units total) by mouth every 7 (seven) days.        All past medical history, surgical history, allergies, family history, immunizations andmedications were updated in the EMR today and reviewed under the history and medication portions of their EMR.     ROS: Negative, with the  exception of above mentioned in HPI   Objective:  There were no vitals taken for this visit. There is no height or weight on file to calculate BMI. Physical Exam Vitals and nursing note reviewed.  Constitutional:      General: He is not in acute distress.    Appearance: Normal appearance. He is obese. He is not ill-appearing, toxic-appearing or diaphoretic.  HENT:     Head: Normocephalic and atraumatic.     Mouth/Throat:     Mouth: Mucous membranes are moist.  Eyes:     General: No scleral icterus.       Right eye: No discharge.        Left eye: No discharge.     Extraocular Movements: Extraocular movements intact.     Pupils: Pupils are equal, round, and reactive to light.  Cardiovascular:     Rate and Rhythm: Normal rate and regular rhythm.     Pulses: Normal pulses.     Heart sounds: Murmur heard.    No friction rub. No gallop.  Pulmonary:     Effort: Pulmonary effort is normal. No respiratory distress.     Breath sounds: Normal breath sounds. No wheezing, rhonchi or rales.  Musculoskeletal:        General: Normal range of motion.     Cervical back: Neck supple.  Lymphadenopathy:     Cervical: No cervical adenopathy.  Skin:    General: Skin is warm and dry.     Coloration: Skin is not jaundiced or pale.     Findings: No rash.  Neurological:     Mental Status: He is alert and oriented to person, place, and time. Mental status is at baseline.  Psychiatric:        Mood and Affect: Mood normal.        Behavior: Behavior normal.        Thought Content: Thought content normal.        Judgment: Judgment normal.      No results found. No results found. No results found for this or any previous visit (from the past 24 hour(s)).     Assessment/Plan: [name] is a 66 y.o. male present for OV for  Dyslipidemia associated with type 2 diabetes mellitus (HCC)/diabetic peripheral neuropathy/mild nonproliferative diabetic retinopathy of both eyes/overweight:  He has  not been following a diabetic diet.   Suspect diet, lack of exercise and alcohol consumption to be playing a factor in his inability to decrease his A1c despite increasing amounts of medications. -continue janumet [phone] daily -Continue glipizide 10 twice daily -Continue farixga 10 mg daily CMP, lipids collected today -Increase Lantus 40/10 units.    PNA series: Completed after 63 Flu shot:  UTD Foot exam: Completed 04/27/2022-referral to podiatry has also been in place and he is established Eye exam:  Completed 09/23/2020-[facility] *** A1c:10.4>>>9.4>>  9.7>>11.8>> 8.5 >>> 6.5 >>>7.1>7.6>7.7>8.7> 8.8 on 04/20/2021>8.3 > 8.7 > A1c ***today  Vit d def: *** Vit d collected today  Anxiety/depression/insomnia: *** Has a history of significant depression with anxiety and paranoid features. ***  Valium *** trazodone to 100 mg daily Meds tried in the past: BuSpar patient reports it was not effective.  Paxil patient reports made him more anxious. -  [place] controlled substance database reviewed    Patient aware he needs a face-to-face appointment in order for any refills to be processed -Contract signed -Offered referral to counseling or psychiatry; he declined. Follow-up required every 5.5 months    Hypertension/CAD/S/p Stent/prior MI/hypercholesteremia/aortic stenosis/aortic dilatation/LVH:  *** Pt has a significant cardiac history of "anterior MI in 2004 with stents was LAD and then stents to a branch vessel 2008 (by DR. Renaldo- cardio note 2015)" ***  lisinopril-HCTZ 20-25 mg daily -***  metoprolol 25 mg daily - continue aspirin 81 mg daily.  Cardiology discontinued Plavix  - continue  Crestor 40 mg daily Diet: Low-sodium Exercise: Routine exercise daily - now established with cardiology as well for routine follow up.    Reviewed expectations re: course of current medical issues. Discussed self-management of symptoms. Outlined signs and symptoms indicating need for more  acute intervention. Patient verbalized understanding and all questions were answered. Patient received an After-Visit Summary.    No orders of the defined types were placed in this encounter.   No orders of the defined types were placed in this encounter.    Referral Orders  No referral(s) requested today      Note is dictated utilizing voice recognition software. Although note has been proof read prior to signing, occasional typographical errors still can be missed. If any questions arise, please do not hesitate to call for verification.   electronically signed by:  Howard Pouch, DO  [place]

## 2022-04-29 ENCOUNTER — Ambulatory Visit (INDEPENDENT_AMBULATORY_CARE_PROVIDER_SITE_OTHER): Payer: Medicare Other | Admitting: Family Medicine

## 2022-04-29 ENCOUNTER — Encounter: Payer: Self-pay | Admitting: Family Medicine

## 2022-04-29 VITALS — BP 88/36 | HR 55 | Temp 98.0°F | Ht 70.0 in | Wt 208.0 lb

## 2022-04-29 DIAGNOSIS — Z1211 Encounter for screening for malignant neoplasm of colon: Secondary | ICD-10-CM

## 2022-04-29 DIAGNOSIS — Z955 Presence of coronary angioplasty implant and graft: Secondary | ICD-10-CM

## 2022-04-29 DIAGNOSIS — E1169 Type 2 diabetes mellitus with other specified complication: Secondary | ICD-10-CM

## 2022-04-29 DIAGNOSIS — E113293 Type 2 diabetes mellitus with mild nonproliferative diabetic retinopathy without macular edema, bilateral: Secondary | ICD-10-CM | POA: Diagnosis not present

## 2022-04-29 DIAGNOSIS — R413 Other amnesia: Secondary | ICD-10-CM

## 2022-04-29 DIAGNOSIS — F5101 Primary insomnia: Secondary | ICD-10-CM | POA: Diagnosis not present

## 2022-04-29 DIAGNOSIS — E559 Vitamin D deficiency, unspecified: Secondary | ICD-10-CM

## 2022-04-29 DIAGNOSIS — I77810 Thoracic aortic ectasia: Secondary | ICD-10-CM

## 2022-04-29 DIAGNOSIS — E1142 Type 2 diabetes mellitus with diabetic polyneuropathy: Secondary | ICD-10-CM | POA: Diagnosis not present

## 2022-04-29 DIAGNOSIS — I10 Essential (primary) hypertension: Secondary | ICD-10-CM

## 2022-04-29 DIAGNOSIS — E785 Hyperlipidemia, unspecified: Secondary | ICD-10-CM | POA: Diagnosis not present

## 2022-04-29 DIAGNOSIS — F411 Generalized anxiety disorder: Secondary | ICD-10-CM

## 2022-04-29 DIAGNOSIS — I2510 Atherosclerotic heart disease of native coronary artery without angina pectoris: Secondary | ICD-10-CM | POA: Diagnosis not present

## 2022-04-29 DIAGNOSIS — I252 Old myocardial infarction: Secondary | ICD-10-CM

## 2022-04-29 LAB — POCT GLYCOSYLATED HEMOGLOBIN (HGB A1C)
HbA1c POC (<> result, manual entry): 7.4 % (ref 4.0–5.6)
HbA1c, POC (controlled diabetic range): 7.4 % — AB (ref 0.0–7.0)
HbA1c, POC (prediabetic range): 7.4 % — AB (ref 5.7–6.4)
Hemoglobin A1C: 7.4 % — AB (ref 4.0–5.6)

## 2022-04-29 LAB — MICROALBUMIN / CREATININE URINE RATIO
Creatinine,U: 125.3 mg/dL
Microalb Creat Ratio: 3.6 mg/g (ref 0.0–30.0)
Microalb, Ur: 4.6 mg/dL — ABNORMAL HIGH (ref 0.0–1.9)

## 2022-04-29 MED ORDER — DIAZEPAM 5 MG PO TABS: 5.0000 mg | ORAL_TABLET | Freq: Four times a day (QID) | ORAL | 5 refills | Status: DC | PRN

## 2022-04-29 MED ORDER — NAPROXEN 500 MG PO TABS: ORAL_TABLET | ORAL | 1 refills | Status: DC

## 2022-04-29 MED ORDER — LANTUS SOLOSTAR 100 UNIT/ML ~~LOC~~ SOPN: PEN_INJECTOR | SUBCUTANEOUS | 2 refills | Status: DC

## 2022-04-29 MED ORDER — ROSUVASTATIN CALCIUM 40 MG PO TABS
40.0000 mg | ORAL_TABLET | Freq: Every day | ORAL | 3 refills | Status: DC
Start: 2022-04-29 — End: 2022-08-09

## 2022-04-29 MED ORDER — METOPROLOL SUCCINATE ER 25 MG PO TB24: 25.0000 mg | ORAL_TABLET | Freq: Every day | ORAL | 1 refills | Status: DC

## 2022-04-29 MED ORDER — TRAZODONE HCL 100 MG PO TABS: 100.0000 mg | ORAL_TABLET | Freq: Every evening | ORAL | 1 refills | Status: DC | PRN

## 2022-04-29 MED ORDER — DAPAGLIFLOZIN PROPANEDIOL 10 MG PO TABS: ORAL_TABLET | ORAL | 1 refills | Status: DC

## 2022-04-29 MED ORDER — LISINOPRIL 20 MG PO TABS: 20.0000 mg | ORAL_TABLET | Freq: Every day | ORAL | 1 refills | Status: DC

## 2022-04-29 MED ORDER — JANUMET XR 100-1000 MG PO TB24: 1.0000 | ORAL_TABLET | Freq: Every day | ORAL | 1 refills | Status: DC

## 2022-04-29 MED ORDER — OMEGA-3-ACID ETHYL ESTERS 1 G PO CAPS: 1.0000 | ORAL_CAPSULE | Freq: Every day | ORAL | 2 refills | Status: DC

## 2022-04-29 MED ORDER — GNP ULTICARE PEN NEEDLES 31G X 5 MM MISC: 3 refills | Status: DC

## 2022-04-29 NOTE — Patient Instructions (Signed)

## 2022-04-29 NOTE — Progress Notes (Signed)
[name] , 02/23/1956, 66 y.o., male MRN: [id number] Patient Care Team    Relationship Specialty Notifications Start End  Ma Hillock, DO PCP - General Family Medicine  11/29/18   Lelon Perla, MD PCP - Cardiology Cardiology  08/07/20   Bernarda Caffey, MD Consulting Physician Ophthalmology  01/09/19     Chief Complaint  Patient presents with   Diabetes    [facility]; pt is fasting     Subjective: Pt presents for an OV for  Diabetes with complications/overweight:  Patient reports he has been a diabetic since 1998.  He reports with compliance Janumet/Metformin 1000-100 mg daily, Farxiga 10 mg daily and Lantus 50 units daily. Patient denies dizziness, hyperglycemic or hypoglycemic events. Patient denies numbness, tingling in the extremities or nonhealing wounds of feet.   Depression/anxiety/insomnia: Patient reports he is feeling well with Valium 4 times daily and trazodone 100 mg nightly.   He had been on BuSpar as well which he originally thought was helpful, but then he felt that it was not as helpful.  [place] controlled substance database reviewed today. Original note: He states he has a rather routine day.  He does not have much social contact in a day.  He lives by himself.  He reports that he always feels like he "stays on edge.  "He reports when he wakes up he is already feeling on edge and he will take a Valium at that time.  He then has breakfast and goes on a 4 mile walk daily.  For lunch he starts to feel his anxiety sudden and he becomes panicked.  He states when he feels this way he will break out in a sweat, become very fearful and scared.  He states he commonly fears there is someone in the house with a gun.  He goes to his bedroom gets in his bed. He states the fear is so intense and he just cannot control it.  He denies any known trauma or event experienced in his life that would create such a fear.  He states he did go see a counselor/psychiatrist at  [facility] in the past 6 months, and he did not feel that was helpful for him.  Denies any SI or HI.   Hypertension/CAD/S/p Stent/prior MI:  Pt has a significant cardiac history of "anterior MI in 2004 with stents was LAD and then perhaps stents to branch vessel 2008 (by DR. Renaldo-cardio note 2015)" Patient reports compliance with lisinopril-HCTZ 20-25 mg daily and metoprolol 25 mg daily, Lovaza and Crestor.   He is compliant with baby aspirin 81 mg daily.  Plavix was DC'd by cards.  His statin was increased by cardiology to Crestor 40 mg daily.  Patient reports compliance. Patient denies chest pain, shortness of breath, dizziness or lower extremity edema.  Diet: Low-sodium Exercise: Routine exercise daily RF: Hypertension, hyperlipidemia, diabetes, overweight, family history, CAD, MI   Neck pain/back pain: pt reports about the same. Still using naproxen as needed.  Prior note: Patient has had recurrent posterior neck pain and headache.  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  He states he found an old prescription of naproxen and restarted it the last time he had symptoms.  He states it worked well for him.  He is wondering if we could get refills for him today.  In the past he has stated he could not tolerate NSAIDs.  Erectile dysfunction: (Documentation only) patient would like to start a medication for erectile dysfunction.  He states he seen a urologist many years ago and does not desire to see a urologist again.  Patient has a significant medical history of blood pressure with systolic less than 263 and history of CAD and MI.   Last echo 03/06/2020:  1. Technically difficult study. Left ventricular ejection fraction, by  estimation, is 45 to 50%. The left ventricle has grossly mildly decreased  function. Image quality is not sufficient to assess for regional wall  motion abnormalities. Left ventricular   diastolic parameters are  indeterminate.   2. Right ventricle is poorly visualized. Grossly normal size and systolic  function. Tricuspid regurgitation signal is inadequate for assessing PA  pressure.   3. The mitral valve is normal in structure. No evidence of mitral valve  regurgitation.   4. Aortic dilatation noted. There is mild dilatation of the ascending  aorta measuring 37 mm.   5. The aortic valve is abnormal. Moderately calcified. Aortic valve  regurgitation is not visualized. Mild aortic valve stenosis. Vmax 2.2 m/s,  MG 10 mmHg, AVA 1.7 cm^2, DI 0.4      11/10/2021    2:10 PM 08/18/2021    8:38 AM 09/23/2020    2:57 PM 02/13/2020    8:34 AM 06/04/2019    3:26 PM  Depression screen PHQ 2/9  Decreased Interest 0 1 0 0 3  Down, Depressed, Hopeless 0 1 0 1 3  PHQ - 2 Score 0 2 0 1 6  Altered sleeping  0   3  Tired, decreased energy  0   3  Change in appetite  1   3  Feeling bad or failure about yourself   1   1  Trouble concentrating  0   0  Moving slowly or fidgety/restless  0   0  Suicidal thoughts  0   0  PHQ-9 Score  4   16  Difficult doing work/chores  Not difficult at all   Very difficult    Allergies  Allergen Reactions   Gabapentin Other (See Comments)    Paranoia/hallucinations   Hydrocodone Other (See Comments)    PT reports  Confusion when tacking Hydrocodone   Nsaids     History of heart disease, CAD with history of MI, CABG status post stent   Prednisone     Paranoia   Tramadol    Penicillins Other (See Comments)    Childhood unsure if still allgeric   Social History   Social History Narrative   Marital status/children/pets: Single   Education/employment: HS grad, 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:      -smoke alarm in the home:Yes     - wears seatbelt: Yes      Past Medical History:  Diagnosis Date   Alcohol addiction ([place])    Anxiety    Arthritis    Bulging of lumbar intervertebral disc 2017   MRI: L3-4, L4-5 and L5-S1- facet hypertrophy and formainal stenosis    CAD (coronary artery disease)    Coronary artery disease with hx of myocardial infarct w/o hx of CABG 2004   Depression    Dextrocardia    Diabetes ([place])    Heart attack ([place]) 2004   Stent placement 2008   Hyperlipidemia    Hypertension    Insomnia    Past Surgical History:  Procedure Laterality Date   CORONARY ANGIOPLASTY WITH STENT PLACEMENT  2008   Family History  Problem Relation Age of Onset   Heart attack Mother    Heart disease Mother    Diabetes Mother    Arthritis Mother    Hyperlipidemia Mother    Hypertension Mother    Breast cancer Mother    Heart attack Father    Heart disease Father    Alcohol abuse Father    Early death Father    Arthritis Sister    Heart disease Sister    Hyperlipidemia Sister    Asthma Sister    COPD Sister    Diabetes Sister    Alcohol abuse Sister    Allergies as of 04/29/2022       Reactions   Gabapentin Other (See Comments)   Paranoia/hallucinations   Hydrocodone Other (See Comments)   PT reports  Confusion when tacking Hydrocodone   Nsaids    History of heart disease, CAD with history of MI, CABG status post stent   Prednisone    Paranoia   Tramadol    Penicillins Other (See Comments)   Childhood unsure if still allgeric        Medication List        Accurate as of April 29, 2022 11:59 PM. If you have any questions, ask your nurse or doctor.          STOP taking these medications    lisinopril-hydrochlorothiazide 20-25 MG tablet Commonly known as: ZESTORETIC Stopped by: Howard Pouch, DO       TAKE these medications    Accu-Chek FastClix Lancets Misc   aspirin EC 81 MG tablet Take 1 tablet (81 mg total) by mouth daily. What changed: how much to take   blood glucose meter kit and supplies Kit Dispense based on patient and insurance preference. Use up to four times daily as directed. DX E11.9   dapagliflozin propanediol 10 MG Tabs tablet Commonly known as: Farxiga TAKE 1 TABLET BY MOUTH  DAILY  BEFORE BREAKFAST   diazepam 5 MG tablet Commonly known as: VALIUM Take 1 tablet (5 mg total) by mouth every 6 (six) hours as needed for anxiety.   GNP UltiCare Pen Needles 31G X 5 MM Misc Generic drug: Insulin Pen Needle AS DIRECTED ONCE DAILY   Janumet XR [id number] MG Tb24 Generic drug: SitaGLIPtin-MetFORMIN HCl Take 1 tablet by mouth daily.   Lantus SoloStar 100 UNIT/ML Solostar Pen Generic drug: insulin glargine Inject 40 units in the morning and 10 units in the evening.   lisinopril 20 MG tablet Commonly known as: ZESTRIL Take 1 tablet (20 mg total) by mouth daily. Started by: Howard Pouch, DO   metoprolol succinate 25 MG 24 hr tablet Commonly known as: TOPROL-XL Take 1 tablet (25 mg total) by mouth daily.   naproxen 500 MG tablet Commonly known as: NAPROSYN Every 12 hours with food as needed only   nitroGLYCERIN 0.4 MG SL tablet Commonly known as: NITROSTAT DISSOLVE 1 TABLET UNDER THE TONGUE EVERY 5 MINUTES AS  NEEDED FOR CHEST PAIN. MAX  OF 3 TABLETS IN 15 MINUTES. CALL 911 IF PAIN PERSISTS.   omega-3 acid ethyl esters 1 g capsule Commonly known as: LOVAZA Take 1 capsule (1 g total) by mouth daily.   OneTouch Ultra test strip Generic drug: glucose blood Use as instructed   rosuvastatin 40 MG tablet Commonly known as: CRESTOR Take 1 tablet (40 mg total) by mouth at bedtime. What changed: when to take this Changed by: Howard Pouch, DO   traZODone 100 MG tablet Commonly known as: DESYREL Take  1 tablet (100 mg total) by mouth at bedtime as needed for sleep.   Vitamin D (Ergocalciferol) 1.25 MG (50000 UNIT) Caps capsule Commonly known as: DRISDOL Take 1 capsule (50,000 Units total) by mouth every 7 (seven) days.        All past medical history, surgical history, allergies, family history, immunizations andmedications were updated in the EMR today and reviewed under the history and medication portions of their EMR.     ROS: Negative, with the exception of  above mentioned in HPI   Objective:  BP (!) 88/36   Pulse (!) 55   Temp 98 F (36.7 C) (Oral)   Ht 5' 10" (1.778 m)   Wt 208 lb (94.3 kg)   SpO2 96%   BMI 29.84 kg/m  Body mass index is 29.84 kg/m. Physical Exam Vitals and nursing note reviewed.  Constitutional:      General: He is not in acute distress.    Appearance: Normal appearance. He is obese. He is not ill-appearing, toxic-appearing or diaphoretic.  HENT:     Head: Normocephalic and atraumatic.  Eyes:     General: No scleral icterus.       Right eye: No discharge.        Left eye: No discharge.     Extraocular Movements: Extraocular movements intact.     Pupils: Pupils are equal, round, and reactive to light.  Cardiovascular:     Rate and Rhythm: Regular rhythm. Bradycardia present.     Pulses: Normal pulses.     Heart sounds: Murmur heard.    No friction rub. No gallop.  Pulmonary:     Effort: Pulmonary effort is normal. No respiratory distress.     Breath sounds: Normal breath sounds. No wheezing, rhonchi or rales.  Musculoskeletal:        General: Normal range of motion.     Cervical back: Neck supple.     Right lower leg: No edema.     Left lower leg: No edema.  Lymphadenopathy:     Cervical: No cervical adenopathy.  Skin:    General: Skin is warm and dry.     Coloration: Skin is not jaundiced or pale.     Findings: No rash.  Neurological:     Mental Status: He is alert and oriented to person, place, and time. Mental status is at baseline.  Psychiatric:        Mood and Affect: Mood normal.        Behavior: Behavior normal.        Thought Content: Thought content normal.        Judgment: Judgment normal.      No results found. No results found. No results found for this or any previous visit (from the past 24 hour(s)).      Assessment/Plan: [name] is a 66 y.o. male present for OV for  Dyslipidemia associated with type 2 diabetes mellitus (HCC)/diabetic peripheral neuropathy/mild  nonproliferative diabetic retinopathy of both eyes/overweight:  He has not been following a diabetic diet> but "doing better" Suspect diet, lack of exercise and alcohol consumption to be playing a factor in his inability to decrease his A1c despite increasing amounts of medications. He reports he cut back on the bourbon now and has been eating better. -Continue janumet [phone] daily -Continue farixga 10 mg daily -continue  Lantus 40/10 units.    -Could consider adding on Ozempic PNA series: Completed after 63 Flu shot: Encouraged yearly Foot exam: 04/29/2022 Eye exam:  Completed 09/23/2020-[facility]-referred back to them today. A1c:10.4>>>9.4>>  9.7>>11.8>> 8.5 >>> 6.5 >>>7.1>7.6>7.7>8.7> 8.8 on 04/20/2021>8.3 > 8.7 > 7.4 today  Anxiety/depression/insomnia: Stable Has a history of significant depression with anxiety and paranoid features. Continue Valium Continue trazodone to 100 mg daily Meds tried in the past: BuSpar patient reports it was not effective.  Paxil patient reports made him more anxious. -  [place] controlled substance database reviewed    Patient aware he needs a face-to-face appointment in order for any refills to be processed -Contract signed -Offered referral to counseling or psychiatry; he declined. Follow-up required every 5.5 months    Hypertension/CAD/S/p Stent/prior MI/hypercholesteremia/aortic stenosis/aortic dilatation/LVH:  Blood pressures are actually low today.  He is asymptomatic. Pt has a significant cardiac history of "anterior MI in 2004 with stents was LAD and then stents to a branch vessel 2008 (by DR. Renaldo- cardio note 2015)" Decrease lisinopril to 20 mg.  (Just removed HCTZ portion) if needed next appointment would also start decreasing the lisinopril as well. -Continue metoprolol 25 mg daily> may be able to go to half dose only bradycardic today - continue aspirin 81 mg daily.  Cardiology discontinued Plavix  -continue Crestor 40 mg  daily Diet: Low-sodium Exercise: Routine exercise daily - now established with cardiology as well for routine follow up.   Memory changes/vitamin D deficiency: He has been taking vitamin D supplement OTC, he is not certain the exact amount he is taking. He has not noticed any changes in his memory. He is on a benzodiazepine and trazodone which could be contributing to his memory decline, especially when he is consuming alcohol.  This was discussed with him today. He was strongly encouraged not to consume alcohol.  If he is expecting could to consume alcohol he is to avoid taking the Valium. Ammonia levels collected today  Colon cancer screening: Went ahead and ordered his Cologuard testing.  Still encouraged him to follow-up for his Medicare wellness.  Reviewed expectations re: course of current medical issues. Discussed self-management of symptoms. Outlined signs and symptoms indicating need for more acute intervention. Patient verbalized understanding and all questions were answered. Patient received an After-Visit Summary.    Orders Placed This Encounter  Procedures   Vitamin D (25 hydroxy)   Microalbumin / creatinine urine ratio   Comp Met (CMET)   TSH   Ammonia   Cologuard   Ambulatory referral to Ophthalmology   POCT HgB A1C    Meds ordered this encounter  Medications   lisinopril (ZESTRIL) 20 MG tablet    Sig: Take 1 tablet (20 mg total) by mouth daily.    Dispense:  90 tablet    Refill:  1    DC combo lisinopril-hctz please   traZODone (DESYREL) 100 MG tablet    Sig: Take 1 tablet (100 mg total) by mouth at bedtime as needed for sleep.    Dispense:  90 tablet    Refill:  1   rosuvastatin (CRESTOR) 40 MG tablet    Sig: Take 1 tablet (40 mg total) by mouth at bedtime.    Dispense:  90 tablet    Refill:  3   omega-3 acid ethyl esters (LOVAZA) 1 g capsule    Sig: Take 1 capsule (1 g total) by mouth daily.    Dispense:  90 capsule    Refill:  2    Requesting 1  year supply   naproxen (NAPROSYN) 500 MG tablet    Sig: Every 12 hours with food as needed  only    Dispense:  180 tablet    Refill:  1   metoprolol succinate (TOPROL-XL) 25 MG 24 hr tablet    Sig: Take 1 tablet (25 mg total) by mouth daily.    Dispense:  90 tablet    Refill:  1   JANUMET XR [phone] MG TB24    Sig: Take 1 tablet by mouth daily.    Dispense:  90 tablet    Refill:  1   dapagliflozin propanediol (FARXIGA) 10 MG TABS tablet    Sig: TAKE 1 TABLET BY MOUTH  DAILY BEFORE BREAKFAST    Dispense:  90 tablet    Refill:  1    Requesting 1 year supply> no   diazepam (VALIUM) 5 MG tablet    Sig: Take 1 tablet (5 mg total) by mouth every 6 (six) hours as needed for anxiety.    Dispense:  120 tablet    Refill:  5   GNP ULTICARE PEN NEEDLES 31G X 5 MM MISC    Sig: AS DIRECTED ONCE DAILY    Dispense:  100 each    Refill:  3   insulin glargine (LANTUS SOLOSTAR) 100 UNIT/ML Solostar Pen    Sig: Inject 40 units in the morning and 10 units in the evening.    Dispense:  60 mL    Refill:  2    Increase in management.     Referral Orders         Ambulatory referral to Ophthalmology        Note is dictated utilizing voice recognition software. Although note has been proof read prior to signing, occasional typographical errors still can be missed. If any questions arise, please do not hesitate to call for verification.   electronically signed by:  Howard Pouch, DO  [place]

## 2022-04-30 LAB — COMPREHENSIVE METABOLIC PANEL
AG Ratio: 1.6 (calc) (ref 1.0–2.5)
ALT: 34 U/L (ref 9–46)
AST: 27 U/L (ref 10–35)
Albumin: 4.4 g/dL (ref 3.6–5.1)
Alkaline phosphatase (APISO): 35 U/L (ref 35–144)
BUN: 19 mg/dL (ref 7–25)
CO2: 22 mmol/L (ref 20–32)
Calcium: 9.6 mg/dL (ref 8.6–10.3)
Chloride: 104 mmol/L (ref 98–110)
Creat: 0.94 mg/dL (ref 0.70–1.35)
Globulin: 2.7 g/dL (calc) (ref 1.9–3.7)
Glucose, Bld: 69 mg/dL (ref 65–99)
Potassium: 3.9 mmol/L (ref 3.5–5.3)
Sodium: 139 mmol/L (ref 135–146)
Total Bilirubin: 0.4 mg/dL (ref 0.2–1.2)
Total Protein: 7.1 g/dL (ref 6.1–8.1)

## 2022-04-30 LAB — TSH: TSH: 2.43 mIU/L (ref 0.40–4.50)

## 2022-04-30 LAB — VITAMIN D 25 HYDROXY (VIT D DEFICIENCY, FRACTURES): Vit D, 25-Hydroxy: 50 ng/mL (ref 30–100)

## 2022-04-30 LAB — AMMONIA: Ammonia: 60 umol/L (ref <=72)

## 2022-05-02 ENCOUNTER — Telehealth: Payer: Self-pay | Admitting: Family Medicine

## 2022-05-02 NOTE — Telephone Encounter (Signed)
Please inform patient: His liver, kidney and electrolyte levels are normal. His thyroid levels are normal. His vitamin D levels are much better at 50.  I would encourage him to continue 2 tabs of D3 1000 units vitamin D3 daily.  His ammonia level is in the higher normal range.  I tested this level secondary to his memory decline.  Ammonia levels become elevated with liver disorders and/or when consuming alcohol with meds like his Valium.  I suspect some of his memory changes is secondary to Valium use, especially when consuming alcohol.  I encouraged him to avoid drinking alcohol and if desiring to drink alcohol he needs to avoid taking Valium on those days.

## 2022-05-02 NOTE — Telephone Encounter (Signed)
Spoke with pt regarding labs and instructions.   

## 2022-05-04 ENCOUNTER — Ambulatory Visit: Payer: Self-pay

## 2022-05-04 ENCOUNTER — Encounter: Payer: Self-pay | Admitting: Physical Medicine and Rehabilitation

## 2022-05-04 ENCOUNTER — Ambulatory Visit (INDEPENDENT_AMBULATORY_CARE_PROVIDER_SITE_OTHER): Payer: Medicare Other | Admitting: Physical Medicine and Rehabilitation

## 2022-05-04 VITALS — BP 131/68 | HR 58

## 2022-05-04 DIAGNOSIS — M5416 Radiculopathy, lumbar region: Secondary | ICD-10-CM | POA: Diagnosis not present

## 2022-05-04 MED ORDER — METHYLPREDNISOLONE ACETATE 80 MG/ML IJ SUSP
80.0000 mg | Freq: Once | INTRAMUSCULAR | Status: AC
  Administered 2022-05-04: 80 mg

## 2022-05-04 NOTE — Progress Notes (Unsigned)
Pt state lower back pain that travels down his left leg. Pt state walking, standing or cleaning house makes the pain worse. Pt state he takes over the counter pain meds to help ease his pain.   Numeric Pain Rating Scale and Functional Assessment Average Pain 7   In the last MONTH (on 0-10 scale) has pain interfered with the following?  1. General activity like being  able to carry out your everyday physical activities such as walking, climbing stairs, carrying groceries, or moving a chair?  Rating(10)   +Driver, +BT, -Dye Allergies.

## 2022-05-04 NOTE — Patient Instructions (Signed)

## 2022-05-10 ENCOUNTER — Ambulatory Visit: Payer: Medicare Other | Admitting: Orthopaedic Surgery

## 2022-06-17 ENCOUNTER — Telehealth: Payer: Self-pay

## 2022-06-17 ENCOUNTER — Other Ambulatory Visit: Payer: Self-pay | Admitting: Physical Medicine and Rehabilitation

## 2022-06-17 NOTE — Telephone Encounter (Signed)
Unfortunately, no, there is not a pain medication that can be prescribed that is stronger than naproxen.

## 2022-06-17 NOTE — Telephone Encounter (Signed)
Please advise 

## 2022-06-17 NOTE — Telephone Encounter (Signed)
Pt wanting repeat his inj L. L4 TF he had on 05/04/22. Pt state he recently has been excising and his pain has returned. Pt state he has pain on both side of his back. Pt had 75% relief with inj. Pt state he needs a refill on his rx. Please advise.

## 2022-06-17 NOTE — Telephone Encounter (Signed)
Spoke with patient regarding results/recommendations.  

## 2022-06-17 NOTE — Telephone Encounter (Signed)
Patient states that he is a lot of back pain.  Naproxen is not working.  He is going to schedule another appt with Orthopaedic to get injection but they are unable to get him in for a few weeks.  Patient is scheduled to see Dr. Claiborne Billings in September. Is there another medication she would be stronger than Naproxen?  Asking for any suggestions until he can get in for an appt with Dr. Claiborne Billings.   Please follow up with patient today [phone]

## 2022-06-20 ENCOUNTER — Telehealth: Payer: Self-pay | Admitting: Physical Medicine and Rehabilitation

## 2022-06-20 NOTE — Telephone Encounter (Signed)
[name] is calling to follow up on  the next steps  for treating his back. Please advise

## 2022-06-27 ENCOUNTER — Telehealth: Payer: Self-pay

## 2022-06-27 NOTE — Telephone Encounter (Signed)
A user error has taken place: encounter opened in error, closed for administrative reasons.

## 2022-06-29 NOTE — Progress Notes (Signed)
[facility] Quality Team Note  Name: [name] Date of Birth: 04-14-1956 MRN: [id number] Date: 06/29/2022  [facility] Quality Team has reviewed this patient's chart, please see recommendations below:  Diabetic Retinal Eye Exam; Patient requests [facility] Quality Coordinator to schedule Diabetic Retinal Screening at [facility] 07/13/2022 11:00am).

## 2022-07-11 ENCOUNTER — Ambulatory Visit (INDEPENDENT_AMBULATORY_CARE_PROVIDER_SITE_OTHER): Payer: Medicare Other | Admitting: Physical Medicine and Rehabilitation

## 2022-07-11 ENCOUNTER — Ambulatory Visit: Payer: Self-pay

## 2022-07-11 ENCOUNTER — Encounter: Payer: Self-pay | Admitting: Physical Medicine and Rehabilitation

## 2022-07-11 VITALS — BP 117/66 | HR 57

## 2022-07-11 DIAGNOSIS — M5416 Radiculopathy, lumbar region: Secondary | ICD-10-CM

## 2022-07-11 MED ORDER — METHYLPREDNISOLONE ACETATE 80 MG/ML IJ SUSP
80.0000 mg | Freq: Once | INTRAMUSCULAR | Status: AC
  Administered 2022-07-11: 80 mg

## 2022-07-11 NOTE — Progress Notes (Signed)
Pt state lower back pain. Pt state walking, standing or cleaning house makes the pain worse. Pt state he takes over the counter pain meds to help ease his pain.   Numeric Pain Rating Scale and Functional Assessment Average Pain 5   In the last MONTH (on 0-10 scale) has pain interfered with the following?  1. General activity like being  able to carry out your everyday physical activities such as walking, climbing stairs, carrying groceries, or moving a chair?  Rating(7)   +Driver, -BT, -Dye Allergies.

## 2022-07-13 LAB — HM DIABETES EYE EXAM

## 2022-07-17 IMAGING — MR MR LUMBAR SPINE W/O CM
5 series · 31 of 48 positions shown · non-contrast
Comparison: MRI lumbar spine dated February 11, 2016.

CLINICAL DATA: Spinal stenosis, increasing lumbar pain.

EXAM:
MRI LUMBAR SPINE WITHOUT CONTRAST
TECHNIQUE: Multiplanar, multisequence MR imaging of the lumbar spine was
performed. No intravenous contrast was administered.

[Series 5: T2 · sagittal · 4.0mm · 0.68mm/px · 6 of 16 slices shown (1 of 2)]
[im 1/16]
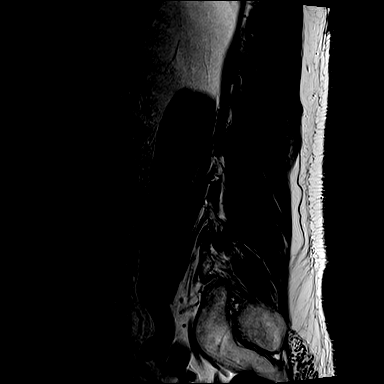
[im 4/16]
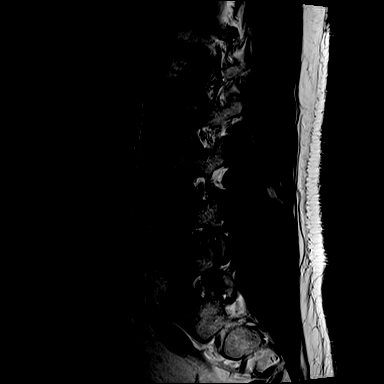
[im 7/16]
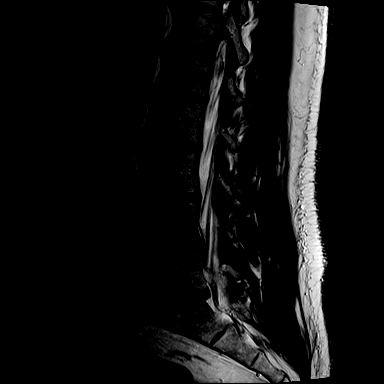
[im 10/16]
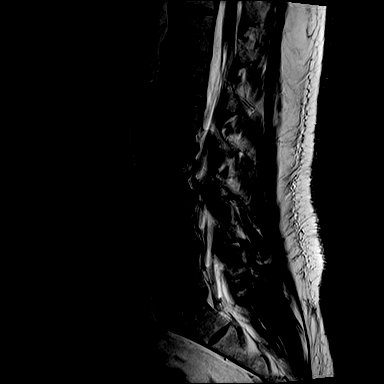
[im 13/16]
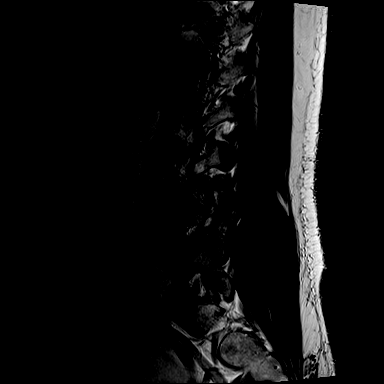
[im 16/16]
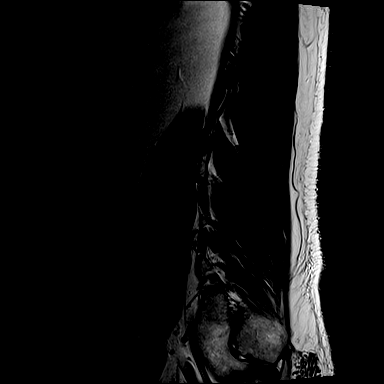

[Series 6: T1 · sagittal · 4.0mm · 0.81mm/px · 7 of 15 slices shown (1 of 2)]
[im 1/15]
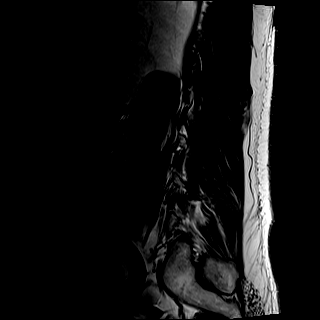
[im 3/15]
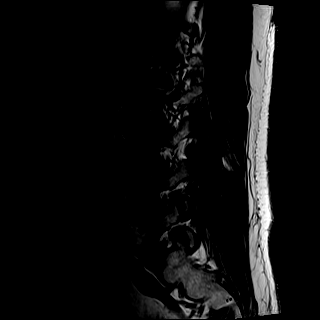
[im 5/15]
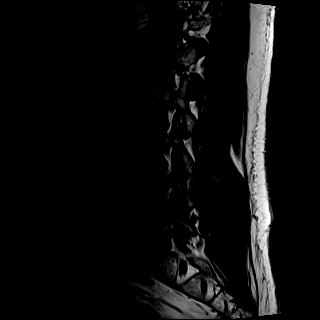
[im 8/15]
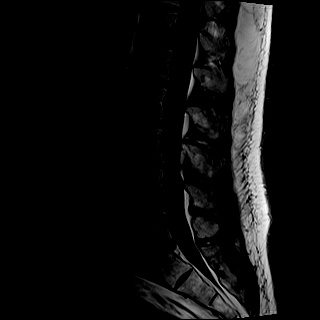
[im 10/15]
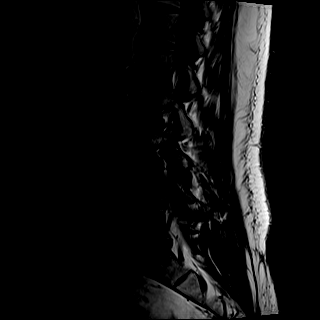
[im 12/15]
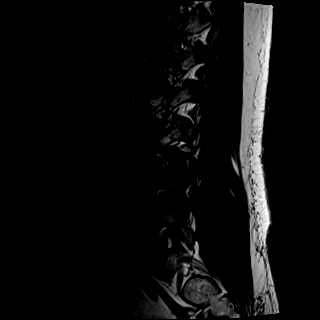
[im 15/15]
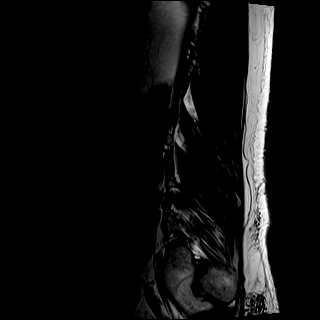

[Series 7: STIR · sagittal · 4.0mm · 0.51mm/px · 2 of 15 slices shown]
[im 1/15]
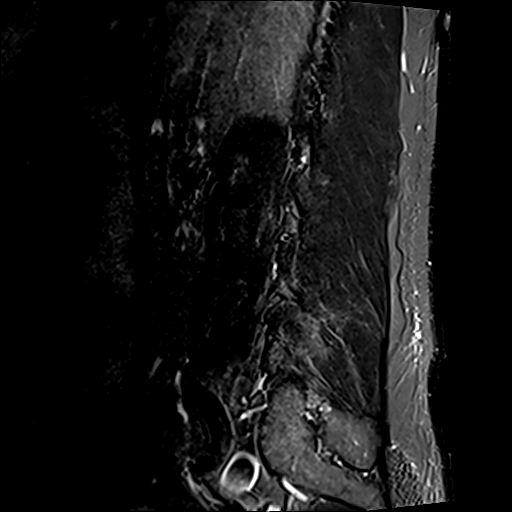
[im 3/15]
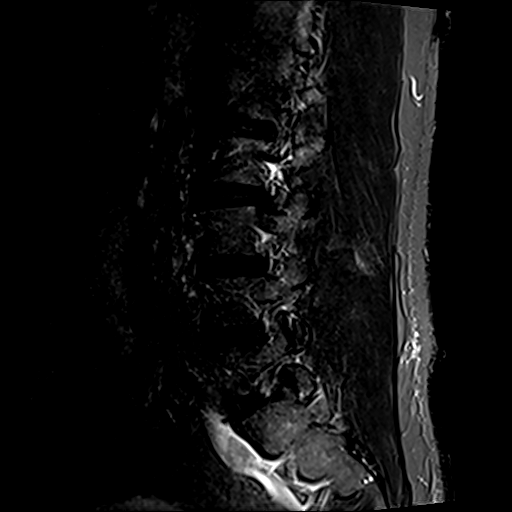

[Series 8: T2 · axial · 4.0mm · 0.70mm/px · z∈[-48,+125]mm · 8 of 32 slices shown (2 of 2)]
[im 1/32]
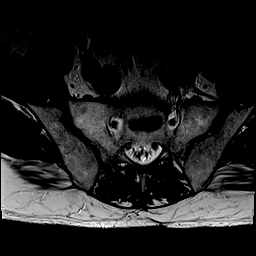
[im 5/32]
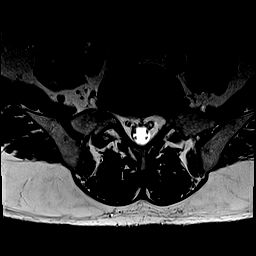
[im 10/32]
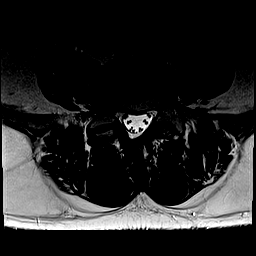
[im 15/32]
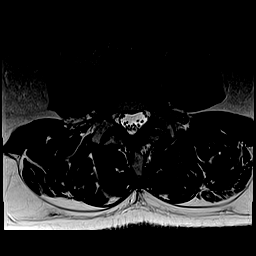
[im 17/32]
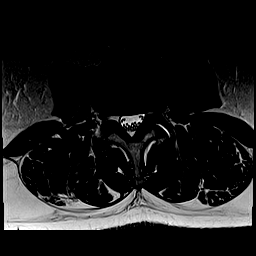
[im 22/32]
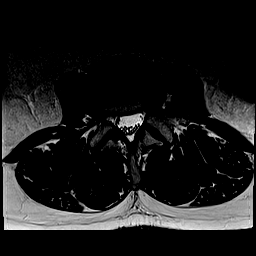
[im 27/32]
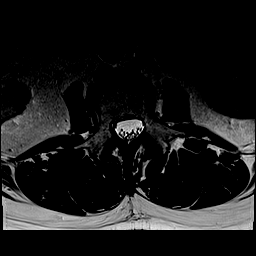
[im 32/32]
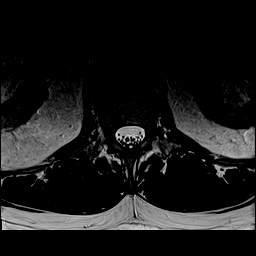

[Series 9: T1 · axial · 4.0mm · 0.35mm/px · z∈[-48,+125]mm · 8 of 32 slices shown (2 of 2)]
[im 1/32]
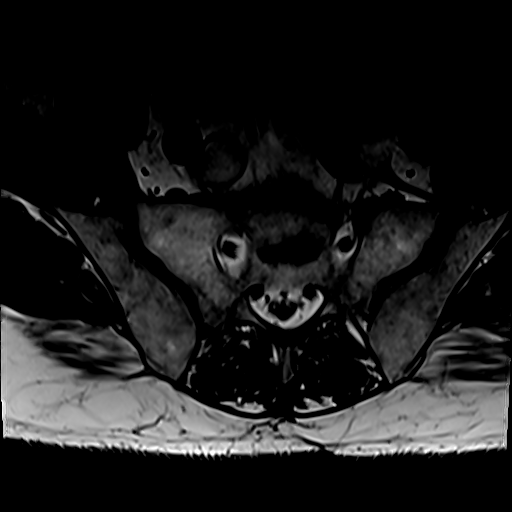
[im 5/32]
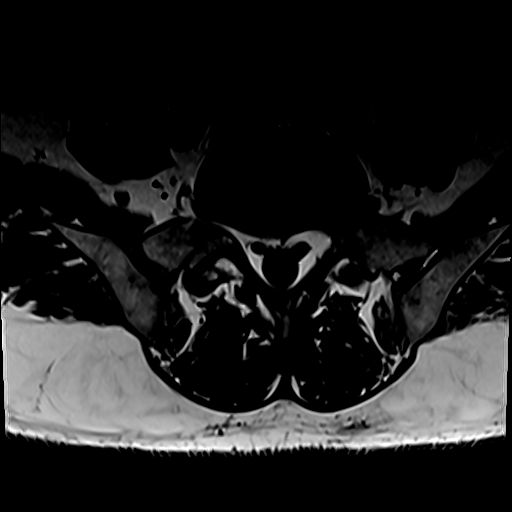
[im 10/32]
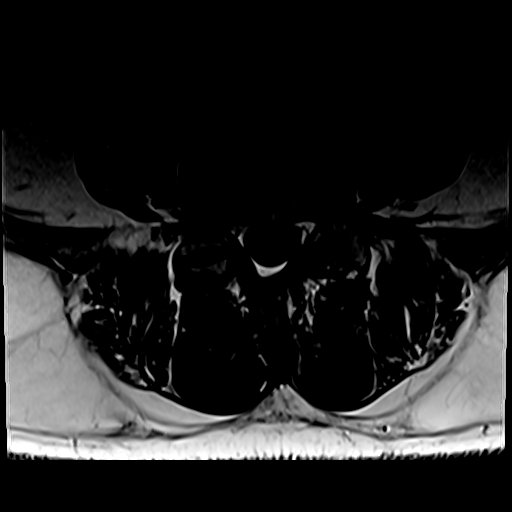
[im 15/32]
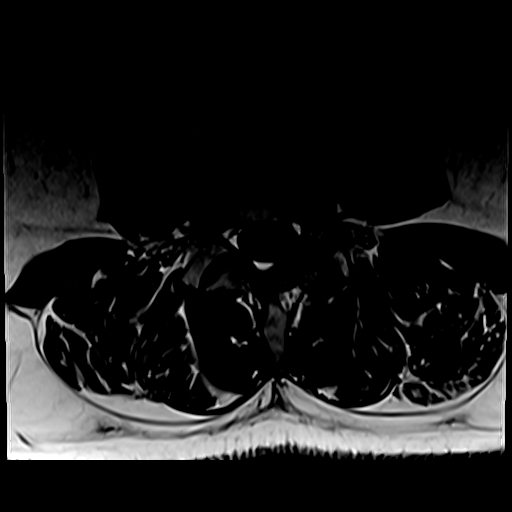
[im 17/32]
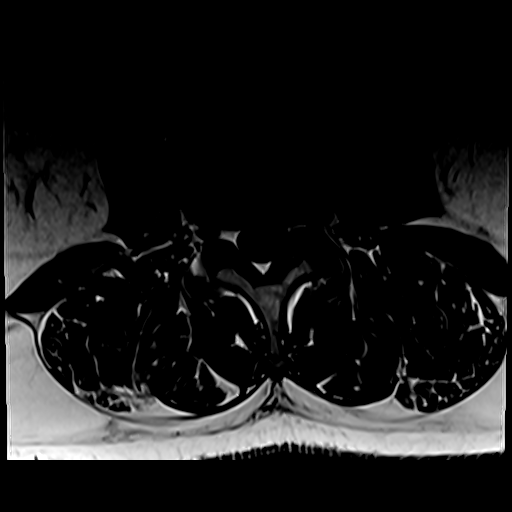
[im 22/32]
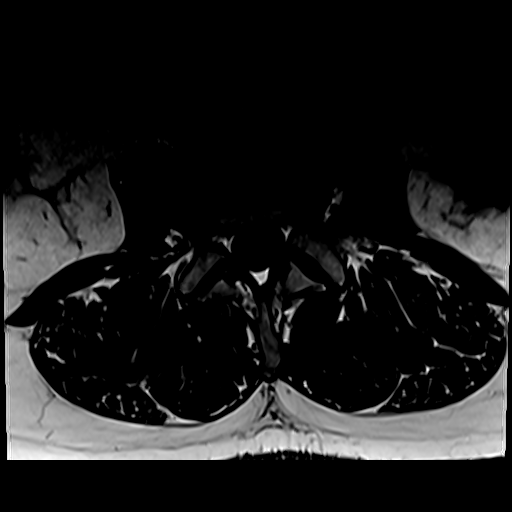
[im 27/32]
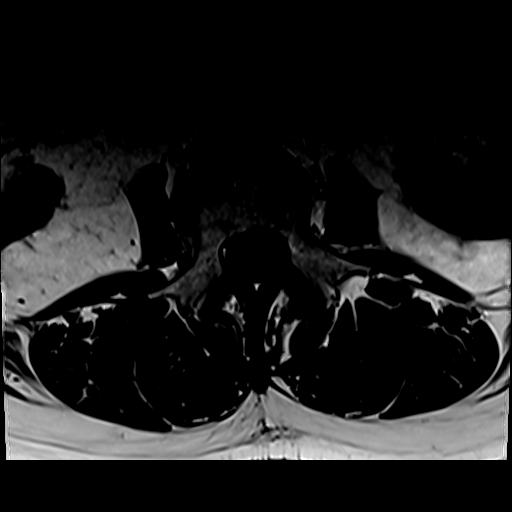
[im 32/32]
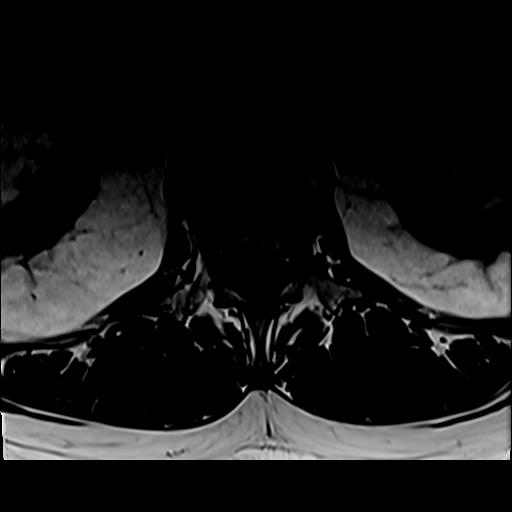

[31 of 48 positions shown; findings below may reference images not displayed]

FINDINGS: Segmentation:  Standard.

Alignment:  Mild retrolisthesis of L5.

Vertebrae:  No fracture, evidence of discitis, or bone lesion.

Conus medullaris and cauda equina: Conus extends to the L1 level.
Conus and cauda equina appear normal.

Paraspinal and other soft tissues: Negative.

Disc levels:

T12-L1: No significant disc bulge. No neural foraminal stenosis. No
central canal stenosis.

L1-L2: No significant disc bulge. No neural foraminal stenosis. No
central canal stenosis.

L2-L3: Mild disc bulge without significant spinal canal or neural
foraminal stenosis.

L3-L4: Broad-based disc protrusion and ligamentum flavum hypertrophy
with mild narrowing of bilateral lateral recesses. No significant
neural foraminal stenosis.

L4-L5: Broad-based disc protrusion and ligamentum flavum hypertrophy
with narrowing of bilateral lateral recesses. Mild bilateral neural
foraminal stenosis, right greater than the left.

L5-S1: No significant disc bulge. No neural foraminal stenosis. No
central canal stenosis. Mild facet joint arthropathy.
IMPRESSION: 1.  No evidence of acute fracture

2. Degenerate disc disease of the lumbar spine prominent at L3-L4
and L4-L5 with narrowing of bilateral recesses. Bilateral neural
foraminal stenosis at L4-L5, right greater than the left.

## 2022-07-18 NOTE — Progress Notes (Signed)
[name] - 66 y.o. male MRN [id number]  Date of birth: 10-30-1956  Office Visit Note: Visit Date: 07/11/2022 PCP: Natalia Leatherwood, DO Referred by: Natalia Leatherwood, DO  Subjective: Chief Complaint  Patient presents with   Lower Back - Pain   HPI:  [name] is a 66 y.o. male who comes in today at the request of Dr. Annell Greening for planned Bilateral L4-5 Lumbar Transforaminal epidural steroid injection with fluoroscopic guidance.  The patient has failed conservative care including home exercise, medications, time and activity modification.  This injection will be diagnostic and hopefully therapeutic.  Please see requesting physician notes for further details and justification.   ROS Otherwise per HPI.  Assessment & Plan: Visit Diagnoses:    ICD-10-CM   1. Lumbar radiculopathy  M54.16 XR C-ARM NO REPORT    Epidural Steroid injection    methylPREDNISolone acetate (DEPO-MEDROL) injection 80 mg      Plan: No additional findings.   Meds & Orders:  Meds ordered this encounter  Medications   methylPREDNISolone acetate (DEPO-MEDROL) injection 80 mg    Orders Placed This Encounter  Procedures   XR C-ARM NO REPORT   Epidural Steroid injection    Follow-up: Return in about 3 weeks (around 08/01/2022) for Annell Greening, MD.   Procedures: No procedures performed  Lumbosacral Transforaminal Epidural Steroid Injection - Sub-Pedicular Approach with Fluoroscopic Guidance  Patient: [name]      Date of Birth: [birth year] MRN: [id number] PCP: Natalia Leatherwood, DO      Visit Date: 07/11/2022   Universal Protocol:    Date/Time: 07/11/2022  Consent Given By: the patient  Position: PRONE  Additional Comments: Vital signs were monitored before and after the procedure. Patient was prepped and draped in the usual sterile fashion. The correct patient, procedure, and site was verified.   Injection Procedure Details:   Procedure diagnoses: Lumbar radiculopathy [M54.16]     Meds Administered:  Meds ordered this encounter  Medications   methylPREDNISolone acetate (DEPO-MEDROL) injection 80 mg    Laterality: Bilateral  Location/Site: L4  Needle:5.0 in., 22 ga.  Short bevel or Quincke spinal needle  Needle Placement: Transforaminal  Findings:    -Comments: Excellent flow of contrast along the nerve, nerve root and into the epidural space.  Procedure Details: After squaring off the end-plates to get a true AP view, the C-arm was positioned so that an oblique view of the foramen as noted above was visualized. The target area is just inferior to the "nose of the scotty dog" or sub pedicular. The soft tissues overlying this structure were infiltrated with 2-3 ml. of 1% Lidocaine without Epinephrine.  The spinal needle was inserted toward the target using a "trajectory" view along the fluoroscope beam.  Under AP and lateral visualization, the needle was advanced so it did not puncture dura and was located close the 6 O'Clock position of the pedical in AP tracterory. Biplanar projections were used to confirm position. Aspiration was confirmed to be negative for CSF and/or blood. A 1-2 ml. volume of Isovue-250 was injected and flow of contrast was noted at each level. Radiographs were obtained for documentation purposes.   After attaining the desired flow of contrast documented above, a 0.5 to 1.0 ml test dose of 0.25% Marcaine was injected into each respective transforaminal space.  The patient was observed for 90 seconds post injection.  After no sensory deficits were reported, and normal lower extremity motor function was noted,  the above injectate was administered so that equal amounts of the injectate were placed at each foramen (level) into the transforaminal epidural space.   Additional Comments:  The patient tolerated the procedure well Dressing: 2 x 2 sterile gauze and Band-Aid    Post-procedure details: Patient was observed during the  procedure. Post-procedure instructions were reviewed.  Patient left the clinic in stable condition.    Clinical History: MRI LUMBAR SPINE WITHOUT CONTRAST   TECHNIQUE: Multiplanar, multisequence MR imaging of the lumbar spine was performed. No intravenous contrast was administered.   COMPARISON:  MRI lumbar spine dated February 11, 2016.   FINDINGS: Segmentation:  Standard.   Alignment:  Mild retrolisthesis of L5.   Vertebrae:  No fracture, evidence of discitis, or bone lesion.   Conus medullaris and cauda equina: Conus extends to the L1 level. Conus and cauda equina appear normal.   Paraspinal and other soft tissues: Negative.   Disc levels:   T12-L1: No significant disc bulge. No neural foraminal stenosis. No central canal stenosis.   L1-L2: No significant disc bulge. No neural foraminal stenosis. No central canal stenosis.   L2-L3: Mild disc bulge without significant spinal canal or neural foraminal stenosis.   L3-L4: Broad-based disc protrusion and ligamentum flavum hypertrophy with mild narrowing of bilateral lateral recesses. No significant neural foraminal stenosis.   L4-L5: Broad-based disc protrusion and ligamentum flavum hypertrophy with narrowing of bilateral lateral recesses. Mild bilateral neural foraminal stenosis, right greater than the left.   L5-S1: No significant disc bulge. No neural foraminal stenosis. No central canal stenosis. Mild facet joint arthropathy.   IMPRESSION: 1.  No evidence of acute fracture   2. Degenerate disc disease of the lumbar spine prominent at L3-L4 and L4-L5 with narrowing of bilateral recesses. Bilateral neural foraminal stenosis at L4-L5, right greater than the left.     Electronically Signed   By: Larose Hires D.O.   On: 03/15/2022 17:27     Objective:  VS:  HT:    WT:   BMI:     BP:117/66  HR:(!) 57bpm  TEMP: ( )  RESP:  Physical Exam Vitals and nursing note reviewed.  Constitutional:      General:  He is not in acute distress.    Appearance: Normal appearance. He is not ill-appearing.  HENT:     Head: Normocephalic and atraumatic.     Right Ear: External ear normal.     Left Ear: External ear normal.     Nose: No congestion.  Eyes:     Extraocular Movements: Extraocular movements intact.  Cardiovascular:     Rate and Rhythm: Normal rate.     Pulses: Normal pulses.  Pulmonary:     Effort: Pulmonary effort is normal. No respiratory distress.  Abdominal:     General: There is no distension.     Palpations: Abdomen is soft.  Musculoskeletal:        General: No tenderness or signs of injury.     Cervical back: Neck supple.     Right lower leg: No edema.     Left lower leg: No edema.     Comments: Patient has good distal strength without clonus.  Skin:    Findings: No erythema or rash.  Neurological:     General: No focal deficit present.     Mental Status: He is alert and oriented to person, place, and time.     Sensory: No sensory deficit.     Motor: No weakness or abnormal  muscle tone.     Coordination: Coordination normal.  Psychiatric:        Mood and Affect: Mood normal.        Behavior: Behavior normal.      Imaging: No results found.

## 2022-07-18 NOTE — Procedures (Signed)
Lumbosacral Transforaminal Epidural Steroid Injection - Sub-Pedicular Approach with Fluoroscopic Guidance  Patient: [name]      Date of Birth: December 30, 1955 MRN: [id number] PCP: Natalia Leatherwood, DO      Visit Date: 07/11/2022   Universal Protocol:    Date/Time: 07/11/2022  Consent Given By: the patient  Position: PRONE  Additional Comments: Vital signs were monitored before and after the procedure. Patient was prepped and draped in the usual sterile fashion. The correct patient, procedure, and site was verified.   Injection Procedure Details:   Procedure diagnoses: Lumbar radiculopathy [M54.16]    Meds Administered:  Meds ordered this encounter  Medications   methylPREDNISolone acetate (DEPO-MEDROL) injection 80 mg    Laterality: Bilateral  Location/Site: L4  Needle:5.0 in., 22 ga.  Short bevel or Quincke spinal needle  Needle Placement: Transforaminal  Findings:    -Comments: Excellent flow of contrast along the nerve, nerve root and into the epidural space.  Procedure Details: After squaring off the end-plates to get a true AP view, the C-arm was positioned so that an oblique view of the foramen as noted above was visualized. The target area is just inferior to the "nose of the scotty dog" or sub pedicular. The soft tissues overlying this structure were infiltrated with 2-3 ml. of 1% Lidocaine without Epinephrine.  The spinal needle was inserted toward the target using a "trajectory" view along the fluoroscope beam.  Under AP and lateral visualization, the needle was advanced so it did not puncture dura and was located close the 6 O'Clock position of the pedical in AP tracterory. Biplanar projections were used to confirm position. Aspiration was confirmed to be negative for CSF and/or blood. A 1-2 ml. volume of Isovue-250 was injected and flow of contrast was noted at each level. Radiographs were obtained for documentation purposes.   After attaining the desired  flow of contrast documented above, a 0.5 to 1.0 ml test dose of 0.25% Marcaine was injected into each respective transforaminal space.  The patient was observed for 90 seconds post injection.  After no sensory deficits were reported, and normal lower extremity motor function was noted,   the above injectate was administered so that equal amounts of the injectate were placed at each foramen (level) into the transforaminal epidural space.   Additional Comments:  The patient tolerated the procedure well Dressing: 2 x 2 sterile gauze and Band-Aid    Post-procedure details: Patient was observed during the procedure. Post-procedure instructions were reviewed.  Patient left the clinic in stable condition.

## 2022-07-22 ENCOUNTER — Ambulatory Visit: Payer: Medicare Other | Admitting: Family Medicine

## 2022-07-22 ENCOUNTER — Telehealth: Payer: Self-pay | Admitting: Family Medicine

## 2022-07-22 NOTE — Telephone Encounter (Signed)
Eye exam printed.

## 2022-07-22 NOTE — Telephone Encounter (Signed)
Pt informed chart will be updated next week

## 2022-07-22 NOTE — Telephone Encounter (Signed)
Pt called and said he had a diabetic eye exam last Wednesday (8/16) at [facility] and they informed him that he could call us for the results and that this office would update them on Mychart.

## 2022-08-02 ENCOUNTER — Ambulatory Visit: Payer: Medicare Other | Admitting: Orthopaedic Surgery

## 2022-08-09 ENCOUNTER — Ambulatory Visit (INDEPENDENT_AMBULATORY_CARE_PROVIDER_SITE_OTHER): Payer: Medicare Other | Admitting: Family Medicine

## 2022-08-09 ENCOUNTER — Encounter: Payer: Self-pay | Admitting: Family Medicine

## 2022-08-09 ENCOUNTER — Telehealth: Payer: Self-pay

## 2022-08-09 VITALS — BP 90/46 | HR 54 | Temp 98.1°F | Ht 70.0 in | Wt 207.0 lb

## 2022-08-09 DIAGNOSIS — E785 Hyperlipidemia, unspecified: Secondary | ICD-10-CM | POA: Diagnosis not present

## 2022-08-09 DIAGNOSIS — F5101 Primary insomnia: Secondary | ICD-10-CM | POA: Diagnosis not present

## 2022-08-09 DIAGNOSIS — E559 Vitamin D deficiency, unspecified: Secondary | ICD-10-CM

## 2022-08-09 DIAGNOSIS — E113293 Type 2 diabetes mellitus with mild nonproliferative diabetic retinopathy without macular edema, bilateral: Secondary | ICD-10-CM | POA: Diagnosis not present

## 2022-08-09 DIAGNOSIS — Z1211 Encounter for screening for malignant neoplasm of colon: Secondary | ICD-10-CM | POA: Diagnosis not present

## 2022-08-09 DIAGNOSIS — I10 Essential (primary) hypertension: Secondary | ICD-10-CM

## 2022-08-09 DIAGNOSIS — Z955 Presence of coronary angioplasty implant and graft: Secondary | ICD-10-CM | POA: Diagnosis not present

## 2022-08-09 DIAGNOSIS — I2510 Atherosclerotic heart disease of native coronary artery without angina pectoris: Secondary | ICD-10-CM

## 2022-08-09 DIAGNOSIS — F411 Generalized anxiety disorder: Secondary | ICD-10-CM

## 2022-08-09 DIAGNOSIS — Z23 Encounter for immunization: Secondary | ICD-10-CM

## 2022-08-09 DIAGNOSIS — M47817 Spondylosis without myelopathy or radiculopathy, lumbosacral region: Secondary | ICD-10-CM

## 2022-08-09 DIAGNOSIS — I77810 Thoracic aortic ectasia: Secondary | ICD-10-CM | POA: Diagnosis not present

## 2022-08-09 DIAGNOSIS — E1169 Type 2 diabetes mellitus with other specified complication: Secondary | ICD-10-CM

## 2022-08-09 DIAGNOSIS — I252 Old myocardial infarction: Secondary | ICD-10-CM

## 2022-08-09 DIAGNOSIS — M48061 Spinal stenosis, lumbar region without neurogenic claudication: Secondary | ICD-10-CM

## 2022-08-09 DIAGNOSIS — E1142 Type 2 diabetes mellitus with diabetic polyneuropathy: Secondary | ICD-10-CM | POA: Diagnosis not present

## 2022-08-09 LAB — POCT GLYCOSYLATED HEMOGLOBIN (HGB A1C)
HbA1c POC (<> result, manual entry): 7.6 % (ref 4.0–5.6)
HbA1c, POC (controlled diabetic range): 7.6 % — AB (ref 0.0–7.0)
HbA1c, POC (prediabetic range): 7.6 % — AB (ref 5.7–6.4)
Hemoglobin A1C: 7.6 % — AB (ref 4.0–5.6)

## 2022-08-09 MED ORDER — DIAZEPAM 5 MG PO TABS: 5.0000 mg | ORAL_TABLET | Freq: Four times a day (QID) | ORAL | 5 refills | Status: DC | PRN

## 2022-08-09 MED ORDER — NAPROXEN 500 MG PO TABS
ORAL_TABLET | ORAL | 1 refills | Status: DC
Start: 2022-08-09 — End: 2022-12-08

## 2022-08-09 MED ORDER — ROSUVASTATIN CALCIUM 40 MG PO TABS: 40.0000 mg | ORAL_TABLET | Freq: Every day | ORAL | 3 refills | Status: DC

## 2022-08-09 MED ORDER — LANTUS SOLOSTAR 100 UNIT/ML ~~LOC~~ SOPN: PEN_INJECTOR | SUBCUTANEOUS | 2 refills | Status: DC

## 2022-08-09 MED ORDER — LISINOPRIL 20 MG PO TABS: 20.0000 mg | ORAL_TABLET | Freq: Every day | ORAL | 1 refills | Status: DC

## 2022-08-09 MED ORDER — JANUMET XR 100-1000 MG PO TB24
1.0000 | ORAL_TABLET | Freq: Every day | ORAL | 1 refills | Status: DC
Start: 2022-08-09 — End: 2022-12-08

## 2022-08-09 MED ORDER — VITAMIN D (ERGOCALCIFEROL) 1.25 MG (50000 UNIT) PO CAPS: 50000.0000 [IU] | ORAL_CAPSULE | ORAL | 0 refills | Status: DC

## 2022-08-09 MED ORDER — OMEGA-3-ACID ETHYL ESTERS 1 G PO CAPS: 1.0000 | ORAL_CAPSULE | Freq: Every day | ORAL | 2 refills | Status: DC

## 2022-08-09 MED ORDER — TRAZODONE HCL 100 MG PO TABS: 100.0000 mg | ORAL_TABLET | Freq: Every evening | ORAL | 1 refills | Status: DC | PRN

## 2022-08-09 MED ORDER — METOPROLOL SUCCINATE ER 25 MG PO TB24: 25.0000 mg | ORAL_TABLET | Freq: Every day | ORAL | 1 refills | Status: DC

## 2022-08-09 MED ORDER — DAPAGLIFLOZIN PROPANEDIOL 10 MG PO TABS
ORAL_TABLET | ORAL | 1 refills | Status: DC
Start: 2022-08-09 — End: 2023-03-16

## 2022-08-09 NOTE — Progress Notes (Signed)
[name] , [birth year], 66 y.o., male MRN: [id number] Patient Care Team    Relationship Specialty Notifications Start End  Ma Hillock, DO PCP - General Family Medicine  11/29/18   Lelon Perla, MD PCP - Cardiology Cardiology  08/07/20   Bernarda Caffey, MD Consulting Physician Ophthalmology  01/09/19     Chief Complaint  Patient presents with   Diabetes    [facility]; pt is fasting     Subjective: Pt presents for an OV for  Diabetes with complications/overweight:  Patient reports he has been a diabetic since 1998.   He reports with compliance w/  Janumet/Metformin 1000-100 mg daily, Farxiga 10 mg daily and Lantus 50 units daily. Patient denies dizziness, hyperglycemic or hypoglycemic events. Patient denies numbness, tingling in the extremities or nonhealing wounds of feet.   Depression/anxiety/insomnia: Patient reports he is feeling well  with Valium 4 times daily and trazodone 100 mg nightly.   He had been on BuSpar as well which he originally thought was helpful, but then he felt that it was not as helpful.  [place] controlled substance database reviewed today. Original note: He states he has a rather routine day.  He does not have much social contact in a day.  He lives by himself.  He reports that he always feels like he "stays on edge.  "He reports when he wakes up he is already feeling on edge and he will take a Valium at that time.  He then has breakfast and goes on a 4 mile walk daily.  For lunch he starts to feel his anxiety sudden and he becomes panicked.  He states when he feels this way he will break out in a sweat, become very fearful and scared.  He states he commonly fears there is someone in the house with a gun.  He goes to his bedroom gets in his bed. He states the fear is so intense and he just cannot control it.  He denies any known trauma or event experienced in his life that would create such a fear.  He states he did go see a counselor/psychiatrist  at [facility] in the past 6 months, and he did not feel that was helpful for him.  Denies any SI or HI.   Hypertension/CAD/S/p Stent/prior MI:  Pt has a significant cardiac history of "anterior MI in 2004 with stents was LAD and then perhaps stents to branch vessel 2008 (by DR. Renaldo-cardio note 2015)" Patient reports compliance  with lisinopril-HCTZ 20-25 mg daily and metoprolol 25 mg daily, Lovaza, and Crestor.   He is not compliant with baby aspirin 81 mg daily.  Plavix was DC'd by cards.  His statin was increased by cardiology to Crestor 40 mg daily.  Patient denies chest pain, shortness of breath, dizziness or lower extremity edema.   Diet: Low-sodium Exercise: Routine exercise daily RF: Hypertension, hyperlipidemia, diabetes, overweight, family history, CAD, MI   Neck pain/back pain: pt reports about the same. Still using naproxen as needed.  Prior note: Patient has had recurrent posterior neck pain and headache.  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  He states he found an old prescription of naproxen and restarted it the last time he had symptoms.  He states it worked well for him.  He is wondering if we could get refills for him today.  In the past he has stated he could  not tolerate NSAIDs.     Erectile dysfunction: (Documentation only) patient would like to start a medication for erectile dysfunction.  He states he seen a urologist many years ago and does not desire to see a urologist again.  Patient has a significant medical history of blood pressure with systolic less than 035 and history of CAD and MI.   Last echo 03/06/2020:  1. Technically difficult study. Left ventricular ejection fraction, by  estimation, is 45 to 50%. The left ventricle has grossly mildly decreased  function. Image quality is not sufficient to assess for regional wall  motion abnormalities. Left ventricular   diastolic parameters are indeterminate.   2. Right  ventricle is poorly visualized. Grossly normal size and systolic  function. Tricuspid regurgitation signal is inadequate for assessing PA  pressure.   3. The mitral valve is normal in structure. No evidence of mitral valve  regurgitation.   4. Aortic dilatation noted. There is mild dilatation of the ascending  aorta measuring 37 mm.   5. The aortic valve is abnormal. Moderately calcified. Aortic valve  regurgitation is not visualized. Mild aortic valve stenosis. Vmax 2.2 m/s,  MG 10 mmHg, AVA 1.7 cm^2, DI 0.4      11/10/2021    2:10 PM 08/18/2021    8:38 AM 09/23/2020    2:57 PM 02/13/2020    8:34 AM 06/04/2019    3:26 PM  Depression screen PHQ 2/9  Decreased Interest 0 1 0 0 3  Down, Depressed, Hopeless 0 1 0 1 3  PHQ - 2 Score 0 2 0 1 6  Altered sleeping  0   3  Tired, decreased energy  0   3  Change in appetite  1   3  Feeling bad or failure about yourself   1   1  Trouble concentrating  0   0  Moving slowly or fidgety/restless  0   0  Suicidal thoughts  0   0  PHQ-9 Score  4   16  Difficult doing work/chores  Not difficult at all   Very difficult    Allergies  Allergen Reactions   Gabapentin Other (See Comments)    Paranoia/hallucinations   Hydrocodone Other (See Comments)    PT reports  Confusion when tacking Hydrocodone   Nsaids     History of heart disease, CAD with history of MI, CABG status post stent   Prednisone     Paranoia   Tramadol    Penicillins Other (See Comments)    Childhood unsure if still allgeric   Social History   Social History Narrative   Marital status/children/pets: Single   Education/employment: HS grad, 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:      -smoke alarm in the home:Yes     - wears seatbelt: Yes      Past Medical History:  Diagnosis Date   Alcohol addiction ([place])    Anxiety    Arthritis    Bulging of lumbar intervertebral disc 2017   MRI: L3-4, L4-5 and L5-S1- facet hypertrophy and formainal stenosis   CAD (coronary artery  disease)    Coronary artery disease with hx of myocardial infarct w/o hx of CABG 2004   Depression    Dextrocardia    Diabetes ([place])    Heart attack ([place]) 2004   Stent placement 2008   Hyperlipidemia    Hypertension    Insomnia    Past Surgical History:  Procedure Laterality Date   CORONARY ANGIOPLASTY WITH STENT PLACEMENT  2008   Family History  Problem Relation Age of Onset   Heart attack Mother    Heart disease Mother    Diabetes Mother    Arthritis Mother    Hyperlipidemia Mother    Hypertension Mother    Breast cancer Mother    Heart attack Father    Heart disease Father    Alcohol abuse Father    Early death Father    Arthritis Sister    Heart disease Sister    Hyperlipidemia Sister    Asthma Sister    COPD Sister    Diabetes Sister    Alcohol abuse Sister    Allergies as of 08/09/2022       Reactions   Gabapentin Other (See Comments)   Paranoia/hallucinations   Hydrocodone Other (See Comments)   PT reports  Confusion when tacking Hydrocodone   Nsaids    History of heart disease, CAD with history of MI, CABG status post stent   Prednisone    Paranoia   Tramadol    Penicillins Other (See Comments)   Childhood unsure if still allgeric        Medication List        Accurate as of August 09, 2022  9:25 AM. If you have any questions, ask your nurse or doctor.          STOP taking these medications    blood glucose meter kit and supplies Kit Stopped by: Howard Pouch, DO       TAKE these medications    Accu-Chek FastClix Lancets Misc   aspirin EC 81 MG tablet Take 1 tablet (81 mg total) by mouth daily.   cholecalciferol 25 MCG (1000 UNIT) tablet Commonly known as: VITAMIN D3 Take 2,000 Units by mouth daily.   dapagliflozin propanediol 10 MG Tabs tablet Commonly known as: Farxiga TAKE 1 TABLET BY MOUTH  DAILY BEFORE BREAKFAST   diazepam 5 MG tablet Commonly known as: VALIUM Take 1 tablet (5 mg total) by mouth every 6 (six) hours  as needed for anxiety.   GNP UltiCare Pen Needles 31G X 5 MM Misc Generic drug: Insulin Pen Needle AS DIRECTED ONCE DAILY   Janumet XR [phone] MG Tb24 Generic drug: SitaGLIPtin-MetFORMIN HCl Take 1 tablet by mouth daily.   Lantus SoloStar 100 UNIT/ML Solostar Pen Generic drug: insulin glargine Inject 40 units in the morning and 15 units in the evening. What changed: additional instructions Changed by: Howard Pouch, DO   lisinopril 20 MG tablet Commonly known as: ZESTRIL Take 1 tablet (20 mg total) by mouth daily.   metoprolol succinate 25 MG 24 hr tablet Commonly known as: TOPROL-XL Take 1 tablet (25 mg total) by mouth daily.   naproxen 500 MG tablet Commonly known as: NAPROSYN Every 12 hours with food as needed only   nitroGLYCERIN 0.4 MG SL tablet Commonly known as: NITROSTAT DISSOLVE 1 TABLET UNDER THE TONGUE EVERY 5 MINUTES AS  NEEDED FOR CHEST PAIN. MAX  OF 3 TABLETS IN 15 MINUTES. CALL 911 IF PAIN PERSISTS.   omega-3 acid ethyl esters 1 g capsule Commonly known as: LOVAZA Take 1 capsule (1 g total) by mouth daily.   OneTouch Ultra test strip Generic drug: glucose blood Use as instructed   rosuvastatin 40 MG tablet Commonly known as: CRESTOR Take 1 tablet (40 mg total) by mouth at bedtime.   traZODone 100 MG tablet Commonly known as: DESYREL Take 1 tablet (100 mg total) by mouth at bedtime as needed for sleep.  Vitamin D (Ergocalciferol) 1.25 MG (50000 UNIT) Caps capsule Commonly known as: DRISDOL Take 1 capsule (50,000 Units total) by mouth every 7 (seven) days.        All past medical history, surgical history, allergies, family history, immunizations andmedications were updated in the EMR today and reviewed under the history and medication portions of their EMR.     ROS: Negative, with the exception of above mentioned in HPI   Objective:  BP (!) 90/46   Pulse (!) 54   Temp 98.1 F (36.7 C) (Oral)   Ht '5\' 10"'  (1.778 m)   Wt 207 lb (93.9 kg)    SpO2 98%   BMI 29.70 kg/m  Body mass index is 29.7 kg/m. Physical Exam Vitals and nursing note reviewed.  Constitutional:      General: He is not in acute distress.    Appearance: Normal appearance. He is obese. He is not ill-appearing, toxic-appearing or diaphoretic.  HENT:     Head: Normocephalic and atraumatic.  Eyes:     General: No scleral icterus.       Right eye: No discharge.        Left eye: No discharge.     Extraocular Movements: Extraocular movements intact.     Pupils: Pupils are equal, round, and reactive to light.  Cardiovascular:     Rate and Rhythm: Regular rhythm. Bradycardia present.     Pulses: Normal pulses.     Heart sounds: Murmur heard.     No friction rub. No gallop.  Pulmonary:     Effort: Pulmonary effort is normal. No respiratory distress.     Breath sounds: Normal breath sounds. No wheezing, rhonchi or rales.  Musculoskeletal:        General: Normal range of motion.     Cervical back: Neck supple.     Right lower leg: No edema.     Left lower leg: No edema.  Lymphadenopathy:     Cervical: No cervical adenopathy.  Skin:    General: Skin is warm and dry.     Coloration: Skin is not jaundiced or pale.     Findings: No rash.  Neurological:     Mental Status: He is alert and oriented to person, place, and time. Mental status is at baseline.  Psychiatric:        Mood and Affect: Mood normal.        Behavior: Behavior normal.        Thought Content: Thought content normal.        Judgment: Judgment normal.    Diabetic Foot Exam - Simple   Simple Foot Form Diabetic Foot exam was performed with the following findings: Yes 08/09/2022  8:57 AM  Visual Inspection No deformities, no ulcerations, no other skin breakdown bilaterally: Yes Sensation Testing Intact to touch and monofilament testing bilaterally: Yes Pulse Check Posterior Tibialis and Dorsalis pulse intact bilaterally: Yes Comments     No results found. No results found. Results  for orders placed or performed in visit on 08/09/22 (from the past 24 hour(s))  POCT HgB A1C     Status: Abnormal   Collection Time: 08/09/22  8:56 AM  Result Value Ref Range   Hemoglobin A1C 7.6 (A) 4.0 - 5.6 %   HbA1c POC (<> result, manual entry) 7.6 4.0 - 5.6 %   HbA1c, POC (prediabetic range) 7.6 (A) 5.7 - 6.4 %   HbA1c, POC (controlled diabetic range) 7.6 (A) 0.0 - 7.0 %  Assessment/Plan: [name] is a 66 y.o. male present for OV for  Dyslipidemia associated with type 2 diabetes mellitus (HCC)/diabetic peripheral neuropathy/mild nonproliferative diabetic retinopathy of both eyes/overweight:  He has not been following a diabetic diet> but "doing better" Suspect diet, lack of exercise and alcohol consumption to be playing a factor in his inability to decrease his A1c despite increasing amounts of medications. -Continue janumet [phone] daily -Continue farixga 10 mg daily -increase Lantus 55 units (40/15)   PNA series: Completed after 63 Flu shot: Encouraged yearly Foot exam: 08/09/2022 Eye exam:  Completed 09/23/2020-[facility]-referred back to them today. A1c:10.4>>>9.4>>  9.7>>11.8>> 8.5 >>> 6.5 >>>7.1>7.6>7.7>8.7> 8.8 on 04/20/2021>8.3 > 8.7 > 7.4 > 7.6 today  Anxiety/depression/insomnia: Stable Has a history of significant depression with anxiety and paranoid features. Continue Valium Continue trazodone to 100 mg daily Meds tried in the past: BuSpar patient reports it was not effective.  Paxil patient reports made him more anxious. -  [place] controlled substance database reviewed today  Patient aware he needs a face-to-face appointment in order for any refills to be processed -Contract signed -Offered referral to counseling or psychiatry; he declined. Follow-up required every 5.5 months    Hypertension/CAD/S/p Stent/prior MI/hypercholesteremia/aortic stenosis/aortic dilatation/LVH:  Blood pressures are actually low today.  He is asymptomatic. Pt has a  significant cardiac history of "anterior MI in 2004 with stents was LAD and then stents to a branch vessel 2008 (by DR. Renaldo- cardio note 2015)" Decrease lisinopril to 20 mg.  (Just removed HCTZ portion) if needed next appointment would also start decreasing the lisinopril as well. -Continue metoprolol 25 mg daily> may be able to go to half dose only bradycardic today - continue aspirin 81 mg daily.  Cardiology discontinued Plavix  -continue Crestor 40 mg daily Diet: Low-sodium Exercise: Routine exercise daily - now established with cardiology as well for routine follow up.   Memory changes/vitamin D deficiency: He has been taking vitamin D supplement OTC, he is not certain the exact amount he is taking. He has not noticed any changes in his memory. He is on a benzodiazepine and trazodone which could be contributing to his memory decline, especially when he is consuming alcohol.  This was discussed with him today. He was strongly encouraged not to consume alcohol.  If he is expecting could to consume alcohol he is to avoid taking the Valium.   Influenza vaccine administered today Colon cancer screening: Went ahead and ordered his Cologuard testing.  Still encouraged him to follow-up for his Medicare wellness.  Reviewed expectations re: course of current medical issues. Discussed self-management of symptoms. Outlined signs and symptoms indicating need for more acute intervention. Patient verbalized understanding and all questions were answered. Patient received an After-Visit Summary.    Orders Placed This Encounter  Procedures   Flu Vaccine QUAD High Dose(Fluad)   Cologuard   POCT HgB A1C    Meds ordered this encounter  Medications   insulin glargine (LANTUS SOLOSTAR) 100 UNIT/ML Solostar Pen    Sig: Inject 40 units in the morning and 15 units in the evening.    Dispense:  60 mL    Refill:  2    USE THIS SCRIPT PLEASE!!! DC ALL THE other scripts please.   dapagliflozin  propanediol (FARXIGA) 10 MG TABS tablet    Sig: TAKE 1 TABLET BY MOUTH  DAILY BEFORE BREAKFAST    Dispense:  90 tablet    Refill:  1    Requesting 1 year supply> no  diazepam (VALIUM) 5 MG tablet    Sig: Take 1 tablet (5 mg total) by mouth every 6 (six) hours as needed for anxiety.    Dispense:  120 tablet    Refill:  5   JANUMET XR [phone] MG TB24    Sig: Take 1 tablet by mouth daily.    Dispense:  90 tablet    Refill:  1   lisinopril (ZESTRIL) 20 MG tablet    Sig: Take 1 tablet (20 mg total) by mouth daily.    Dispense:  90 tablet    Refill:  1    DC combo lisinopril-hctz please   naproxen (NAPROSYN) 500 MG tablet    Sig: Every 12 hours with food as needed only    Dispense:  180 tablet    Refill:  1   rosuvastatin (CRESTOR) 40 MG tablet    Sig: Take 1 tablet (40 mg total) by mouth at bedtime.    Dispense:  90 tablet    Refill:  3   traZODone (DESYREL) 100 MG tablet    Sig: Take 1 tablet (100 mg total) by mouth at bedtime as needed for sleep.    Dispense:  90 tablet    Refill:  1   Vitamin D, Ergocalciferol, (DRISDOL) 1.25 MG (50000 UNIT) CAPS capsule    Sig: Take 1 capsule (50,000 Units total) by mouth every 7 (seven) days.    Dispense:  12 capsule    Refill:  0   omega-3 acid ethyl esters (LOVAZA) 1 g capsule    Sig: Take 1 capsule (1 g total) by mouth daily.    Dispense:  90 capsule    Refill:  2    Requesting 1 year supply   metoprolol succinate (TOPROL-XL) 25 MG 24 hr tablet    Sig: Take 1 tablet (25 mg total) by mouth daily.    Dispense:  90 tablet    Refill:  1     Referral Orders  No referral(s) requested today       Note is dictated utilizing voice recognition software. Although note has been proof read prior to signing, occasional typographical errors still can be missed. If any questions arise, please do not hesitate to call for verification.   electronically signed by:  Howard Pouch, DO  [facility]

## 2022-08-09 NOTE — Telephone Encounter (Signed)
[facility] pharmacy to d/c lisinopril-htcz. Pt last pick up was 43 days ago for that medication

## 2022-08-09 NOTE — Patient Instructions (Addendum)
Split insulin dose to 40 units in the morning and 15 units in the evening.   A1c is 7.6 today  Next visit we will be completing all your labs.   Return in about 16 weeks (around 11/29/2022).        Great to see you today.  I have refilled the medication(s) we provide.   If labs were collected, we will inform you of lab results once received either by echart message or telephone call.   - echart message- for normal results that have been seen by the patient already.   - telephone call: abnormal results or if patient has not viewed results in their echart.

## 2022-08-18 ENCOUNTER — Telehealth: Payer: Self-pay | Admitting: Family Medicine

## 2022-08-18 DIAGNOSIS — Z1211 Encounter for screening for malignant neoplasm of colon: Secondary | ICD-10-CM

## 2022-08-18 NOTE — Telephone Encounter (Signed)
Cologuard order changed 

## 2022-08-18 NOTE — Progress Notes (Cosign Needed Addendum)
Subjective:   [name] is a 66 y.o. male who presents for Medicare Annual/Subsequent preventive examination.  I connected with  Glenford C Devito on 08/19/22 by an audio only telemedicine application and verified that I am speaking with the correct person using two identifiers.   I discussed the limitations, risks, security and privacy concerns of performing an evaluation and management service by telephone and the availability of in person appointments. I also discussed with the patient that there may be a patient responsible charge related to this service. The patient expressed understanding and verbally consented to this telephonic visit.  Location of Patient: home Location of Provider: office  List any persons and their role that are participating in the visit with the patient.   Jane Milby Rocky Crafts, CMA  Review of Systems    Defer to PCP Cardiac Risk Factors include: advanced age (>14men, >70 women);diabetes mellitus;obesity (BMI >30kg/m2);male gender     Objective:    Today's Vitals   08/18/22 1544  PainSc: 5    There is no height or weight on file to calculate BMI.     08/18/2022    4:42 PM 08/18/2021    8:35 AM 12/21/2015    1:11 PM  Advanced Directives  Does Patient Have a Medical Advance Directive? No No No  Would patient like information on creating a medical advance directive? No - Patient declined No - Patient declined     Current Medications (verified) Outpatient Encounter Medications as of 08/19/2022  Medication Sig   Accu-Chek FastClix Lancets MISC    aspirin EC 81 MG tablet Take 1 tablet (81 mg total) by mouth daily. (Patient not taking: Reported on 08/09/2022)   cholecalciferol (VITAMIN D) 25 MCG (1000 UNIT) tablet Take 2,000 Units by mouth daily.   dapagliflozin propanediol (FARXIGA) 10 MG TABS tablet TAKE 1 TABLET BY MOUTH  DAILY BEFORE BREAKFAST   diazepam (VALIUM) 5 MG tablet Take 1 tablet (5 mg total) by mouth every 6 (six) hours as needed  for anxiety.   glucose blood (ONETOUCH ULTRA) test strip Use as instructed   GNP ULTICARE PEN NEEDLES 31G X 5 MM MISC AS DIRECTED ONCE DAILY   insulin glargine (LANTUS SOLOSTAR) 100 UNIT/ML Solostar Pen Inject 40 units in the morning and 15 units in the evening.   JANUMET XR [phone] MG TB24 Take 1 tablet by mouth daily.   lisinopril (ZESTRIL) 20 MG tablet Take 1 tablet (20 mg total) by mouth daily.   metoprolol succinate (TOPROL-XL) 25 MG 24 hr tablet Take 1 tablet (25 mg total) by mouth daily.   naproxen (NAPROSYN) 500 MG tablet Every 12 hours with food as needed only   nitroGLYCERIN (NITROSTAT) 0.4 MG SL tablet DISSOLVE 1 TABLET UNDER THE TONGUE EVERY 5 MINUTES AS  NEEDED FOR CHEST PAIN. MAX  OF 3 TABLETS IN 15 MINUTES. CALL 911 IF PAIN PERSISTS.   omega-3 acid ethyl esters (LOVAZA) 1 g capsule Take 1 capsule (1 g total) by mouth daily.   rosuvastatin (CRESTOR) 40 MG tablet Take 1 tablet (40 mg total) by mouth at bedtime.   traZODone (DESYREL) 100 MG tablet Take 1 tablet (100 mg total) by mouth at bedtime as needed for sleep.   Vitamin D, Ergocalciferol, (DRISDOL) 1.25 MG (50000 UNIT) CAPS capsule Take 1 capsule (50,000 Units total) by mouth every 7 (seven) days.   No facility-administered encounter medications on file as of 08/19/2022.    Allergies (verified) Gabapentin, Hydrocodone, Nsaids, Prednisone, Tramadol, and Penicillins  History: Past Medical History:  Diagnosis Date   Alcohol addiction (HCC)    Anxiety    Arthritis    Bulging of lumbar intervertebral disc 2017   MRI: L3-4, L4-5 and L5-S1- facet hypertrophy and formainal stenosis   CAD (coronary artery disease)    Coronary artery disease with hx of myocardial infarct w/o hx of CABG 2004   Depression    Dextrocardia    Diabetes (HCC)    Heart attack (HCC) 2004   Stent placement 2008   Hyperlipidemia    Hypertension    Insomnia    Past Surgical History:  Procedure Laterality Date   CORONARY ANGIOPLASTY WITH STENT  PLACEMENT  2008   Family History  Problem Relation Age of Onset   Heart attack Mother    Heart disease Mother    Diabetes Mother    Arthritis Mother    Hyperlipidemia Mother    Hypertension Mother    Breast cancer Mother    Heart attack Father    Heart disease Father    Alcohol abuse Father    Early death Father    Arthritis Sister    Heart disease Sister    Hyperlipidemia Sister    Asthma Sister    COPD Sister    Diabetes Sister    Alcohol abuse Sister    Social History   Socioeconomic History   Marital status: Single    Spouse name: Not on file   Number of children: 0   Years of education: HS   Highest education level: Not on file  Occupational History   Occupation: Full time    Employer: UNEMPLOYED  Tobacco Use   Smoking status: Never   Smokeless tobacco: Never  Vaping Use   Vaping Use: Never used  Substance and Sexual Activity   Alcohol use: Yes    Alcohol/week: 0.0 standard drinks of alcohol    Comment: Occasional   Drug use: Not Currently    Types: Cocaine, Marijuana    Comment: Former   Sexual activity: Not Currently    Partners: Female  Other Topics Concern   Not on file  Social History Narrative   Marital status/children/pets: Single   Education/employment: HS grad, 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installator   Safety:      -smoke alarm in the home:Yes     - wears seatbelt: Yes      Social Determinants of Health   Financial Resource Strain: Medium Risk (08/18/2022)   Overall Financial Resource Strain (CARDIA)    Difficulty of Paying Living Expenses: Somewhat hard  Food Insecurity: Food Insecurity Present (08/18/2022)   Hunger Vital Sign    Worried About Running Out of Food in the Last Year: Sometimes true    Ran Out of Food in the Last Year: Sometimes true  Transportation Needs: No Transportation Needs (08/18/2022)   PRAPARE - Administrator, Civil ServiceTransportation    Lack of Transportation (Medical): No    Lack of Transportation (Non-Medical): No  Physical Activity: Sufficiently  Active (08/19/2022)   Exercise Vital Sign    Days of Exercise per Week: 3 days    Minutes of Exercise per Session: 60 min  Stress: No Stress Concern Present (08/18/2022)   [employer] - Occupational Stress Questionnaire    Feeling of Stress : Only a little  Social Connections: Unknown (08/18/2022)   Social Connection and Isolation Panel [NHANES]    Frequency of Communication with Friends and Family: Patient refused    Frequency of Social Gatherings with Friends and Family: Once  a week    Attends Religious Services: Never    Active Member of Clubs or Organizations: No    Attends Engineer, structural: Never    Marital Status: Divorced    Tobacco Counseling Counseling given: Not Answered   Clinical Intake:  Pre-visit preparation completed: Yes  Pain : 0-10 Pain Score: 5  Pain Type: Chronic pain     Nutritional Status: BMI 25 -29 Overweight Nutritional Risks: None Diabetes: Yes CBG done?: No Did pt. bring in CBG monitor from home?: No  How often do you need to have someone help you when you read instructions, pamphlets, or other written materials from your doctor or pharmacy?: 2 - Rarely  Diabetic?yes   Interpreter Needed?: No      Activities of Daily Living    08/18/2022    3:46 PM  In your present state of health, do you have any difficulty performing the following activities:  Hearing? 0  Vision? 0  Difficulty concentrating or making decisions? 1  Walking or climbing stairs? 0  Dressing or bathing? 0  Doing errands, shopping? 0  Preparing Food and eating ? N  Using the Toilet? N  In the past six months, have you accidently leaked urine? Y  Do you have problems with loss of bowel control? N  Managing your Medications? N  Managing your Finances? Y  Housekeeping or managing your Housekeeping? N    Patient Care Team: Natalia Leatherwood, DO as PCP - General (Family Medicine) Jens Som Madolyn Frieze, MD as PCP - Cardiology  (Cardiology) Rennis Chris, MD as Consulting Physician (Ophthalmology)  Indicate any recent Medical Services you may have received from other than [facility] providers in the past year (date may be approximate).     Assessment:   This is a routine wellness examination for Benford.  Hearing/Vision screen No results found.  Dietary issues and exercise activities discussed: Current Exercise Habits: Home exercise routine, Time (Minutes): 60, Frequency (Times/Week): 3, Weekly Exercise (Minutes/Week): 180, Intensity: Mild   Goals Addressed   None   Depression Screen    08/19/2022   12:57 PM 11/10/2021    2:10 PM 08/18/2021    8:38 AM 09/23/2020    2:57 PM 02/13/2020    8:34 AM 06/04/2019    3:26 PM 02/12/2019    8:41 AM  PHQ 2/9 Scores  PHQ - 2 Score 0 0 2 0 1 6 3   PHQ- 9 Score   4   16 4     Fall Risk    08/19/2022   12:57 PM 11/10/2021    2:10 PM 08/18/2021    8:41 AM 09/23/2020    2:57 PM 06/18/2020   11:33 AM  Fall Risk   Falls in the past year? 1 0 0 0 0  Number falls in past yr: 0 0 0 0 0  Injury with Fall? 0 0 0 0 0  Risk for fall due to : History of fall(s) No Fall Risks     Follow up Falls evaluation completed Falls evaluation completed Falls evaluation completed;Education provided;Falls prevention discussed      FALL RISK PREVENTION PERTAINING TO THE HOME:  Any stairs in or around the home? Yes  If so, are there any without handrails? No  Home free of loose throw rugs in walkways, pet beds, electrical cords, etc? No  Adequate lighting in your home to reduce risk of falls? Yes   ASSISTIVE DEVICES UTILIZED TO PREVENT FALLS:  Life alert? No  Use of a  cane, walker or w/c? No  Grab bars in the bathroom? No  Shower chair or bench in shower? No  Elevated toilet seat or a handicapped toilet? Yes   TIMED UP AND GO:  Was the test performed? No .  Length of time to ambulate 10 feet: n/a sec.     Cognitive Function:        08/19/2022   12:58 PM  6CIT Screen  What  Year? 0 points  What month? 0 points  What time? 0 points  Count back from 20 0 points  Months in reverse 0 points  Repeat phrase 0 points  Total Score 0 points    Immunizations Immunization History  Administered Date(s) Administered   Fluad Quad(high Dose 65+) 12/30/2019, 09/23/2020, 08/11/2021, 08/09/2022   Influenza,inj,Quad PF,6+ Mos 08/30/2017, 11/29/2018   Influenza,inj,quad, With Preservative 08/30/2017   Influenza-Unspecified 11/28/2016   PFIZER(Purple Top)SARS-COV-2 Vaccination 01/12/2020, 02/04/2020   Pneumococcal Conjugate-13 02/12/2019   Pneumococcal Polysaccharide-23 03/28/2014, 12/30/2019   Tdap 04/16/2015   Zoster Recombinat (Shingrix) 02/12/2019    TDAP status: Up to date  Flu Vaccine status: Up to date  Pneumococcal vaccine status: Up to date  Covid-19 vaccine status: Completed vaccines  Qualifies for Shingles Vaccine? Yes   Zostavax completed No   Shingrix Completed?: No.    Education has been provided regarding the importance of this vaccine. Patient has been advised to call insurance company to determine out of pocket expense if they have not yet received this vaccine. Advised may also receive vaccine at local pharmacy or Health Dept. Verbalized acceptance and understanding.  Screening Tests Health Maintenance  Topic Date Due   Fecal DNA (Cologuard)  02/23/2022   COVID-19 Vaccine (3 - Pfizer series) 08/25/2022 (Originally 03/31/2020)   Zoster Vaccines- Shingrix (2 of 2) 08/02/2024 (Originally 04/09/2019)   HEMOGLOBIN A1C  02/07/2023   Diabetic kidney evaluation - GFR measurement  04/30/2023   Diabetic kidney evaluation - Urine ACR  04/30/2023   OPHTHALMOLOGY EXAM  07/14/2023   FOOT EXAM  08/10/2023   TETANUS/TDAP  04/15/2025   Pneumonia Vaccine 85+ Years old  Completed   INFLUENZA VACCINE  Completed   Hepatitis C Screening  Completed   HPV VACCINES  Aged Out    Health Maintenance  Health Maintenance Due  Topic Date Due   Fecal DNA (Cologuard)   02/23/2022    Colorectal cancer screening: Type of screening: Cologuard. Completed ordered 04/2022. Repeat every 3 years  Lung Cancer Screening: (Low Dose CT Chest recommended if Age 76-80 years, 30 pack-year currently smoking OR have quit w/in 15years.) does not qualify.   Lung Cancer Screening Referral: n/a  Additional Screening:  Hepatitis C Screening: does qualify; Completed 01/16/17  Vision Screening: Recommended annual ophthalmology exams for early detection of glaucoma and other disorders of the eye. Is the patient up to date with their annual eye exam?  Yes  Who is the provider or what is the name of the office in which the patient attends annual eye exams? N/a If pt is not established with a provider, would they like to be referred to a provider to establish care? No .   Dental Screening: Recommended annual dental exams for proper oral hygiene  Community Resource Referral / Chronic Care Management: CRR required this visit?  Yes   CCM required this visit?  No      Plan:     I have personally reviewed and noted the following in the patient's chart:   Medical and social history Use  of alcohol, tobacco or illicit drugs  Current medications and supplements including opioid prescriptions. Patient is not currently taking opioid prescriptions. Functional ability and status Nutritional status Physical activity Advanced directives List of other physicians Hospitalizations, surgeries, and ER visits in previous 12 months Vitals Screenings to include cognitive, depression, and falls Referrals and appointments  In addition, I have reviewed and discussed with patient certain preventive protocols, quality metrics, and best practice recommendations. A written personalized care plan for preventive services as well as general preventive health recommendations were provided to patient.     Beatrix Fetters, [place]   08/19/2022   Nurse Notes: Non-Face to Face or Face to Face 5  minute visit Encounter    Mr. Nowak , Thank you for taking time to come for your Medicare Wellness Visit. I appreciate your ongoing commitment to your health goals. Please review the following plan we discussed and let me know if I can assist you in the future.   These are the goals we discussed:  Goals      Patient Stated     Would like to get A1C down        This is a list of the screening recommended for you and due dates:  Health Maintenance  Topic Date Due   Cologuard (Stool DNA test)  02/23/2022   COVID-19 Vaccine (3 - Pfizer series) 08/25/2022*   Zoster (Shingles) Vaccine (2 of 2) 08/02/2024*   Hemoglobin A1C  02/07/2023   Yearly kidney function blood test for diabetes  04/30/2023   Yearly kidney health urinalysis for diabetes  04/30/2023   Eye exam for diabetics  07/14/2023   Complete foot exam   08/10/2023   Tetanus Vaccine  04/15/2025   Pneumonia Vaccine  Completed   Flu Shot  Completed   Hepatitis C Screening: USPSTF Recommendation to screen - Ages 18-79 yo.  Completed   HPV Vaccine  Aged Out  *Topic was postponed. The date shown is not the original due date.

## 2022-08-18 NOTE — Patient Instructions (Signed)
Health Maintenance, Male Adopting a healthy lifestyle and getting preventive care are important in promoting health and wellness. Ask your health care provider about: The right schedule for you to have regular tests and exams. Things you can do on your own to prevent diseases and keep yourself healthy. What should I know about diet, weight, and exercise? Eat a healthy diet  Eat a diet that includes plenty of vegetables, fruits, low-fat dairy products, and lean protein. Do not eat a lot of foods that are high in solid fats, added sugars, or sodium. Maintain a healthy weight Body mass index (BMI) is a measurement that can be used to identify possible weight problems. It estimates body fat based on height and weight. Your health care provider can help determine your BMI and help you achieve or maintain a healthy weight. Get regular exercise Get regular exercise. This is one of the most important things you can do for your health. Most adults should: Exercise for at least 150 minutes each week. The exercise should increase your heart rate and make you sweat (moderate-intensity exercise). Do strengthening exercises at least twice a week. This is in addition to the moderate-intensity exercise. Spend less time sitting. Even light physical activity can be beneficial. Watch cholesterol and blood lipids Have your blood tested for lipids and cholesterol at 68 years of age, then have this test every 5 years. You may need to have your cholesterol levels checked more often if: Your lipid or cholesterol levels are high. You are older than 68 years of age. You are at high risk for heart disease. What should I know about cancer screening? Many types of cancers can be detected early and may often be prevented. Depending on your health history and family history, you may need to have cancer screening at various ages. This may include screening for: Colorectal cancer. Prostate cancer. Skin cancer. Lung  cancer. What should I know about heart disease, diabetes, and high blood pressure? Blood pressure and heart disease High blood pressure causes heart disease and increases the risk of stroke. This is more likely to develop in people who have high blood pressure readings or are overweight. Talk with your health care provider about your target blood pressure readings. Have your blood pressure checked: Every 3-5 years if you are 18-39 years of age. Every year if you are 40 years old or older. If you are between the ages of 65 and 75 and are a current or former smoker, ask your health care provider if you should have a one-time screening for abdominal aortic aneurysm (AAA). Diabetes Have regular diabetes screenings. This checks your fasting blood sugar level. Have the screening done: Once every three years after age 45 if you are at a normal weight and have a low risk for diabetes. More often and at a younger age if you are overweight or have a high risk for diabetes. What should I know about preventing infection? Hepatitis B If you have a higher risk for hepatitis B, you should be screened for this virus. Talk with your health care provider to find out if you are at risk for hepatitis B infection. Hepatitis C Blood testing is recommended for: Everyone born from 1945 through 1965. Anyone with known risk factors for hepatitis C. Sexually transmitted infections (STIs) You should be screened each year for STIs, including gonorrhea and chlamydia, if: You are sexually active and are younger than 68 years of age. You are older than 68 years of age and your   health care provider tells you that you are at risk for this type of infection. Your sexual activity has changed since you were last screened, and you are at increased risk for chlamydia or gonorrhea. Ask your health care provider if you are at risk. Ask your health care provider about whether you are at high risk for HIV. Your health care provider  may recommend a prescription medicine to help prevent HIV infection. If you choose to take medicine to prevent HIV, you should first get tested for HIV. You should then be tested every 3 months for as long as you are taking the medicine. Follow these instructions at home: Alcohol use Do not drink alcohol if your health care provider tells you not to drink. If you drink alcohol: Limit how much you have to 0-2 drinks a day. Know how much alcohol is in your drink. In the [place] one drink equals one 12 oz bottle of beer (355 mL), one 5 oz glass of wine (148 mL), or one 1 oz glass of hard liquor (44 mL). Lifestyle Do not use any products that contain nicotine or tobacco. These products include cigarettes, chewing tobacco, and vaping devices, such as e-cigarettes. If you need help quitting, ask your health care provider. Do not use street drugs. Do not share needles. Ask your health care provider for help if you need support or information about quitting drugs. General instructions Schedule regular health, dental, and eye exams. Stay current with your vaccines. Tell your health care provider if: You often feel depressed. You have ever been abused or do not feel safe at home. Summary Adopting a healthy lifestyle and getting preventive care are important in promoting health and wellness. Follow your health care provider's instructions about healthy diet, exercising, and getting tested or screened for diseases. Follow your health care provider's instructions on monitoring your cholesterol and blood pressure. This information is not intended to replace advice given to you by your health care provider. Make sure you discuss any questions you have with your health care provider. Document Revised: 04/05/2021 Document Reviewed: 04/05/2021 Elsevier Patient Education  2023 Elsevier Inc.  

## 2022-08-19 ENCOUNTER — Ambulatory Visit (INDEPENDENT_AMBULATORY_CARE_PROVIDER_SITE_OTHER): Payer: Medicare Other

## 2022-08-19 DIAGNOSIS — Z0000 Encounter for general adult medical examination without abnormal findings: Secondary | ICD-10-CM

## 2022-08-22 ENCOUNTER — Telehealth: Payer: Self-pay

## 2022-08-22 NOTE — Telephone Encounter (Signed)
   Telephone encounter was:  Successful.  08/22/2022 Name: [name] MRN: [id number] DOB: [birth year]  [name] is a 66 y.o. year old male who is a primary care patient of [place], [place], DO . The community resource team was consulted for assistance with [place] guide performed the following interventions: Spoke with patient, he is receiving food stamps but is interested in receiving a list of food pantries. Verified home address mailing food pantry list.  I asked the patient if he needed assistance with anything else and he stated he did not. Patient has my name and number if he does not receive the resource letter in the next 7-10 days. Letter saved in Epic.  Follow Up Plan:  No further follow up planned at this time. The patient has been provided with needed resources.  [place] Resource Care Guide   [email or web address]  ?? [phone]   Website: [email or web address]  [email or web address]  "We don't say no, we SHOW how!"         [facility]

## 2022-10-06 ENCOUNTER — Other Ambulatory Visit: Payer: Self-pay | Admitting: Family Medicine

## 2022-11-02 NOTE — Progress Notes (Signed)
[facility] Quality Team Note  Name: [name] Date of Birth: Feb 28, 1956 MRN: [id number] Date: 11/02/2022  [facility] Quality Team has reviewed this patient's chart, please see recommendations below:  [facility] Quality Other; KED: Kidney Health Evaluation Gap- Patient needs a EGFR Test completed for gap closure. UACR has already been completed.

## 2022-11-23 DIAGNOSIS — E1169 Type 2 diabetes mellitus with other specified complication: Secondary | ICD-10-CM | POA: Diagnosis not present

## 2022-11-23 DIAGNOSIS — Z794 Long term (current) use of insulin: Secondary | ICD-10-CM | POA: Diagnosis not present

## 2022-11-29 ENCOUNTER — Ambulatory Visit: Payer: Medicare Other | Admitting: Family Medicine

## 2022-12-05 ENCOUNTER — Telehealth: Payer: Self-pay | Admitting: *Deleted

## 2022-12-05 ENCOUNTER — Encounter: Payer: Self-pay | Admitting: *Deleted

## 2022-12-05 NOTE — Patient Instructions (Signed)
Visit Information  Thank you for taking time to visit with me today. Please don't hesitate to contact me if I can be of assistance to you.   Following are the goals we discussed today:   Goals Addressed               This Visit's Progress     Diabetes Management (pt-stated)        Care Coordination Interventions: Provided education to patient about basic DM disease process Counseled on importance of regular laboratory monitoring as prescribed Provided patient with written educational materials related to hypo and hyperglycemia and importance of correct treatment Reviewed scheduled/upcoming provider appointments including: sufficient transportation source  Advised patient, providing education and rationale, to check cbg 2-3 X weekly and record, calling Provider for findings outside established parameters Review of patient status, including review of consultants reports, relevant laboratory and other test results, and medications completed Screening for signs and symptoms of depression related to chronic disease state  Assessed social determinant of health barriers Educated on care management services with social workers, pharmacy and care manager for ongoing assistance in managing his diabetes. Will add educational material on diabetes to pt's MyChart as discussed today for ongoing management of care. Pt has committed to eliminating junk foods for healthier snacks to improve CBG current read this AM at 176. Pt understand the importance dietary changes.         Our next appointment is by telephone on 01/05/2023 at 11: AM  Please call the care guide team at [phone] if you need to cancel or reschedule your appointment.   If you are experiencing a Mental Health or [place] or need someone to talk to, please call the Suicide and Crisis Lifeline: 988  Patient verbalizes understanding of instructions and care plan provided today and agrees to view in [place]. Active  MyChart status and patient understanding of how to access instructions and care plan via MyChart confirmed with patient.     The patient has been provided with contact information for the care management team and has been advised to call with any health related questions or concerns.  Will follow up with ongoing care management services next month.   Raina Mina, RN Care Management Coordinator [place] Office [phone]

## 2022-12-05 NOTE — Patient Outreach (Signed)
  Care Coordination   Initial Visit Note   12/05/2022 Name: [name] MRN: [id number] DOB: [birth year]  [name] is a 68 y.o. year old male who sees Kuneff, Renee A, DO for primary care. I spoke with  Lisabeth Pick [place] by phone today.  What matters to the patients health and wellness today?  Managing my diabetes    Goals Addressed               This Visit's Progress     Diabetes Management (pt-stated)        Care Coordination Interventions: Provided education to patient about basic DM disease process Counseled on importance of regular laboratory monitoring as prescribed Provided patient with written educational materials related to hypo and hyperglycemia and importance of correct treatment Reviewed scheduled/upcoming provider appointments including: sufficient transportation source  Advised patient, providing education and rationale, to check cbg 2-3 X weekly and record, calling Provider for findings outside established parameters Review of patient status, including review of consultants reports, relevant laboratory and other test results, and medications completed Screening for signs and symptoms of depression related to chronic disease state  Assessed social determinant of health barriers Educated on care management services with social workers, pharmacy and care manager for ongoing assistance in managing his diabetes. Will add educational material on diabetes to pt's MyChart as discussed today for ongoing management of care. Pt has committed to eliminating junk foods for healthier snacks to improve CBG current read this AM at 176. Pt understand the importance dietary changes.         SDOH assessments and interventions completed:  Yes  SDOH Interventions Today    Flowsheet Row Most Recent Value  SDOH Interventions   Food Insecurity Interventions Intervention Not Indicated  Housing Interventions Intervention Not Indicated  Transportation Interventions Intervention  Not Indicated  Utilities Interventions Intervention Not Indicated        Care Coordination Interventions:  Yes, provided   Follow up plan: Follow up call scheduled for 01/05/2023 @ 11:00 AM    Encounter Outcome:  Pt. Visit Completed   Raina Mina, RN Care Management Coordinator [place] Office [phone]

## 2022-12-06 DIAGNOSIS — Z1211 Encounter for screening for malignant neoplasm of colon: Secondary | ICD-10-CM | POA: Diagnosis not present

## 2022-12-09 ENCOUNTER — Encounter: Payer: Self-pay | Admitting: Family Medicine

## 2022-12-09 ENCOUNTER — Ambulatory Visit (INDEPENDENT_AMBULATORY_CARE_PROVIDER_SITE_OTHER): Payer: 59 | Admitting: Family Medicine

## 2022-12-09 VITALS — BP 113/69 | HR 55 | Temp 98.2°F | Wt 208.6 lb

## 2022-12-09 DIAGNOSIS — E559 Vitamin D deficiency, unspecified: Secondary | ICD-10-CM | POA: Diagnosis not present

## 2022-12-09 DIAGNOSIS — Z1211 Encounter for screening for malignant neoplasm of colon: Secondary | ICD-10-CM | POA: Diagnosis not present

## 2022-12-09 DIAGNOSIS — G5601 Carpal tunnel syndrome, right upper limb: Secondary | ICD-10-CM | POA: Diagnosis not present

## 2022-12-09 DIAGNOSIS — I10 Essential (primary) hypertension: Secondary | ICD-10-CM | POA: Diagnosis not present

## 2022-12-09 DIAGNOSIS — F5101 Primary insomnia: Secondary | ICD-10-CM

## 2022-12-09 DIAGNOSIS — E1169 Type 2 diabetes mellitus with other specified complication: Secondary | ICD-10-CM

## 2022-12-09 DIAGNOSIS — I77810 Thoracic aortic ectasia: Secondary | ICD-10-CM

## 2022-12-09 DIAGNOSIS — I2510 Atherosclerotic heart disease of native coronary artery without angina pectoris: Secondary | ICD-10-CM

## 2022-12-09 DIAGNOSIS — E113293 Type 2 diabetes mellitus with mild nonproliferative diabetic retinopathy without macular edema, bilateral: Secondary | ICD-10-CM | POA: Diagnosis not present

## 2022-12-09 DIAGNOSIS — E1142 Type 2 diabetes mellitus with diabetic polyneuropathy: Secondary | ICD-10-CM | POA: Diagnosis not present

## 2022-12-09 DIAGNOSIS — I252 Old myocardial infarction: Secondary | ICD-10-CM

## 2022-12-09 DIAGNOSIS — E785 Hyperlipidemia, unspecified: Secondary | ICD-10-CM

## 2022-12-09 DIAGNOSIS — Z955 Presence of coronary angioplasty implant and graft: Secondary | ICD-10-CM | POA: Diagnosis not present

## 2022-12-09 DIAGNOSIS — F411 Generalized anxiety disorder: Secondary | ICD-10-CM

## 2022-12-09 LAB — POCT GLYCOSYLATED HEMOGLOBIN (HGB A1C)
HbA1c POC (<> result, manual entry): 8.2 % (ref 4.0–5.6)
HbA1c, POC (controlled diabetic range): 8.2 % — AB (ref 0.0–7.0)
HbA1c, POC (prediabetic range): 8.2 % — AB (ref 5.7–6.4)
Hemoglobin A1C: 8.2 % — AB (ref 4.0–5.6)

## 2022-12-09 MED ORDER — LANTUS SOLOSTAR 100 UNIT/ML ~~LOC~~ SOPN: 65.0000 [IU] | PEN_INJECTOR | Freq: Every day | SUBCUTANEOUS | 1 refills | Status: DC

## 2022-12-09 MED ORDER — METOPROLOL SUCCINATE ER 25 MG PO TB24: 25.0000 mg | ORAL_TABLET | Freq: Every day | ORAL | 1 refills | Status: DC

## 2022-12-09 MED ORDER — NAPROXEN 500 MG PO TABS: ORAL_TABLET | ORAL | 1 refills | Status: DC

## 2022-12-09 MED ORDER — OMEGA-3-ACID ETHYL ESTERS 1 G PO CAPS: 1.0000 | ORAL_CAPSULE | Freq: Every day | ORAL | 2 refills | Status: DC

## 2022-12-09 MED ORDER — LISINOPRIL 20 MG PO TABS: 20.0000 mg | ORAL_TABLET | Freq: Every day | ORAL | 1 refills | Status: DC

## 2022-12-09 MED ORDER — DIAZEPAM 5 MG PO TABS: 5.0000 mg | ORAL_TABLET | Freq: Four times a day (QID) | ORAL | 5 refills | Status: DC | PRN

## 2022-12-09 MED ORDER — JANUMET XR 100-1000 MG PO TB24: 1.0000 | ORAL_TABLET | Freq: Every day | ORAL | 1 refills | Status: DC

## 2022-12-09 MED ORDER — TRAZODONE HCL 100 MG PO TABS: 100.0000 mg | ORAL_TABLET | Freq: Every evening | ORAL | 1 refills | Status: DC | PRN

## 2022-12-09 NOTE — Patient Instructions (Addendum)
Return in about 15 weeks (around 03/24/2023) for Routine chronic condition follow-up.        Great to see you today.  I have refilled the medication(s) we provide.   If labs were collected, we will inform you of lab results once received either by echart message or telephone call.   - echart message- for normal results that have been seen by the patient already.   - telephone call: abnormal results or if patient has not viewed results in their echart.

## 2022-12-09 NOTE — Progress Notes (Signed)
[name] , [birth year], 67 y.o., male MRN: [id number] Patient Care Team    Relationship Specialty Notifications Start End  Natalia Leatherwood, DO PCP - General Family Medicine  11/29/18   Lewayne Bunting, MD PCP - Cardiology Cardiology  08/07/20   Rennis Chris, MD Consulting Physician Ophthalmology  01/09/19   Alejandro Mulling, RN [facility] Care Management   12/05/22     Chief Complaint  Patient presents with   Diabetes     Subjective: Pt presents for an OV for  Diabetes with complications/overweight:  Patient reports he has been a diabetic since 1998.   He reports with compliance w/  Janumet/Metformin 1000-100 mg daily, Farxiga 10 mg daily and Lantus 55 units daily.  Patient denies dizziness, hyperglycemic or hypoglycemic events. Patient denies numbness, tingling in the extremities or nonhealing wounds of feet.  He reports he is not exercising and he is not watching his diet well at this current time.  Depression/anxiety/insomnia: Patient reports he is feeling well  with Valium 4 times daily and trazodone 100 mg nightly.    He had been on BuSpar as well which he originally thought was helpful, but then he felt that it was not as helpful.   [place] controlled substance database reviewed today Original note: He states he has a rather routine day.  He does not have much social contact in a day.  He lives by himself.  He reports that he always feels like he "stays on edge.  "He reports when he wakes up he is already feeling on edge and he will take a Valium at that time.  He then has breakfast and goes on a 4 mile walk daily.  For lunch he starts to feel his anxiety sudden and he becomes panicked.  He states when he feels this way he will break out in a sweat, become very fearful and scared.  He states he commonly fears there is someone in the house with a gun.  He goes to his bedroom gets in his bed. He states the fear is so intense and he just cannot control it.   He denies any known trauma or event experienced in his life that would create such a fear.  He states he did go see a counselor/psychiatrist at [facility] in the past 6 months, and he did not feel that was helpful for him.  Denies any SI or HI.  Hypertension/CAD/S/p Stent/prior MI:  Pt has a significant cardiac history of "anterior MI in 2004 with stents was LAD and then perhaps stents to branch vessel 2008 (by DR. Renaldo-cardio note 2015)" Patient reports compliance with lisinopril 2 mg daily and metoprolol 25 mg daily, Lovaza, and Crestor.   He is not compliant with baby aspirin 81 mg daily.  Plavix was DC'd by cards.  His statin was increased by cardiology to Crestor 40 mg daily. Patient denies chest pain, shortness of breath, dizziness or lower extremity edema.  Diet: Low-sodium Exercise: Routine exercise daily RF: Hypertension, hyperlipidemia, diabetes, overweight, family history, CAD, MI   Neck pain/back pain: pt reports about the same.  The naproxen as needed.  Prior note: Patient has had recurrent posterior neck pain and headache.  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  He states he found an old prescription of naproxen and restarted it the last time he had symptoms.  He states it worked well for  him.  He is wondering if we could get refills for him today.  In the past he has stated he could not tolerate NSAIDs.     Erectile dysfunction: (Documentation only) patient would like to start a medication for erectile dysfunction.  He states he seen a urologist many years ago and does not desire to see a urologist again.  Patient has a significant medical history of blood pressure with systolic less than 287 and history of CAD and MI.   Last echo 03/06/2020:  1. Technically difficult study. Left ventricular ejection fraction, by  estimation, is 45 to 50%. The left ventricle has grossly mildly decreased  function. Image quality is not sufficient to  assess for regional wall  motion abnormalities. Left ventricular   diastolic parameters are indeterminate.   2. Right ventricle is poorly visualized. Grossly normal size and systolic  function. Tricuspid regurgitation signal is inadequate for assessing PA  pressure.   3. The mitral valve is normal in structure. No evidence of mitral valve  regurgitation.   4. Aortic dilatation noted. There is mild dilatation of the ascending  aorta measuring 37 mm.   5. The aortic valve is abnormal. Moderately calcified. Aortic valve  regurgitation is not visualized. Mild aortic valve stenosis. Vmax 2.2 m/s,  MG 10 mmHg, AVA 1.7 cm^2, DI 0.4      12/09/2022    1:43 PM 12/05/2022    9:54 AM 08/19/2022   12:57 PM 11/10/2021    2:10 PM 08/18/2021    8:38 AM  Depression screen PHQ 2/9  Decreased Interest 2 1 0 0 1  Down, Depressed, Hopeless 0 0 0 0 1  PHQ - 2 Score 2 1 0 0 2  Altered sleeping 0    0  Tired, decreased energy 2    0  Change in appetite 3    1  Feeling bad or failure about yourself  0    1  Trouble concentrating 2    0  Moving slowly or fidgety/restless 0    0  Suicidal thoughts 0    0  PHQ-9 Score 9    4  Difficult doing work/chores     Not difficult at all    Allergies  Allergen Reactions   Gabapentin Other (See Comments)    Paranoia/hallucinations   Hydrocodone Other (See Comments)    PT reports  Confusion when tacking Hydrocodone   Nsaids     History of heart disease, CAD with history of MI, CABG status post stent   Prednisone     Paranoia   Tramadol    Penicillins Other (See Comments)    Childhood unsure if still allgeric   Social History   Social History Narrative   Marital status/children/pets: Single   Education/employment: HS grad, 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:      -smoke alarm in the home:Yes     - wears seatbelt: Yes      Past Medical History:  Diagnosis Date   Alcohol addiction ([place])    Anxiety    Arthritis    Bulging of lumbar intervertebral  disc 2017   MRI: L3-4, L4-5 and L5-S1- facet hypertrophy and formainal stenosis   CAD (coronary artery disease)    Coronary artery disease with hx of myocardial infarct w/o hx of CABG 2004   Depression    Dextrocardia    Diabetes ([place])    Heart attack ([place]) 2004   Stent placement 2008   Hyperlipidemia    Hypertension  Insomnia    Past Surgical History:  Procedure Laterality Date   CORONARY ANGIOPLASTY WITH STENT PLACEMENT  2008   Family History  Problem Relation Age of Onset   Heart attack Mother    Heart disease Mother    Diabetes Mother    Arthritis Mother    Hyperlipidemia Mother    Hypertension Mother    Breast cancer Mother    Heart attack Father    Heart disease Father    Alcohol abuse Father    Early death Father    Arthritis Sister    Heart disease Sister    Hyperlipidemia Sister    Asthma Sister    COPD Sister    Diabetes Sister    Alcohol abuse Sister    Allergies as of 12/09/2022       Reactions   Gabapentin Other (See Comments)   Paranoia/hallucinations   Hydrocodone Other (See Comments)   PT reports  Confusion when tacking Hydrocodone   Nsaids    History of heart disease, CAD with history of MI, CABG status post stent   Prednisone    Paranoia   Tramadol    Penicillins Other (See Comments)   Childhood unsure if still allgeric        Medication List        Accurate as of December 09, 2022  2:30 PM. If you have any questions, ask your nurse or doctor.          STOP taking these medications    cholecalciferol 25 MCG (1000 UNIT) tablet Commonly known as: VITAMIN D3 Stopped by: Felix Pacini, DO   Vitamin D (Ergocalciferol) 1.25 MG (50000 UNIT) Caps capsule Commonly known as: DRISDOL Stopped by: Felix Pacini, DO       TAKE these medications    Accu-Chek FastClix Lancets Misc   aspirin EC 81 MG tablet Take 1 tablet (81 mg total) by mouth daily.   dapagliflozin propanediol 10 MG Tabs tablet Commonly known as: Farxiga TAKE 1  TABLET BY MOUTH  DAILY BEFORE BREAKFAST   diazepam 5 MG tablet Commonly known as: VALIUM Take 1 tablet (5 mg total) by mouth every 6 (six) hours as needed for anxiety.   GNP UltiCare Pen Needles 31G X 5 MM Misc Generic drug: Insulin Pen Needle AS DIRECTED ONCE DAILY   Janumet XR [phone] MG Tb24 Generic drug: SitaGLIPtin-MetFORMIN HCl Take 1 tablet by mouth daily.   Lantus SoloStar 100 UNIT/ML Solostar Pen Generic drug: insulin glargine Inject 65 Units into the skin daily. What changed:  how much to take how to take this when to take this additional instructions Changed by: Felix Pacini, DO   lisinopril 20 MG tablet Commonly known as: ZESTRIL Take 1 tablet (20 mg total) by mouth daily.   metoprolol succinate 25 MG 24 hr tablet Commonly known as: TOPROL-XL Take 1 tablet (25 mg total) by mouth daily.   naproxen 500 MG tablet Commonly known as: NAPROSYN Every 12 hours with food as needed only   nitroGLYCERIN 0.4 MG SL tablet Commonly known as: NITROSTAT DISSOLVE 1 TABLET UNDER THE TONGUE EVERY 5 MINUTES AS  NEEDED FOR CHEST PAIN. MAX  OF 3 TABLETS IN 15 MINUTES. CALL 911 IF PAIN PERSISTS.   omega-3 acid ethyl esters 1 g capsule Commonly known as: LOVAZA Take 1 capsule (1 g total) by mouth daily.   OneTouch Ultra test strip Generic drug: glucose blood CHECK BLOOD SUGAR AS DIRECTED   rosuvastatin 40 MG tablet Commonly known as: CRESTOR Take  1 tablet (40 mg total) by mouth at bedtime.   traZODone 100 MG tablet Commonly known as: DESYREL Take 1 tablet (100 mg total) by mouth at bedtime as needed for sleep.        All past medical history, surgical history, allergies, family history, immunizations andmedications were updated in the EMR today and reviewed under the history and medication portions of their EMR.     ROS: Negative, with the exception of above mentioned in HPI   Objective:  BP 113/69   Pulse (!) 55   Temp 98.2 F (36.8 C)   Wt 208 lb 9.6 oz  (94.6 kg)   SpO2 95%   BMI 29.93 kg/m  Body mass index is 29.93 kg/m. Physical Exam Vitals and nursing note reviewed.  Constitutional:      General: He is not in acute distress.    Appearance: Normal appearance. He is not ill-appearing, toxic-appearing or diaphoretic.  HENT:     Head: Normocephalic and atraumatic.  Eyes:     General: No scleral icterus.       Right eye: No discharge.        Left eye: No discharge.     Extraocular Movements: Extraocular movements intact.     Pupils: Pupils are equal, round, and reactive to light.  Cardiovascular:     Rate and Rhythm: Normal rate and regular rhythm.     Heart sounds: Murmur heard.  Pulmonary:     Effort: Pulmonary effort is normal. No respiratory distress.     Breath sounds: Normal breath sounds. No wheezing, rhonchi or rales.  Musculoskeletal:     Right lower leg: No edema.     Left lower leg: No edema.  Skin:    General: Skin is warm.     Findings: No rash.  Neurological:     Mental Status: He is alert and oriented to person, place, and time. Mental status is at baseline.  Psychiatric:        Mood and Affect: Mood normal.        Behavior: Behavior normal.        Thought Content: Thought content normal.        Judgment: Judgment normal.    Diabetic Foot Exam - Simple   Simple Foot Form Diabetic Foot exam was performed with the following findings: Yes 12/09/2022  2:13 PM  Visual Inspection No deformities, no ulcerations, no other skin breakdown bilaterally: Yes Sensation Testing Intact to touch and monofilament testing bilaterally: Yes Pulse Check Posterior Tibialis and Dorsalis pulse intact bilaterally: Yes Comments     No results found. No results found. Results for orders placed or performed in visit on 12/09/22 (from the past 24 hour(s))  POCT HgB A1C     Status: Abnormal   Collection Time: 12/09/22  2:19 PM  Result Value Ref Range   Hemoglobin A1C 8.2 (A) 4.0 - 5.6 %   HbA1c POC (<> result, manual entry)  8.2 4.0 - 5.6 %   HbA1c, POC (prediabetic range) 8.2 (A) 5.7 - 6.4 %   HbA1c, POC (controlled diabetic range) 8.2 (A) 0.0 - 7.0 %      Assessment/Plan: ALTAN KRAAI is a 67 y.o. male present for OV for  Dyslipidemia associated with type 2 diabetes mellitus (HCC)/diabetic peripheral neuropathy/mild nonproliferative diabetic retinopathy of both eyes/overweight:  He has not been following a diabetic diet> but "doing better" Suspect diet, lack of exercise and alcohol consumption to be playing a factor in his inability to  decrease his A1c despite increasing amounts of medications. He reports he will try to exercise.  We discussed indoor types of exercise that he can do at home. -Continue janumet [phone] daily -Continue farixga 10 mg daily -increase Lantus 65 units   PNA series: Completed after 63 Flu shot: UTD 2023 Foot exam: 08/09/2022 Eye exam:  Completed UTD 07/13/2022 A1c:10.4>>>9.4>>  9.7>>11.8>> 8.5 >>> 6.5 >>>7.1>7.6>7.7>8.7> 8.8 on 04/20/2021>8.3 > 8.7 > 7.4 > 7.6> 8.2 today  Anxiety/depression/insomnia: Stable Has a history of significant depression with anxiety and paranoid features. Continue Valium 4 times daily Continue trazodone to 100 mg daily Meds tried in the past: BuSpar patient reports it was not effective.  Paxil patient reports made him more anxious. -  [place] controlled substance database reviewed today  Patient aware he needs a face-to-face appointment in order for any refills to be processed -Contract signed -Offered referral to counseling or psychiatry; he declined. Follow-up required every 5.5 months    Hypertension/CAD/S/p Stent/prior MI/hypercholesteremia/aortic stenosis/aortic dilatation/LVH:  Pt has a significant cardiac history of "anterior MI in 2004 with stents was LAD and then stents to a branch vessel 2008 (by DR. Renaldo- cardio note 2015)" Continue lisinopril to 20 mg.  -Continue metoprolol 25 mg daily -Continue aspirin 81 mg daily.   Cardiology discontinued Plavix  -Continue Crestor 40 mg daily Diet: Low-sodium Exercise: Routine exercise daily - now established with cardiology as well for routine follow up.   Memory changes/vitamin D deficiency: He has been taking vitamin D supplement OTC, he is not certain the exact amount he is taking. He has not noticed any changes in his memory. He is on a benzodiazepine and trazodone which could be contributing to his memory decline, especially when he is consuming alcohol.  This was discussed with him today. He was strongly encouraged not to consume alcohol.  If he is expecting could to consume alcohol he is to avoid taking the Valium.  Colon cancer screening: Went ahead and ordered his Cologuard testing.    Severe carpal tunnel: Patient has history of severe carpal tunnel in his right hand by nerve conduction study in 2017.  He states is now get to the point where he is dropping almost everything he picks up secondary to the numbness in his thumb, index and middle finger.  He would like referral to a hand surgeon today  Hand surgery referral placed  Reviewed expectations re: course of current medical issues. Discussed self-management of symptoms. Outlined signs and symptoms indicating need for more acute intervention. Patient verbalized understanding and all questions were answered. Patient received an After-Visit Summary.    Orders Placed This Encounter  Procedures   Comprehensive metabolic panel   Cologuard   CBC   Direct LDL   Ambulatory referral to Hand Surgery   POCT HgB A1C    Meds ordered this encounter  Medications   JANUMET XR [phone] MG TB24    Sig: Take 1 tablet by mouth daily.    Dispense:  90 tablet    Refill:  1   lisinopril (ZESTRIL) 20 MG tablet    Sig: Take 1 tablet (20 mg total) by mouth daily.    Dispense:  90 tablet    Refill:  1    DISCONTINUE combo lisinopril-hctz please. 3rd request   metoprolol succinate (TOPROL-XL) 25 MG 24 hr tablet     Sig: Take 1 tablet (25 mg total) by mouth daily.    Dispense:  90 tablet    Refill:  1  naproxen (NAPROSYN) 500 MG tablet    Sig: Every 12 hours with food as needed only    Dispense:  180 tablet    Refill:  1   omega-3 acid ethyl esters (LOVAZA) 1 g capsule    Sig: Take 1 capsule (1 g total) by mouth daily.    Dispense:  90 capsule    Refill:  2    Requesting 1 year supply   traZODone (DESYREL) 100 MG tablet    Sig: Take 1 tablet (100 mg total) by mouth at bedtime as needed for sleep.    Dispense:  90 tablet    Refill:  1   diazepam (VALIUM) 5 MG tablet    Sig: Take 1 tablet (5 mg total) by mouth every 6 (six) hours as needed for anxiety.    Dispense:  120 tablet    Refill:  5   insulin glargine (LANTUS SOLOSTAR) 100 UNIT/ML Solostar Pen    Sig: Inject 65 Units into the skin daily.    Dispense:  60 mL    Refill:  1    USE THIS SCRIPT PLEASE!!! DC ALL THE other scripts please.     Referral Orders         Ambulatory referral to Hand Surgery         Note is dictated utilizing voice recognition software. Although note has been proof read prior to signing, occasional typographical errors still can be missed. If any questions arise, please do not hesitate to call for verification.   electronically signed by:  Felix Pacini, DO  [facility] [place]

## 2022-12-10 LAB — CBC
HCT: 48.8 % (ref 38.5–50.0)
Hemoglobin: 16.7 g/dL (ref 13.2–17.1)
MCH: 30.3 pg (ref 27.0–33.0)
MCHC: 34.2 g/dL (ref 32.0–36.0)
MCV: 88.4 fL (ref 80.0–100.0)
MPV: 11.6 fL (ref 7.5–12.5)
Platelets: 219 10*3/uL (ref 140–400)
RBC: 5.52 10*6/uL (ref 4.20–5.80)
RDW: 12.9 % (ref 11.0–15.0)
WBC: 7.1 10*3/uL (ref 3.8–10.8)

## 2022-12-10 LAB — COMPREHENSIVE METABOLIC PANEL
AG Ratio: 1.6 (calc) (ref 1.0–2.5)
ALT: 28 U/L (ref 9–46)
AST: 23 U/L (ref 10–35)
Albumin: 4.6 g/dL (ref 3.6–5.1)
Alkaline phosphatase (APISO): 42 U/L (ref 35–144)
BUN: 21 mg/dL (ref 7–25)
CO2: 22 mmol/L (ref 20–32)
Calcium: 9.9 mg/dL (ref 8.6–10.3)
Chloride: 99 mmol/L (ref 98–110)
Creat: 0.82 mg/dL (ref 0.70–1.35)
Globulin: 2.9 g/dL (calc) (ref 1.9–3.7)
Glucose, Bld: 152 mg/dL — ABNORMAL HIGH (ref 65–99)
Potassium: 4.4 mmol/L (ref 3.5–5.3)
Sodium: 138 mmol/L (ref 135–146)
Total Bilirubin: 0.3 mg/dL (ref 0.2–1.2)
Total Protein: 7.5 g/dL (ref 6.1–8.1)

## 2022-12-10 LAB — LDL CHOLESTEROL, DIRECT: Direct LDL: 51 mg/dL (ref <100)

## 2022-12-21 LAB — COLOGUARD: COLOGUARD: NEGATIVE

## 2022-12-22 ENCOUNTER — Telehealth: Payer: Self-pay | Admitting: *Deleted

## 2022-12-22 NOTE — Patient Outreach (Signed)
  Care Coordination   Follow Up Visit Note   12/22/2022 Name: [name] MRN: [id number] DOB: 25-Jul-1956  [name] is a 67 y.o. year old male who sees Kuneff, Renee A, DO for primary care. I spoke with  [name] by phone today.  What matters to the patients health and wellness today?  No needs on this call    Goals Addressed             This Visit's Progress    COMPLETED: care coordination activity       Care Coordination Interventions: Collaborated with primary provider's office H[facility] Dyer) regarding pt call. Reached out to pt who indicated someone from the provider's office call him today and office was not sure and assumed it was this Consulting civil engineer.  RN confirmed upcoming appointment on 01/03/2023 @ 11:00am for ongoing care management services.  RN notified provider office that the individual that call pt was directly from the provider's office and to respond appropriately.          SDOH assessments and interventions completed:  No     Care Coordination Interventions:  Yes, provided   Follow up plan: No further intervention required.   Encounter Outcome:  Pt. Visit Completed   Raina Mina, RN Care Management Coordinator [place] Office [phone]

## 2022-12-22 NOTE — Telephone Encounter (Signed)
Patient called our office this morning stating he was returning Lisa's call.  Please call patient at [phone].  Thank you.

## 2022-12-26 ENCOUNTER — Telehealth: Payer: Self-pay | Admitting: Orthopedic Surgery

## 2022-12-26 NOTE — Telephone Encounter (Signed)
[name], this patient called wanting to see Dr. Aline Brochure for his right hand.  No surgery, no ED, no xrays, no other treatment.  He has seen Dr. Ernestina Patches and Dr. Lorin Mercy for other things.  Ok to schedule here?

## 2022-12-28 DIAGNOSIS — Z794 Long term (current) use of insulin: Secondary | ICD-10-CM | POA: Diagnosis not present

## 2022-12-28 DIAGNOSIS — E1169 Type 2 diabetes mellitus with other specified complication: Secondary | ICD-10-CM | POA: Diagnosis not present

## 2022-12-30 ENCOUNTER — Ambulatory Visit: Payer: 59 | Admitting: Orthopaedic Surgery

## 2023-01-03 ENCOUNTER — Encounter: Payer: 59 | Admitting: *Deleted

## 2023-01-03 ENCOUNTER — Telehealth: Payer: Self-pay

## 2023-01-03 NOTE — Patient Instructions (Signed)
Visit Information  Thank you for taking time to visit with me today. Please don't hesitate to contact me if I can be of assistance to you.   Following are the goals we discussed today:   Goals Addressed               This Visit's Progress     Diabetes Management (pt-stated)        Care Coordination Interventions: Provided education to patient about basic DM disease process Reviewed scheduled/upcoming provider appointments including: sufficient transportation source  Advised patient, providing education and rationale, to check cbg 2-3 X weekly and record, calling Provider for findings outside established parameters Review of patient status, including review of consultants reports, relevant laboratory and other test results, and medications completed Assessed social determinant of health barriers Educated on care management services with social workers, pharmacy and care manager for ongoing assistance in managing his diabetes. Will add educational material on diabetes to pt's MyChart as discussed today for ongoing management of care. Patient Blood sugars less than 150.  Patient continuing to limit sweets and carbohydrates.  Patient to see ortho for right hand on 01/06/23 due to some pain.  Interventions Today    Flowsheet Row Most Recent Value  Chronic Disease Discussed/Reviewed   Chronic disease discussed/reviewed during today's visit Diabetes  General Interventions   General Interventions Discussed/Reviewed General Interventions Discussed, Doctor Visits  Doctor Visits Discussed/Reviewed Doctor Visits Discussed, Annual Wellness Visits  Education Interventions   Education Provided Provided Verbal Education  Provided Verbal Education On Eye Care, Blood Sugar Monitoring, Foot Care  Nutrition Interventions   Nutrition Discussed/Reviewed Carbohydrate meal planning, Decreasing sugar intake  Pharmacy Interventions   Pharmacy Dicussed/Reviewed Medication Adherence               Our next appointment is by telephone on 01/31/23 at 1000  Please call the care guide team at [phone] if you need to cancel or reschedule your appointment.   If you are experiencing a Mental Health or [place] or need someone to talk to, please call the Suicide and Crisis Lifeline: 988   The patient verbalized understanding of instructions, educational materials, and care plan provided today and agreed to receive a mailed copy of patient instructions, educational materials, and care plan.   The patient has been provided with contact information for the care management team and has been advised to call with any health related questions or concerns.   Jone Baseman, RN, MSN [facility] Care Management Coordinator Direct Line [phone]

## 2023-01-03 NOTE — Patient Outreach (Signed)
  Care Coordination   Follow Up Visit Note   01/03/2023 Name: [name] MRN: [id number] DOB: 11/21/1956  [name] is a 67 y.o. year old male who sees Kuneff, Renee A, DO for primary care. I spoke with  [name] by phone today.  What matters to the patients health and wellness today?  Right hand bothering him.  To see ortho 01-06-23.    Goals Addressed               This Visit's Progress     Diabetes Management (pt-stated)        Care Coordination Interventions: Provided education to patient about basic DM disease process Reviewed scheduled/upcoming provider appointments including: sufficient transportation source  Advised patient, providing education and rationale, to check cbg 2-3 X weekly and record, calling Provider for findings outside established parameters Review of patient status, including review of consultants reports, relevant laboratory and other test results, and medications completed Assessed social determinant of health barriers Educated on care management services with social workers, pharmacy and care manager for ongoing assistance in managing his diabetes. Will add educational material on diabetes to pt's MyChart as discussed today for ongoing management of care. Patient Blood sugars less than 150.  Patient continuing to limit sweets and carbohydrates.  Patient to see ortho for right hand on 01/06/23 due to some pain.  Interventions Today    Flowsheet Row Most Recent Value  Chronic Disease Discussed/Reviewed   Chronic disease discussed/reviewed during today's visit Diabetes  General Interventions   General Interventions Discussed/Reviewed General Interventions Discussed, Doctor Visits  Doctor Visits Discussed/Reviewed Doctor Visits Discussed, Annual Wellness Visits  Education Interventions   Education Provided Provided Verbal Education  Provided Verbal Education On Eye Care, Blood Sugar Monitoring, Foot Care  Nutrition Interventions   Nutrition  Discussed/Reviewed Carbohydrate meal planning, Decreasing sugar intake  Pharmacy Interventions   Pharmacy Dicussed/Reviewed Medication Adherence              SDOH assessments and interventions completed:  Yes  SDOH Interventions Today    Flowsheet Row Most Recent Value  SDOH Interventions   Transportation Interventions Intervention Not Indicated  Social Connections Interventions Intervention Not Indicated        Care Coordination Interventions:  Yes, provided   Follow up plan: Follow up call scheduled for March    Encounter Outcome:  Pt. Visit Completed   Jone Baseman, RN, MSN [facility] Care Management Coordinator Direct Line [phone]

## 2023-01-05 ENCOUNTER — Encounter: Payer: Medicare Other | Admitting: *Deleted

## 2023-01-06 ENCOUNTER — Encounter: Payer: Self-pay | Admitting: Orthopaedic Surgery

## 2023-01-06 ENCOUNTER — Ambulatory Visit (INDEPENDENT_AMBULATORY_CARE_PROVIDER_SITE_OTHER): Payer: 59 | Admitting: Orthopaedic Surgery

## 2023-01-06 VITALS — BP 111/62 | HR 57 | Ht 71.0 in | Wt 207.0 lb

## 2023-01-06 DIAGNOSIS — G5601 Carpal tunnel syndrome, right upper limb: Secondary | ICD-10-CM | POA: Diagnosis not present

## 2023-01-06 NOTE — Progress Notes (Signed)
Office Visit Note   Patient: [name]           Date of Birth: 08-24-1956           MRN: [id number] Visit Date: 01/06/2023              Requested by: Ma Hillock, DO [address] [place],  [place] [address] PCP: Ma Hillock, DO   Assessment & Plan: Visit Diagnoses:  1. Carpal tunnel syndrome, right upper limb     Plan: Patient like to proceed with outpatient carpal tunnel release.  We discussed postoperative dressing suture removal at 14 days.  Questions were elicited and answered.  Patient understands would like to proceed with surgery.  Follow-Up Instructions: No follow-ups on file.   Orders:  No orders of the defined types were placed in this encounter.  No orders of the defined types were placed in this encounter.     Procedures: No procedures performed   Clinical Data: No additional findings.   Subjective: Chief Complaint  Patient presents with   Right Hand - Numbness, Pain    HPI 67 year old male with right carpal tunnel syndrome thenar atrophy he drops objects constantly.  He has had electrical studies in the past which showed severe bilateral carpal tunnel syndrome and he had the left hand done he is left-hand dominant but never had the right hand done.  He states now right hand is bothering him more and he wants to proceed.  Study done by Dr.Yan listed below was 2017.  He has been through splinting and activity modification.  Review of Systems patient has type 2 diabetes recent epidural given good relief.  Remote history of coronary stenting no chest pain.  For hypertension.  Last A1c's were 7.6 and 8.2.  Recent epidural with Dr. Ernestina Patches and got good relief with back pain symptoms.   Objective: Vital Signs: BP 111/62   Pulse (!) 57   Ht 5' 11"$  (1.803 m)   Wt 207 lb (93.9 kg)   BMI 28.87 kg/m   Physical Exam Constitutional:      Appearance: He is well-developed.  HENT:     Head: Normocephalic and atraumatic.     Right Ear: External ear  normal.     Left Ear: External ear normal.  Eyes:     Pupils: Pupils are equal, round, and reactive to light.  Neck:     Thyroid: No thyromegaly.     Trachea: No tracheal deviation.  Cardiovascular:     Rate and Rhythm: Normal rate.  Pulmonary:     Effort: Pulmonary effort is normal.     Breath sounds: No wheezing.  Abdominal:     General: Bowel sounds are normal.     Palpations: Abdomen is soft.  Musculoskeletal:     Cervical back: Neck supple.  Skin:    General: Skin is warm and dry.     Capillary Refill: Capillary refill takes less than 2 seconds.  Neurological:     Mental Status: He is alert and oriented to person, place, and time.  Psychiatric:        Behavior: Behavior normal.        Thought Content: Thought content normal.        Judgment: Judgment normal.     Ortho Exam right positive Tinel's positive Phalen's test on the right.  Thenar atrophy.  Decreased sensation radial 3 and half fingers.  Intrinsic strength is normal.  Specialty Comments:  MRI LUMBAR SPINE  WITHOUT CONTRAST   TECHNIQUE: Multiplanar, multisequence MR imaging of the lumbar spine was performed. No intravenous contrast was administered.   COMPARISON:  MRI lumbar spine dated February 11, 2016.   FINDINGS: Segmentation:  Standard.   Alignment:  Mild retrolisthesis of L5.   Vertebrae:  No fracture, evidence of discitis, or bone lesion.   Conus medullaris and cauda equina: Conus extends to the L1 level. Conus and cauda equina appear normal.   Paraspinal and other soft tissues: Negative.   Disc levels:   T12-L1: No significant disc bulge. No neural foraminal stenosis. No central canal stenosis.   L1-L2: No significant disc bulge. No neural foraminal stenosis. No central canal stenosis.   L2-L3: Mild disc bulge without significant spinal canal or neural foraminal stenosis.   L3-L4: Broad-based disc protrusion and ligamentum flavum hypertrophy with mild narrowing of bilateral lateral  recesses. No significant neural foraminal stenosis.   L4-L5: Broad-based disc protrusion and ligamentum flavum hypertrophy with narrowing of bilateral lateral recesses. Mild bilateral neural foraminal stenosis, right greater than the left.   L5-S1: No significant disc bulge. No neural foraminal stenosis. No central canal stenosis. Mild facet joint arthropathy.   IMPRESSION: 1.  No evidence of acute fracture   2. Degenerate disc disease of the lumbar spine prominent at L3-L4 and L4-L5 with narrowing of bilateral recesses. Bilateral neural foraminal stenosis at L4-L5, right greater than the left.     Electronically Signed   By: Keane Police D.O.   On: 03/15/2022 17:27  Imaging: No results found.   IMPRESSION:    This is an abnormal study. There is electrodiagnostic evidence of bilateral chronic lumbosacral radiculopathy, mainly involving bilateral L4, L5, S1 myotomes. In addition, there is evidence of chronic right cervical radiculopathy, involving right C5 to C8 myotomes.  There is evidence of severe bilateral median neuropathy across the wrist, consistent with severe bilateral carpal tunnel syndromes, right worse than left.      INTERPRETING PHYSICIAN:    Marcial Pacas M.D. Ph.D. [facility] [address], [place], [place] [address] [phone]  PMFS History: Patient Active Problem List   Diagnosis Date Noted   Carpal tunnel syndrome, right upper limb 01/06/2023   Vitamin D deficiency 11/12/2021   Mild concentric left ventricular hypertrophy (LVH) 06/09/2021   Encounter for screening involving social determinants of health (SDoH) 05/20/2021   Aortic stenosis, mild 05/20/2021   Aortic root dilatation ([place]) 05/20/2021   Facet hypertrophy of lumbosacral region 07/04/2019   Foraminal stenosis of lumbar region 07/04/2019   Mild nonproliferative diabetic retinopathy ([place]) 01/09/2019   Generalized anxiety disorder 09/02/2017   Vasculogenic erectile  dysfunction 09/02/2017   Murmur 12/28/2015   Insomnia 12/28/2015   Diabetic peripheral neuropathy ([place]) 12/21/2015   Bulging of lumbar intervertebral disc 2017   Type 2 diabetes mellitus with hyperlipidemia ([place]) 07/07/2014   S/P coronary artery stent placement 03/31/2014   Coronary artery disease with history of myocardial infarction without history of CABG 03/31/2014   Essential hypertension 03/28/2014   Past Medical History:  Diagnosis Date   Alcohol addiction ([place])    Anxiety    Arthritis    Bulging of lumbar intervertebral disc 2017   MRI: L3-4, L4-5 and L5-S1- facet hypertrophy and formainal stenosis   CAD (coronary artery disease)    Coronary artery disease with hx of myocardial infarct w/o hx of CABG 2004   Depression    Dextrocardia    Diabetes ([place])    Heart attack ([place]) 2004  Stent placement 2008   Hyperlipidemia    Hypertension    Insomnia     Family History  Problem Relation Age of Onset   Heart attack Mother    Heart disease Mother    Diabetes Mother    Arthritis Mother    Hyperlipidemia Mother    Hypertension Mother    Breast cancer Mother    Heart attack Father    Heart disease Father    Alcohol abuse Father    Early death Father    Arthritis Sister    Heart disease Sister    Hyperlipidemia Sister    Asthma Sister    COPD Sister    Diabetes Sister    Alcohol abuse Sister     Past Surgical History:  Procedure Laterality Date   CORONARY ANGIOPLASTY WITH STENT PLACEMENT  2008   Social History   Occupational History   Occupation: Full time    Employer: UNEMPLOYED  Tobacco Use   Smoking status: Never   Smokeless tobacco: Never  Vaping Use   Vaping Use: Never used  Substance and Sexual Activity   Alcohol use: Yes    Alcohol/week: 0.0 standard drinks of alcohol    Comment: Occasional   Drug use: Not Currently    Types: Cocaine, Marijuana    Comment: Former   Sexual activity: Not Currently    Partners: Female

## 2023-01-11 ENCOUNTER — Telehealth: Payer: Self-pay | Admitting: Orthopedic Surgery

## 2023-01-11 NOTE — Telephone Encounter (Signed)
Spoke w/the patient, he stated he doesn't need to schedule, he has an appointment with Dr. Lorin Mercy.

## 2023-01-13 ENCOUNTER — Ambulatory Visit
Admission: RE | Admit: 2023-01-13 | Discharge: 2023-01-13 | Disposition: A | Discharge status: Home / Self Care | Payer: 59 | Source: Ambulatory Visit | Attending: Nurse Practitioner | Admitting: Nurse Practitioner

## 2023-01-13 VITALS — BP 119/70 | HR 67 | Temp 98.9°F | Resp 20

## 2023-01-13 DIAGNOSIS — L03221 Cellulitis of neck: Secondary | ICD-10-CM

## 2023-01-13 MED ORDER — DOXYCYCLINE HYCLATE 100 MG PO TABS: 100.0000 mg | ORAL_TABLET | Freq: Two times a day (BID) | ORAL | 0 refills | Status: AC

## 2023-01-13 NOTE — ED Triage Notes (Signed)
Pt reports having a boil in the neck x 3-4 days.

## 2023-01-13 NOTE — Discharge Instructions (Addendum)
Take medication as prescribed. Warm compresses to the affected area 3-4 times daily. Clean the area at least twice daily with Dial Gold bar soap or another type of antibacterial soap. Keep the area covered while it is draining. Go to the emergency department if you develop fever, chills, generalized fatigue, nausea, vomiting, or if the area of redness spreads into the hairline or down the neck, or if you have foul-smelling drainage.  If symptoms fail to improve, please follow-up with your primary care physician for further evaluation.

## 2023-01-13 NOTE — ED Provider Notes (Signed)
[facility]    CSN: [id number] Arrival date & time: 01/13/23  1508      History   Chief Complaint Chief Complaint  Patient presents with   Appointment    1530   Abscess    HPI [name] is a 67 y.o. male.   The history is provided by the patient.   The patient presents with a 3 to 4-day history of a "boil" to his left neck.  Patient states over the last several days, the area has become more painful and swelling.  Patient denies fever, chills, lays, chest pain, abdominal pain, nausea, vomiting, or diarrhea.  Eats that he put a piece of "fat back" on his neck but did not have any relief of his symptoms.  Patient with a history of diabetes.  Patient's most recent A1c approximately 1 month ago was 8.2.  Past Medical History:  Diagnosis Date   Alcohol addiction ([place])    Anxiety    Arthritis    Bulging of lumbar intervertebral disc 2017   MRI: L3-4, L4-5 and L5-S1- facet hypertrophy and formainal stenosis   CAD (coronary artery disease)    Coronary artery disease with hx of myocardial infarct w/o hx of CABG 2004   Depression    Dextrocardia    Diabetes ([place])    Heart attack ([place]) 2004   Stent placement 2008   Hyperlipidemia    Hypertension    Insomnia     Patient Active Problem List   Diagnosis Date Noted   Carpal tunnel syndrome, right upper limb 01/06/2023   Vitamin D deficiency 11/12/2021   Mild concentric left ventricular hypertrophy (LVH) 06/09/2021   Encounter for screening involving social determinants of health (SDoH) 05/20/2021   Aortic stenosis, mild 05/20/2021   Aortic root dilatation ([place]) 05/20/2021   Facet hypertrophy of lumbosacral region 07/04/2019   Foraminal stenosis of lumbar region 07/04/2019   Mild nonproliferative diabetic retinopathy ([place]) 01/09/2019   Generalized anxiety disorder 09/02/2017   Vasculogenic erectile dysfunction 09/02/2017   Murmur 12/28/2015   Insomnia 12/28/2015   Diabetic peripheral neuropathy ([place])  12/21/2015   Bulging of lumbar intervertebral disc 2017   Type 2 diabetes mellitus with hyperlipidemia ([place]) 07/07/2014   S/P coronary artery stent placement 03/31/2014   Coronary artery disease with history of myocardial infarction without history of CABG 03/31/2014   Essential hypertension 03/28/2014    Past Surgical History:  Procedure Laterality Date   CORONARY ANGIOPLASTY WITH STENT PLACEMENT  2008       Home Medications    Prior to Admission medications   Medication Sig Start Date End Date Taking? Authorizing Provider  doxycycline (VIBRA-TABS) 100 MG tablet Take 1 tablet (100 mg total) by mouth 2 (two) times daily for 10 days. 01/13/23 01/23/23 Yes Tamora Huneke-Warren, Alda Lea, NP  Accu-Chek FastClix Lancets MISC  05/11/19   [provider]  aspirin EC 81 MG tablet Take 1 tablet (81 mg total) by mouth daily. Patient not taking: Reported on 08/09/2022 03/25/19   Lelon Perla, MD  dapagliflozin propanediol (FARXIGA) 10 MG TABS tablet TAKE 1 TABLET BY MOUTH  DAILY BEFORE BREAKFAST 08/09/22   Kuneff, Renee A, DO  diazepam (VALIUM) 5 MG tablet Take 1 tablet (5 mg total) by mouth every 6 (six) hours as needed for anxiety. 12/09/22   Kuneff, Renee A, DO  glucose blood (ONETOUCH ULTRA) test strip CHECK BLOOD SUGAR AS DIRECTED 10/06/22   Kuneff, Renee A, DO  GNP ULTICARE PEN NEEDLES 31G X  5 MM MISC AS DIRECTED ONCE DAILY 04/29/22   Kuneff, Renee A, DO  insulin glargine (LANTUS SOLOSTAR) 100 UNIT/ML Solostar Pen Inject 65 Units into the skin daily. 12/09/22 01/08/23  Kuneff, Renee A, DO  JANUMET XR [id number] MG TB24 Take 1 tablet by mouth daily. 12/09/22   Kuneff, Renee A, DO  lisinopril (ZESTRIL) 20 MG tablet Take 1 tablet (20 mg total) by mouth daily. 12/09/22   Kuneff, Renee A, DO  metoprolol succinate (TOPROL-XL) 25 MG 24 hr tablet Take 1 tablet (25 mg total) by mouth daily. 12/09/22   Kuneff, Renee A, DO  naproxen (NAPROSYN) 500 MG tablet Every 12 hours with food as needed only 12/09/22    Kuneff, Renee A, DO  nitroGLYCERIN (NITROSTAT) 0.4 MG SL tablet DISSOLVE 1 TABLET UNDER THE TONGUE EVERY 5 MINUTES AS  NEEDED FOR CHEST PAIN. MAX  OF 3 TABLETS IN 15 MINUTES. CALL 911 IF PAIN PERSISTS. 10/08/20   Lelon Perla, MD  omega-3 acid ethyl esters (LOVAZA) 1 g capsule Take 1 capsule (1 g total) by mouth daily. 12/09/22   Kuneff, Renee A, DO  rosuvastatin (CRESTOR) 40 MG tablet Take 1 tablet (40 mg total) by mouth at bedtime. 08/09/22   Kuneff, Renee A, DO  traZODone (DESYREL) 100 MG tablet Take 1 tablet (100 mg total) by mouth at bedtime as needed for sleep. 12/09/22   Ma Hillock, DO    Family History Family History  Problem Relation Age of Onset   Heart attack Mother    Heart disease Mother    Diabetes Mother    Arthritis Mother    Hyperlipidemia Mother    Hypertension Mother    Breast cancer Mother    Heart attack Father    Heart disease Father    Alcohol abuse Father    Early death Father    Arthritis Sister    Heart disease Sister    Hyperlipidemia Sister    Asthma Sister    COPD Sister    Diabetes Sister    Alcohol abuse Sister     Social History Social History   Tobacco Use   Smoking status: Never   Smokeless tobacco: Never  Vaping Use   Vaping Use: Never used  Substance Use Topics   Alcohol use: Yes    Alcohol/week: 0.0 standard drinks of alcohol    Comment: Occasional   Drug use: Not Currently    Types: Cocaine, Marijuana    Comment: Former     Allergies   Gabapentin, Hydrocodone, Nsaids, Prednisone, Tramadol, and Penicillins   Review of Systems Review of Systems Per HPI  Physical Exam Triage Vital Signs ED Triage Vitals  Enc Vitals Group     BP 01/13/23 1550 119/70     Pulse Rate 01/13/23 1550 67     Resp 01/13/23 1550 20     Temp 01/13/23 1550 98.9 F (37.2 C)     Temp Source 01/13/23 1550 Oral     SpO2 01/13/23 1550 94 %     Weight --      Height --      Head Circumference --      Peak Flow --      Pain Score 01/13/23  1554 8     Pain Loc --      Pain Edu? --      Excl. in [place]? --    No data found.  Updated Vital Signs BP 119/70 (BP Location: Right Arm)   Pulse 67  Temp 98.9 F (37.2 C) (Oral)   Resp 20   SpO2 94%   Visual Acuity Right Eye Distance:   Left Eye Distance:   Bilateral Distance:    Right Eye Near:   Left Eye Near:    Bilateral Near:     Physical Exam Vitals and nursing note reviewed.  Constitutional:      General: He is not in acute distress.    Appearance: Normal appearance.  HENT:     Head: Normocephalic.  Eyes:     Extraocular Movements: Extraocular movements intact.     Pupils: Pupils are equal, round, and reactive to light.  Cardiovascular:     Rate and Rhythm: Normal rate and regular rhythm.     Pulses: Normal pulses.     Heart sounds: Normal heart sounds.  Pulmonary:     Effort: Pulmonary effort is normal. No respiratory distress.     Breath sounds: Normal breath sounds. No stridor. No wheezing, rhonchi or rales.  Abdominal:     General: Bowel sounds are normal.     Palpations: Abdomen is soft.     Tenderness: There is no abdominal tenderness.  Musculoskeletal:     Cervical back: Normal range of motion.  Skin:    General: Skin is warm and dry.     Findings: Abscess present.     Comments: 3.5 cm firm induration noted to the left neck at the hairline. Area is erythematous, warm and tender to palpation. No fluctuance, oozing or drainage present   Neurological:     General: No focal deficit present.     Mental Status: He is alert and oriented to person, place, and time.  Psychiatric:        Mood and Affect: Mood normal.        Behavior: Behavior normal.      UC Treatments / Results  Labs (all labs ordered are listed, but only abnormal results are displayed) Labs Reviewed - No data to display  EKG   Radiology No results found.  Procedures Procedures (including critical care time)  Medications Ordered in UC Medications - No data to  display  Initial Impression / Assessment and Plan / UC Course  I have reviewed the triage vital signs and the nursing notes.  Pertinent labs & imaging results that were available during my care of the patient were reviewed by me and considered in my medical decision making (see chart for details).  The patient is well-appearing, he is no acute distress, vital signs are stable.   Patient with 3-1/2 cm induration that is erythematous, warm to palpation, and firm with tenderness to the left neck.  Symptoms are consistent with cellulitis of the neck.  Will start patient on doxycycline 100 mg twice daily for the next 10 days.  Supportive care recommendations were provided to the patient to include warm compresses, cleansing the area with antibacterial soap, and over-the-counter analgesics for pain or discomfort.  Discussed with patient when follow-up may be indicated versus going to the emergency department.  Patient is in agreement with this plan of care and verbalizes understanding.  All questions were answered.  Patient stable for discharge. Final Clinical Impressions(s) / UC Diagnoses   Final diagnoses:  Cellulitis of neck     Discharge Instructions      Take medication as prescribed. Warm compresses to the affected area 3-4 times daily. Clean the area at least twice daily with Dial Gold bar soap or another type of antibacterial soap. Keep the  area covered while it is draining. Go to the emergency department if you develop fever, chills, generalized fatigue, nausea, vomiting, or if the area of redness spreads into the hairline or down the neck, or if you have foul-smelling drainage.  If symptoms fail to improve, please follow-up with your primary care physician for further evaluation.     ED Prescriptions     Medication Sig Dispense Auth. Provider   doxycycline (VIBRA-TABS) 100 MG tablet Take 1 tablet (100 mg total) by mouth 2 (two) times daily for 10 days. 20 tablet Jazzie Trampe-Warren,  Alda Lea, NP      PDMP not reviewed this encounter.   Tish Men, NP 01/13/23 1616

## 2023-01-18 ENCOUNTER — Encounter: Payer: Self-pay | Admitting: Family Medicine

## 2023-01-18 ENCOUNTER — Ambulatory Visit (INDEPENDENT_AMBULATORY_CARE_PROVIDER_SITE_OTHER): Payer: 59 | Admitting: Family Medicine

## 2023-01-18 VITALS — BP 94/48 | HR 52 | Temp 97.7°F | Wt 211.6 lb

## 2023-01-18 DIAGNOSIS — L03221 Cellulitis of neck: Secondary | ICD-10-CM | POA: Diagnosis not present

## 2023-01-18 DIAGNOSIS — L720 Epidermal cyst: Secondary | ICD-10-CM | POA: Diagnosis not present

## 2023-01-18 MED ORDER — GUAIFENESIN-DM 100-10 MG/5ML PO SYRP: 10.0000 mL | ORAL_SOLUTION | ORAL | 0 refills | Status: DC | PRN

## 2023-01-18 MED ORDER — MUPIROCIN 2 % EX OINT: 1.0000 | TOPICAL_OINTMENT | Freq: Two times a day (BID) | CUTANEOUS | 0 refills | Status: DC

## 2023-01-18 NOTE — Progress Notes (Signed)
[name] , 08-May-1956, 67 y.o., male MRN: [id number] Patient Care Team    Relationship Specialty Notifications Start End  Ma Hillock, DO PCP - General Family Medicine  11/29/18   Lelon Perla, MD PCP - Cardiology Cardiology  08/07/20   Bernarda Caffey, MD Consulting Physician Ophthalmology  01/09/19   Jon Billings, [facility]   01/03/23     Chief Complaint  Patient presents with   Cyst    Back of neck left side. Noticed only a week ago     Subjective: [name] is a 67 y.o.  Pt presents for an OV with complaints of neck cyst. He was seen in the ED 01/13/2023 and diagnosed with cellulitis and provided with doxy BID x 10d.  He he reports the cyst is improving but is still rather large.  He states it did rupture and drain quite a bit of purulent drainage.  He denies any fevers or chills.     12/09/2022    1:43 PM 12/05/2022    9:54 AM 08/19/2022   12:57 PM 11/10/2021    2:10 PM 08/18/2021    8:38 AM  Depression screen PHQ 2/9  Decreased Interest 2 1 0 0 1  Down, Depressed, Hopeless 0 0 0 0 1  PHQ - 2 Score 2 1 0 0 2  Altered sleeping 0    0  Tired, decreased energy 2    0  Change in appetite 3    1  Feeling bad or failure about yourself  0    1  Trouble concentrating 2    0  Moving slowly or fidgety/restless 0    0  Suicidal thoughts 0    0  PHQ-9 Score 9    4  Difficult doing work/chores     Not difficult at all    Allergies  Allergen Reactions   Gabapentin Other (See Comments)    Paranoia/hallucinations   Hydrocodone Other (See Comments)    PT reports  Confusion when tacking Hydrocodone   Nsaids     History of heart disease, CAD with history of MI, CABG status post stent   Prednisone     Paranoia   Tramadol    Penicillins Other (See Comments)    Childhood unsure if still allgeric   Social History   Social History Narrative   Marital status/children/pets: Single   Education/employment: HS grad, retired-[employer]oor  Energy manager:      -smoke alarm in the home:Yes     - wears seatbelt: Yes      Past Medical History:  Diagnosis Date   Alcohol addiction ([place])    Anxiety    Arthritis    Bulging of lumbar intervertebral disc 2017   MRI: L3-4, L4-5 and L5-S1- facet hypertrophy and formainal stenosis   CAD (coronary artery disease)    Coronary artery disease with hx of myocardial infarct w/o hx of CABG 2004   Depression    Dextrocardia    Diabetes ([place])    Heart attack ([place]) 2004   Stent placement 2008   Hyperlipidemia    Hypertension    Insomnia    Past Surgical History:  Procedure Laterality Date   CORONARY ANGIOPLASTY WITH STENT PLACEMENT  2008   Family History  Problem Relation Age of Onset   Heart attack Mother    Heart disease Mother    Diabetes Mother    Arthritis Mother  Hyperlipidemia Mother    Hypertension Mother    Breast cancer Mother    Heart attack Father    Heart disease Father    Alcohol abuse Father    Early death Father    Arthritis Sister    Heart disease Sister    Hyperlipidemia Sister    Asthma Sister    COPD Sister    Diabetes Sister    Alcohol abuse Sister    Allergies as of 01/18/2023       Reactions   Gabapentin Other (See Comments)   Paranoia/hallucinations   Hydrocodone Other (See Comments)   PT reports  Confusion when tacking Hydrocodone   Nsaids    History of heart disease, CAD with history of MI, CABG status post stent   Prednisone    Paranoia   Tramadol    Penicillins Other (See Comments)   Childhood unsure if still allgeric        Medication List        Accurate as of January 18, 2023 11:59 PM. If you have any questions, ask your nurse or doctor.          Accu-Chek FastClix Lancets Misc   aspirin EC 81 MG tablet Take 1 tablet (81 mg total) by mouth daily.   dapagliflozin propanediol 10 MG Tabs tablet Commonly known as: Farxiga TAKE 1 TABLET BY MOUTH  DAILY BEFORE BREAKFAST   diazepam 5 MG tablet Commonly  known as: VALIUM Take 1 tablet (5 mg total) by mouth every 6 (six) hours as needed for anxiety.   doxycycline 100 MG tablet Commonly known as: VIBRA-TABS Take 1 tablet (100 mg total) by mouth 2 (two) times daily for 10 days.   GNP UltiCare Pen Needles 31G X 5 MM Misc Generic drug: Insulin Pen Needle AS DIRECTED ONCE DAILY   guaiFENesin-dextromethorphan 100-10 MG/5ML syrup Commonly known as: ROBITUSSIN DM Take 10 mLs by mouth every 4 (four) hours as needed for cough. Started by: Howard Pouch, DO   Janumet XR 640 [id number] MG Tb24 Generic drug: SitaGLIPtin-MetFORMIN HCl Take 1 tablet by mouth daily.   Lantus SoloStar 100 UNIT/ML Solostar Pen Generic drug: insulin glargine Inject 65 Units into the skin daily.   lisinopril 20 MG tablet Commonly known as: ZESTRIL Take 1 tablet (20 mg total) by mouth daily.   metoprolol succinate 25 MG 24 hr tablet Commonly known as: TOPROL-XL Take 1 tablet (25 mg total) by mouth daily.   mupirocin ointment 2 % Commonly known as: BACTROBAN Apply 1 Application topically 2 (two) times daily. Apply twice daily for 10 days to abscess Started by: Howard Pouch, DO   naproxen 500 MG tablet Commonly known as: NAPROSYN Every 12 hours with food as needed only   nitroGLYCERIN 0.4 MG SL tablet Commonly known as: NITROSTAT DISSOLVE 1 TABLET UNDER THE TONGUE EVERY 5 MINUTES AS  NEEDED FOR CHEST PAIN. MAX  OF 3 TABLETS IN 15 MINUTES. CALL 911 IF PAIN PERSISTS.   omega-3 acid ethyl esters 1 g capsule Commonly known as: LOVAZA Take 1 capsule (1 g total) by mouth daily.   OneTouch Ultra test strip Generic drug: glucose blood CHECK BLOOD SUGAR AS DIRECTED   rosuvastatin 40 MG tablet Commonly known as: CRESTOR Take 1 tablet (40 mg total) by mouth at bedtime.   traZODone 100 MG tablet Commonly known as: DESYREL Take 1 tablet (100 mg total) by mouth at bedtime as needed for sleep.        All past medical history, surgical history, allergies,  family  history, immunizations andmedications were updated in the EMR today and reviewed under the history and medication portions of their EMR.     ROS Negative, with the exception of above mentioned in HPI   Objective:  BP (!) 94/48   Pulse (!) 52   Temp 97.7 F (36.5 C)   Wt 211 lb 9.6 oz (96 kg)   SpO2 96%   BMI 29.51 kg/m  Body mass index is 29.51 kg/m. Physical Exam Vitals and nursing note reviewed.  Constitutional:      General: He is not in acute distress.    Appearance: Normal appearance. He is not ill-appearing, toxic-appearing or diaphoretic.  HENT:     Head: Normocephalic and atraumatic.  Eyes:     General: No scleral icterus.       Right eye: No discharge.        Left eye: No discharge.     Extraocular Movements: Extraocular movements intact.     Pupils: Pupils are equal, round, and reactive to light.  Skin:    General: Skin is warm.     Coloration: Skin is not jaundiced or pale.     Findings: No rash.     Comments: Left posterior neck: 3.5 cm area erythema and swelling present.  Excoriated abscess present.  No obvious fluctuance remaining.  No drainage.  No bleeding.  Neurological:     Mental Status: He is alert and oriented to person, place, and time. Mental status is at baseline.  Psychiatric:        Mood and Affect: Mood normal.        Behavior: Behavior normal.        Thought Content: Thought content normal.        Judgment: Judgment normal.    No results found. No results found. No results found for this or any previous visit (from the past 24 hour(s)).  Assessment/Plan: [name] is a 67 y.o. male present for OV for  Epidermal cyst of neck versus abscess/cellulitis of neck Certain if original presentation was more consistent with abscess versus infected epidermal cyst.  He does report some improvement and he has antibiotic to finish.   Exam today is positive for significant swelling and redness remaining.  No obvious fluctuance. We discussed  follow-up with surgery, he very well may need to have this surgically removed once it is less infected appearing.  He does have an upcoming surgery for carpal tunnels soon and will need to make sure he needs does not have any signs of infection prior to undergoing surgery for his carpal tunnel.  Patient is in agreement with plan. Finish antibiotics prescribed to completion. Bactroban twice daily also prescribed - Ambulatory referral to General Surgery   Reviewed expectations re: course of current medical issues. Discussed self-management of symptoms. Outlined signs and symptoms indicating need for more acute intervention. Patient verbalized understanding and all questions were answered. Patient received an After-Visit Summary.    Orders Placed This Encounter  Procedures   Ambulatory referral to General Surgery   Meds ordered this encounter  Medications   mupirocin ointment (BACTROBAN) 2 %    Sig: Apply 1 Application topically 2 (two) times daily. Apply twice daily for 10 days to abscess    Dispense:  30 g    Refill:  0    May fill with cream or ointment. (Formulary)   guaiFENesin-dextromethorphan (ROBITUSSIN DM) 100-10 MG/5ML syrup    Sig: Take 10 mLs by mouth every 4 (four)  hours as needed for cough.    Dispense:  118 mL    Refill:  0   Referral Orders         Ambulatory referral to General Surgery       Note is dictated utilizing voice recognition software. Although note has been proof read prior to signing, occasional typographical errors still can be missed. If any questions arise, please do not hesitate to call for verification.   electronically signed by:  Howard Pouch, DO  [place]

## 2023-01-19 ENCOUNTER — Telehealth: Payer: Self-pay

## 2023-01-19 NOTE — Telephone Encounter (Signed)
Patient called about boil on neck.  He saw doctor yesterday about it.  See note in EPIC.  Wants to know what he should do, if anything further.  CTR is planned for 01/30/23.  Please call.  [phone]

## 2023-01-19 NOTE — Telephone Encounter (Signed)
Please see message. °

## 2023-01-20 NOTE — Telephone Encounter (Signed)
I called patient and advised. 

## 2023-01-20 NOTE — Telephone Encounter (Signed)
To you

## 2023-01-26 ENCOUNTER — Encounter: Payer: Self-pay | Admitting: Radiology

## 2023-01-28 DIAGNOSIS — E1169 Type 2 diabetes mellitus with other specified complication: Secondary | ICD-10-CM | POA: Diagnosis not present

## 2023-01-28 DIAGNOSIS — Z794 Long term (current) use of insulin: Secondary | ICD-10-CM | POA: Diagnosis not present

## 2023-01-30 ENCOUNTER — Ambulatory Visit: Payer: Self-pay

## 2023-01-30 ENCOUNTER — Other Ambulatory Visit: Payer: Self-pay | Admitting: Orthopaedic Surgery

## 2023-01-30 DIAGNOSIS — G5601 Carpal tunnel syndrome, right upper limb: Secondary | ICD-10-CM | POA: Diagnosis not present

## 2023-01-30 MED ORDER — OXYCODONE-ACETAMINOPHEN 5-325 MG PO TABS: 1.0000 | ORAL_TABLET | Freq: Four times a day (QID) | ORAL | 0 refills | Status: DC | PRN

## 2023-01-30 NOTE — Patient Instructions (Signed)
Visit Information  Thank you for taking time to visit with me today. Please don't hesitate to contact me if I can be of assistance to you.   Following are the goals we discussed today:   Goals Addressed               This Visit's Progress     Diabetes Management (pt-stated)        Patient Goals/Self Care Activities: -Patient/Caregiver will self-administer medications as prescribed as evidenced by self-report/primary caregiver report  -Patient/Caregiver will attend all scheduled provider appointments as evidenced by clinician review of documented attendance to scheduled appointments and patient/caregiver report -Patient/Caregiver will call provider office for new concerns or questions as evidenced by review of documented incoming telephone call notes and patient report  -check blood sugar at prescribed times -check blood sugar if I feel it is too high or too low -record values and write them down take them to all doctor visits  -limit carbohydrates   Interventions Today    Flowsheet Row Most Recent Value  Chronic Disease   Chronic disease during today's visit Diabetes  General Interventions   General Interventions Discussed/Reviewed General Interventions Discussed  Exercise Interventions   Exercise Discussed/Reviewed Exercise Discussed  Education Interventions   Education Provided Provided Education  Provided Verbal Education On When to see the doctor, Nutrition  Nutrition Interventions   Nutrition Discussed/Reviewed Nutrition Discussed, Decreasing sugar intake  [Carbohydrates limiting]             Our next appointment is by telephone on 02/27/23 at 1100  Please call the care guide team at [phone] if you need to cancel or reschedule your appointment.   If you are experiencing a Mental Health or [place] or need someone to talk to, please call the Suicide and Crisis Lifeline: 988   Patient verbalizes understanding of instructions and care plan  provided today and agrees to view in [place]. Active MyChart status and patient understanding of how to access instructions and care plan via MyChart confirmed with patient.     The patient has been provided with contact information for the care management team and has been advised to call with any health related questions or concerns.   Jone Baseman, RN, MSN [facility] Care Management Coordinator Direct Line [phone]

## 2023-01-30 NOTE — Patient Outreach (Signed)
  Care Coordination   Follow Up Visit Note   01/30/2023 Name: [name] MRN: [id number] DOB: 04-May-1956  [name] is a 67 y.o. year old male who sees Kuneff, Renee A, DO for primary care. I spoke with  [name] by phone today.  What matters to the patients health and wellness today?  Right hand carpal tunnel surgery completed. Patient has pain medications and will follow up with surgeon next week.      Goals Addressed               This Visit's Progress     Diabetes Management (pt-stated)        Patient Goals/Self Care Activities: -Patient/Caregiver will self-administer medications as prescribed as evidenced by self-report/primary caregiver report  -Patient/Caregiver will attend all scheduled provider appointments as evidenced by clinician review of documented attendance to scheduled appointments and patient/caregiver report -Patient/Caregiver will call provider office for new concerns or questions as evidenced by review of documented incoming telephone call notes and patient report  -check blood sugar at prescribed times -check blood sugar if I feel it is too high or too low -record values and write them down take them to all doctor visits  -limit carbohydrates   Interventions Today    Flowsheet Row Most Recent Value  Chronic Disease   Chronic disease during today's visit Diabetes  General Interventions   General Interventions Discussed/Reviewed General Interventions Discussed  Exercise Interventions   Exercise Discussed/Reviewed Exercise Discussed  Education Interventions   Education Provided Provided Education  Provided Verbal Education On When to see the doctor, Nutrition  Nutrition Interventions   Nutrition Discussed/Reviewed Nutrition Discussed, Decreasing sugar intake  [Carbohydrates limiting]             SDOH assessments and interventions completed:  Yes     Care Coordination Interventions:  Yes, provided   Follow up plan: Follow up call  scheduled for April    Encounter Outcome:  Pt. Visit Completed   Jone Baseman, RN, MSN [facility] Care Management Coordinator Direct Line [phone]

## 2023-02-01 ENCOUNTER — Telehealth: Payer: Self-pay | Admitting: Orthopaedic Surgery

## 2023-02-01 ENCOUNTER — Other Ambulatory Visit: Payer: Self-pay | Admitting: Orthopaedic Surgery

## 2023-02-01 NOTE — Telephone Encounter (Signed)
noted 

## 2023-02-01 NOTE — Telephone Encounter (Signed)
Please advise. Patient is status post right carpal tunnel release on 01/30/2023 and was prescribed Oxycodone.

## 2023-02-01 NOTE — Telephone Encounter (Signed)
Patient advised he is still having pain and medication is not working..please advise

## 2023-02-02 ENCOUNTER — Ambulatory Visit: Payer: 59 | Admitting: General Surgery

## 2023-02-07 ENCOUNTER — Ambulatory Visit (INDEPENDENT_AMBULATORY_CARE_PROVIDER_SITE_OTHER): Payer: 59 | Admitting: Orthopaedic Surgery

## 2023-02-07 VITALS — BP 123/70 | HR 62 | Ht 71.0 in | Wt 211.0 lb

## 2023-02-07 DIAGNOSIS — G5601 Carpal tunnel syndrome, right upper limb: Secondary | ICD-10-CM

## 2023-02-07 NOTE — Progress Notes (Signed)
1 week post right carpal tunnel release.  Incision looks good stockinette and wrist splint applied.  Return 1 week for suture removal.

## 2023-02-15 ENCOUNTER — Encounter: Payer: 59 | Admitting: Orthopaedic Surgery

## 2023-02-27 ENCOUNTER — Ambulatory Visit: Payer: Self-pay

## 2023-02-27 DIAGNOSIS — E1169 Type 2 diabetes mellitus with other specified complication: Secondary | ICD-10-CM | POA: Diagnosis not present

## 2023-02-27 DIAGNOSIS — Z794 Long term (current) use of insulin: Secondary | ICD-10-CM | POA: Diagnosis not present

## 2023-02-27 NOTE — Patient Outreach (Signed)
  Care Coordination   Follow Up Visit Note   02/27/2023 Name: [name] MRN: [id number] DOB: November 22, 1956  [name] is a 67 y.o. year old male who sees Kuneff, Renee A, DO for primary care. I spoke with  Lisabeth Pick Trettin by phone today.  What matters to the patients health and wellness today?  Diabetes management   Goals Addressed               This Visit's Progress     Diabetes Management (pt-stated)        Patient Goals/Self Care Activities: -Patient/Caregiver will self-administer medications as prescribed as evidenced by self-report/primary caregiver report  -Patient/Caregiver will attend all scheduled provider appointments as evidenced by clinician review of documented attendance to scheduled appointments and patient/caregiver report -Patient/Caregiver will call provider office for new concerns or questions as evidenced by review of documented incoming telephone call notes and patient report  -check blood sugar at prescribed times -check blood sugar if I feel it is too high or too low -record values and write them down take them to all doctor visits  -limit carbohydrates  -Blood sugars running 140-150 range.  Discussed continued diabetes management.  Interventions Today    Flowsheet Row Most Recent Value  Chronic Disease   Chronic disease during today's visit Diabetes  General Interventions   General Interventions Discussed/Reviewed General Interventions Reviewed  Exercise Interventions   Exercise Discussed/Reviewed Exercise Reviewed  Education Interventions   Education Provided Provided Education  Provided Verbal Education On Nutrition  Nutrition Interventions   Nutrition Discussed/Reviewed Adding fruits and vegetables, Carbohydrate meal planning, Decreasing sugar intake             SDOH assessments and interventions completed:  Yes     Care Coordination Interventions:  Yes, provided   Follow up plan: Follow up call scheduled for June    Encounter  Outcome:  Pt. Visit Completed   Jone Baseman, RN, MSN [facility] Care Management Coordinator Direct Line [phone]

## 2023-02-27 NOTE — Patient Instructions (Signed)
Visit Information  Thank you for taking time to visit with me today. Please don't hesitate to contact me if I can be of assistance to you.   Following are the goals we discussed today:   Goals Addressed               This Visit's Progress     Diabetes Management (pt-stated)        Patient Goals/Self Care Activities: -Patient/Caregiver will self-administer medications as prescribed as evidenced by self-report/primary caregiver report  -Patient/Caregiver will attend all scheduled provider appointments as evidenced by clinician review of documented attendance to scheduled appointments and patient/caregiver report -Patient/Caregiver will call provider office for new concerns or questions as evidenced by review of documented incoming telephone call notes and patient report  -check blood sugar at prescribed times -check blood sugar if I feel it is too high or too low -record values and write them down take them to all doctor visits  -limit carbohydrates  -Blood sugars running 140-150 range.  Discussed continued diabetes management.  Interventions Today    Flowsheet Row Most Recent Value  Chronic Disease   Chronic disease during today's visit Diabetes  General Interventions   General Interventions Discussed/Reviewed General Interventions Reviewed  Exercise Interventions   Exercise Discussed/Reviewed Exercise Reviewed  Education Interventions   Education Provided Provided Education  Provided Verbal Education On Nutrition  Nutrition Interventions   Nutrition Discussed/Reviewed Adding fruits and vegetables, Carbohydrate meal planning, Decreasing sugar intake             Our next appointment is by telephone on 05/01/23 at 1030  Please call the care guide team at [phone] if you need to cancel or reschedule your appointment.   If you are experiencing a Mental Health or [place] or need someone to talk to, please call the Suicide and Crisis Lifeline: 988    Patient verbalizes understanding of instructions and care plan provided today and agrees to view in [place]. Active MyChart status and patient understanding of how to access instructions and care plan via MyChart confirmed with patient.     The patient has been provided with contact information for the care management team and has been advised to call with any health related questions or concerns.   Jone Baseman, RN, MSN [facility] Care Management Coordinator Direct Line [phone]

## 2023-03-24 ENCOUNTER — Ambulatory Visit (INDEPENDENT_AMBULATORY_CARE_PROVIDER_SITE_OTHER): Payer: 59 | Admitting: Family Medicine

## 2023-03-24 VITALS — BP 116/66 | HR 49 | Temp 97.8°F | Wt 206.0 lb

## 2023-03-24 DIAGNOSIS — I77810 Thoracic aortic ectasia: Secondary | ICD-10-CM | POA: Diagnosis not present

## 2023-03-24 DIAGNOSIS — I350 Nonrheumatic aortic (valve) stenosis: Secondary | ICD-10-CM

## 2023-03-24 DIAGNOSIS — Z7984 Long term (current) use of oral hypoglycemic drugs: Secondary | ICD-10-CM

## 2023-03-24 DIAGNOSIS — M47817 Spondylosis without myelopathy or radiculopathy, lumbosacral region: Secondary | ICD-10-CM

## 2023-03-24 DIAGNOSIS — E1142 Type 2 diabetes mellitus with diabetic polyneuropathy: Secondary | ICD-10-CM

## 2023-03-24 DIAGNOSIS — I517 Cardiomegaly: Secondary | ICD-10-CM

## 2023-03-24 DIAGNOSIS — F5101 Primary insomnia: Secondary | ICD-10-CM | POA: Diagnosis not present

## 2023-03-24 DIAGNOSIS — M48061 Spinal stenosis, lumbar region without neurogenic claudication: Secondary | ICD-10-CM | POA: Diagnosis not present

## 2023-03-24 DIAGNOSIS — I2510 Atherosclerotic heart disease of native coronary artery without angina pectoris: Secondary | ICD-10-CM | POA: Diagnosis not present

## 2023-03-24 DIAGNOSIS — I10 Essential (primary) hypertension: Secondary | ICD-10-CM

## 2023-03-24 DIAGNOSIS — E113293 Type 2 diabetes mellitus with mild nonproliferative diabetic retinopathy without macular edema, bilateral: Secondary | ICD-10-CM

## 2023-03-24 DIAGNOSIS — I252 Old myocardial infarction: Secondary | ICD-10-CM

## 2023-03-24 DIAGNOSIS — E785 Hyperlipidemia, unspecified: Secondary | ICD-10-CM

## 2023-03-24 DIAGNOSIS — Z794 Long term (current) use of insulin: Secondary | ICD-10-CM | POA: Diagnosis not present

## 2023-03-24 DIAGNOSIS — F411 Generalized anxiety disorder: Secondary | ICD-10-CM

## 2023-03-24 DIAGNOSIS — E1169 Type 2 diabetes mellitus with other specified complication: Secondary | ICD-10-CM

## 2023-03-24 DIAGNOSIS — E559 Vitamin D deficiency, unspecified: Secondary | ICD-10-CM

## 2023-03-24 DIAGNOSIS — Z955 Presence of coronary angioplasty implant and graft: Secondary | ICD-10-CM

## 2023-03-24 LAB — POCT GLYCOSYLATED HEMOGLOBIN (HGB A1C)
HbA1c POC (<> result, manual entry): 6.8 % (ref 4.0–5.6)
HbA1c, POC (controlled diabetic range): 6.8 % (ref 0.0–7.0)
HbA1c, POC (prediabetic range): 6.8 % — AB (ref 5.7–6.4)
Hemoglobin A1C: 6.8 % — AB (ref 4.0–5.6)

## 2023-03-24 MED ORDER — LANTUS SOLOSTAR 100 UNIT/ML ~~LOC~~ SOPN: 65.0000 [IU] | PEN_INJECTOR | Freq: Every day | SUBCUTANEOUS | 1 refills | Status: DC

## 2023-03-24 MED ORDER — METOPROLOL TARTRATE 25 MG PO TABS: 12.5000 mg | ORAL_TABLET | Freq: Two times a day (BID) | ORAL | 1 refills | Status: DC

## 2023-03-24 MED ORDER — ROSUVASTATIN CALCIUM 40 MG PO TABS: 40.0000 mg | ORAL_TABLET | Freq: Every day | ORAL | 3 refills | Status: DC

## 2023-03-24 MED ORDER — OMEGA-3-ACID ETHYL ESTERS 1 G PO CAPS: 1.0000 | ORAL_CAPSULE | Freq: Every day | ORAL | 2 refills | Status: DC

## 2023-03-24 MED ORDER — TRAZODONE HCL 100 MG PO TABS: 100.0000 mg | ORAL_TABLET | Freq: Every evening | ORAL | 1 refills | Status: DC | PRN

## 2023-03-24 MED ORDER — DIAZEPAM 5 MG PO TABS: 5.0000 mg | ORAL_TABLET | Freq: Four times a day (QID) | ORAL | 5 refills | Status: DC | PRN

## 2023-03-24 MED ORDER — NAPROXEN 500 MG PO TABS: ORAL_TABLET | ORAL | 1 refills | Status: DC

## 2023-03-24 MED ORDER — JANUMET XR 100-1000 MG PO TB24: 1.0000 | ORAL_TABLET | Freq: Every day | ORAL | 1 refills | Status: DC

## 2023-03-24 MED ORDER — METOPROLOL SUCCINATE ER 25 MG PO TB24: 25.0000 mg | ORAL_TABLET | Freq: Every day | ORAL | 1 refills | Status: DC

## 2023-03-24 MED ORDER — LISINOPRIL 20 MG PO TABS: 20.0000 mg | ORAL_TABLET | Freq: Every day | ORAL | 1 refills | Status: DC

## 2023-03-24 MED ORDER — DAPAGLIFLOZIN PROPANEDIOL 10 MG PO TABS: ORAL_TABLET | ORAL | 1 refills | Status: DC

## 2023-03-24 NOTE — Progress Notes (Signed)
[name] , Apr 18, 1956, 67 y.o., male MRN: [id number] Patient Care Team    Relationship Specialty Notifications Start End  Natalia Leatherwood, DO PCP - General Family Medicine  11/29/18   Lewayne Bunting, MD PCP - Cardiology Cardiology  08/07/20   Rennis Chris, MD Consulting Physician Ophthalmology  01/09/19   Fleeta Emmer, RN [facility] Care Management   01/03/23     Chief Complaint  Patient presents with   Diabetes    [facility]     Subjective: Pt presents for an OV for  Diabetes with complications/overweight:  Patient reports he has been a diabetic since 1998.   He reports with compliance w/  Janumet/Metformin 1000-100 mg daily, Farxiga 10 mg daily and Lantus 65 units daily.  Patient denies dizziness, hyperglycemic or hypoglycemic events. Patient denies numbness, tingling in the extremities or nonhealing wounds of feet.   He reports he is not exercising and he is not watching his diet well at this current time.  Depression/anxiety/insomnia: Patient reports he is feeling well with Valium 4 times daily and trazodone 100 mg nightly.    He had been on BuSpar as well which he originally thought was helpful, but then he felt that it was not as helpful.   [place] controlled substance database reviewed today Original note: He states he has a rather routine day.  He does not have much social contact in a day.  He lives by himself.  He reports that he always feels like he "stays on edge.  "He reports when he wakes up he is already feeling on edge and he will take a Valium at that time.  He then has breakfast and goes on a 4 mile walk daily.  For lunch he starts to feel his anxiety sudden and he becomes panicked.  He states when he feels this way he will break out in a sweat, become very fearful and scared.  He states he commonly fears there is someone in the house with a gun.  He goes to his bedroom gets in his bed. He states the fear is so intense and he just cannot  control it.  He denies any known trauma or event experienced in his life that would create such a fear.  He states he did go see a counselor/psychiatrist at [facility] in the past 6 months, and he did not feel that was helpful for him.  Denies any SI or HI.  Hypertension/CAD/S/p Stent/prior MI:  Pt has a significant cardiac history of "anterior MI in 2004 with stents was LAD and then perhaps stents to branch vessel 2008 (by DR. Renaldo-cardio note 2015)" Patient reports compliance with lisinopril 20 mg daily and metoprolol 25 mg daily, Lovaza, and Crestor.   He is not compliant with baby aspirin 81 mg daily.  Plavix was DC'd by cards.  His statin was increased by cardiology to Crestor 40 mg daily.  Patient denies chest pain, shortness of breath, dizziness or lower extremity edema.  Diet: Low-sodium Exercise: Routine exercise daily RF: Hypertension, hyperlipidemia, diabetes, overweight, family history, CAD, MI   Neck pain/back pain: pt reports about the same.  The naproxen twice daily as needed is helpful. Prior note: Patient has had recurrent posterior neck pain and headache.  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  He states he found an old prescription of naproxen and restarted it the last time he had symptoms.  He states it worked well for him.  He is wondering if we could get refills for him today.  In the past he has stated he could not tolerate NSAIDs.     Erectile dysfunction: (Documentation only) patient would like to start a medication for erectile dysfunction.  He states he seen a urologist many years ago and does not desire to see a urologist again.  Patient has a significant medical history of blood pressure with systolic less than 100 and history of CAD and MI.  Echo 06/08/2021: EF 50-55%.  LVH.  Moderate calcification of aortic valve.  echo 03/06/2020:  1. Technically difficult study. Left ventricular ejection fraction, by  estimation,  is 45 to 50%. The left ventricle has grossly mildly decreased  function. Image quality is not sufficient to assess for regional wall  motion abnormalities. Left ventricular   diastolic parameters are indeterminate.   2. Right ventricle is poorly visualized. Grossly normal size and systolic  function. Tricuspid regurgitation signal is inadequate for assessing PA  pressure.   3. The mitral valve is normal in structure. No evidence of mitral valve  regurgitation.   4. Aortic dilatation noted. There is mild dilatation of the ascending  aorta measuring 37 mm.   5. The aortic valve is abnormal. Moderately calcified. Aortic valve  regurgitation is not visualized. Mild aortic valve stenosis. Vmax 2.2 m/s,  MG 10 mmHg, AVA 1.7 cm^2, DI 0.4      03/24/2023    1:35 PM 12/09/2022    1:43 PM 12/05/2022    9:54 AM 08/19/2022   12:57 PM 11/10/2021    2:10 PM  Depression screen PHQ 2/9  Decreased Interest 0 2 1 0 0  Down, Depressed, Hopeless 0 0 0 0 0  PHQ - 2 Score 0 2 1 0 0  Altered sleeping  0     Tired, decreased energy  2     Change in appetite  3     Feeling bad or failure about yourself   0     Trouble concentrating  2     Moving slowly or fidgety/restless  0     Suicidal thoughts  0     PHQ-9 Score  9       Allergies  Allergen Reactions   Gabapentin Other (See Comments)    Paranoia/hallucinations   Hydrocodone Other (See Comments)    PT reports  Confusion when tacking Hydrocodone   Nsaids     History of heart disease, CAD with history of MI, CABG status post stent   Prednisone     Paranoia   Tramadol    Penicillins Other (See Comments)    Childhood unsure if still allgeric   Social History   Social History Narrative   Marital status/children/pets: Single   Education/employment: HS grad, 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:      -smoke alarm in the home:Yes     - wears seatbelt: Yes      Past Medical History:  Diagnosis Date   Alcohol addiction (HCC)    Anxiety     Arthritis    Bulging of lumbar intervertebral disc 2017   MRI: L3-4, L4-5 and L5-S1- facet hypertrophy and formainal stenosis   CAD (coronary artery disease)    Coronary artery disease with hx of myocardial infarct w/o hx of CABG 2004   Depression    Dextrocardia    Diabetes (HCC)    Heart attack (HCC) 2004   Stent placement 2008   Hyperlipidemia  Hypertension    Insomnia    Past Surgical History:  Procedure Laterality Date   CORONARY ANGIOPLASTY WITH STENT PLACEMENT  2008   Family History  Problem Relation Age of Onset   Heart attack Mother    Heart disease Mother    Diabetes Mother    Arthritis Mother    Hyperlipidemia Mother    Hypertension Mother    Breast cancer Mother    Heart attack Father    Heart disease Father    Alcohol abuse Father    Early death Father    Arthritis Sister    Heart disease Sister    Hyperlipidemia Sister    Asthma Sister    COPD Sister    Diabetes Sister    Alcohol abuse Sister    Allergies as of 03/24/2023       Reactions   Gabapentin Other (See Comments)   Paranoia/hallucinations   Hydrocodone Other (See Comments)   PT reports  Confusion when tacking Hydrocodone   Nsaids    History of heart disease, CAD with history of MI, CABG status post stent   Prednisone    Paranoia   Tramadol    Penicillins Other (See Comments)   Childhood unsure if still allgeric        Medication List        Accurate as of March 24, 2023  2:03 PM. If you have any questions, ask your nurse or doctor.          STOP taking these medications    guaiFENesin-dextromethorphan 100-10 MG/5ML syrup Commonly known as: ROBITUSSIN DM   metoprolol succinate 25 MG 24 hr tablet Commonly known as: TOPROL-XL   mupirocin ointment 2 % Commonly known as: BACTROBAN   oxyCODONE-acetaminophen 5-325 MG tablet Commonly known as: PERCOCET/ROXICET       TAKE these medications    Accu-Chek FastClix Lancets Misc   aspirin EC 81 MG tablet Take 1 tablet  (81 mg total) by mouth daily.   dapagliflozin propanediol 10 MG Tabs tablet Commonly known as: Farxiga TAKE 1 TABLET BY MOUTH  DAILY BEFORE BREAKFAST   diazepam 5 MG tablet Commonly known as: VALIUM Take 1 tablet (5 mg total) by mouth every 6 (six) hours as needed for anxiety.   GNP UltiCare Pen Needles 31G X 5 MM Misc Generic drug: Insulin Pen Needle AS DIRECTED ONCE DAILY   Janumet XR [phone] MG Tb24 Generic drug: SitaGLIPtin-MetFORMIN HCl Take 1 tablet by mouth daily.   Lantus SoloStar 100 UNIT/ML Solostar Pen Generic drug: insulin glargine Inject 65 Units into the skin daily.   lisinopril 20 MG tablet Commonly known as: ZESTRIL Take 1 tablet (20 mg total) by mouth daily.   metoprolol tartrate 25 MG tablet Commonly known as: LOPRESSOR Take 0.5 tablets (12.5 mg total) by mouth 2 (two) times daily.   naproxen 500 MG tablet Commonly known as: NAPROSYN Every 12 hours with food as needed only   nitroGLYCERIN 0.4 MG SL tablet Commonly known as: NITROSTAT DISSOLVE 1 TABLET UNDER THE TONGUE EVERY 5 MINUTES AS  NEEDED FOR CHEST PAIN. MAX  OF 3 TABLETS IN 15 MINUTES. CALL 911 IF PAIN PERSISTS.   omega-3 acid ethyl esters 1 g capsule Commonly known as: LOVAZA Take 1 capsule (1 g total) by mouth daily.   OneTouch Ultra test strip Generic drug: glucose blood CHECK BLOOD SUGAR AS DIRECTED   rosuvastatin 40 MG tablet Commonly known as: CRESTOR Take 1 tablet (40 mg total) by mouth at bedtime.  traZODone 100 MG tablet Commonly known as: DESYREL Take 1 tablet (100 mg total) by mouth at bedtime as needed for sleep.        All past medical history, surgical history, allergies, family history, immunizations andmedications were updated in the EMR today and reviewed under the history and medication portions of their EMR.     ROS: Negative, with the exception of above mentioned in HPI   Objective:  BP 116/66   Pulse (!) 49   Temp 97.8 F (36.6 C)   Wt 206 lb (93.4 kg)    SpO2 96%   BMI 28.73 kg/m  Body mass index is 28.73 kg/m. Physical Exam Vitals and nursing note reviewed.  Constitutional:      General: He is not in acute distress.    Appearance: Normal appearance. He is not ill-appearing, toxic-appearing or diaphoretic.  HENT:     Head: Normocephalic and atraumatic.  Eyes:     General: No scleral icterus.       Right eye: No discharge.        Left eye: No discharge.     Extraocular Movements: Extraocular movements intact.     Pupils: Pupils are equal, round, and reactive to light.  Cardiovascular:     Rate and Rhythm: Normal rate and regular rhythm.     Heart sounds: Murmur heard.  Pulmonary:     Effort: Pulmonary effort is normal. No respiratory distress.     Breath sounds: Normal breath sounds. No wheezing, rhonchi or rales.  Musculoskeletal:     Right lower leg: No edema.     Left lower leg: No edema.  Skin:    General: Skin is warm.     Findings: No rash.  Neurological:     Mental Status: He is alert and oriented to person, place, and time. Mental status is at baseline.  Psychiatric:        Mood and Affect: Mood normal.        Behavior: Behavior normal.        Thought Content: Thought content normal.        Judgment: Judgment normal.    Diabetic Foot Exam - Simple   No data filed     No results found. No results found. Results for orders placed or performed in visit on 03/24/23 (from the past 24 hour(s))  POCT HgB A1C     Status: Abnormal   Collection Time: 03/24/23  1:53 PM  Result Value Ref Range   Hemoglobin A1C 6.8 (A) 4.0 - 5.6 %   HbA1c POC (<> result, manual entry) 6.8 4.0 - 5.6 %   HbA1c, POC (prediabetic range) 6.8 (A) 5.7 - 6.4 %   HbA1c, POC (controlled diabetic range) 6.8 0.0 - 7.0 %       Assessment/Plan: [name] is a 67 y.o. male present for OV for  Dyslipidemia associated with type 2 diabetes mellitus (HCC)/diabetic peripheral neuropathy/mild nonproliferative diabetic retinopathy of both  eyes/overweight:  IMPROVED -Continue janumet [phone] daily -Continue farixga 10 mg daily -continue Lantus 65 units   PNA series: Completed after 63 Flu shot: UTD 2023 Foot exam: 08/09/2022 Eye exam:  Completed UTD 07/13/2022 A1c:10.4>>>9.4>>  9.7>>11.8>> 8.5 >>> 6.5 >>>7.1>7.6>7.7>8.7> 8.8 on 04/20/2021>8.3 > 8.7 > 7.4 > 7.6> 8.2 > 6.8 today  Anxiety/depression/insomnia: Stable Has a history of significant depression with anxiety and paranoid features. Continue Valium 4 times daily Continue trazodone to 100 mg daily Meds tried in the past: BuSpar patient reports it was  not effective.  Paxil patient reports made him more anxious. -  [place] controlled substance database reviewed today  Patient aware he needs a face-to-face appointment in order for any refills to be processed -Contract signed -Offered referral to counseling or psychiatry; he declined.    Hypertension/CAD/S/p Stent/prior MI/hypercholesteremia/aortic stenosis/aortic dilatation/LVH:  Pt has a significant cardiac history of "anterior MI in 2004 with stents was LAD and then stents to a branch vessel 2008 (by DR. Renaldo- cardio note 2015)" Continue lisinopril to 20 mg.  Continue metoprolol 25 mg daily -Continue aspirin 81 mg daily.  Patient has been counseled on baby aspirin use benefits.  Cardiology discontinued Plavix  Continue Crestor 40 mg daily Diet: Low-sodium Exercise: Routine exercise daily - now established with cardiology as well for routine follow up.   Memory changes/vitamin D deficiency: He has been taking vitamin D supplement OTC, he is not certain the exact amount he is taking. He has not noticed any changes in his memory. He is on a benzodiazepine and trazodone which could be contributing to his memory decline, especially when he is consuming alcohol.  This was discussed with him today. He was strongly encouraged not to consume alcohol.  If he is expecting could to consume alcohol he is to avoid  taking the Valium.  Reviewed expectations re: course of current medical issues. Discussed self-management of symptoms. Outlined signs and symptoms indicating need for more acute intervention. Patient verbalized understanding and all questions were answered. Patient received an After-Visit Summary.    Orders Placed This Encounter  Procedures   POCT HgB A1C    Meds ordered this encounter  Medications   dapagliflozin propanediol (FARXIGA) 10 MG TABS tablet    Sig: TAKE 1 TABLET BY MOUTH  DAILY BEFORE BREAKFAST    Dispense:  90 tablet    Refill:  1    Requesting 1 year supply> no   diazepam (VALIUM) 5 MG tablet    Sig: Take 1 tablet (5 mg total) by mouth every 6 (six) hours as needed for anxiety.    Dispense:  120 tablet    Refill:  5   JANUMET XR [phone] MG TB24    Sig: Take 1 tablet by mouth daily.    Dispense:  90 tablet    Refill:  1   lisinopril (ZESTRIL) 20 MG tablet    Sig: Take 1 tablet (20 mg total) by mouth daily.    Dispense:  90 tablet    Refill:  1    DISCONTINUE combo lisinopril-hctz please. 3rd request   DISCONTD: metoprolol succinate (TOPROL-XL) 25 MG 24 hr tablet    Sig: Take 1 tablet (25 mg total) by mouth daily.    Dispense:  90 tablet    Refill:  1   naproxen (NAPROSYN) 500 MG tablet    Sig: Every 12 hours with food as needed only    Dispense:  180 tablet    Refill:  1   omega-3 acid ethyl esters (LOVAZA) 1 g capsule    Sig: Take 1 capsule (1 g total) by mouth daily.    Dispense:  90 capsule    Refill:  2    Requesting 1 year supply   rosuvastatin (CRESTOR) 40 MG tablet    Sig: Take 1 tablet (40 mg total) by mouth at bedtime.    Dispense:  90 tablet    Refill:  3   traZODone (DESYREL) 100 MG tablet    Sig: Take 1 tablet (100 mg total) by  mouth at bedtime as needed for sleep.    Dispense:  90 tablet    Refill:  1   metoprolol tartrate (LOPRESSOR) 25 MG tablet    Sig: Take 0.5 tablets (12.5 mg total) by mouth 2 (two) times daily.    Dispense:   90 tablet    Refill:  1    DC extended release   insulin glargine (LANTUS SOLOSTAR) 100 UNIT/ML Solostar Pen    Sig: Inject 65 Units into the skin daily.    Dispense:  60 mL    Refill:  1    USE THIS SCRIPT PLEASE!!! DC ALL THE other scripts please.     Referral Orders  No referral(s) requested today     Note is dictated utilizing voice recognition software. Although note has been proof read prior to signing, occasional typographical errors still can be missed. If any questions arise, please do not hesitate to call for verification.   electronically signed by:  Felix Pacini, DO  [facility] [place]

## 2023-03-24 NOTE — Patient Instructions (Addendum)
Return in about 24 weeks (around 09/08/2023).        Great to see you today.  I have refilled the medication(s) we provide.   If labs were collected, we will inform you of lab results once received either by echart message or telephone call.   - echart message- for normal results that have been seen by the patient already.   - telephone call: abnormal results or if patient has not viewed results in their echart.

## 2023-04-04 ENCOUNTER — Telehealth: Payer: Self-pay | Admitting: Cardiology

## 2023-04-04 NOTE — Telephone Encounter (Signed)
*  STAT* If patient is at the pharmacy, call can be transferred to refill team.   1. Which medications need to be refilled? (please list name of each medication and dose if known) new prescription for Nitroglycerin Asublingual- changed pharmacy  2. Which pharmacy/location (including street and city if local pharmacy) is medication to be sent to?[facility]  3. Do they need a 30 day or 90 day supply?

## 2023-05-01 ENCOUNTER — Telehealth: Payer: Self-pay

## 2023-05-01 ENCOUNTER — Ambulatory Visit: Payer: Self-pay

## 2023-05-01 NOTE — Patient Instructions (Signed)
Visit Information  Thank you for taking time to visit with me today. Please don't hesitate to contact me if I can be of assistance to you.   Following are the goals we discussed today:   Goals Addressed               This Visit's Progress     Diabetes Management (pt-stated)        Patient Goals/Self Care Activities: -Patient/Caregiver will self-administer medications as prescribed as evidenced by self-report/primary caregiver report  -Patient/Caregiver will attend all scheduled provider appointments as evidenced by clinician review of documented attendance to scheduled appointments and patient/caregiver report -Patient/Caregiver will call provider office for new concerns or questions as evidenced by review of documented incoming telephone call notes and patient report  -check blood sugar at prescribed times -check blood sugar if I feel it is too high or too low -record values and write them down take them to all doctor visits  -limit carbohydrates  -Blood sugars running 120-130 range.  Discussed continued diabetes management.           Our next appointment is by telephone on 07/03/23 at 1130 am  Please call the care guide team at [phone] if you need to cancel or reschedule your appointment.   If you are experiencing a Mental Health or Behavioral Health Crisis or need someone to talk to, please call the Suicide and Crisis Lifeline: 988   Patient verbalizes understanding of instructions and care plan provided today and agrees to view in MyChart. Active MyChart status and patient understanding of how to access instructions and care plan via MyChart confirmed with patient.     The patient has been provided with contact information for the care management team and has been advised to call with any health related questions or concerns.   Bary Leriche, RN, MSN [facility] Care Management Care Management Coordinator Direct Line [phone]

## 2023-05-01 NOTE — Patient Outreach (Signed)
  Care Coordination   05/01/2023 Name: [name] MRN: [id number] DOB: 10-18-1956   Care Coordination Outreach Attempts:  An unsuccessful telephone outreach was attempted today to offer the patient information about available care coordination services.  Follow Up Plan:  Additional outreach attempts will be made to offer the patient care coordination information and services.   Encounter Outcome:  No Answer   Care Coordination Interventions:  No, not indicated    Bary Leriche, RN, MSN [facility] Care Management Care Management Coordinator Direct Line [phone]

## 2023-05-01 NOTE — Patient Outreach (Signed)
  Care Coordination   Follow Up Visit Note   05/01/2023 Name: AMMAN HENRICH MRN: [id number] DOB: 04-05-1956  [name] is a 67 y.o. year old male who sees Kuneff, Renee A, DO for primary care. I spoke with  Nada Boozer Albrecht by phone today.  What matters to the patients health and wellness today?  Low heart rate and blood pressure.  Patient to check pressure and record and notify physician.       Goals Addressed               This Visit's Progress     Diabetes Management (pt-stated)        Patient Goals/Self Care Activities: -Patient/Caregiver will self-administer medications as prescribed as evidenced by self-report/primary caregiver report  -Patient/Caregiver will attend all scheduled provider appointments as evidenced by clinician review of documented attendance to scheduled appointments and patient/caregiver report -Patient/Caregiver will call provider office for new concerns or questions as evidenced by review of documented incoming telephone call notes and patient report  -check blood sugar at prescribed times -check blood sugar if I feel it is too high or too low -record values and write them down take them to all doctor visits  -limit carbohydrates  -Blood sugars running 120-130 range.  Discussed continued diabetes management.           SDOH assessments and interventions completed:  Yes     Care Coordination Interventions:  Yes, provided   Follow up plan: Follow up call scheduled for August    Encounter Outcome:  Pt. Visit Completed   Bary Leriche, RN, MSN [facility] Care Management Care Management Coordinator Direct Line [phone]

## 2023-05-02 ENCOUNTER — Telehealth: Payer: Self-pay | Admitting: Family Medicine

## 2023-05-02 NOTE — Telephone Encounter (Signed)
[name] did not want to schedule an appointment just yet.  He reports that during his last visit his heart rate was low and Dr. Claiborne Billings switched his metoprolol. He does not feel that it has increased his heart rate.  He reports that on Saturday he checked his vitals and his BP was 99/44 and heart rate was 51. Please give the patient a call to discuss if he should be scheduled for an appointment.

## 2023-05-03 MED ORDER — CARVEDILOL 3.125 MG PO TABS: 3.1250 mg | ORAL_TABLET | Freq: Two times a day (BID) | ORAL | 5 refills | Status: DC

## 2023-05-03 NOTE — Telephone Encounter (Signed)
LM for pt to return call to discuss.  

## 2023-05-03 NOTE — Telephone Encounter (Signed)
Please inform pt I have called in new medicine to replace the metoprolol tartrate.  He we will stop the metoprolol and replace it with a medication called carvedilol. It is still the same type of medicine so we will get the cardiovascular protection, but it is a lower dose of this type of medicine than is available in the metoprolol. Carvedilol is 1 tab every 12 hours.

## 2023-05-31 ENCOUNTER — Ambulatory Visit (INDEPENDENT_AMBULATORY_CARE_PROVIDER_SITE_OTHER): Payer: 59 | Admitting: Family Medicine

## 2023-05-31 ENCOUNTER — Encounter: Payer: Self-pay | Admitting: Family Medicine

## 2023-05-31 VITALS — BP 123/65 | HR 46 | Temp 97.6°F | Ht 71.0 in | Wt 205.2 lb

## 2023-05-31 DIAGNOSIS — R051 Acute cough: Secondary | ICD-10-CM | POA: Diagnosis not present

## 2023-05-31 DIAGNOSIS — R5383 Other fatigue: Secondary | ICD-10-CM

## 2023-05-31 DIAGNOSIS — R002 Palpitations: Secondary | ICD-10-CM | POA: Diagnosis not present

## 2023-05-31 DIAGNOSIS — R001 Bradycardia, unspecified: Secondary | ICD-10-CM | POA: Diagnosis not present

## 2023-05-31 MED ORDER — AZITHROMYCIN 250 MG PO TABS: ORAL_TABLET | ORAL | 0 refills | Status: AC

## 2023-05-31 NOTE — Progress Notes (Signed)
[name] , 21-Feb-1956, 67 y.o., male MRN: [id number] Patient Care Team    Relationship Specialty Notifications Start End  Natalia Leatherwood, DO PCP - General Family Medicine  11/29/18   Lewayne Bunting, MD PCP - Cardiology Cardiology  08/07/20   Rennis Chris, MD Consulting Physician Ophthalmology  01/09/19   Fleeta Emmer, RN [facility] Care Management   01/03/23     Chief Complaint  Patient presents with   Heart Rate     Low HR; c/o  chest discomfort; light-headiness; SOB; fatigue     Subjective: [name] is a 67 y.o. Pt presents for an OV with complaints of fatigue, mild shortness of breath with activity,  lower heart rate.  Patient reports he also has noticed over the last couple weeks right lower chest/abdomen discomfort with taking a deep breath.  Patient has not been physically active for about a year.  He reports when he tries to get up and be physiologically active he becomes short of breath.  He reports he is sleeping more. He is prescribed trazodone to help him sleep at night, he states he has cut back on the trazodone he is still sleeping more.  He also was prescribed Valium for his anxiety 5 mg 4 times daily. Last visit we attempted decrease his beta-blocker medication regimen.    03/24/2023    1:35 PM 12/09/2022    1:43 PM 12/05/2022    9:54 AM 08/19/2022   12:57 PM 11/10/2021    2:10 PM  Depression screen PHQ 2/9  Decreased Interest 0 2 1 0 0  Down, Depressed, Hopeless 0 0 0 0 0  PHQ - 2 Score 0 2 1 0 0  Altered sleeping  0     Tired, decreased energy  2     Change in appetite  3     Feeling bad or failure about yourself   0     Trouble concentrating  2     Moving slowly or fidgety/restless  0     Suicidal thoughts  0     PHQ-9 Score  9       Allergies  Allergen Reactions   Gabapentin Other (See Comments)    Paranoia/hallucinations   Hydrocodone Other (See Comments)    PT reports  Confusion when tacking Hydrocodone   Nsaids      History of heart disease, CAD with history of MI, CABG status post stent   Prednisone     Paranoia   Tramadol    Penicillins Other (See Comments)    Childhood unsure if still allgeric   Social History   Social History Narrative   Marital status/children/pets: Single   Education/employment: HS grad, 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:      -smoke alarm in the home:Yes     - wears seatbelt: Yes      Past Medical History:  Diagnosis Date   Alcohol addiction (HCC)    Anxiety    Arthritis    Bulging of lumbar intervertebral disc 2017   MRI: L3-4, L4-5 and L5-S1- facet hypertrophy and formainal stenosis   CAD (coronary artery disease)    Coronary artery disease with hx of myocardial infarct w/o hx of CABG 2004   Depression    Dextrocardia    Diabetes (HCC)    Heart attack (HCC) 2004   Stent placement 2008   Hyperlipidemia    Hypertension    Insomnia    Past  Surgical History:  Procedure Laterality Date   CORONARY ANGIOPLASTY WITH STENT PLACEMENT  2008   Family History  Problem Relation Age of Onset   Heart attack Mother    Heart disease Mother    Diabetes Mother    Arthritis Mother    Hyperlipidemia Mother    Hypertension Mother    Breast cancer Mother    Heart attack Father    Heart disease Father    Alcohol abuse Father    Early death Father    Arthritis Sister    Heart disease Sister    Hyperlipidemia Sister    Asthma Sister    COPD Sister    Diabetes Sister    Alcohol abuse Sister    Allergies as of 05/31/2023       Reactions   Gabapentin Other (See Comments)   Paranoia/hallucinations   Hydrocodone Other (See Comments)   PT reports  Confusion when tacking Hydrocodone   Nsaids    History of heart disease, CAD with history of MI, CABG status post stent   Prednisone    Paranoia   Tramadol    Penicillins Other (See Comments)   Childhood unsure if still allgeric        Medication List        Accurate as of May 31, 2023  1:59 PM. If you have any  questions, ask your nurse or doctor.          Accu-Chek FastClix Lancets Misc   aspirin EC 81 MG tablet Take 1 tablet (81 mg total) by mouth daily.   azithromycin 250 MG tablet Commonly known as: ZITHROMAX Take 2 tablets on day 1, then 1 tablet daily on days 2 through 5 Started by: Felix Pacini, DO   carvedilol 3.125 MG tablet Commonly known as: COREG Take 1 tablet (3.125 mg total) by mouth 2 (two) times daily with a meal. What changed: additional instructions   dapagliflozin propanediol 10 MG Tabs tablet Commonly known as: Farxiga TAKE 1 TABLET BY MOUTH  DAILY BEFORE BREAKFAST   diazepam 5 MG tablet Commonly known as: VALIUM Take 1 tablet (5 mg total) by mouth every 6 (six) hours as needed for anxiety.   GNP UltiCare Pen Needles 31G X 5 MM Misc Generic drug: Insulin Pen Needle AS DIRECTED ONCE DAILY   Janumet XR [phone] MG Tb24 Generic drug: SitaGLIPtin-MetFORMIN HCl Take 1 tablet by mouth daily.   Lantus SoloStar 100 UNIT/ML Solostar Pen Generic drug: insulin glargine Inject 65 Units into the skin daily.   lisinopril 20 MG tablet Commonly known as: ZESTRIL Take 1 tablet (20 mg total) by mouth daily.   naproxen 500 MG tablet Commonly known as: NAPROSYN Every 12 hours with food as needed only   nitroGLYCERIN 0.4 MG SL tablet Commonly known as: NITROSTAT DISSOLVE 1 TABLET UNDER THE TONGUE EVERY 5 MINUTES AS  NEEDED FOR CHEST PAIN. MAX  OF 3 TABLETS IN 15 MINUTES. CALL 911 IF PAIN PERSISTS.   omega-3 acid ethyl esters 1 g capsule Commonly known as: LOVAZA Take 1 capsule (1 g total) by mouth daily.   OneTouch Ultra test strip Generic drug: glucose blood CHECK BLOOD SUGAR AS DIRECTED   rosuvastatin 40 MG tablet Commonly known as: CRESTOR Take 1 tablet (40 mg total) by mouth at bedtime.   traZODone 100 MG tablet Commonly known as: DESYREL Take 1 tablet (100 mg total) by mouth at bedtime as needed for sleep.        All past medical history, surgical  history, allergies, family history, immunizations andmedications were updated in the EMR today and reviewed under the history and medication portions of their EMR.     Review of Systems  Constitutional:  Positive for malaise/fatigue. Negative for chills and fever.  HENT:  Negative for sore throat.   Respiratory:  Positive for cough.   Gastrointestinal:  Positive for nausea. Negative for vomiting.  Neurological:  Positive for dizziness and headaches.   Negative, with the exception of above mentioned in HPI   Objective:  BP 123/65   Pulse (!) 46   Temp 97.6 F (36.4 C)   Ht 5\' 11"  (1.803 m)   Wt 205 lb 3.2 oz (93.1 kg)   SpO2 96%   BMI 28.62 kg/m  Body mass index is 28.62 kg/m. Physical Exam Constitutional:      General: He is not in acute distress.    Appearance: Normal appearance. He is normal weight. He is not ill-appearing, toxic-appearing or diaphoretic.  HENT:     Nose: No congestion or rhinorrhea.     Mouth/Throat:     Mouth: Mucous membranes are moist.     Pharynx: No oropharyngeal exudate or posterior oropharyngeal erythema.  Eyes:     Extraocular Movements: Extraocular movements intact.     Pupils: Pupils are equal, round, and reactive to light.  Cardiovascular:     Rate and Rhythm: Regular rhythm. Bradycardia present.     Heart sounds: Murmur heard.  Pulmonary:     Effort: Pulmonary effort is normal. No respiratory distress.     Breath sounds: Normal breath sounds. No wheezing, rhonchi or rales.  Musculoskeletal:     Right lower leg: No edema.     Left lower leg: No edema.  Skin:    Findings: No rash.  Neurological:     Mental Status: He is alert.    No results found. No results found. No results found for this or any previous visit (from the past 24 hour(s)).  Assessment/Plan: [name] is a 67 y.o. male present for OV for  Bradycardia/fatigue Stop Coreg.  Low heart rate is likely contributing to his fatigue.  He is quite a few years out from  his MI. Hydrate - TSH - Iron, TIBC and Ferritin Panel - Vitamin D (25 hydroxy) - Comp Met (CMET) Patient is prescribed trazodone to help with sleep and Valium for his anxiety.  Historically this has worked well for him without causing any sedation, will ensure normal liver and kidney function.  It is possible he is not clearing medication as well now.  Vitamin D deficiency: He stopped taking his vitamin D supplement.  His vitamin D was 11 and we were able to get him up into normal range with the supplement.  Will recheck today to ensure not because of added fatigue. Also encouraged him to restart his B12 supplement  Acute cough/chest discomfort Lung exam today was normal.  Possible mild bronchitis versus early pneumonia etc.  Will treat with Z-Pak. Follow-up 1-2 weeks if symptoms do not resolve, sooner if worsening  Reviewed expectations re: course of current medical issues. Discussed self-management of symptoms. Outlined signs and symptoms indicating need for more acute intervention. Patient verbalized understanding and all questions were answered. Patient received an After-Visit Summary.    Orders Placed This Encounter  Procedures   TSH   Iron, TIBC and Ferritin Panel   Vitamin D (25 hydroxy)   Comp Met (CMET)   Meds ordered this encounter  Medications   azithromycin (  ZITHROMAX) 250 MG tablet    Sig: Take 2 tablets on day 1, then 1 tablet daily on days 2 through 5    Dispense:  6 tablet    Refill:  0   Referral Orders  No referral(s) requested today     Note is dictated utilizing voice recognition software. Although note has been proof read prior to signing, occasional typographical errors still can be missed. If any questions arise, please do not hesitate to call for verification.   electronically signed by:  Felix Pacini, DO  [facility] [place]

## 2023-05-31 NOTE — Patient Instructions (Addendum)
Stop coreg. I believe you will feel better if we can get your heart rate up above 60.  Hydrate well.  Start vit d and b12.

## 2023-06-01 LAB — COMPREHENSIVE METABOLIC PANEL
AG Ratio: 1.6 (calc) (ref 1.0–2.5)
ALT: 22 U/L (ref 9–46)
AST: 21 U/L (ref 10–35)
Albumin: 4.6 g/dL (ref 3.6–5.1)
Alkaline phosphatase (APISO): 39 U/L (ref 35–144)
BUN: 15 mg/dL (ref 7–25)
CO2: 24 mmol/L (ref 20–32)
Calcium: 10.3 mg/dL (ref 8.6–10.3)
Chloride: 104 mmol/L (ref 98–110)
Creat: 0.9 mg/dL (ref 0.70–1.35)
Globulin: 2.8 g/dL (calc) (ref 1.9–3.7)
Glucose, Bld: 82 mg/dL (ref 65–99)
Potassium: 5.3 mmol/L (ref 3.5–5.3)
Sodium: 141 mmol/L (ref 135–146)
Total Bilirubin: 0.4 mg/dL (ref 0.2–1.2)
Total Protein: 7.4 g/dL (ref 6.1–8.1)

## 2023-06-01 LAB — IRON,TIBC AND FERRITIN PANEL
%SAT: 25 % (calc) (ref 20–48)
Ferritin: 87 ng/mL (ref 24–380)
Iron: 92 ug/dL (ref 50–180)
TIBC: 362 mcg/dL (calc) (ref 250–425)

## 2023-06-01 LAB — VITAMIN D 25 HYDROXY (VIT D DEFICIENCY, FRACTURES): Vit D, 25-Hydroxy: 40 ng/mL (ref 30–100)

## 2023-06-01 LAB — TSH: TSH: 2.88 mIU/L (ref 0.40–4.50)

## 2023-06-28 ENCOUNTER — Encounter (INDEPENDENT_AMBULATORY_CARE_PROVIDER_SITE_OTHER): Payer: Self-pay

## 2023-07-03 ENCOUNTER — Ambulatory Visit: Payer: Self-pay

## 2023-07-03 NOTE — Patient Outreach (Signed)
  Care Coordination   07/03/2023 Name: [name] MRN: [id number] DOB: 1956-05-30   Care Coordination Outreach Attempts:  An unsuccessful telephone outreach was attempted today to offer the patient information about available care coordination services.  Follow Up Plan:  Additional outreach attempts will be made to offer the patient care coordination information and services.   Encounter Outcome:  No Answer   Care Coordination Interventions:  No, not indicated     Bary Leriche, RN, MSN [facility] Care Management Care Management Coordinator Direct Line [phone]

## 2023-07-10 ENCOUNTER — Telehealth: Payer: Self-pay

## 2023-07-10 NOTE — Patient Outreach (Signed)
  Care Coordination   07/10/2023 Name: [name] MRN: [id number] DOB: May 13, 1956   Care Coordination Outreach Attempts:  A second unsuccessful outreach was attempted today to offer the patient with information about available care coordination services.  Follow Up Plan:  Additional outreach attempts will be made to offer the patient care coordination information and services.   Encounter Outcome:  No Answer   Care Coordination Interventions:  No, not indicated     Bary Leriche, RN, MSN [facility] Care Management Care Management Coordinator Direct Line [phone]

## 2023-07-13 ENCOUNTER — Encounter (INDEPENDENT_AMBULATORY_CARE_PROVIDER_SITE_OTHER): Payer: Self-pay

## 2023-07-13 ENCOUNTER — Telehealth: Payer: Self-pay

## 2023-07-13 NOTE — Patient Instructions (Signed)
Visit Information  Thank you for taking time to visit with me today. Please don't hesitate to contact me if I can be of assistance to you.   Following are the goals we discussed today:   Goals Addressed               This Visit's Progress     Diabetes Management (pt-stated)        Patient Goals/Self Care Activities: -Patient/Caregiver will self-administer medications as prescribed as evidenced by self-report/primary caregiver report  -Patient/Caregiver will attend all scheduled provider appointments as evidenced by clinician review of documented attendance to scheduled appointments and patient/caregiver report -Patient/Caregiver will call provider office for new concerns or questions as evidenced by review of documented incoming telephone call notes and patient report  -check blood sugar at prescribed times -check blood sugar if I feel it is too high or too low -record values and write them down take them to all doctor visits  -limit carbohydrates   Patient doing okay.  PCP appointment on tomorrow.  Blood sugars running 140-150 range.  Last Blood pressure check 120/60.  Discussed continued diabetes and HTN management. No concerns.            Our next appointment is by telephone on 09/11/23 at 1130 am  Please call the care guide team at [phone] if you need to cancel or reschedule your appointment.   If you are experiencing a Mental Health or Behavioral Health Crisis or need someone to talk to, please call the Suicide and Crisis Lifeline: 988   Patient verbalizes understanding of instructions and care plan provided today and agrees to view in MyChart. Active MyChart status and patient understanding of how to access instructions and care plan via MyChart confirmed with patient.     The patient has been provided with contact information for the care management team and has been advised to call with any health related questions or concerns.   Bary Leriche, RN, MSN [facility]  Care Management Care Management Coordinator Direct Line [phone]

## 2023-07-13 NOTE — Patient Outreach (Signed)
  Care Coordination   Follow Up Visit Note   07/13/2023 Name: [name] MRN: [id number] DOB: 1956-08-17  [name] is Merritt 67 y.o. year old male who sees Kuneff, Renee A, DO for primary care. I spoke with  [name] by phone today.  What matters to the patients health and wellness today?  Maintain health    Goals Addressed               This Visit's Progress     Diabetes Management (pt-stated)        Patient Goals/Self Care Activities: -Patient/Caregiver will self-administer medications as prescribed as evidenced by self-report/primary caregiver report  -Patient/Caregiver will attend all scheduled provider appointments as evidenced by clinician review of documented attendance to scheduled appointments and patient/caregiver report -Patient/Caregiver will call provider office for new concerns or questions as evidenced by review of documented incoming telephone call notes and patient report  -check blood sugar at prescribed times -check blood sugar if I feel it is too high or too low -record values and write them down take them to all doctor visits  -limit carbohydrates   Patient doing okay.  PCP appointment on tomorrow.  Blood sugars running 140-150 range.  Last Blood pressure check 120/60.  Discussed continued diabetes and HTN management. No concerns.            SDOH assessments and interventions completed:  Yes     Care Coordination Interventions:  Yes, provided   Follow up plan: Follow up call scheduled for October    Encounter Outcome:  Pt. Visit Completed   Daniel Leriche, RN, MSN [name] Care Management Care Management Coordinator Direct Line [phone]

## 2023-07-14 ENCOUNTER — Ambulatory Visit (INDEPENDENT_AMBULATORY_CARE_PROVIDER_SITE_OTHER): Payer: 59 | Admitting: Family Medicine

## 2023-07-14 ENCOUNTER — Encounter: Payer: Self-pay | Admitting: Family Medicine

## 2023-07-14 VITALS — BP 129/73 | HR 45 | Temp 98.1°F | Wt 210.4 lb

## 2023-07-14 DIAGNOSIS — I517 Cardiomegaly: Secondary | ICD-10-CM

## 2023-07-14 DIAGNOSIS — F5101 Primary insomnia: Secondary | ICD-10-CM | POA: Diagnosis not present

## 2023-07-14 DIAGNOSIS — E1142 Type 2 diabetes mellitus with diabetic polyneuropathy: Secondary | ICD-10-CM

## 2023-07-14 DIAGNOSIS — M5136 Other intervertebral disc degeneration, lumbar region: Secondary | ICD-10-CM

## 2023-07-14 DIAGNOSIS — E113293 Type 2 diabetes mellitus with mild nonproliferative diabetic retinopathy without macular edema, bilateral: Secondary | ICD-10-CM

## 2023-07-14 DIAGNOSIS — E785 Hyperlipidemia, unspecified: Secondary | ICD-10-CM

## 2023-07-14 DIAGNOSIS — Z794 Long term (current) use of insulin: Secondary | ICD-10-CM

## 2023-07-14 DIAGNOSIS — I77810 Thoracic aortic ectasia: Secondary | ICD-10-CM

## 2023-07-14 DIAGNOSIS — F411 Generalized anxiety disorder: Secondary | ICD-10-CM

## 2023-07-14 DIAGNOSIS — M47817 Spondylosis without myelopathy or radiculopathy, lumbosacral region: Secondary | ICD-10-CM

## 2023-07-14 DIAGNOSIS — Z23 Encounter for immunization: Secondary | ICD-10-CM | POA: Diagnosis not present

## 2023-07-14 DIAGNOSIS — I2510 Atherosclerotic heart disease of native coronary artery without angina pectoris: Secondary | ICD-10-CM | POA: Diagnosis not present

## 2023-07-14 DIAGNOSIS — R001 Bradycardia, unspecified: Secondary | ICD-10-CM

## 2023-07-14 DIAGNOSIS — Z955 Presence of coronary angioplasty implant and graft: Secondary | ICD-10-CM

## 2023-07-14 DIAGNOSIS — E559 Vitamin D deficiency, unspecified: Secondary | ICD-10-CM | POA: Diagnosis not present

## 2023-07-14 DIAGNOSIS — I350 Nonrheumatic aortic (valve) stenosis: Secondary | ICD-10-CM

## 2023-07-14 DIAGNOSIS — I10 Essential (primary) hypertension: Secondary | ICD-10-CM | POA: Diagnosis not present

## 2023-07-14 DIAGNOSIS — I252 Old myocardial infarction: Secondary | ICD-10-CM

## 2023-07-14 DIAGNOSIS — E1169 Type 2 diabetes mellitus with other specified complication: Secondary | ICD-10-CM

## 2023-07-14 DIAGNOSIS — Z7984 Long term (current) use of oral hypoglycemic drugs: Secondary | ICD-10-CM

## 2023-07-14 LAB — MICROALBUMIN / CREATININE URINE RATIO
Creatinine,U: 59.7 mg/dL
Microalb Creat Ratio: 4.7 mg/g (ref 0.0–30.0)
Microalb, Ur: 2.8 mg/dL — ABNORMAL HIGH (ref 0.0–1.9)

## 2023-07-14 LAB — POCT GLYCOSYLATED HEMOGLOBIN (HGB A1C)
HbA1c POC (<> result, manual entry): 7 % (ref 4.0–5.6)
HbA1c, POC (controlled diabetic range): 7 % (ref 0.0–7.0)
HbA1c, POC (prediabetic range): 7 % — AB (ref 5.7–6.4)
Hemoglobin A1C: 7 % — AB (ref 4.0–5.6)

## 2023-07-14 MED ORDER — JANUMET XR 100-1000 MG PO TB24: 1.0000 | ORAL_TABLET | Freq: Every day | ORAL | 1 refills | Status: DC

## 2023-07-14 MED ORDER — DAPAGLIFLOZIN PROPANEDIOL 10 MG PO TABS: ORAL_TABLET | ORAL | 1 refills | Status: DC

## 2023-07-14 MED ORDER — LANTUS SOLOSTAR 100 UNIT/ML ~~LOC~~ SOPN: 65.0000 [IU] | PEN_INJECTOR | Freq: Every day | SUBCUTANEOUS | 1 refills | Status: DC

## 2023-07-14 MED ORDER — DIAZEPAM 5 MG PO TABS: 5.0000 mg | ORAL_TABLET | Freq: Four times a day (QID) | ORAL | 5 refills | Status: DC | PRN

## 2023-07-14 MED ORDER — NAPROXEN 500 MG PO TABS: ORAL_TABLET | ORAL | 1 refills | Status: DC

## 2023-07-14 MED ORDER — TRAZODONE HCL 100 MG PO TABS: 100.0000 mg | ORAL_TABLET | Freq: Every evening | ORAL | 1 refills | Status: DC | PRN

## 2023-07-14 MED ORDER — LISINOPRIL 20 MG PO TABS: 20.0000 mg | ORAL_TABLET | Freq: Every day | ORAL | 1 refills | Status: DC

## 2023-07-14 NOTE — Progress Notes (Signed)
JSON [name] , 06/27/1956, 67 y.o., male MRN: [id number] Patient Care Team    Relationship Specialty Notifications Start End  Natalia Leatherwood, DO PCP - General Family Medicine  11/29/18   Lewayne Bunting, MD PCP - Cardiology Cardiology  08/07/20   Rennis Chris, MD Consulting Physician Ophthalmology  01/09/19   Fleeta Emmer, RN [facility] Care Management   01/03/23     Chief Complaint  Patient presents with   Diabetes     Subjective: Pt presents for an OV for  Diabetes with complications/overweight:  Patient reports he has been a diabetic since 1998.   He reports with compliance w/  Janumet/Metformin 1000-100 mg daily, Farxiga 10 mg daily and Lantus 65 units daily.  Patient denies dizziness, hyperglycemic or hypoglycemic events. Patient denies numbness, tingling in the extremities or nonhealing wounds of feet.   Depression/anxiety/insomnia: Patient reports he is feeling well with Valium 4 times daily and trazodone 100 mg nightly.   Sleeping well Prior note He had been on BuSpar as well which he originally thought was helpful, but then he felt that it was not as helpful.   [place] controlled substance database reviewed today Original note: He states he has a rather routine day.  He does not have much social contact in a day.  He lives by himself.  He reports that he always feels like he "stays on edge.  "He reports when he wakes up he is already feeling on edge and he will take a Valium at that time.  He then has breakfast and goes on a 4 mile walk daily.  For lunch he starts to feel his anxiety sudden and he becomes panicked.  He states when he feels this way he will break out in a sweat, become very fearful and scared.  He states he commonly fears there is someone in the house with a gun.  He goes to his bedroom gets in his bed. He states the fear is so intense and he just cannot control it.  He denies any known trauma or event experienced in his life that  would create such a fear.  He states he did go see a counselor/psychiatrist at [facility] in the past 6 months, and he did not feel that was helpful for him.  Denies any SI or HI.  Hypertension/CAD/S/p Stent/prior MI:  Pt has a significant cardiac history of "anterior MI in 2004 with stents was LAD and then stents to branch vessel 2008 (by DR. Renaldo-cardio note 2015)"  Patient reports compliance with lisinopril 20 mg daily and metoprolol 25 mg daily, Lovaza, and Crestor.   He is not compliant with baby aspirin 81 mg daily.  Plavix was DC'd by cards.  His statin was increased by cardiology to Crestor 40 mg daily.  Patient denies chest pain, shortness of breath, dizziness or lower extremity edema.   Diet: Low-sodium Exercise: Routine exercise daily RF: Hypertension, hyperlipidemia, diabetes, overweight, family history, CAD, MI   Neck pain/back pain: pt reports back pain is about the same. The naproxen twice daily as needed is helpful. Prior note: Patient has had recurrent posterior neck pain and headache.  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  He states he found an old prescription of naproxen and restarted it the last time he had symptoms.  He states it worked well for him.  He is wondering if we could get refills  for him today.  In the past he has stated he could not tolerate NSAIDs.     Erectile dysfunction: (Documentation only) patient would like to start a medication for erectile dysfunction.  He states he seen a urologist many years ago and does not desire to see a urologist again.  Patient has a significant medical history of blood pressure with systolic less than 100 and history of CAD and MI.  Echo 06/08/2021: EF 50-55%.  LVH.  Moderate calcification of aortic valve.  echo 03/06/2020:  1. Technically difficult study. Left ventricular ejection fraction, by  estimation, is 45 to 50%. The left ventricle has grossly mildly decreased   function. Image quality is not sufficient to assess for regional wall  motion abnormalities. Left ventricular   diastolic parameters are indeterminate.   2. Right ventricle is poorly visualized. Grossly normal size and systolic  function. Tricuspid regurgitation signal is inadequate for assessing PA  pressure.   3. The mitral valve is normal in structure. No evidence of mitral valve  regurgitation.   4. Aortic dilatation noted. There is mild dilatation of the ascending  aorta measuring 37 mm.   5. The aortic valve is abnormal. Moderately calcified. Aortic valve  regurgitation is not visualized. Mild aortic valve stenosis. Vmax 2.2 m/s,  MG 10 mmHg, AVA 1.7 cm^2, DI 0.4      03/24/2023    1:35 PM 12/09/2022    1:43 PM 12/05/2022    9:54 AM 08/19/2022   12:57 PM 11/10/2021    2:10 PM  Depression screen PHQ 2/9  Decreased Interest 0 2 1 0 0  Down, Depressed, Hopeless 0 0 0 0 0  PHQ - 2 Score 0 2 1 0 0  Altered sleeping  0     Tired, decreased energy  2     Change in appetite  3     Feeling bad or failure about yourself   0     Trouble concentrating  2     Moving slowly or fidgety/restless  0     Suicidal thoughts  0     PHQ-9 Score  9       Allergies  Allergen Reactions   Gabapentin Other (See Comments)    Paranoia/hallucinations   Hydrocodone Other (See Comments)    PT reports  Confusion when tacking Hydrocodone   Nsaids     History of heart disease, CAD with history of MI, CABG status post stent   Prednisone     Paranoia   Tramadol    Penicillins Other (See Comments)    Childhood unsure if still allgeric   Social History   Social History Narrative   Marital status/children/pets: Single   Education/employment: HS grad, 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:      -smoke alarm in the home:Yes     - wears seatbelt: Yes      Past Medical History:  Diagnosis Date   Alcohol addiction (HCC)    Anxiety    Arthritis    Bulging of lumbar intervertebral disc 2017   MRI:  L3-4, L4-5 and L5-S1- facet hypertrophy and formainal stenosis   CAD (coronary artery disease)    Coronary artery disease with hx of myocardial infarct w/o hx of CABG 2004   Depression    Dextrocardia    Diabetes (HCC)    Heart attack (HCC) 2004   Stent placement 2008   Hyperlipidemia    Hypertension    Insomnia    Past Surgical History:  Procedure  Laterality Date   CORONARY ANGIOPLASTY WITH STENT PLACEMENT  2008   Family History  Problem Relation Age of Onset   Heart attack Mother    Heart disease Mother    Diabetes Mother    Arthritis Mother    Hyperlipidemia Mother    Hypertension Mother    Breast cancer Mother    Heart attack Father    Heart disease Father    Alcohol abuse Father    Early death Father    Arthritis Sister    Heart disease Sister    Hyperlipidemia Sister    Asthma Sister    COPD Sister    Diabetes Sister    Alcohol abuse Sister    Allergies as of 07/14/2023       Reactions   Gabapentin Other (See Comments)   Paranoia/hallucinations   Hydrocodone Other (See Comments)   PT reports  Confusion when tacking Hydrocodone   Nsaids    History of heart disease, CAD with history of MI, CABG status post stent   Prednisone    Paranoia   Tramadol    Penicillins Other (See Comments)   Childhood unsure if still allgeric        Medication List        Accurate as of July 14, 2023  2:34 PM. If you have any questions, ask your nurse or doctor.          STOP taking these medications    carvedilol 3.125 MG tablet Commonly known as: COREG Stopped by: Felix Pacini   metoprolol tartrate 25 MG tablet Commonly known as: LOPRESSOR Stopped by: Felix Pacini       TAKE these medications    Accu-Chek FastClix Lancets Misc   aspirin EC 81 MG tablet Take 1 tablet (81 mg total) by mouth daily.   dapagliflozin propanediol 10 MG Tabs tablet Commonly known as: Farxiga TAKE 1 TABLET BY MOUTH  DAILY BEFORE BREAKFAST   diazepam 5 MG tablet Commonly  known as: VALIUM Take 1 tablet (5 mg total) by mouth every 6 (six) hours as needed for anxiety.   GNP UltiCare Pen Needles 31G X 5 MM Misc Generic drug: Insulin Pen Needle AS DIRECTED ONCE DAILY   Janumet XR [phone] MG Tb24 Generic drug: SitaGLIPtin-MetFORMIN HCl Take 1 tablet by mouth daily.   Lantus SoloStar 100 UNIT/ML Solostar Pen Generic drug: insulin glargine Inject 65 Units into the skin daily.   lisinopril 20 MG tablet Commonly known as: ZESTRIL Take 1 tablet (20 mg total) by mouth daily.   naproxen 500 MG tablet Commonly known as: NAPROSYN Every 12 hours with food as needed only   nitroGLYCERIN 0.4 MG SL tablet Commonly known as: NITROSTAT DISSOLVE 1 TABLET UNDER THE TONGUE EVERY 5 MINUTES AS  NEEDED FOR CHEST PAIN. MAX  OF 3 TABLETS IN 15 MINUTES. CALL 911 IF PAIN PERSISTS.   omega-3 acid ethyl esters 1 g capsule Commonly known as: LOVAZA Take 1 capsule (1 g total) by mouth daily.   OneTouch Ultra test strip Generic drug: glucose blood CHECK BLOOD SUGAR AS DIRECTED   rosuvastatin 40 MG tablet Commonly known as: CRESTOR Take 1 tablet (40 mg total) by mouth at bedtime.   traZODone 100 MG tablet Commonly known as: DESYREL Take 1 tablet (100 mg total) by mouth at bedtime as needed for sleep.        All past medical history, surgical history, allergies, family history, immunizations andmedications were updated in the EMR today and reviewed under the history  and medication portions of their EMR.     ROS: Negative, with the exception of above mentioned in HPI   Objective:  BP 129/73   Pulse (!) 45   Temp 98.1 F (36.7 C)   Wt 210 lb 6.4 oz (95.4 kg)   SpO2 97%   BMI 29.34 kg/m  Body mass index is 29.34 kg/m. Physical Exam Vitals and nursing note reviewed.  Constitutional:      General: He is not in acute distress.    Appearance: Normal appearance. He is not ill-appearing, toxic-appearing or diaphoretic.  HENT:     Head: Normocephalic and  atraumatic.  Eyes:     General: No scleral icterus.       Right eye: No discharge.        Left eye: No discharge.     Extraocular Movements: Extraocular movements intact.     Pupils: Pupils are equal, round, and reactive to light.  Cardiovascular:     Rate and Rhythm: Regular rhythm. Bradycardia present.     Heart sounds: Murmur heard.  Pulmonary:     Effort: Pulmonary effort is normal. No respiratory distress.     Breath sounds: Normal breath sounds. No wheezing, rhonchi or rales.  Musculoskeletal:     Right lower leg: No edema.     Left lower leg: No edema.  Skin:    General: Skin is warm.     Findings: No rash.  Neurological:     Mental Status: He is alert and oriented to person, place, and time. Mental status is at baseline.  Psychiatric:        Mood and Affect: Mood normal.        Behavior: Behavior normal.        Thought Content: Thought content normal.        Judgment: Judgment normal.    Diabetic Foot Exam - Simple   Simple Foot Form Diabetic Foot exam was performed with the following findings: Yes 07/14/2023  1:59 PM  Visual Inspection No deformities, no ulcerations, no other skin breakdown bilaterally: Yes Sensation Testing Intact to touch and monofilament testing bilaterally: Yes Pulse Check Posterior Tibialis and Dorsalis pulse intact bilaterally: Yes Comments     No results found. No results found. Results for orders placed or performed in visit on 07/14/23 (from the past 24 hour(s))  POCT HgB A1C     Status: Abnormal   Collection Time: 07/14/23  2:01 PM  Result Value Ref Range   Hemoglobin A1C 7.0 (A) 4.0 - 5.6 %   HbA1c POC (<> result, manual entry) 7.0 4.0 - 5.6 %   HbA1c, POC (prediabetic range) 7.0 (A) 5.7 - 6.4 %   HbA1c, POC (controlled diabetic range) 7.0 0.0 - 7.0 %     Assessment/Plan: ISAUL PIETRANGELO is a 67 y.o. male present for OV for  Dyslipidemia associated with type 2 diabetes mellitus (HCC)/diabetic peripheral neuropathy/mild  nonproliferative diabetic retinopathy of both eyes/overweight:  Stable Continue janumet [phone] daily Continue farixga 10 mg daily Continue Lantus 65 units   PNA series: Completed after 63 Flu shot:administered today Foot exam: 07/14/2023 Eye exam:  Completed UTD 07/13/2022> set up for [facility] eye exam sept. 17th 2024 A1c:10.4>>>9.4>>  9.7>>11.8>> 8.5 >>> 6.5 >>>7.1>7.6>7.7>8.7> 8.8 on 04/20/2021>8.3 > 8.7 > 7.4 > 7.6> 8.2 > 6.8> 7.0 today  Anxiety/depression/insomnia: Stable Has a history of significant depression with anxiety and paranoid features. Continue Valium 4 times daily Continue trazodone to 100 mg daily Meds tried in the past:  BuSpar patient reports it was not effective.  Paxil patient reports made him more anxious. -  [place] controlled substance database reviewed today  Patient aware he needs a face-to-face appointment in order for any refills to be processed -Contract signed -Offered referral to counseling or psychiatry; he declined.    Hypertension/CAD/S/p Stent/prior MI/hypercholesteremia/aortic stenosis/aortic dilatation/LVH/bradycardia:  Pt has a significant cardiac history of "anterior MI in 2004 with stents was LAD and then stents to a branch vessel 2008 (by DR. Renaldo- cardio note 2015)" Continue lisinopril to 20 mg.  STOP metoprolol (brady w/ lowest dose coreg also 1/2 tab) -Continue aspirin 81 mg daily.  Patient has been counseled on baby aspirin use benefits.  Cardiology discontinued Plavix  Continue Crestor 40 mg daily Diet: Low-sodium Exercise: Routine exercise daily - REFERRED BACK to cardio, he has not returned in a few years. He desires transfer to [facility] for transportation convenience.  Labs due next visit.   Influenza vaccine administered today  Reviewed expectations re: course of current medical issues. Discussed self-management of symptoms. Outlined signs and symptoms indicating need for more acute intervention. Patient verbalized  understanding and all questions were answered. Patient received an After-Visit Summary.    Orders Placed This Encounter  Procedures   Flu Vaccine QUAD High Dose(Fluad)   Urine Microalbumin w/creat. ratio   Ambulatory referral to Cardiology   POCT HgB A1C    Meds ordered this encounter  Medications   dapagliflozin propanediol (FARXIGA) 10 MG TABS tablet    Sig: TAKE 1 TABLET BY MOUTH  DAILY BEFORE BREAKFAST    Dispense:  90 tablet    Refill:  1    Requesting 1 year supply> no   diazepam (VALIUM) 5 MG tablet    Sig: Take 1 tablet (5 mg total) by mouth every 6 (six) hours as needed for anxiety.    Dispense:  120 tablet    Refill:  5   insulin glargine (LANTUS SOLOSTAR) 100 UNIT/ML Solostar Pen    Sig: Inject 65 Units into the skin daily.    Dispense:  60 mL    Refill:  1    USE THIS SCRIPT PLEASE!!! DC ALL THE other scripts please.   JANUMET XR [phone] MG TB24    Sig: Take 1 tablet by mouth daily.    Dispense:  90 tablet    Refill:  1   lisinopril (ZESTRIL) 20 MG tablet    Sig: Take 1 tablet (20 mg total) by mouth daily.    Dispense:  90 tablet    Refill:  1    DISCONTINUE combo lisinopril-hctz please. 3rd request   naproxen (NAPROSYN) 500 MG tablet    Sig: Every 12 hours with food as needed only    Dispense:  180 tablet    Refill:  1   traZODone (DESYREL) 100 MG tablet    Sig: Take 1 tablet (100 mg total) by mouth at bedtime as needed for sleep.    Dispense:  90 tablet    Refill:  1     Referral Orders         Ambulatory referral to Cardiology       Note is dictated utilizing voice recognition software. Although note has been proof read prior to signing, occasional typographical errors still can be missed. If any questions arise, please do not hesitate to call for verification.   electronically signed by:  Felix Pacini, DO  [facility] [place]

## 2023-07-14 NOTE — Patient Instructions (Addendum)
Return in about 24 weeks (around 12/29/2023) for cpe (40 min), Routine chronic condition follow-up.   Sept 17 th  at 3:00 pm eye appt here in our office.      Great to see you today.  I have refilled the medication(s) we provide.   If labs were collected or images ordered, we will inform you of  results once we have received them and reviewed. We will contact you either by echart message, or telephone call.  Please give ample time to the testing facility, and our office to run,  receive and review results. Please do not call inquiring of results, even if you can see them in your chart. We will contact you as soon as we are able. If it has been over 1 week since the test was completed, and you have not yet heard from [place], then please call us.    - echart message- for normal results that have been seen by the patient already.   - telephone call: abnormal results or if patient has not viewed results in their echart.  If a referral to a specialist was entered for you, please call us in 2 weeks if you have not heard from the specialist office to schedule.

## 2023-07-18 ENCOUNTER — Telehealth: Payer: Self-pay | Admitting: Cardiology

## 2023-07-18 NOTE — Telephone Encounter (Signed)
Patient wants a provider switch from Dr. Jens Som in [place] to Dr. Wyline Mood in [place].  Please confirm.

## 2023-07-24 ENCOUNTER — Other Ambulatory Visit: Payer: Self-pay | Admitting: Family Medicine

## 2023-07-25 NOTE — Telephone Encounter (Signed)
Patient wants to switch again to a dr a [facility]. He states that [facility] is closer to his home. Please advise

## 2023-07-27 NOTE — Addendum Note (Signed)
Addended by: Filomena Jungling on: 07/27/2023 02:19 PM   Modules accepted: Orders

## 2023-08-01 ENCOUNTER — Encounter: Payer: Self-pay | Admitting: Family Medicine

## 2023-08-21 ENCOUNTER — Ambulatory Visit (INDEPENDENT_AMBULATORY_CARE_PROVIDER_SITE_OTHER): Payer: 59

## 2023-08-21 VITALS — BP 98/58 | HR 68 | Temp 97.7°F | Ht 71.0 in | Wt 208.2 lb

## 2023-08-21 DIAGNOSIS — Z0000 Encounter for general adult medical examination without abnormal findings: Secondary | ICD-10-CM

## 2023-08-21 NOTE — Progress Notes (Signed)
Subjective:   [name] is a 67 y.o. male who presents for Medicare Annual/Subsequent preventive examination.  Visit Complete: In person  Patient Medicare AWV questionnaire was completed by the patient on 08/21/23; I have confirmed that all information answered by patient is correct and no changes since this date.  Cardiac Risk Factors include: advanced age (>67men, >53 women);diabetes mellitus;male gender;obesity (BMI >30kg/m2)     Objective:    Today's Vitals   08/21/23 1312  BP: (!) 98/58  Pulse: 68  Temp: 97.7 F (36.5 C)  SpO2: 96%  Weight: 208 lb 3.2 oz (94.4 kg)  Height: 5\' 11"  (1.803 m)   Body mass index is 29.04 kg/m.     08/21/2023    1:08 PM 08/18/2022    4:42 PM 08/18/2021    8:35 AM 12/21/2015    1:11 PM  Advanced Directives  Does Patient Have a Medical Advance Directive? No No No No  Would patient like information on creating a medical advance directive? No - Patient declined No - Patient declined No - Patient declined     Current Medications (verified) Outpatient Encounter Medications as of 08/21/2023  Medication Sig   Accu-Chek FastClix Lancets MISC    aspirin EC 81 MG tablet Take 1 tablet (81 mg total) by mouth daily.   dapagliflozin propanediol (FARXIGA) 10 MG TABS tablet TAKE 1 TABLET BY MOUTH  DAILY BEFORE BREAKFAST   diazepam (VALIUM) 5 MG tablet Take 1 tablet (5 mg total) by mouth every 6 (six) hours as needed for anxiety.   glucose blood (ONETOUCH ULTRA) test strip CHECK BLOOD SUGAR AS DIRECTED   GNP ULTICARE PEN NEEDLES 31G X 5 MM MISC USE AS DIRECTED once daily   insulin glargine (LANTUS SOLOSTAR) 100 UNIT/ML Solostar Pen Inject 65 Units into the skin daily.   JANUMET XR [id number] MG TB24 Take 1 tablet by mouth daily.   lisinopril (ZESTRIL) 20 MG tablet Take 1 tablet (20 mg total) by mouth daily.   naproxen (NAPROSYN) 500 MG tablet Every 12 hours with food as needed only   nitroGLYCERIN (NITROSTAT) 0.4 MG SL tablet DISSOLVE 1 TABLET UNDER  THE TONGUE EVERY 5 MINUTES AS  NEEDED FOR CHEST PAIN. MAX  OF 3 TABLETS IN 15 MINUTES. CALL 911 IF PAIN PERSISTS.   omega-3 acid ethyl esters (LOVAZA) 1 g capsule Take 1 capsule (1 g total) by mouth daily.   rosuvastatin (CRESTOR) 40 MG tablet Take 1 tablet (40 mg total) by mouth at bedtime.   traZODone (DESYREL) 100 MG tablet Take 1 tablet (100 mg total) by mouth at bedtime as needed for sleep.   No facility-administered encounter medications on file as of 08/21/2023.    Allergies (verified) Gabapentin, Hydrocodone, Nsaids, Prednisone, Tramadol, and Penicillins   History: Past Medical History:  Diagnosis Date   Alcohol addiction (HCC)    Anxiety    Arthritis    Bulging of lumbar intervertebral disc 2017   MRI: L3-4, L4-5 and L5-S1- facet hypertrophy and formainal stenosis   CAD (coronary artery disease)    Coronary artery disease with hx of myocardial infarct w/o hx of CABG 2004   Depression    Dextrocardia    Diabetes (HCC)    Heart attack (HCC) 2004   Stent placement 2008   Hyperlipidemia    Hypertension    Insomnia    Past Surgical History:  Procedure Laterality Date   CORONARY ANGIOPLASTY WITH STENT PLACEMENT  2008   Family History  Problem Relation Age of  Onset   Heart attack Mother    Heart disease Mother    Diabetes Mother    Arthritis Mother    Hyperlipidemia Mother    Hypertension Mother    Breast cancer Mother    Heart attack Father    Heart disease Father    Alcohol abuse Father    Early death Father    Arthritis Sister    Heart disease Sister    Hyperlipidemia Sister    Asthma Sister    COPD Sister    Diabetes Sister    Alcohol abuse Sister    Social History   Socioeconomic History   Marital status: Single    Spouse name: Not on file   Number of children: 0   Years of education: HS   Highest education level: Not on file  Occupational History   Occupation: Full time    Employer: UNEMPLOYED  Tobacco Use   Smoking status: Never   Smokeless  tobacco: Never  Vaping Use   Vaping status: Never Used  Substance and Sexual Activity   Alcohol use: Yes    Alcohol/week: 0.0 standard drinks of alcohol    Comment: Occasional   Drug use: Not Currently    Types: Cocaine, Marijuana    Comment: Former   Sexual activity: Not Currently    Partners: Female  Other Topics Concern   Not on file  Social History Narrative   Marital status/children/pets: Single   Education/employment: HS grad, 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:      -smoke alarm in the home:Yes     - wears seatbelt: Yes      Social Determinants of Health   Financial Resource Strain: Low Risk  (08/21/2023)   Overall Financial Resource Strain (CARDIA)    Difficulty of Paying Living Expenses: Not hard at all  Food Insecurity: No Food Insecurity (08/21/2023)   Hunger Vital Sign    Worried About Running Out of Food in the Last Year: Never true    Ran Out of Food in the Last Year: Never true  Transportation Needs: No Transportation Needs (08/21/2023)   PRAPARE - Administrator, Civil Service (Medical): No    Lack of Transportation (Non-Medical): No  Physical Activity: Insufficiently Active (08/21/2023)   Exercise Vital Sign    Days of Exercise per Week: 3 days    Minutes of Exercise per Session: 20 min  Stress: No Stress Concern Present (08/21/2023)   [employer] of Occupational Health - Occupational Stress Questionnaire    Feeling of Stress : Not at all  Social Connections: Socially Isolated (08/21/2023)   Social Connection and Isolation Panel [NHANES]    Frequency of Communication with Friends and Family: More than three times a week    Frequency of Social Gatherings with Friends and Family: Once a week    Attends Religious Services: Never    Database administrator or Organizations: No    Attends Engineer, structural: Never    Marital Status: Divorced    Tobacco Counseling Counseling given: Not Answered   Clinical Intake:  Pre-visit  preparation completed: No  Pain : No/denies pain     Diabetes: Yes CBG done?: No Did pt. bring in CBG monitor from home?: No  How often do you need to have someone help you when you read instructions, pamphlets, or other written materials from your doctor or pharmacy?: 1 - Never  Interpreter Needed?: No      Activities of Daily Living  08/21/2023    1:04 PM  In your present state of health, do you have any difficulty performing the following activities:  Hearing? 0  Vision? 0  Difficulty concentrating or making decisions? 0  Walking or climbing stairs? 0  Dressing or bathing? 0  Doing errands, shopping? 0  Preparing Food and eating ? N  Using the Toilet? N  In the past six months, have you accidently leaked urine? N  Do you have problems with loss of bowel control? N  Managing your Medications? N  Managing your Finances? N  Housekeeping or managing your Housekeeping? N    Patient Care Team: Natalia Leatherwood, DO as PCP - General (Family Medicine) Rennis Chris, MD as Consulting Physician (Ophthalmology) Fleeta Emmer, RN as [facility] Care Management Branch, Dorothe Pea, MD as Consulting Physician (Cardiology)  Indicate any recent Medical Services you may have received from other than [facility] providers in the past year (date may be approximate).     Assessment:   This is a routine wellness examination for [name].  Hearing/Vision screen No results found.   Goals Addressed   None   Depression Screen    08/21/2023    1:04 PM 03/24/2023    1:35 PM 12/09/2022    1:43 PM 12/05/2022    9:54 AM 08/19/2022   12:57 PM 11/10/2021    2:10 PM 08/18/2021    8:38 AM  PHQ 2/9 Scores  PHQ - 2 Score 0 0 2 1 0 0 2  PHQ- 9 Score   9    4    Fall Risk    08/21/2023    1:03 PM 07/13/2023    1:26 PM 03/24/2023    1:35 PM 08/19/2022   12:57 PM 11/10/2021    2:10 PM  Fall Risk   Falls in the past year? 1 0 0 1 0  Number falls in past yr: 0  0 0 0  Injury with  Fall? 0  0 0 0  Risk for fall due to : No Fall Risks   History of fall(s) No Fall Risks  Follow up Falls evaluation completed  Falls evaluation completed Falls evaluation completed Falls evaluation completed    MEDICARE RISK AT HOME: Medicare Risk at Home Any stairs in or around the home?: Yes If so, are there any without handrails?: No Home free of loose throw rugs in walkways, pet beds, electrical cords, etc?: Yes Adequate lighting in your home to reduce risk of falls?: Yes Life alert?: No Use of a cane, walker or w/c?: No Grab bars in the bathroom?: No Shower chair or bench in shower?: No Elevated toilet seat or a handicapped toilet?: Yes  TIMED UP AND GO:  Was the test performed?  Yes  Length of time to ambulate 10 feet: 5 sec Gait steady and fast without use of assistive device    Cognitive Function:        08/21/2023    1:08 PM 08/19/2022   12:58 PM  6CIT Screen  What Year? 0 points 0 points  What month? 0 points 0 points  What time? 0 points 0 points  Count back from 20 0 points 0 points  Months in reverse 0 points 0 points  Repeat phrase 0 points 0 points  Total Score 0 points 0 points    Immunizations Immunization History  Administered Date(s) Administered   Fluad Quad(high Dose 65+) 12/30/2019, 09/23/2020, 08/11/2021, 08/09/2022   Fluad Trivalent(High Dose 65+) 07/14/2023   Influenza,inj,Quad PF,6+  Mos 08/30/2017, 11/29/2018   Influenza,inj,quad, With Preservative 08/30/2017   Influenza-Unspecified 12/01/2015, 11/28/2016   PFIZER(Purple Top)SARS-COV-2 Vaccination 01/12/2020, 02/04/2020   Pneumococcal Conjugate-13 02/12/2019   Pneumococcal Polysaccharide-23 03/28/2014, 12/30/2019   Tdap 04/16/2015   Zoster Recombinant(Shingrix) 02/12/2019    TDAP status: Up to date  Flu Vaccine status: Up to date  Pneumococcal vaccine status: Up to date  Covid-19 vaccine status: Completed vaccines  Qualifies for Shingles Vaccine? Yes   Zostavax completed No    Shingrix Completed?: No.    Education has been provided regarding the importance of this vaccine. Patient has been advised to call insurance company to determine out of pocket expense if they have not yet received this vaccine. Advised may also receive vaccine at local pharmacy or Health Dept. Verbalized acceptance and understanding.  Screening Tests Health Maintenance  Topic Date Due   OPHTHALMOLOGY EXAM  07/14/2023   Zoster Vaccines- Shingrix (2 of 2) 08/02/2024 (Originally 04/09/2019)   HEMOGLOBIN A1C  01/14/2024   Diabetic kidney evaluation - eGFR measurement  05/30/2024   Diabetic kidney evaluation - Urine ACR  07/13/2024   FOOT EXAM  07/13/2024   Medicare Annual Wellness (AWV)  08/20/2024   DTaP/Tdap/Td (2 - Td or Tdap) 04/15/2025   Fecal DNA (Cologuard)  12/06/2025   Pneumonia Vaccine 3+ Years old  Completed   INFLUENZA VACCINE  Completed   Hepatitis C Screening  Completed   HPV VACCINES  Aged Out   COVID-19 Vaccine  Discontinued    Health Maintenance  Health Maintenance Due  Topic Date Due   OPHTHALMOLOGY EXAM  07/14/2023    Colorectal cancer screening: Type of screening: Cologuard. Completed 12/06/22. Repeat every 3 years  Lung Cancer Screening: (Low Dose CT Chest recommended if Age 33-80 years, 20 pack-year currently smoking OR have quit w/in 15years.) does not qualify.   Lung Cancer Screening Referral: n/a  Additional Screening:  Hepatitis C Screening: does qualify; Completed 01/16/2017  Vision Screening: Recommended annual ophthalmology exams for early detection of glaucoma and other disorders of the eye. Is the patient up to date with their annual eye exam?  No  Who is the provider or what is the name of the office in which the patient attends annual eye exams? [facility] - [place] If pt is not established with a provider, would they like to be referred to a provider to establish care? No .   Dental Screening: Recommended annual dental exams for proper oral  hygiene  Diabetic Foot Exam: Diabetic Foot Exam: Completed 07/14/23  Community Resource Referral / Chronic Care Management: CRR required this visit?  No   CCM required this visit?  No     Plan:     I have personally reviewed and noted the following in the patient's chart:   Medical and social history Use of alcohol, tobacco or illicit drugs  Current medications and supplements including opioid prescriptions. Patient is not currently taking opioid prescriptions. Functional ability and status Nutritional status Physical activity Advanced directives List of other physicians Hospitalizations, surgeries, and ER visits in previous 12 months Vitals Screenings to include cognitive, depression, and falls Referrals and appointments  In addition, I have reviewed and discussed with patient certain preventive protocols, quality metrics, and best practice recommendations. A written personalized care plan for preventive services as well as general preventive health recommendations were provided to patient.     Filomena Jungling, CMA   08/21/2023   After Visit Summary: (In Person-Declined) Patient declined AVS at this time.  Nurse Notes:  Non-Face to Face or Face to Face 10 minute visit Encounter   [name] , Thank you for taking time to come for your Medicare Wellness Visit. I appreciate your ongoing commitment to your health goals. Please review the following plan we discussed and let me know if I can assist you in the future.   These are the goals we discussed:  Goals       Diabetes Management (pt-stated)      Patient Goals/Self Care Activities: -Patient/Caregiver will self-administer medications as prescribed as evidenced by self-report/primary caregiver report  -Patient/Caregiver will attend all scheduled provider appointments as evidenced by clinician review of documented attendance to scheduled appointments and patient/caregiver report -Patient/Caregiver will call provider office  for new concerns or questions as evidenced by review of documented incoming telephone call notes and patient report  -check blood sugar at prescribed times -check blood sugar if I feel it is too high or too low -record values and write them down take them to all doctor visits  -limit carbohydrates   Patient doing okay.  PCP appointment on tomorrow.  Blood sugars running 140-150 range.  Last Blood pressure check 120/60.  Discussed continued diabetes and HTN management. No concerns.          Patient Stated      Would like to get A1C down        This is a list of the screening recommended for you and due dates:  Health Maintenance  Topic Date Due   Eye exam for diabetics  07/14/2023   Zoster (Shingles) Vaccine (2 of 2) 08/02/2024*   Hemoglobin A1C  01/14/2024   Yearly kidney function blood test for diabetes  05/30/2024   Yearly kidney health urinalysis for diabetes  07/13/2024   Complete foot exam   07/13/2024   Medicare Annual Wellness Visit  08/20/2024   DTaP/Tdap/Td vaccine (2 - Td or Tdap) 04/15/2025   Cologuard (Stool DNA test)  12/06/2025   Pneumonia Vaccine  Completed   Flu Shot  Completed   Hepatitis C Screening  Completed   HPV Vaccine  Aged Out   COVID-19 Vaccine  Discontinued  *Topic was postponed. The date shown is not the original due date.

## 2023-08-21 NOTE — Patient Instructions (Signed)
Health Maintenance, Male Adopting a healthy lifestyle and getting preventive care are important in promoting health and wellness. Ask your health care provider about: The right schedule for you to have regular tests and exams. Things you can do on your own to prevent diseases and keep yourself healthy. What should I know about diet, weight, and exercise? Eat a healthy diet  Eat a diet that includes plenty of vegetables, fruits, low-fat dairy products, and lean protein. Do not eat a lot of foods that are high in solid fats, added sugars, or sodium. Maintain a healthy weight Body mass index (BMI) is a measurement that can be used to identify possible weight problems. It estimates body fat based on height and weight. Your health care provider can help determine your BMI and help you achieve or maintain a healthy weight. Get regular exercise Get regular exercise. This is one of the most important things you can do for your health. Most adults should: Exercise for at least 150 minutes each week. The exercise should increase your heart rate and make you sweat (moderate-intensity exercise). Do strengthening exercises at least twice a week. This is in addition to the moderate-intensity exercise. Spend less time sitting. Even light physical activity can be beneficial. Watch cholesterol and blood lipids Have your blood tested for lipids and cholesterol at 69 years of age, then have this test every 5 years. You may need to have your cholesterol levels checked more often if: Your lipid or cholesterol levels are high. You are older than 69 years of age. You are at high risk for heart disease. What should I know about cancer screening? Many types of cancers can be detected early and may often be prevented. Depending on your health history and family history, you may need to have cancer screening at various ages. This may include screening for: Colorectal cancer. Prostate cancer. Skin cancer. Lung  cancer. What should I know about heart disease, diabetes, and high blood pressure? Blood pressure and heart disease High blood pressure causes heart disease and increases the risk of stroke. This is more likely to develop in people who have high blood pressure readings or are overweight. Talk with your health care provider about your target blood pressure readings. Have your blood pressure checked: Every 3-5 years if you are 18-39 years of age. Every year if you are 40 years old or older. If you are between the ages of 65 and 75 and are a current or former smoker, ask your health care provider if you should have a one-time screening for abdominal aortic aneurysm (AAA). Diabetes Have regular diabetes screenings. This checks your fasting blood sugar level. Have the screening done: Once every three years after age 45 if you are at a normal weight and have a low risk for diabetes. More often and at a younger age if you are overweight or have a high risk for diabetes. What should I know about preventing infection? Hepatitis B If you have a higher risk for hepatitis B, you should be screened for this virus. Talk with your health care provider to find out if you are at risk for hepatitis B infection. Hepatitis C Blood testing is recommended for: Everyone born from 1945 through 1965. Anyone with known risk factors for hepatitis C. Sexually transmitted infections (STIs) You should be screened each year for STIs, including gonorrhea and chlamydia, if: You are sexually active and are younger than 69 years of age. You are older than 69 years of age and your   health care provider tells you that you are at risk for this type of infection. Your sexual activity has changed since you were last screened, and you are at increased risk for chlamydia or gonorrhea. Ask your health care provider if you are at risk. Ask your health care provider about whether you are at high risk for HIV. Your health care provider  may recommend a prescription medicine to help prevent HIV infection. If you choose to take medicine to prevent HIV, you should first get tested for HIV. You should then be tested every 3 months for as long as you are taking the medicine. Follow these instructions at home: Alcohol use Do not drink alcohol if your health care provider tells you not to drink. If you drink alcohol: Limit how much you have to 0-2 drinks a day. Know how much alcohol is in your drink. In the [place] one drink equals one 12 oz bottle of beer (355 mL), one 5 oz glass of wine (148 mL), or one 1 oz glass of hard liquor (44 mL). Lifestyle Do not use any products that contain nicotine or tobacco. These products include cigarettes, chewing tobacco, and vaping devices, such as e-cigarettes. If you need help quitting, ask your health care provider. Do not use street drugs. Do not share needles. Ask your health care provider for help if you need support or information about quitting drugs. General instructions Schedule regular health, dental, and eye exams. Stay current with your vaccines. Tell your health care provider if: You often feel depressed. You have ever been abused or do not feel safe at home. Summary Adopting a healthy lifestyle and getting preventive care are important in promoting health and wellness. Follow your health care provider's instructions about healthy diet, exercising, and getting tested or screened for diseases. Follow your health care provider's instructions on monitoring your cholesterol and blood pressure. This information is not intended to replace advice given to you by your health care provider. Make sure you discuss any questions you have with your health care provider. Document Revised: 04/05/2021 Document Reviewed: 04/05/2021 Elsevier Patient Education  2024 [employer]vier Inc.  

## 2023-09-04 ENCOUNTER — Ambulatory Visit: Payer: 59 | Admitting: Cardiology

## 2023-09-11 ENCOUNTER — Ambulatory Visit: Payer: Self-pay

## 2023-09-11 NOTE — Patient Instructions (Signed)
Visit Information  Thank you for taking time to visit with me today. Please don't hesitate to contact me if I can be of assistance to you.   Following are the goals we discussed today:   Goals Addressed               This Visit's Progress     Diabetes Management (pt-stated)        Patient Goals/Self Care Activities: -Patient/Caregiver will self-administer medications as prescribed as evidenced by self-report/primary caregiver report  -Patient/Caregiver will attend all scheduled provider appointments as evidenced by clinician review of documented attendance to scheduled appointments and patient/caregiver report -Patient/Caregiver will call provider office for new concerns or questions as evidenced by review of documented incoming telephone call notes and patient report  -check blood sugar at prescribed times -check blood sugar if I feel it is too high or too low -record values and write them down take them to all doctor visits  -limit carbohydrates   Patient doing alright nothing to complain about.  Blood sugars running 140.  Last Blood pressure check 110/60 during house calls visit.  Encouraged continued diabetes and HTN management. No concerns.            If you are experiencing a Mental Health or Behavioral Health Crisis or need someone to talk to, please call the Suicide and Crisis Lifeline: 988   Patient verbalizes understanding of instructions and care plan provided today and agrees to view in MyChart. Active MyChart status and patient understanding of how to access instructions and care plan via MyChart confirmed with patient.     The patient has been provided with contact information for the care management team and has been advised to call with any health related questions or concerns.   Bary Leriche, RN, MSN [facility], [facility] Management Community Coordinator Direct Dial: [phone]  Fax: [phone] Website:  [email or web address]

## 2023-09-11 NOTE — Patient Outreach (Signed)
Care Coordination   Follow Up Visit Note   09/11/2023 Name: [name] MRN: [id number] DOB: [birth year]  [name] is a 67 y.o. year old male who sees Kuneff, Renee A, DO for primary care. I spoke with  Nada Boozer Nemmers by phone today.  What matters to the patients health and wellness today?  Maintaining health    Goals Addressed               This Visit's Progress     Diabetes Management (pt-stated)        Patient Goals/Self Care Activities: -Patient/Caregiver will self-administer medications as prescribed as evidenced by self-report/primary caregiver report  -Patient/Caregiver will attend all scheduled provider appointments as evidenced by clinician review of documented attendance to scheduled appointments and patient/caregiver report -Patient/Caregiver will call provider office for new concerns or questions as evidenced by review of documented incoming telephone call notes and patient report  -check blood sugar at prescribed times -check blood sugar if I feel it is too high or too low -record values and write them down take them to all doctor visits  -limit carbohydrates   Patient doing alright nothing to complain about.  Blood sugars running 140.  Last Blood pressure check 110/60 during house calls visit.  Encouraged continued diabetes and HTN management. No concerns.            SDOH assessments and interventions completed:  Yes  SDOH Interventions Today    Flowsheet Row Most Recent Value  SDOH Interventions   Housing Interventions Intervention Not Indicated        Care Coordination Interventions:  Yes, provided   Follow up plan: Follow up call scheduled for December    Encounter Outcome:  Patient Visit Completed   Bary Leriche, RN, MSN [place]  [facility], [facility] Management Community Coordinator Direct Dial: [phone]  Fax: [phone] Website: [email or web address]

## 2023-10-04 ENCOUNTER — Encounter: Payer: 59 | Admitting: Family Medicine

## 2023-10-19 ENCOUNTER — Other Ambulatory Visit: Payer: Self-pay

## 2023-10-19 NOTE — Telephone Encounter (Signed)
Received fax for medication transfer. Mail order pharmacy requesting new Rx for pended medications. Pharmacy is updated. CPE scheduled for Dec 09,2024

## 2023-10-24 MED ORDER — LANTUS SOLOSTAR 100 UNIT/ML ~~LOC~~ SOPN: 65.0000 [IU] | PEN_INJECTOR | Freq: Every day | SUBCUTANEOUS | 1 refills | Status: DC

## 2023-10-24 MED ORDER — LISINOPRIL 20 MG PO TABS: 20.0000 mg | ORAL_TABLET | Freq: Every day | ORAL | 1 refills | Status: DC

## 2023-10-24 MED ORDER — DAPAGLIFLOZIN PROPANEDIOL 10 MG PO TABS: ORAL_TABLET | ORAL | 1 refills | Status: DC

## 2023-10-24 MED ORDER — JANUMET XR 100-1000 MG PO TB24: 1.0000 | ORAL_TABLET | Freq: Every day | ORAL | 1 refills | Status: DC

## 2023-10-24 NOTE — Telephone Encounter (Signed)
Patient called about status of prescriptions being faxed to new pharmacy. I told [name] that prescriptions are "pending". Dr.Kuneff will return on Monday. 12/2. He is not out of any of his meds. New pharmacy has been updated. [facility] deleted.  Patient has CPE appt scheduled for 11/06/23.

## 2023-10-24 NOTE — Telephone Encounter (Signed)
Pt advised.

## 2023-10-24 NOTE — Telephone Encounter (Signed)
I refilled the requested medications to his mail-in pharmacy.  He will need to keep his appointment that is coming up.  I cannot refill the Valium until we see him in person.

## 2023-10-30 ENCOUNTER — Telehealth: Payer: Self-pay | Admitting: Family Medicine

## 2023-10-30 MED ORDER — ROSUVASTATIN CALCIUM 40 MG PO TABS: 40.0000 mg | ORAL_TABLET | Freq: Every day | ORAL | 0 refills | Status: DC

## 2023-10-30 NOTE — Telephone Encounter (Signed)
Refill sent.

## 2023-10-30 NOTE — Telephone Encounter (Signed)
[name] with [name] called to request [name] 40 mg be sent electronically. The fax number is [phone] and phone number is [phone]

## 2023-11-06 ENCOUNTER — Encounter: Payer: Self-pay | Admitting: Family Medicine

## 2023-11-06 ENCOUNTER — Ambulatory Visit: Payer: 59 | Admitting: Family Medicine

## 2023-11-06 VITALS — BP 132/74 | HR 56 | Temp 97.8°F | Ht 71.0 in | Wt 213.0 lb

## 2023-11-06 DIAGNOSIS — I10 Essential (primary) hypertension: Secondary | ICD-10-CM | POA: Diagnosis not present

## 2023-11-06 DIAGNOSIS — R001 Bradycardia, unspecified: Secondary | ICD-10-CM | POA: Diagnosis not present

## 2023-11-06 DIAGNOSIS — F5101 Primary insomnia: Secondary | ICD-10-CM

## 2023-11-06 DIAGNOSIS — E1142 Type 2 diabetes mellitus with diabetic polyneuropathy: Secondary | ICD-10-CM

## 2023-11-06 DIAGNOSIS — E113293 Type 2 diabetes mellitus with mild nonproliferative diabetic retinopathy without macular edema, bilateral: Secondary | ICD-10-CM | POA: Diagnosis not present

## 2023-11-06 DIAGNOSIS — Z125 Encounter for screening for malignant neoplasm of prostate: Secondary | ICD-10-CM

## 2023-11-06 DIAGNOSIS — I77810 Thoracic aortic ectasia: Secondary | ICD-10-CM

## 2023-11-06 DIAGNOSIS — E1169 Type 2 diabetes mellitus with other specified complication: Secondary | ICD-10-CM | POA: Diagnosis not present

## 2023-11-06 DIAGNOSIS — F411 Generalized anxiety disorder: Secondary | ICD-10-CM

## 2023-11-06 DIAGNOSIS — E785 Hyperlipidemia, unspecified: Secondary | ICD-10-CM | POA: Diagnosis not present

## 2023-11-06 DIAGNOSIS — I517 Cardiomegaly: Secondary | ICD-10-CM

## 2023-11-06 DIAGNOSIS — Z0000 Encounter for general adult medical examination without abnormal findings: Secondary | ICD-10-CM

## 2023-11-06 DIAGNOSIS — I350 Nonrheumatic aortic (valve) stenosis: Secondary | ICD-10-CM

## 2023-11-06 DIAGNOSIS — Z955 Presence of coronary angioplasty implant and graft: Secondary | ICD-10-CM

## 2023-11-06 DIAGNOSIS — I2510 Atherosclerotic heart disease of native coronary artery without angina pectoris: Secondary | ICD-10-CM

## 2023-11-06 DIAGNOSIS — H9193 Unspecified hearing loss, bilateral: Secondary | ICD-10-CM

## 2023-11-06 MED ORDER — DIAZEPAM 5 MG PO TABS: 5.0000 mg | ORAL_TABLET | Freq: Four times a day (QID) | ORAL | 5 refills | Status: DC | PRN

## 2023-11-06 NOTE — Progress Notes (Signed)
[name] , [birth year], 67 y.o., male MRN: [id number] Patient Care Team    Relationship Specialty Notifications Start End  Natalia Leatherwood, DO PCP - General Family Medicine  11/29/18   Rennis Chris, MD Consulting Physician Ophthalmology  01/09/19   Antoine Poche, MD Consulting Physician Cardiology  07/19/23     Chief Complaint  Patient presents with   Annual Exam    Pt is not fasting; reports having eye exam in [place]     Subjective: Pt presents for an OV for annual physical exam and combined chronic condition management appointment.  All past medical history, family history, medications were updated today.  Health maintenance: Colonoscopy: cologuard negative 02/24/2019- 3 yr rpt.  Immunizations:  tdap UTD 03/2015, influenza UTD 2024, PNA series completed, shingrix 01/2019, Infectious disease screening: HIV and  Hep C completed PSA: Patient was counseled on PSA screenings Lab Results  Component Value Date   PSA 1.02 02/13/2020   PSA 1.07 02/12/2019    Diabetes with complications/overweight:  Patient reports he has been a diabetic since 1998.   He reports with compliance w/  Janumet/Metformin 1000-100 mg daily, Farxiga 10 mg daily and Lantus 65 units daily.  Patient denies dizziness, hyperglycemic or hypoglycemic events. Patient denies numbness, tingling in the extremities or nonhealing wounds of feet.    Depression/anxiety/insomnia: Patient reports he is feeling well with Valium 4 times daily and trazodone 100 mg nightly.   Sleeping well Prior note He had been on BuSpar as well which he originally thought was helpful, but then he felt that it was not as helpful.   [place] controlled substance database reviewed today Original note: He states he has a rather routine day.  He does not have much social contact in a day.  He lives by himself.  He reports that he always feels like he "stays on edge.  "He reports when he wakes up he is already feeling on edge  and he will take a Valium at that time.  He then has breakfast and goes on a 4 mile walk daily.  For lunch he starts to feel his anxiety sudden and he becomes panicked.  He states when he feels this way he will break out in a sweat, become very fearful and scared.  He states he commonly fears there is someone in the house with a gun.  He goes to his bedroom gets in his bed. He states the fear is so intense and he just cannot control it.  He denies any known trauma or event experienced in his life that would create such a fear.  He states he did go see a counselor/psychiatrist at [facility] in the past 6 months, and he did not feel that was helpful for him.  Denies any SI or HI.  Hypertension/CAD/S/p Stent/prior MI:  Pt has a significant cardiac history of "anterior MI in 2004 with stents was LAD and then stents to branch vessel 2008 (by DR. Renaldo-cardio note 2015)"  Patient reports compliance with lisinopril 20 mg daily and metoprolol 25 mg daily, Lovaza, and Crestor.   He is not compliant with baby aspirin 81 mg daily.  Plavix was DC'd by cards.  His statin was increased by cardiology to Crestor 40 mg daily.  Patient denies chest pain, shortness of breath, dizziness or lower extremity edema.   Diet: Low-sodium Exercise: Routine exercise daily RF: Hypertension, hyperlipidemia, diabetes, overweight, family history, CAD, MI   Neck pain/back pain: pt reports back  pain is about the same. The naproxen twice daily as needed is helping Prior note: Patient has had recurrent posterior neck pain and headache.  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  He states he found an old prescription of naproxen and restarted it the last time he had symptoms.  He states it worked well for him.  He is wondering if we could get refills for him today.  In the past he has stated he could not tolerate NSAIDs.     Erectile dysfunction: (Documentation only) patient would like to  start a medication for erectile dysfunction.  He states he seen a urologist many years ago and does not desire to see a urologist again.  Patient has a significant medical history of blood pressure with systolic less than 100 and history of CAD and MI.  Echo 06/08/2021: EF 50-55%.  LVH.  Moderate calcification of aortic valve.  echo 03/06/2020:  1. Technically difficult study. Left ventricular ejection fraction, by  estimation, is 45 to 50%. The left ventricle has grossly mildly decreased  function. Image quality is not sufficient to assess for regional wall  motion abnormalities. Left ventricular   diastolic parameters are indeterminate.   2. Right ventricle is poorly visualized. Grossly normal size and systolic  function. Tricuspid regurgitation signal is inadequate for assessing PA  pressure.   3. The mitral valve is normal in structure. No evidence of mitral valve  regurgitation.   4. Aortic dilatation noted. There is mild dilatation of the ascending  aorta measuring 37 mm.   5. The aortic valve is abnormal. Moderately calcified. Aortic valve  regurgitation is not visualized. Mild aortic valve stenosis. Vmax 2.2 m/s,  MG 10 mmHg, AVA 1.7 cm^2, DI 0.4      08/21/2023    1:04 PM 03/24/2023    1:35 PM 12/09/2022    1:43 PM 12/05/2022    9:54 AM 08/19/2022   12:57 PM  Depression screen PHQ 2/9  Decreased Interest 0 0 2 1 0  Down, Depressed, Hopeless 0 0 0 0 0  PHQ - 2 Score 0 0 2 1 0  Altered sleeping   0    Tired, decreased energy   2    Change in appetite   3    Feeling bad or failure about yourself    0    Trouble concentrating   2    Moving slowly or fidgety/restless   0    Suicidal thoughts   0    PHQ-9 Score   9      Allergies  Allergen Reactions   Gabapentin Other (See Comments)    Paranoia/hallucinations   Hydrocodone Other (See Comments)    PT reports  Confusion when tacking Hydrocodone   Nsaids     History of heart disease, CAD with history of MI, CABG status post  stent   Prednisone     Paranoia   Tramadol    Penicillins Other (See Comments)    Childhood unsure if still allgeric   Social History   Social History Narrative   Marital status/children/pets: Single   Education/employment: HS grad, 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:      -smoke alarm in the home:Yes     - wears seatbelt: Yes      Past Medical History:  Diagnosis Date   Alcohol addiction (HCC)    Anxiety    Arthritis    Bulging of lumbar intervertebral  disc 2017   MRI: L3-4, L4-5 and L5-S1- facet hypertrophy and formainal stenosis   CAD (coronary artery disease)    Coronary artery disease with hx of myocardial infarct w/o hx of CABG 2004   Depression    Dextrocardia    Diabetes (HCC)    Heart attack (HCC) 2004   Stent placement 2008   Hyperlipidemia    Hypertension    Insomnia    Past Surgical History:  Procedure Laterality Date   CORONARY ANGIOPLASTY WITH STENT PLACEMENT  2008   Family History  Problem Relation Age of Onset   Heart attack Mother    Heart disease Mother    Diabetes Mother    Arthritis Mother    Hyperlipidemia Mother    Hypertension Mother    Breast cancer Mother    Heart attack Father    Heart disease Father    Alcohol abuse Father    Early death Father    Arthritis Sister    Heart disease Sister    Hyperlipidemia Sister    Asthma Sister    COPD Sister    Diabetes Sister    Alcohol abuse Sister    Allergies as of 11/06/2023       Reactions   Gabapentin Other (See Comments)   Paranoia/hallucinations   Hydrocodone Other (See Comments)   PT reports  Confusion when tacking Hydrocodone   Nsaids    History of heart disease, CAD with history of MI, CABG status post stent   Prednisone    Paranoia   Tramadol    Penicillins Other (See Comments)   Childhood unsure if still allgeric        Medication List        Accurate as of November 06, 2023  2:02 PM. If you have any questions, ask your nurse or doctor.          Accu-Chek  FastClix Lancets Misc   aspirin EC 81 MG tablet Take 1 tablet (81 mg total) by mouth daily.   dapagliflozin propanediol 10 MG Tabs tablet Commonly known as: Farxiga TAKE 1 TABLET BY MOUTH  DAILY BEFORE BREAKFAST   diazepam 5 MG tablet Commonly known as: VALIUM Take 1 tablet (5 mg total) by mouth every 6 (six) hours as needed for anxiety.   GNP UltiCare Pen Needles 31G X 5 MM Misc Generic drug: Insulin Pen Needle USE AS DIRECTED once daily   Janumet XR [phone] MG Tb24 Generic drug: SitaGLIPtin-MetFORMIN HCl Take 1 tablet by mouth daily.   Lantus SoloStar 100 UNIT/ML Solostar Pen Generic drug: insulin glargine Inject 65 Units into the skin daily.   lisinopril 20 MG tablet Commonly known as: ZESTRIL Take 1 tablet (20 mg total) by mouth daily.   naproxen 500 MG tablet Commonly known as: NAPROSYN Every 12 hours with food as needed only   nitroGLYCERIN 0.4 MG SL tablet Commonly known as: NITROSTAT DISSOLVE 1 TABLET UNDER THE TONGUE EVERY 5 MINUTES AS  NEEDED FOR CHEST PAIN. MAX  OF 3 TABLETS IN 15 MINUTES. CALL 911 IF PAIN PERSISTS.   omega-3 acid ethyl esters 1 g capsule Commonly known as: LOVAZA Take 1 capsule (1 g total) by mouth daily.   OneTouch Ultra test strip Generic drug: glucose blood CHECK BLOOD SUGAR AS DIRECTED   rosuvastatin 40 MG tablet Commonly known as: CRESTOR Take 1 tablet (40 mg total) by mouth at bedtime.   traZODone 100 MG tablet Commonly known as: DESYREL Take 1 tablet (100 mg total) by mouth at  bedtime as needed for sleep.        All past medical history, surgical history, allergies, family history, immunizations andmedications were updated in the EMR today and reviewed under the history and medication portions of their EMR.     ROS: Negative, with the exception of above mentioned in HPI   Objective:  BP 132/74   Pulse (!) 56   Temp 97.8 F (36.6 C)   Ht 5\' 11"  (1.803 m)   Wt 213 lb (96.6 kg)   SpO2 97%   BMI 29.71 kg/m  Body  mass index is 29.71 kg/m. Physical Exam Vitals and nursing note reviewed.  Constitutional:      General: He is not in acute distress.    Appearance: Normal appearance. He is not ill-appearing, toxic-appearing or diaphoretic.  HENT:     Head: Normocephalic and atraumatic.     Right Ear: Tympanic membrane, ear canal and external ear normal. There is no impacted cerumen.     Left Ear: Tympanic membrane, ear canal and external ear normal. There is no impacted cerumen.     Nose: Nose normal. No congestion or rhinorrhea.     Mouth/Throat:     Mouth: Mucous membranes are moist.     Pharynx: Oropharynx is clear. No oropharyngeal exudate or posterior oropharyngeal erythema.  Eyes:     General: No scleral icterus.       Right eye: No discharge.        Left eye: No discharge.     Extraocular Movements: Extraocular movements intact.     Pupils: Pupils are equal, round, and reactive to light.  Cardiovascular:     Rate and Rhythm: Regular rhythm. Bradycardia present.     Pulses: Normal pulses.     Heart sounds: Murmur heard.     No friction rub. No gallop.  Pulmonary:     Effort: Pulmonary effort is normal. No respiratory distress.     Breath sounds: Normal breath sounds. No stridor. No wheezing, rhonchi or rales.  Chest:     Chest wall: No tenderness.  Abdominal:     General: Abdomen is flat. Bowel sounds are normal. There is no distension.     Palpations: Abdomen is soft. There is no mass.     Tenderness: There is no abdominal tenderness. There is no right CVA tenderness, left CVA tenderness, guarding or rebound.     Hernia: No hernia is present.  Musculoskeletal:        General: No swelling or tenderness. Normal range of motion.     Cervical back: Normal range of motion and neck supple.     Right lower leg: No edema.     Left lower leg: No edema.  Lymphadenopathy:     Cervical: No cervical adenopathy.  Skin:    General: Skin is warm and dry.     Coloration: Skin is not jaundiced.      Findings: No bruising, lesion or rash.  Neurological:     General: No focal deficit present.     Mental Status: He is alert and oriented to person, place, and time. Mental status is at baseline.     Cranial Nerves: No cranial nerve deficit.     Sensory: No sensory deficit.     Motor: No weakness.     Coordination: Coordination normal.     Gait: Gait normal.     Deep Tendon Reflexes: Reflexes normal.  Psychiatric:        Mood and Affect: Mood normal.  Behavior: Behavior normal.        Thought Content: Thought content normal.        Judgment: Judgment normal.    Diabetic Foot Exam - Simple   Simple Foot Form Diabetic Foot exam was performed with the following findings: Yes 11/06/2023  1:49 PM  Visual Inspection No deformities, no ulcerations, no other skin breakdown bilaterally: Yes Sensation Testing Intact to touch and monofilament testing bilaterally: Yes Pulse Check Posterior Tibialis and Dorsalis pulse intact bilaterally: Yes Comments     No results found. No results found. No results found for this or any previous visit (from the past 24 hour(s)).  Assessment/Plan: NILAY NIKIRK is a 67 y.o. male present for OV for annual physical and combined chronic condition management appointment Dyslipidemia associated with type 2 diabetes mellitus (HCC)/diabetic peripheral neuropathy/mild nonproliferative diabetic retinopathy of both eyes/overweight:  Stable Continue janumet [phone] daily Continue farixga 10 mg daily Continue Lantus 65 units   PNA series: Completed after 63 Flu shot: UTD 2024 Foot exam: 07/14/2023 Eye exam:  Completed UTD 07/13/2022> set up for [facility] eye exam sept. 17th 2024 A1c:10.4>>>9.4>>  9.7>>11.8>> 8.5 >>> 6.5 >>>7.1>7.6>7.7>8.7> 8.8 on 04/20/2021>8.3 > 8.7 > 7.4 > 7.6> 8.2 > 6.8> 7.0> collected today  Anxiety/depression/insomnia: Stable Has a history of significant depression with anxiety and paranoid features. Continue Valium 4 times  daily Continue trazodone to 100 mg daily Meds tried in the past: BuSpar patient reports it was not effective.  Paxil patient reports made him more anxious. -  [place] controlled substance database reviewed today  Patient aware he needs a face-to-face appointment in order for any refills to be processed -Contract signed -Offered referral to counseling or psychiatry; he declined.  Hypertension/CAD/S/p Stent/prior MI/hypercholesteremia/aortic stenosis/aortic dilatation/LVH/bradycardia:  Pt has a significant cardiac history of "anterior MI in 2004 with stents was LAD and then stents to a branch vessel 2008 (by DR. Renaldo- cardio note 2015)" Continue lisinopril to 20 mg.  STOP metoprolol (brady w/ lowest dose coreg also 1/2 tab) Continue aspirin 81 mg daily.  Patient has been counseled on baby aspirin use benefits.  Cardiology discontinued Plavix  Continue Crestor 40 mg daily Diet: Low-sodium Exercise: Routine exercise daily - REFERRED BACK to cardio, he has not returned in a few years. He [facility] transfer to [facility] for transportation convenience.  Labs due next visit.   Prostate cancer screening - PSA, Medicare  Routine general medical examination at a health care facility - Lipid panel Colonoscopy: cologuard negative 02/24/2019- 3 yr rpt.  Immunizations:  tdap UTD 03/2015, influenza UTD 2024, PNA series completed, shingrix 01/2019, Infectious disease screening: HIV and  Hep C completed PSA: Patient was counseled on PSA screenings Patient was encouraged to exercise greater than 150 minutes a week. Patient was encouraged to choose a diet filled with fresh fruits and vegetables, and lean meats. AVS provided to patient today for education/recommendation on gender specific health and safety maintenance.   Reviewed expectations re: course of current medical issues. Discussed self-management of symptoms. Outlined signs and symptoms indicating need for more acute intervention. Patient  verbalized understanding and all questions were answered. Patient received an After-Visit Summary.    Orders Placed This Encounter  Procedures   CBC   Comprehensive metabolic panel   Hemoglobin A1c   TSH   Lipid panel   PSA, Medicare   Ambulatory referral to Audiology    Meds ordered this encounter  Medications   diazepam (VALIUM) 5 MG tablet    Sig:  Take 1 tablet (5 mg total) by mouth every 6 (six) hours as needed for anxiety.    Dispense:  120 tablet    Refill:  5     Referral Orders         Ambulatory referral to Audiology        Note is dictated utilizing voice recognition software. Although note has been proof read prior to signing, occasional typographical errors still can be missed. If any questions arise, please do not hesitate to call for verification.   electronically signed by:  Felix Pacini, DO  [facility] [place]

## 2023-11-06 NOTE — Patient Instructions (Addendum)

## 2023-11-07 LAB — COMPREHENSIVE METABOLIC PANEL
ALT: 30 U/L (ref 0–53)
AST: 22 U/L (ref 0–37)
Albumin: 4.7 g/dL (ref 3.5–5.2)
Alkaline Phosphatase: 51 U/L (ref 39–117)
BUN: 15 mg/dL (ref 6–23)
CO2: 29 meq/L (ref 19–32)
Calcium: 9.8 mg/dL (ref 8.4–10.5)
Chloride: 103 meq/L (ref 96–112)
Creatinine, Ser: 0.77 mg/dL (ref 0.40–1.50)
GFR: 91.49 mL/min (ref >60.00)
Glucose, Bld: 124 mg/dL — ABNORMAL HIGH (ref 70–99)
Potassium: 4.6 meq/L (ref 3.5–5.1)
Sodium: 140 meq/L (ref 135–145)
Total Bilirubin: 0.3 mg/dL (ref 0.2–1.2)
Total Protein: 7.4 g/dL (ref 6.0–8.3)

## 2023-11-07 LAB — CBC
HCT: 52 % (ref 39.0–52.0)
Hemoglobin: 16.9 g/dL (ref 13.0–17.0)
MCHC: 32.6 g/dL (ref 30.0–36.0)
MCV: 95.3 fL (ref 78.0–100.0)
Platelets: 156 10*3/uL (ref 150.0–400.0)
RBC: 5.45 Mil/uL (ref 4.22–5.81)
RDW: 14.1 % (ref 11.5–15.5)
WBC: 6.2 10*3/uL (ref 4.0–10.5)

## 2023-11-07 LAB — LIPID PANEL
Cholesterol: 109 mg/dL (ref 0–200)
HDL: 40 mg/dL (ref >39.00)
LDL Cholesterol: 45 mg/dL (ref 0–99)
NonHDL: 69.4
Total CHOL/HDL Ratio: 3
Triglycerides: 123 mg/dL (ref 0.0–149.0)
VLDL: 24.6 mg/dL (ref 0.0–40.0)

## 2023-11-07 LAB — HEMOGLOBIN A1C: Hgb A1c MFr Bld: 9 % — ABNORMAL HIGH (ref 4.6–6.5)

## 2023-11-07 LAB — PSA, MEDICARE: PSA: 1.78 ng/mL (ref 0.10–4.00)

## 2023-11-07 LAB — TSH: TSH: 3.86 u[IU]/mL (ref 0.35–5.50)

## 2023-11-08 ENCOUNTER — Telehealth: Payer: Self-pay | Admitting: Family Medicine

## 2023-11-08 NOTE — Telephone Encounter (Signed)
Please give patient a call back regarding lab results.

## 2023-11-08 NOTE — Progress Notes (Signed)
Left Vm for pt to return my call.  

## 2023-11-09 NOTE — Telephone Encounter (Signed)
Spoke with patient regarding results/recommendations.  

## 2023-11-09 NOTE — Telephone Encounter (Signed)
Please call patient Liver, kidney and thyroid function are normal Blood cell counts and electrolytes are normal Diabetes/A1c went up to 9.  Please make sure he is taking his Farxiga 10 mg, Janumet XR [phone] mg, Lantus 65 units daily.  If he has been compliant with his medications, I strongly urged him to follow the diabetic diet closely and increase Lantus to 70 units daily (only increase, If he states he has been taking all the medicines like he is prescribed) Cholesterol panel looks good and is at goal.

## 2023-11-14 ENCOUNTER — Other Ambulatory Visit: Payer: Self-pay | Admitting: *Deleted

## 2023-11-14 ENCOUNTER — Telehealth: Payer: Self-pay

## 2023-11-14 MED ORDER — ONETOUCH ULTRA VI STRP: ORAL_STRIP | 12 refills | Status: DC

## 2023-11-14 NOTE — Patient Outreach (Signed)
Care Management   Visit Note  11/14/2023 Name: [name] MRN: [id number] DOB: 1956-05-03  Subjective: [name] is a 67 y.o. year old male who is a primary care patient of Kuneff, Renee A, DO. The Care Management team was consulted for assistance.      Engaged with patient spoke with patient by telephone.    Goals Addressed             This Visit's Progress    RNCM Care Management Expected Outcomes: Monitor, Self-Mange and Reduce Symptoms of: DM, HTN       Current Barriers:  Knowledge Deficits related to plan of care for management of HTN and DMII  Chronic Disease Management support and education needs related to HTN and DMII   RNCM Clinical Goal(s):  Patient will verbalize basic understanding of  HTN and DMII disease process and self health management plan as evidenced by verbal explanation, recognizing symptoms, lifestyle modifications take all medications exactly as prescribed and will call provider for medication related questions as evidenced by compliance with all medications attend all scheduled medical appointments: with primary care provider and specialist as evidenced by keeping all scheduled appointments demonstrate Improved and Ongoing adherence to prescribed treatment plan for HTN and DMII as evidenced by consistent medication compliance, symptom monitoring and continued lifestyle modifications continue to work with RN Care Manager to address care management and care coordination needs related to  HTN and DMII as evidenced by adherence to CM Team Scheduled appointments through collaboration with RN Care manager, provider, and care team.   Interventions: Evaluation of current treatment plan related to  self management and patient's adherence to plan as established by provider   Diabetes Interventions:  (Status:  New goal. and Goal on track:  NO.) Long Term Goal Assessed patient's understanding of A1c goal: <6.5% Provided education to patient about basic DM  disease process. Does not regularly check blood sugar and had not checked as off 11:45 am today. RNCM advised to start checking his fasting glucose in the morning before he eats and before other meals and record. Does not follow recommended diet, RNCM will add some education in the AVS for patient to review. Reviewed medications with patient and discussed importance of medication adherence. Reports compliance with medications. Counseled on importance of regular laboratory monitoring as prescribed Discussed plans with patient for ongoing care management follow up and provided patient with direct contact information for care management team Provided patient with written educational materials related to hypo and hyperglycemia and importance of correct treatment Reviewed scheduled/upcoming provider appointments including: 02-19-2024 with PCP Advised patient, providing education and rationale, to check cbg fasting in the morning before breakfast and check before meals and record, calling PCP for findings outside established parameters Review of patient status, including review of consultants reports, relevant laboratory and other test results, and medications completed Screening for signs and symptoms of depression related to chronic disease state  Assessed social determinant of health barriers Lab Results  Component Value Date   HGBA1C 9.0 (H) 11/06/2023    Hypertension Interventions:  (Status:  New goal. and Goal on track:  Yes.) Long Term Goal Last practice recorded BP readings:  BP Readings from Last 3 Encounters:  11/06/23 132/74  08/21/23 (!) 98/58  07/14/23 129/73   Most recent eGFR/CrCl: No results found for: "EGFR"  No components found for: "CRCL"  Evaluation of current treatment plan related to hypertension self management and patient's adherence to plan as established by provider. Does not  regularly check blood pressure. RNCM advised to check at least daily and keep a log. Patient  verbalized understanding. Denies any swelling in legs/feet. Provided education to patient re: stroke prevention, s/s of heart attack and stroke Reviewed medications with patient and discussed importance of compliance Counseled on adverse effects of illicit drug and excessive alcohol use in patients with high blood pressure  Counseled on the importance of exercise goals with target of 150 minutes per week Discussed plans with patient for ongoing care management follow up and provided patient with direct contact information for care management team Advised patient, providing education and rationale, to monitor blood pressure daily and record, calling PCP for findings outside established parameters Reviewed scheduled/upcoming provider appointments including:  Advised patient to discuss any new changes with provider Provided education on prescribed diet ADA/Heart Healthy Diet Discussed complications of poorly controlled blood pressure such as heart disease, stroke, circulatory complications, vision complications, kidney impairment, sexual dysfunction Assessed social determinant of health barriers  Patient Goals/Self-Care Activities: Take all medications as prescribed Attend all scheduled provider appointments Call pharmacy for medication refills 3-7 days in advance of running out of medications Attend church or other social activities Perform all self care activities independently  Call provider office for new concerns or questions  schedule appointment with eye doctor check feet daily for cuts, sores or redness keep feet up while sitting check blood pressure daily  Follow Up Plan:  Telephone follow up appointment with care management team member scheduled for:  12-13-2023 at 11:45 am             Consent to Services:  Patient was given information about care management services, agreed to services, and gave verbal consent to participate.   Plan: Telephone follow up appointment with care  management team member scheduled for:12-13-2023 at 11:45 am  Danise Edge, BSN RN RN Care Manager  [facility]  Ambulatory Care Management  Direct Number: [phone]

## 2023-11-14 NOTE — Telephone Encounter (Signed)
Patient stated that [facility] has reached out to [place] several times with no response. Patient is now out of test strips.  Patient needs new prescription for test strips. Current script is expires.  Prescription Request  11/14/2023  LOV: Visit date not found  What is the name of the medication or equipment? glucose blood (ONETOUCH ULTRA) test strip   Have you contacted your pharmacy to request a refill? Yes   Which pharmacy would you like this sent to?  [facility] - [address] [place] - [address] [place] [address] Phone: [phone] Fax: [phone]    Patient notified that their request is being sent to the clinical staff for review and that they should receive a response within 2 business days.   Please advise at Mobile [phone] (mobile)

## 2023-11-14 NOTE — Patient Instructions (Signed)
Visit Information  Thank you for taking time to visit with me today. Please don't hesitate to contact me if I can be of assistance to you before our next scheduled telephone appointment.  Following are the goals we discussed today:   Goals Addressed             This Visit's Progress    RNCM Care Management Expected Outcomes: Monitor, Self-Mange and Reduce Symptoms of: DM, HTN       Current Barriers:  Knowledge Deficits related to plan of care for management of HTN and DMII  Chronic Disease Management support and education needs related to HTN and DMII   RNCM Clinical Goal(s):  Patient will verbalize basic understanding of  HTN and DMII disease process and self health management plan as evidenced by verbal explanation, recognizing symptoms, lifestyle modifications take all medications exactly as prescribed and will call provider for medication related questions as evidenced by compliance with all medications attend all scheduled medical appointments: with primary care provider and specialist as evidenced by keeping all scheduled appointments demonstrate Improved and Ongoing adherence to prescribed treatment plan for HTN and DMII as evidenced by consistent medication compliance, symptom monitoring and continued lifestyle modifications continue to work with RN Care Manager to address care management and care coordination needs related to  HTN and DMII as evidenced by adherence to CM Team Scheduled appointments through collaboration with RN Care manager, provider, and care team.   Interventions: Evaluation of current treatment plan related to  self management and patient's adherence to plan as established by provider   Diabetes Interventions:  (Status:  New goal. and Goal on track:  NO.) Long Term Goal Assessed patient's understanding of A1c goal: <6.5% Provided education to patient about basic DM disease process. Does not regularly check blood sugar and had not checked as off 11:45 am  today. RNCM advised to start checking his fasting glucose in the morning before he eats and before other meals and record. Does not follow recommended diet, RNCM will add some education in the AVS for patient to review. Reviewed medications with patient and discussed importance of medication adherence. Reports compliance with medications. Counseled on importance of regular laboratory monitoring as prescribed Discussed plans with patient for ongoing care management follow up and provided patient with direct contact information for care management team Provided patient with written educational materials related to hypo and hyperglycemia and importance of correct treatment Reviewed scheduled/upcoming provider appointments including: 02-19-2024 with PCP Advised patient, providing education and rationale, to check cbg fasting in the morning before breakfast and check before meals and record, calling PCP for findings outside established parameters Review of patient status, including review of consultants reports, relevant laboratory and other test results, and medications completed Screening for signs and symptoms of depression related to chronic disease state  Assessed social determinant of health barriers Lab Results  Component Value Date   HGBA1C 9.0 (H) 11/06/2023    Hypertension Interventions:  (Status:  New goal. and Goal on track:  Yes.) Long Term Goal Last practice recorded BP readings:  BP Readings from Last 3 Encounters:  11/06/23 132/74  08/21/23 (!) 98/58  07/14/23 129/73   Most recent eGFR/CrCl: No results found for: "EGFR"  No components found for: "CRCL"  Evaluation of current treatment plan related to hypertension self management and patient's adherence to plan as established by provider. Does not regularly check blood pressure. RNCM advised to check at least daily and keep a log. Patient verbalized understanding. Denies any  swelling in legs/feet. Provided education to patient re:  stroke prevention, s/s of heart attack and stroke Reviewed medications with patient and discussed importance of compliance Counseled on adverse effects of illicit drug and excessive alcohol use in patients with high blood pressure  Counseled on the importance of exercise goals with target of 150 minutes per week Discussed plans with patient for ongoing care management follow up and provided patient with direct contact information for care management team Advised patient, providing education and rationale, to monitor blood pressure daily and record, calling PCP for findings outside established parameters Reviewed scheduled/upcoming provider appointments including:  Advised patient to discuss any new changes with provider Provided education on prescribed diet ADA/Heart Healthy Diet Discussed complications of poorly controlled blood pressure such as heart disease, stroke, circulatory complications, vision complications, kidney impairment, sexual dysfunction Assessed social determinant of health barriers  Patient Goals/Self-Care Activities: Take all medications as prescribed Attend all scheduled provider appointments Call pharmacy for medication refills 3-7 days in advance of running out of medications Attend church or other social activities Perform all self care activities independently  Call provider office for new concerns or questions  schedule appointment with eye doctor check feet daily for cuts, sores or redness keep feet up while sitting check blood pressure daily  Follow Up Plan:  Telephone follow up appointment with care management team member scheduled for:  12-13-2023 at 11:45 am           Our next appointment is by telephone on 12-13-2023 at 11:45 am  Please call the care guide team at [phone] if you need to cancel or reschedule your appointment.   If you are experiencing a Mental Health or Behavioral Health Crisis or need someone to talk to, please call the Suicide and  Crisis Lifeline: 988 call the [place] National Suicide Prevention Lifeline: 430-006-4389 or TTY: 404-651-8070 TTY (808) 196-6729) to talk to a trained counselor call 1-800-273-TALK (toll free, 24 hour hotline)   Patient verbalizes understanding of instructions and care plan provided today and agrees to view in MyChart. Active MyChart status and patient understanding of how to access instructions and care plan via MyChart confirmed with patient.     Danise Edge, BSN RN RN Care Manager  [facility]  Ambulatory Care Management  Direct Number: [phone]    Diabetes Mellitus and Nutrition, Adult When you have diabetes, or diabetes mellitus, it is very important to have healthy eating habits because your blood sugar (glucose) levels are greatly affected by what you eat and drink. Eating healthy foods in the right amounts, at about the same times every day, can help you: Manage your blood glucose. Lower your risk of heart disease. Improve your blood pressure. Reach or maintain a healthy weight. What can affect my meal plan? Every person with diabetes is different, and each person has different needs for a meal plan. Your health care provider may recommend that you work with a dietitian to make a meal plan that is best for you. Your meal plan may vary depending on factors such as: The calories you need. The medicines you take. Your weight. Your blood glucose, blood pressure, and cholesterol levels. Your activity level. Other health conditions you have, such as heart or kidney disease. How do carbohydrates affect me? Carbohydrates, also called carbs, affect your blood glucose level more than any other type of food. Eating carbs raises the amount of glucose in your blood. It is important to know how many carbs you can safely have in each  meal. This is different for every person. Your dietitian can help you calculate how many carbs you should have at each meal and for each snack. How does  alcohol affect me? Alcohol can cause a decrease in blood glucose (hypoglycemia), especially if you use insulin or take certain diabetes medicines by mouth. Hypoglycemia can be a life-threatening condition. Symptoms of hypoglycemia, such as sleepiness, dizziness, and confusion, are similar to symptoms of having too much alcohol. Do not drink alcohol if: Your health care provider tells you not to drink. You are pregnant, may be pregnant, or are planning to become pregnant. If you drink alcohol: Limit how much you have to: 0-1 drink a day for women. 0-2 drinks a day for men. Know how much alcohol is in your drink. In the [place] one drink equals one 12 oz bottle of beer (355 mL), one 5 oz glass of wine (148 mL), or one 1 oz glass of hard liquor (44 mL). Keep yourself hydrated with water, diet soda, or unsweetened iced tea. Keep in mind that regular soda, juice, and other mixers may contain a lot of sugar and must be counted as carbs. What are tips for following this plan?  Reading food labels Start by checking the serving size on the Nutrition Facts label of packaged foods and drinks. The number of calories and the amount of carbs, fats, and other nutrients listed on the label are based on one serving of the item. Many items contain more than one serving per package. Check the total grams (g) of carbs in one serving. Check the number of grams of saturated fats and trans fats in one serving. Choose foods that have a low amount or none of these fats. Check the number of milligrams (mg) of salt (sodium) in one serving. Most people should limit total sodium intake to less than 2,300 mg per day. Always check the nutrition information of foods labeled as "low-fat" or "nonfat." These foods may be higher in added sugar or refined carbs and should be avoided. Talk to your dietitian to identify your daily goals for nutrients listed on the label. Shopping Avoid buying canned, pre-made, or processed foods.  These foods tend to be high in fat, sodium, and added sugar. Shop around the outside edge of the grocery store. This is where you will most often find fresh fruits and vegetables, bulk grains, fresh meats, and fresh dairy products. Cooking Use low-heat cooking methods, such as baking, instead of high-heat cooking methods, such as deep frying. Cook using healthy oils, such as olive, canola, or sunflower oil. Avoid cooking with butter, cream, or high-fat meats. Meal planning Eat meals and snacks regularly, preferably at the same times every day. Avoid going long periods of time without eating. Eat foods that are high in fiber, such as fresh fruits, vegetables, beans, and whole grains. Eat 4-6 oz (112-168 g) of lean protein each day, such as lean meat, chicken, fish, eggs, or tofu. One ounce (oz) (28 g) of lean protein is equal to: 1 oz (28 g) of meat, chicken, or fish. 1 egg.  cup (62 g) of tofu. Eat some foods each day that contain healthy fats, such as avocado, nuts, seeds, and fish. What foods should I eat? Fruits Berries. Apples. Oranges. Peaches. Apricots. Plums. Grapes. Mangoes. Papayas. Pomegranates. Kiwi. Cherries. Vegetables Leafy greens, including lettuce, spinach, kale, chard, collard greens, mustard greens, and cabbage. Beets. Cauliflower. Broccoli. Carrots. Green beans. Tomatoes. Peppers. Onions. Cucumbers. Brussels sprouts. Grains Whole grains, such as whole-wheat  or whole-grain bread, crackers, tortillas, cereal, and pasta. Unsweetened oatmeal. Quinoa. Brown or wild rice. Meats and other proteins Seafood. Poultry without skin. Lean cuts of poultry and beef. Tofu. Nuts. Seeds. Dairy Low-fat or fat-free dairy products such as milk, yogurt, and cheese. The items listed above may not be a complete list of foods and beverages you can eat and drink. Contact a dietitian for more information. What foods should I avoid? Fruits Fruits canned with syrup. Vegetables Canned vegetables.  Frozen vegetables with butter or cream sauce. Grains Refined white flour and flour products such as bread, pasta, snack foods, and cereals. Avoid all processed foods. Meats and other proteins Fatty cuts of meat. Poultry with skin. Breaded or fried meats. Processed meat. Avoid saturated fats. Dairy Full-fat yogurt, cheese, or milk. Beverages Sweetened drinks, such as soda or iced tea. The items listed above may not be a complete list of foods and beverages you should avoid. Contact a dietitian for more information. Questions to ask a health care provider Do I need to meet with a certified diabetes care and education specialist? Do I need to meet with a dietitian? What number can I call if I have questions? When are the best times to check my blood glucose? Where to find more information: American Diabetes Association: diabetes.org Academy of Nutrition and Dietetics: eatright.[employer] of Diabetes and Digestive and Kidney Diseases: StageSync.si Association of Diabetes Care & Education Specialists: diabeteseducator.org Summary It is important to have healthy eating habits because your blood sugar (glucose) levels are greatly affected by what you eat and drink. It is important to use alcohol carefully. A healthy meal plan will help you manage your blood glucose and lower your risk of heart disease. Your health care provider may recommend that you work with a dietitian to make a meal plan that is best for you. This information is not intended to replace advice given to you by your health care provider. Make sure you discuss any questions you have with your health care provider. Document Revised: 06/17/2020 Document Reviewed: 06/17/2020 Elsevier Patient Education  2024 Elsevier Inc.   Diabetes Mellitus and Meal Planning In this video, you will learn how to make healthy food choices whether eating at home or away from home. You will also learn about food portion sizes, how to  read food labels and healthy food preparation techniques. To view the content, go to this web address: https://pe.elsevier.com/[email or web address]  This video will expire on: 05/21/2025. If you need access to this video following this date, please reach out to the healthcare provider who assigned it to you. This information is not intended to replace advice given to you by your health care provider. Make sure you discuss any questions you have with your health care provider. Elsevier Patient Education  2024 [employer].

## 2023-11-27 ENCOUNTER — Telehealth: Payer: Self-pay

## 2023-11-27 NOTE — Telephone Encounter (Signed)
Reason for CRM: [name] from [facility] called in stating that patient insurance is out of network and would like for the referral to be rerouted

## 2023-12-06 ENCOUNTER — Other Ambulatory Visit: Payer: Self-pay | Admitting: Family Medicine

## 2023-12-11 ENCOUNTER — Ambulatory Visit: Payer: Self-pay | Admitting: Family Medicine

## 2023-12-11 ENCOUNTER — Encounter: Payer: Self-pay | Admitting: Family Medicine

## 2023-12-11 ENCOUNTER — Ambulatory Visit: Payer: 59 | Admitting: Family Medicine

## 2023-12-11 VITALS — BP 124/60 | HR 55 | Temp 97.6°F | Wt 219.0 lb

## 2023-12-11 DIAGNOSIS — L720 Epidermal cyst: Secondary | ICD-10-CM

## 2023-12-11 DIAGNOSIS — L02212 Cutaneous abscess of back [any part, except buttock]: Secondary | ICD-10-CM | POA: Diagnosis not present

## 2023-12-11 MED ORDER — SULFAMETHOXAZOLE-TRIMETHOPRIM 800-160 MG PO TABS: 1.0000 | ORAL_TABLET | Freq: Two times a day (BID) | ORAL | 0 refills | Status: AC

## 2023-12-11 NOTE — Telephone Encounter (Signed)
 Copied from CRM [id number]. Topic: Clinical - Red Word Triage >> Dec 11, 2023  8:16 AM Antonio DEL wrote: [facility] that prompted transfer to Nurse Triage: spot on back that's been there since new years day, has blood and green discharge coming from it, it's also swollen  Chief Complaint: boil on back Symptoms: boil, painful Frequency: started 11/29/23 Pertinent Negatives: Patient denies fever, chills Disposition: [] ED /[] Urgent Care (no appt availability in office) / [x] Appointment(In office/virtual)/ []  [facility]/ [] Home Care/ [] Refused Recommended Disposition /[]  Mobile Bus/ []  Follow-up with PCP Additional Notes: patient states boil started on 11/29/23.  Unable to visualized boil, on back. Apt made for this am.  Instructed to go to ER if becomes worse.   Reason for Disposition  [1] Spreading redness around the boil AND [2] no fever  Answer Assessment - Initial Assessment Questions 1. APPEARANCE of BOIL: What does the boil look like?      Can't see it, on back 2. LOCATION: Where is the boil located?      On back 3. NUMBER: How many boils are there?      1 4. SIZE: How big is the boil? (e.g., inches, cm; compare to size of a coin or other object)     unknown 5. ONSET: When did the boil start?     Jan. 1, 2025 6. PAIN: Is there any pain? If Yes, ask: How bad is the pain?   (Scale 1-10; or mild, moderate, severe)     5/10 7. FEVER: Do you have a fever? If Yes, ask: What is it, how was it measured, and when did it start?      no 8. SOURCE: Have you been around anyone with boils or other Staph infections? Have you ever had boils before?     denies 9. OTHER SYMPTOMS: Do you have any other symptoms? (e.g., shaking chills, weakness, rash elsewhere on body)  Denies.  Protocols used: Boil (Skin Abscess)-A-AH

## 2023-12-11 NOTE — Patient Instructions (Addendum)
 Return in about 2 days (around 12/13/2023).   Keep dressing in place until we see each other in 2 days Start antibiotic today     Great to see you today.  I have refilled the medication(s) we provide.   If labs were collected or images ordered, we will inform you of  results once we have received them and reviewed. We will contact you either by echart message, or telephone call.  Please give ample time to the testing facility, and our office to run,  receive and review results. Please do not call inquiring of results, even if you can see them in your chart. We will contact you as soon as we are able. If it has been over 1 week since the test was completed, and you have not yet heard from us , then please call us .    - echart message- for normal results that have been seen by the patient already.   - telephone call: abnormal results or if patient has not viewed results in their echart.  If a referral to a specialist was entered for you, please call us  in 2 weeks if you have not heard from the specialist office to schedule.

## 2023-12-11 NOTE — Progress Notes (Signed)
 [name] , 05-Nov-1956, 68 y.o., male MRN: [id number] Patient Care Team    Relationship Specialty Notifications Start End  Catherine Charlies LABOR, DO PCP - General Family Medicine  11/29/18   Valdemar Rogue, MD Consulting Physician Ophthalmology  01/09/19   Alvan Dorn FALCON, MD Consulting Physician Cardiology  07/19/23   Bertrum Rosina HERO, RN VBCI Care Management General Practice  11/14/23    Comment: [phone]    Chief Complaint  Patient presents with   Cyst    On back, red and inflamed; noticed on 01/01; has drainage of red and green discharge     Subjective: [name] is a 68 y.o. Pt presents for an OV with complaints of back cyst of 12 days duration.  Associated symptoms include blood tinge drainage and green drainage.  He states he has had cysts on his back before in a similar location that required I&D.  Patient denies fevers or chills.      08/21/2023    1:04 PM 03/24/2023    1:35 PM 12/09/2022    1:43 PM 12/05/2022    9:54 AM 08/19/2022   12:57 PM  Depression screen PHQ 2/9  Decreased Interest 0 0 2 1 0  Down, Depressed, Hopeless 0 0 0 0 0  PHQ - 2 Score 0 0 2 1 0  Altered sleeping   0    Tired, decreased energy   2    Change in appetite   3    Feeling bad or failure about yourself    0    Trouble concentrating   2    Moving slowly or fidgety/restless   0    Suicidal thoughts   0    PHQ-9 Score   9      Allergies  Allergen Reactions   Gabapentin  Other (See Comments)    Paranoia/hallucinations   Hydrocodone  Other (See Comments)    PT reports  Confusion when tacking Hydrocodone    Nsaids     History of heart disease, CAD with history of MI, CABG status post stent   Prednisone     Paranoia   Tramadol     Penicillins Other (See Comments)    Childhood unsure if still allgeric   Social History   Social History Narrative   Marital status/children/pets: Single   Education/employment: HS grad, 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:      -smoke alarm in the  home:Yes     - wears seatbelt: Yes      Past Medical History:  Diagnosis Date   Alcohol addiction (HCC)    Anxiety    Arthritis    Bulging of lumbar intervertebral disc 2017   MRI: L3-4, L4-5 and L5-S1- facet hypertrophy and formainal stenosis   CAD (coronary artery disease)    Coronary artery disease with hx of myocardial infarct w/o hx of CABG 2004   Depression    Dextrocardia    Diabetes (HCC)    Heart attack (HCC) 2004   Stent placement 2008   Hyperlipidemia    Hypertension    Insomnia    Past Surgical History:  Procedure Laterality Date   CORONARY ANGIOPLASTY WITH STENT PLACEMENT  2008   Family History  Problem Relation Age of Onset   Heart attack Mother    Heart disease Mother    Diabetes Mother    Arthritis Mother    Hyperlipidemia Mother    Hypertension Mother    Breast cancer Mother    Heart  attack Father    Heart disease Father    Alcohol abuse Father    Early death Father    Arthritis Sister    Heart disease Sister    Hyperlipidemia Sister    Asthma Sister    COPD Sister    Diabetes Sister    Alcohol abuse Sister    Allergies as of 12/11/2023       Reactions   Gabapentin  Other (See Comments)   Paranoia/hallucinations   Hydrocodone  Other (See Comments)   PT reports  Confusion when tacking Hydrocodone    Nsaids    History of heart disease, CAD with history of MI, CABG status post stent   Prednisone    Paranoia   Tramadol     Penicillins Other (See Comments)   Childhood unsure if still allgeric        Medication List        Accurate as of December 11, 2023 11:59 PM. If you have any questions, ask your nurse or doctor.          Accu-Chek FastClix Lancets Misc   aspirin  EC 81 MG tablet Take 1 tablet (81 mg total) by mouth daily.   dapagliflozin  propanediol 10 MG Tabs tablet Commonly known as: Farxiga  TAKE 1 TABLET BY MOUTH  DAILY BEFORE BREAKFAST   diazepam  5 MG tablet Commonly known as: VALIUM  Take 1 tablet (5 mg total) by mouth  every 6 (six) hours as needed for anxiety.   Janumet  XR [phone] MG Tb24 Generic drug: SitaGLIPtin -MetFORMIN  HCl Take 1 tablet by mouth daily.   Lantus  SoloStar 100 UNIT/ML Solostar Pen Generic drug: insulin glargine  Inject 65 Units into the skin daily.   lisinopril  20 MG tablet Commonly known as: ZESTRIL  Take 1 tablet (20 mg total) by mouth daily.   naproxen  500 MG tablet Commonly known as: NAPROSYN  TAKE 1 TABLET BY MOUTH EVERY 12 HOURS W/FOOD AS NEEDED ONLY *NEW PRESCRIPTION REQUEST*   nitroGLYCERIN  0.4 MG SL tablet Commonly known as: NITROSTAT  DISSOLVE 1 TABLET UNDER THE TONGUE EVERY 5 MINUTES AS  NEEDED FOR CHEST PAIN. MAX  OF 3 TABLETS IN 15 MINUTES. CALL 911 IF PAIN PERSISTS.   omega-3 acid ethyl esters 1 g capsule Commonly known as: LOVAZA  Take 1 capsule (1 g total) by mouth daily.   OneTouch Verio test strip Generic drug: glucose blood USE AS DIRECTED *NEW PRESCRIPTION REQUEST*   rosuvastatin  40 MG tablet Commonly known as: CRESTOR  Take 1 tablet (40 mg total) by mouth at bedtime.   sulfamethoxazole -trimethoprim  800-160 MG tablet Commonly known as: BACTRIM  DS Take 1 tablet by mouth 2 (two) times daily for 7 days. Started by: Charlies Bellini   traZODone  100 MG tablet Commonly known as: DESYREL  Take 1 tablet (100 mg total) by mouth at bedtime as needed for sleep.   UltiCare Pen Needles 31G X 5 MM Misc Generic drug: Insulin Pen Needle USE TO INJECT PEN INSULIN *NEW PRESCRIPTION REQUEST*        All past medical history, surgical history, allergies, family history, immunizations andmedications were updated in the EMR today and reviewed under the history and medication portions of their EMR.     ROS Negative, with the exception of above mentioned in HPI   Objective:  BP 124/60   Pulse (!) 55   Temp 97.6 F (36.4 C)   Wt 219 lb (99.3 kg)   SpO2 97%   BMI 30.54 kg/m  Body mass index is 30.54 kg/m. Physical Exam Vitals and nursing note reviewed.   Constitutional:  General: He is not in acute distress.    Appearance: Normal appearance. He is not ill-appearing, toxic-appearing or diaphoretic.  HENT:     Head: Normocephalic and atraumatic.  Eyes:     General: No scleral icterus.       Right eye: No discharge.        Left eye: No discharge.     Extraocular Movements: Extraocular movements intact.     Pupils: Pupils are equal, round, and reactive to light.  Musculoskeletal:       Back:     Comments: Approximately 5 cm diameter of erythema with draining abscess.  Appears to be 3 epidermal cyst that are draining in the same area.  Skin:    General: Skin is warm and dry.     Coloration: Skin is not jaundiced or pale.     Findings: No rash.  Neurological:     Mental Status: He is alert and oriented to person, place, and time. Mental status is at baseline.  Psychiatric:        Mood and Affect: Mood normal.        Behavior: Behavior normal.        Thought Content: Thought content normal.        Judgment: Judgment normal.      No results found. No results found. No results found for this or any previous visit (from the past 24 hours).  Assessment/Plan: [name] is a 68 y.o. male present for OV for  Back abscess (Primary)/epidermal cysts Rather significant abscess of his back which contains at least 3 epidermal cysts that are infected and inflamed.  We discussed the need for I&D today and he is agreeable. - Wound culture sent Start Bactrim  DS twice daily x 7 days Leave dressing in place until seen on Wednesday Procedure Note:  PROCEDURE NOTE: Incision and Drainage Performed by: Dr. Catherine Indication: Back abscess Anesthesia was obtained with 6 ml of 1% lidocaine  with epinephrine. The area was prepped in the usual sterile fashion. A number 11 scalpel was used to create an incision mid-inferior portion of abscess to allow drainage. Return was 2 cc of purulent fluid. Culture was  obtained. The site was  packed with  plain gauze. A dressing was placed over the site. The patient tolerated the procedure well. Wound care instructions were given. Patient to follow up 2 days  Post-Procedure Diagnosis: Multiple infected and inflamed epidermal cysts creating abscess of back  Complications: None Estimated Blood Loss: 0 mL  Reviewed expectations re: course of current medical issues. Discussed self-management of symptoms. Outlined signs and symptoms indicating need for more acute intervention. Patient verbalized understanding and all questions were answered. Patient received an After-Visit Summary.    Orders Placed This Encounter  Procedures   Wound culture   Meds ordered this encounter  Medications   sulfamethoxazole -trimethoprim  (BACTRIM  DS) 800-160 MG tablet    Sig: Take 1 tablet by mouth 2 (two) times daily for 7 days.    Dispense:  14 tablet    Refill:  0   Referral Orders  No referral(s) requested today     Note is dictated utilizing voice recognition software. Although note has been proof read prior to signing, occasional typographical errors still can be missed. If any questions arise, please do not hesitate to call for verification.   electronically signed by:  Charlies Catherine, DO  [facility] [place]

## 2023-12-11 NOTE — Telephone Encounter (Signed)
 No further action needed.

## 2023-12-12 ENCOUNTER — Encounter: Payer: Self-pay | Admitting: Family Medicine

## 2023-12-12 ENCOUNTER — Telehealth: Payer: Self-pay

## 2023-12-12 DIAGNOSIS — L720 Epidermal cyst: Secondary | ICD-10-CM | POA: Insufficient documentation

## 2023-12-12 DIAGNOSIS — L02212 Cutaneous abscess of back [any part, except buttock]: Secondary | ICD-10-CM | POA: Insufficient documentation

## 2023-12-12 NOTE — Telephone Encounter (Signed)
 Pt is going to have his sister come and help put new dressing on. Pt states everything came off during the night.

## 2023-12-12 NOTE — Telephone Encounter (Signed)
 Copied from CRM [id number]. Topic: Clinical - Medical Advice >> Dec 12, 2023  8:06 AM [name] GRADE wrote: Reason for CRM: Patient is calling because he is unable to cover the wound area by himself and would like to speak to [facility] for recommendations. Confirmed with patient that he has started taking the antibiotics.

## 2023-12-13 ENCOUNTER — Encounter: Payer: Self-pay | Admitting: Family Medicine

## 2023-12-13 ENCOUNTER — Other Ambulatory Visit: Payer: Self-pay | Admitting: *Deleted

## 2023-12-13 ENCOUNTER — Ambulatory Visit: Payer: 59 | Admitting: Family Medicine

## 2023-12-13 VITALS — BP 100/52 | HR 68 | Temp 97.6°F | Wt 218.8 lb

## 2023-12-13 DIAGNOSIS — Z5189 Encounter for other specified aftercare: Secondary | ICD-10-CM

## 2023-12-13 DIAGNOSIS — L720 Epidermal cyst: Secondary | ICD-10-CM

## 2023-12-13 DIAGNOSIS — L02212 Cutaneous abscess of back [any part, except buttock]: Secondary | ICD-10-CM

## 2023-12-13 NOTE — Progress Notes (Signed)
 [name] , November 23, 1956, 68 y.o., male MRN: [id number] Patient Care Team    Relationship Specialty Notifications Start End  Mariel Shope, DO PCP - General Family Medicine  11/29/18   Ronelle Coffee, MD Consulting Physician Ophthalmology  01/09/19   Laurann Pollock, MD Consulting Physician Cardiology  07/19/23   Remona Carmel, RN VBCI Care Management General Practice  11/14/23    Comment: [phone]    Chief Complaint  Patient presents with   Wound Check     Subjective: [name] is a 68 y.o. Pt presents for an OV to follow-up on multiple epidermal cysts right upper back.  Patient was seen 2 days ago with complaints of back cyst of 12 days duration.  Associated symptoms included blood tinge drainage and green drainage.  He states he has had cysts on his back before in a similar location that required I&D.  Patient denies fevers or chills.  Patient underwent I&D in the office of his abscess.  Patient reports that has been draining over the last 2 days.  He denies any fevers.  He states he did start the Bactrim  DS twice daily.     08/21/2023    1:04 PM 03/24/2023    1:35 PM 12/09/2022    1:43 PM 12/05/2022    9:54 AM 08/19/2022   12:57 PM  Depression screen PHQ 2/9  Decreased Interest 0 0 2 1 0  Down, Depressed, Hopeless 0 0 0 0 0  PHQ - 2 Score 0 0 2 1 0  Altered sleeping   0    Tired, decreased energy   2    Change in appetite   3    Feeling bad or failure about yourself    0    Trouble concentrating   2    Moving slowly or fidgety/restless   0    Suicidal thoughts   0    PHQ-9 Score   9      Allergies  Allergen Reactions   Gabapentin  Other (See Comments)    Paranoia/hallucinations   Hydrocodone  Other (See Comments)    PT reports  Confusion when tacking Hydrocodone    Nsaids     History of heart disease, CAD with history of MI, CABG status post stent   Prednisone     Paranoia   Tramadol     Penicillins Other (See Comments)    Childhood unsure if  still allgeric   Social History   Social History Narrative   Marital status/children/pets: Single   Education/employment: HS grad, 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:      -smoke alarm in the home:Yes     - wears seatbelt: Yes      Past Medical History:  Diagnosis Date   Alcohol addiction (HCC)    Anxiety    Arthritis    Bulging of lumbar intervertebral disc 2017   MRI: L3-4, L4-5 and L5-S1- facet hypertrophy and formainal stenosis   CAD (coronary artery disease)    Coronary artery disease with hx of myocardial infarct w/o hx of CABG 2004   Depression    Dextrocardia    Diabetes (HCC)    Heart attack (HCC) 2004   Stent placement 2008   Hyperlipidemia    Hypertension    Insomnia    Past Surgical History:  Procedure Laterality Date   CORONARY ANGIOPLASTY WITH STENT PLACEMENT  2008   Family History  Problem Relation Age of Onset   Heart attack Mother  Heart disease Mother    Diabetes Mother    Arthritis Mother    Hyperlipidemia Mother    Hypertension Mother    Breast cancer Mother    Heart attack Father    Heart disease Father    Alcohol abuse Father    Early death Father    Arthritis Sister    Heart disease Sister    Hyperlipidemia Sister    Asthma Sister    COPD Sister    Diabetes Sister    Alcohol abuse Sister    Allergies as of 12/13/2023       Reactions   Gabapentin  Other (See Comments)   Paranoia/hallucinations   Hydrocodone  Other (See Comments)   PT reports  Confusion when tacking Hydrocodone    Nsaids    History of heart disease, CAD with history of MI, CABG status post stent   Prednisone    Paranoia   Tramadol     Penicillins Other (See Comments)   Childhood unsure if still allgeric        Medication List        Accurate as of December 13, 2023  3:08 PM. If you have any questions, ask your nurse or doctor.          Accu-Chek FastClix Lancets Misc   aspirin  EC 81 MG tablet Take 1 tablet (81 mg total) by mouth daily.    dapagliflozin  propanediol 10 MG Tabs tablet Commonly known as: Farxiga  TAKE 1 TABLET BY MOUTH  DAILY BEFORE BREAKFAST   diazepam  5 MG tablet Commonly known as: VALIUM  Take 1 tablet (5 mg total) by mouth every 6 (six) hours as needed for anxiety.   Janumet  XR [id number] MG Tb24 Generic drug: SitaGLIPtin -MetFORMIN  HCl Take 1 tablet by mouth daily.   Lantus  SoloStar 100 UNIT/ML Solostar Pen Generic drug: insulin glargine  Inject 65 Units into the skin daily.   lisinopril  20 MG tablet Commonly known as: ZESTRIL  Take 1 tablet (20 mg total) by mouth daily.   naproxen  500 MG tablet Commonly known as: NAPROSYN  TAKE 1 TABLET BY MOUTH EVERY 12 HOURS W/FOOD AS NEEDED ONLY *NEW PRESCRIPTION REQUEST*   nitroGLYCERIN  0.4 MG SL tablet Commonly known as: NITROSTAT  DISSOLVE 1 TABLET UNDER THE TONGUE EVERY 5 MINUTES AS  NEEDED FOR CHEST PAIN. MAX  OF 3 TABLETS IN 15 MINUTES. CALL 911 IF PAIN PERSISTS.   omega-3 acid ethyl esters 1 g capsule Commonly known as: LOVAZA  Take 1 capsule (1 g total) by mouth daily.   OneTouch Verio test strip Generic drug: glucose blood USE AS DIRECTED *NEW PRESCRIPTION REQUEST*   rosuvastatin  40 MG tablet Commonly known as: CRESTOR  Take 1 tablet (40 mg total) by mouth at bedtime.   sulfamethoxazole -trimethoprim  800-160 MG tablet Commonly known as: BACTRIM  DS Take 1 tablet by mouth 2 (two) times daily for 7 days.   traZODone  100 MG tablet Commonly known as: DESYREL  Take 1 tablet (100 mg total) by mouth at bedtime as needed for sleep.   UltiCare Pen Needles 31G X 5 MM Misc Generic drug: Insulin Pen Needle USE TO INJECT PEN INSULIN *NEW PRESCRIPTION REQUEST*        All past medical history, surgical history, allergies, family history, immunizations andmedications were updated in the EMR today and reviewed under the history and medication portions of their EMR.     ROS Negative, with the exception of above mentioned in HPI   Objective:  BP (!)  100/52   Pulse 68   Temp 97.6 F (36.4 C)  Wt 218 lb 12.8 oz (99.2 kg)   SpO2 96%   BMI 30.52 kg/m  Body mass index is 30.52 kg/m. Physical Exam Vitals and nursing note reviewed.  Constitutional:      General: He is not in acute distress.    Appearance: Normal appearance. He is not ill-appearing, toxic-appearing or diaphoretic.  HENT:     Head: Normocephalic and atraumatic.  Eyes:     General: No scleral icterus.       Right eye: No discharge.        Left eye: No discharge.     Extraocular Movements: Extraocular movements intact.     Pupils: Pupils are equal, round, and reactive to light.  Musculoskeletal:       Back:     Comments: Approximately 5 cm diameter of erythema with draining abscess.  Appears to be 4 epidermal cyst that are draining in the same area.  Skin:    General: Skin is warm and dry.     Coloration: Skin is not jaundiced or pale.     Findings: No rash.  Neurological:     Mental Status: He is alert and oriented to person, place, and time. Mental status is at baseline.  Psychiatric:        Mood and Affect: Mood normal.        Behavior: Behavior normal.        Thought Content: Thought content normal.        Judgment: Judgment normal.      No results found. No results found. No results found for this or any previous visit (from the past 24 hours).  Assessment/Plan: [name] is a 68 y.o. male present for OV for  Back abscess (Primary)/epidermal cysts Rather significant abscess of his back which contains at least 3-4 epidermal cysts that are infected and inflamed.  Drainage and irrigation was required again today. - Wound culture pending Start Bactrim  DS twice daily x 7 days Patient provided with tape, sterile gauze and Telfa, encouraged to change dressing every 24 hours after showering. Procedure Note:  PROCEDURE NOTE: Irrigation and drainage Performed by: Dr. Marylee Snowball Indication: Back abscess Anesthesia was obtained with 3 ml of 1% lidocaine   with epinephrine. The area was prepped in the usual sterile fashion.  Return was 3 cc of purulent fluid and thick white cheesy epidermal cyst material. The site was  packed with plain gauze. A dressing was placed over the site. The patient tolerated the procedure well. Wound care instructions were given. Patient to follow up 2 days  Post-Procedure Diagnosis: Multiple infected and inflamed epidermal cysts creating abscess of back  Complications: None Estimated Blood Loss: 0.25 mL  Appointment was made for patient in 2 days.  Culture should be back by then. Consider surgical referral if not seeing improvement  Reviewed expectations re: course of current medical issues. Discussed self-management of symptoms. Outlined signs and symptoms indicating need for more acute intervention. Patient verbalized understanding and all questions were answered. Patient received an After-Visit Summary.    No orders of the defined types were placed in this encounter.  No orders of the defined types were placed in this encounter.  Referral Orders  No referral(s) requested today     Note is dictated utilizing voice recognition software. Although note has been proof read prior to signing, occasional typographical errors still can be missed. If any questions arise, please do not hesitate to call for verification.   electronically signed by:  Napolean Backbone, DO  [facility] [place]

## 2023-12-13 NOTE — Patient Instructions (Signed)
 Follow-up Friday at 2:40 PM Change dressing every 24 hours, keep area clean and dry        Great to see you today.  I have refilled the medication(s) we provide.   If labs were collected or images ordered, we will inform you of  results once we have received them and reviewed. We will contact you either by echart message, or telephone call.  Please give ample time to the testing facility, and our office to run,  receive and review results. Please do not call inquiring of results, even if you can see them in your chart. We will contact you as soon as we are able. If it has been over 1 week since the test was completed, and you have not yet heard from us , then please call us .    - echart message- for normal results that have been seen by the patient already.   - telephone call: abnormal results or if patient has not viewed results in their echart.  If a referral to a specialist was entered for you, please call us  in 2 weeks if you have not heard from the specialist office to schedule.

## 2023-12-15 ENCOUNTER — Encounter: Payer: Self-pay | Admitting: Family Medicine

## 2023-12-15 ENCOUNTER — Ambulatory Visit: Payer: 59 | Admitting: Family Medicine

## 2023-12-15 VITALS — BP 104/60 | HR 69 | Temp 98.1°F | Wt 216.0 lb

## 2023-12-15 DIAGNOSIS — Z5189 Encounter for other specified aftercare: Secondary | ICD-10-CM

## 2023-12-15 DIAGNOSIS — L02212 Cutaneous abscess of back [any part, except buttock]: Secondary | ICD-10-CM

## 2023-12-15 DIAGNOSIS — L720 Epidermal cyst: Secondary | ICD-10-CM

## 2023-12-15 LAB — WOUND CULTURE
MICRO NUMBER:: 15947823
RESULT:: NO GROWTH
SPECIMEN QUALITY:: ADEQUATE

## 2023-12-15 MED ORDER — MUPIROCIN 2 % EX OINT: 1.0000 | TOPICAL_OINTMENT | Freq: Two times a day (BID) | CUTANEOUS | 0 refills | Status: AC

## 2023-12-15 NOTE — Patient Instructions (Addendum)
No follow-ups on file.  Keep area clean with warm soapy water daily, then cover with prescribed Bactroban ointment and new sterile dressing every 24 hours.      Great to see you today.  I have refilled the medication(s) we provide.   If labs were collected or images ordered, we will inform you of  results once we have received them and reviewed. We will contact you either by echart message, or telephone call.  Please give ample time to the testing facility, and our office to run,  receive and review results. Please do not call inquiring of results, even if you can see them in your chart. We will contact you as soon as we are able. If it has been over 1 week since the test was completed, and you have not yet heard from [place], then please call us.    - echart message- for normal results that have been seen by the patient already.   - telephone call: abnormal results or if patient has not viewed results in their echart.  If a referral to a specialist was entered for you, please call us in 2 weeks if you have not heard from the specialist office to schedule.

## 2023-12-15 NOTE — Progress Notes (Signed)
[name] , 1956-02-06, 68 y.o., male MRN: [id number] Patient Care Team    Relationship Specialty Notifications Start End  Natalia Leatherwood, DO PCP - General Family Medicine  11/29/18   Rennis Chris, MD Consulting Physician Ophthalmology  01/09/19   Antoine Poche, MD Consulting Physician Cardiology  07/19/23   Ricky Stabs, RN [facility] Care Management General Practice  11/14/23    Comment: [phone]    Chief Complaint  Patient presents with   Wound Check     Subjective: [name] is a 68 y.o. Pt presents for an OV to follow-up on multiple epidermal cysts right upper back.  Patient was seen on Monday and Wednesday of this week.  Initial I&D completed on Monday, repeat I&D required on Wednesday.  Urine culture shows no growth.  Patient is taking Bactrim as prescribed.  There are multiple epidermal cysts in this location that have been draining. Today he states he is unable to change the dressing himself and does not have any assistance in the house currently.  His sister has not been able to make it over to help him.  He denies any fevers or chills.  Wound continues to drain.  He is finishing the antibiotics prescribed.  Wound culture negative.  Component Ref Range & Units (hover) 4 d ago  MICRO NUMBER: [id number]  SPECIMEN QUALITY: Adequate  SOURCE: WOUND (SITE NOT SPECIFIED)  STATUS: FINAL  GRAM STAIN: Many White blood cells seen Few epithelial cells No organisms seen  RESULT: No Growth  Resulting Agency [facility]     Prior note: Patient was seen 2 days ago with complaints of back cyst of 12 days duration.  Associated symptoms included blood tinge drainage and green drainage.  He states he has had cysts on his back before in a similar location that required I&D.  Patient denies fevers or chills.  Patient underwent I&D in the office of his abscess.  Patient reports that has been draining over the last 2 days.  He denies any fevers.  He states  he did start the Bactrim DS twice daily.     08/21/2023    1:04 PM 03/24/2023    1:35 PM 12/09/2022    1:43 PM 12/05/2022    9:54 AM 08/19/2022   12:57 PM  Depression screen PHQ 2/9  Decreased Interest 0 0 2 1 0  Down, Depressed, Hopeless 0 0 0 0 0  PHQ - 2 Score 0 0 2 1 0  Altered sleeping   0    Tired, decreased energy   2    Change in appetite   3    Feeling bad or failure about yourself    0    Trouble concentrating   2    Moving slowly or fidgety/restless   0    Suicidal thoughts   0    PHQ-9 Score   9      Allergies  Allergen Reactions   Gabapentin Other (See Comments)    Paranoia/hallucinations   Hydrocodone Other (See Comments)    PT reports  Confusion when tacking Hydrocodone   Nsaids     History of heart disease, CAD with history of MI, CABG status post stent   Prednisone     Paranoia   Tramadol    Penicillins Other (See Comments)    Childhood unsure if still allgeric   Social History   Social History Narrative   Marital status/children/pets: Single  Education/employment: HS grad, retired-Floor installator   Safety:      -smoke alarm in the home:Yes     - wears seatbelt: Yes      Past Medical History:  Diagnosis Date   Alcohol addiction (HCC)    Anxiety    Arthritis    Bulging of lumbar intervertebral disc 2017   MRI: L3-4, L4-5 and L5-S1- facet hypertrophy and formainal stenosis   CAD (coronary artery disease)    Coronary artery disease with hx of myocardial infarct w/o hx of CABG 2004   Depression    Dextrocardia    Diabetes (HCC)    Heart attack (HCC) 2004   Stent placement 2008   Hyperlipidemia    Hypertension    Insomnia    Past Surgical History:  Procedure Laterality Date   CORONARY ANGIOPLASTY WITH STENT PLACEMENT  2008   Family History  Problem Relation Age of Onset   Heart attack Mother    Heart disease Mother    Diabetes Mother    Arthritis Mother    Hyperlipidemia Mother    Hypertension Mother    Breast cancer Mother     Heart attack Father    Heart disease Father    Alcohol abuse Father    Early death Father    Arthritis Sister    Heart disease Sister    Hyperlipidemia Sister    Asthma Sister    COPD Sister    Diabetes Sister    Alcohol abuse Sister    Allergies as of 12/15/2023       Reactions   Gabapentin Other (See Comments)   Paranoia/hallucinations   Hydrocodone Other (See Comments)   PT reports  Confusion when tacking Hydrocodone   Nsaids    History of heart disease, CAD with history of MI, CABG status post stent   Prednisone    Paranoia   Tramadol    Penicillins Other (See Comments)   Childhood unsure if still allgeric        Medication List        Accurate as of December 15, 2023  3:28 PM. If you have any questions, ask your nurse or doctor.          Accu-Chek FastClix Lancets Misc   aspirin EC 81 MG tablet Take 1 tablet (81 mg total) by mouth daily.   dapagliflozin propanediol 10 MG Tabs tablet Commonly known as: Farxiga TAKE 1 TABLET BY MOUTH  DAILY BEFORE BREAKFAST   diazepam 5 MG tablet Commonly known as: VALIUM Take 1 tablet (5 mg total) by mouth every 6 (six) hours as needed for anxiety.   Janumet XR [id number] MG Tb24 Generic drug: SitaGLIPtin-MetFORMIN HCl Take 1 tablet by mouth daily.   Lantus SoloStar 100 UNIT/ML Solostar Pen Generic drug: insulin glargine Inject 65 Units into the skin daily.   lisinopril 20 MG tablet Commonly known as: ZESTRIL Take 1 tablet (20 mg total) by mouth daily.   mupirocin ointment 2 % Commonly known as: BACTROBAN Apply 1 Application topically 2 (two) times daily for 21 days. Started by: Felix Pacini   naproxen 500 MG tablet Commonly known as: NAPROSYN TAKE 1 TABLET BY MOUTH EVERY 12 HOURS W/FOOD AS NEEDED ONLY *NEW PRESCRIPTION REQUEST*   nitroGLYCERIN 0.4 MG SL tablet Commonly known as: NITROSTAT DISSOLVE 1 TABLET UNDER THE TONGUE EVERY 5 MINUTES AS  NEEDED FOR CHEST PAIN. MAX  OF 3 TABLETS IN 15 MINUTES. CALL 911  IF PAIN PERSISTS.   omega-3 acid ethyl esters 1  g capsule Commonly known as: LOVAZA Take 1 capsule (1 g total) by mouth daily.   OneTouch Verio test strip Generic drug: glucose blood USE AS DIRECTED *NEW PRESCRIPTION REQUEST*   rosuvastatin 40 MG tablet Commonly known as: CRESTOR Take 1 tablet (40 mg total) by mouth at bedtime.   sulfamethoxazole-trimethoprim 800-160 MG tablet Commonly known as: BACTRIM DS Take 1 tablet by mouth 2 (two) times daily for 7 days.   traZODone 100 MG tablet Commonly known as: DESYREL Take 1 tablet (100 mg total) by mouth at bedtime as needed for sleep.   UltiCare Pen Needles 31G X 5 MM Misc Generic drug: Insulin Pen Needle USE TO INJECT PEN INSULIN *NEW PRESCRIPTION REQUEST*        All past medical history, surgical history, allergies, family history, immunizations andmedications were updated in the EMR today and reviewed under the history and medication portions of their EMR.     ROS Negative, with the exception of above mentioned in HPI   Objective:  BP 104/60   Pulse 69   Temp 98.1 F (36.7 C)   Wt 216 lb (98 kg)   SpO2 97%   BMI 30.13 kg/m  Body mass index is 30.13 kg/m. Physical Exam Vitals and nursing note reviewed.  Constitutional:      General: He is not in acute distress.    Appearance: Normal appearance. He is not ill-appearing, toxic-appearing or diaphoretic.  HENT:     Head: Normocephalic and atraumatic.  Eyes:     General: No scleral icterus.       Right eye: No discharge.        Left eye: No discharge.     Extraocular Movements: Extraocular movements intact.     Pupils: Pupils are equal, round, and reactive to light.  Musculoskeletal:       Back:     Comments: Approximately 3 cm diameter of erythema, area is raised, but becoming smaller.  Slowly improving, however still draining.  Purulent drainage originating deep to wound.  Skin:    General: Skin is warm and dry.     Coloration: Skin is not jaundiced or  pale.     Findings: No rash.  Neurological:     Mental Status: He is alert and oriented to person, place, and time. Mental status is at baseline.  Psychiatric:        Mood and Affect: Mood normal.        Behavior: Behavior normal.        Thought Content: Thought content normal.        Judgment: Judgment normal.    No results found. No results found. No results found for this or any previous visit (from the past 24 hours).  Assessment/Plan: [name] is a 68 y.o. male present for OV for  Back abscess (Primary)/epidermal cysts Rather significant abscess/inflammatory process of his back which contains at least 3-4 medium-large sized clustered epidermal cysts that are inflamed.  Wound culture collected prior to antibiotic: Many WBCs, no bacteria Continue Bactrim DS twice daily x 7 days Wound cleansed today with Hibiclens and sterile saline.  X 1 Steri-Strip applied through center of incision, allowing drainage from bilateral sides of incision.  And there are other multiple epidermal cysts that have been extracted over the last 4 days that also have the ability to drain.  Sterile pressure dressing applied. Patient provided with tape, sterile gauze and Telfa, Bactroban ointment prescribed.  Patient was encouraged to cleanse area daily at least  with warm water in the shower, encouraged to change dressing every 24 hours. Will order home health nurse to come out to help with dressing changes/wound care. I suspect there is a deeper abscess present that cannot be safely cared for in the outpatient setting.  He is also struggling with caring for the abscess with cleansing and wound care given the location is on his back.   Diabetes condition is also possibly complicating healing process. -Home health order placed for nurse-wound care/dressing change daily to start immediately. -Finish antibiotics which takes him through Monday. -Surgical referral placed urgently, near [place], pt has  transportation difficulties.  If he has not heard from surgery by Wednesday, he is to call the office so that we can facilitate an urgent referral.  If area is worsening over the weekend: (fever, chills, pain, bleeding), he needs to be seen in the emergency room. Reviewed expectations re: course of current medical issues. Discussed self-management of symptoms. Outlined signs and symptoms indicating need for more acute intervention. Patient verbalized understanding and all questions were answered. Patient received an After-Visit Summary.    Orders Placed This Encounter  Procedures   Ambulatory referral to General Surgery   Ambulatory referral to Home Health   Meds ordered this encounter  Medications   mupirocin ointment (BACTROBAN) 2 %    Sig: Apply 1 Application topically 2 (two) times daily for 21 days.    Dispense:  45 g    Refill:  0   Referral Orders         Ambulatory referral to General Surgery         Ambulatory referral to Home Health       Note is dictated utilizing voice recognition software. Although note has been proof read prior to signing, occasional typographical errors still can be missed. If any questions arise, please do not hesitate to call for verification.   electronically signed by:  Felix Pacini, DO  [facility] - [place]

## 2023-12-18 ENCOUNTER — Telehealth: Payer: Self-pay

## 2023-12-18 NOTE — Telephone Encounter (Signed)
Copied and pasted CRM:  Reason for CRM: Patient states he was referred to see a back surgeon by Dr. Claiborne Billings. The office where his referral was sent called him to setup an appointment. Patient was informed he would be unable to schedule because he still has an infection. Patient is requesting a call back at [phone] with further instruction.

## 2023-12-19 ENCOUNTER — Other Ambulatory Visit: Payer: Self-pay | Admitting: Family Medicine

## 2023-12-19 NOTE — Telephone Encounter (Signed)
I placed home health nurse order Friday 1/17. Please check on this order. He needs to be seen sooner by surgery. His wound needs attention and We can not perform and I &D of that caliber in our office setting. Wound/drainage is deeper than typical I&D.   Please make contact with both the above and get patient seen by both ASAP

## 2023-12-19 NOTE — Telephone Encounter (Signed)
Patient was transferred by contact center agent. Patient states he called 3 times yesterday and no one would transfer call to our office.  Surgeon's office called patient, his surgery is scheduled for 01/02/24.  Infection has to be completely gone before they will do surgery. Patient is unsure if he needs another round of antibiotics.  Patient is requesting for referral for home health nurse to come out for wound care OR we can get transportation for patient to come here to check on wound. [name] is his transportation and is hard for her to make multiple visits in a week. (Patient had 3 appts last week with Dr. Claiborne Billings)  Patient would like update this afternoon. Please call [phone].

## 2023-12-20 ENCOUNTER — Telehealth: Payer: Self-pay | Admitting: *Deleted

## 2023-12-20 DIAGNOSIS — I350 Nonrheumatic aortic (valve) stenosis: Secondary | ICD-10-CM | POA: Diagnosis not present

## 2023-12-20 DIAGNOSIS — E113299 Type 2 diabetes mellitus with mild nonproliferative diabetic retinopathy without macular edema, unspecified eye: Secondary | ICD-10-CM | POA: Diagnosis not present

## 2023-12-20 DIAGNOSIS — L720 Epidermal cyst: Secondary | ICD-10-CM | POA: Diagnosis not present

## 2023-12-20 DIAGNOSIS — E1142 Type 2 diabetes mellitus with diabetic polyneuropathy: Secondary | ICD-10-CM | POA: Diagnosis not present

## 2023-12-20 DIAGNOSIS — G4700 Insomnia, unspecified: Secondary | ICD-10-CM | POA: Diagnosis not present

## 2023-12-20 DIAGNOSIS — E785 Hyperlipidemia, unspecified: Secondary | ICD-10-CM | POA: Diagnosis not present

## 2023-12-20 DIAGNOSIS — Z794 Long term (current) use of insulin: Secondary | ICD-10-CM | POA: Diagnosis not present

## 2023-12-20 DIAGNOSIS — I2510 Atherosclerotic heart disease of native coronary artery without angina pectoris: Secondary | ICD-10-CM | POA: Diagnosis not present

## 2023-12-20 DIAGNOSIS — M549 Dorsalgia, unspecified: Secondary | ICD-10-CM | POA: Diagnosis not present

## 2023-12-20 DIAGNOSIS — L02212 Cutaneous abscess of back [any part, except buttock]: Secondary | ICD-10-CM | POA: Diagnosis not present

## 2023-12-20 DIAGNOSIS — E1169 Type 2 diabetes mellitus with other specified complication: Secondary | ICD-10-CM | POA: Diagnosis not present

## 2023-12-20 DIAGNOSIS — Z7982 Long term (current) use of aspirin: Secondary | ICD-10-CM | POA: Diagnosis not present

## 2023-12-20 DIAGNOSIS — I77810 Thoracic aortic ectasia: Secondary | ICD-10-CM | POA: Diagnosis not present

## 2023-12-20 DIAGNOSIS — M47817 Spondylosis without myelopathy or radiculopathy, lumbosacral region: Secondary | ICD-10-CM | POA: Diagnosis not present

## 2023-12-20 DIAGNOSIS — M51369 Other intervertebral disc degeneration, lumbar region without mention of lumbar back pain or lower extremity pain: Secondary | ICD-10-CM | POA: Diagnosis not present

## 2023-12-20 DIAGNOSIS — I119 Hypertensive heart disease without heart failure: Secondary | ICD-10-CM | POA: Diagnosis not present

## 2023-12-20 DIAGNOSIS — G8929 Other chronic pain: Secondary | ICD-10-CM | POA: Diagnosis not present

## 2023-12-20 DIAGNOSIS — Z7984 Long term (current) use of oral hypoglycemic drugs: Secondary | ICD-10-CM | POA: Diagnosis not present

## 2023-12-20 NOTE — Telephone Encounter (Signed)
HH did not get everything approved until the 20th. Nurse is going out today to see pt  Surgery recommended that pt go to ED.  Spoke with PCP and was verbally advised to tell pt to go to ED for wound care at this time.

## 2023-12-20 NOTE — Telephone Encounter (Signed)
Received call from Erie Noe, CMA with [facility] at [facility].   Reports that patient requires deep incision and drainage of abscess to back that PCP is unable to perform in office setting. Reports that patient requires immediate attention to area as the drainage appears to be coming from deeper than typical I&D. Chart notes from 12/15/2023 report that PCP suspected deeper abscess that cannot be cared for in outpatient setting.   Advised that office does not have provider in clinic daily as outpatient surgery is non- emergent. Advised that typical deep excisions are not performed if actively infected, but further I&D can be done if required in OR after consult.   Advised if immediate attention is required, patient should go to ER for evaluation. Verbalized understanding.

## 2023-12-20 NOTE — Telephone Encounter (Signed)
Advised pt of status and pt states that the home health nurse is currently at his home. Pt was advised that surgeon and PCP has recommended he go to ED. Pt states he will go today or tomorrow.

## 2023-12-21 ENCOUNTER — Emergency Department (HOSPITAL_COMMUNITY)
Admission: EM | Admit: 2023-12-21 | Discharge: 2023-12-21 | Disposition: A | Discharge status: Home / Self Care | Payer: 59 | Attending: Emergency Medicine | Admitting: Emergency Medicine

## 2023-12-21 ENCOUNTER — Other Ambulatory Visit: Payer: Self-pay

## 2023-12-21 ENCOUNTER — Encounter (HOSPITAL_COMMUNITY): Payer: Self-pay | Admitting: *Deleted

## 2023-12-21 ENCOUNTER — Other Ambulatory Visit: Payer: Self-pay | Admitting: *Deleted

## 2023-12-21 DIAGNOSIS — L02212 Cutaneous abscess of back [any part, except buttock]: Secondary | ICD-10-CM | POA: Diagnosis not present

## 2023-12-21 DIAGNOSIS — Z7982 Long term (current) use of aspirin: Secondary | ICD-10-CM | POA: Insufficient documentation

## 2023-12-21 DIAGNOSIS — Z794 Long term (current) use of insulin: Secondary | ICD-10-CM | POA: Insufficient documentation

## 2023-12-21 DIAGNOSIS — L720 Epidermal cyst: Secondary | ICD-10-CM

## 2023-12-21 LAB — CBC WITH DIFFERENTIAL/PLATELET
Abs Immature Granulocytes: 0.03 10*3/uL (ref 0.00–0.07)
Basophils Absolute: 0.1 10*3/uL (ref 0.0–0.1)
Basophils Relative: 1 %
Eosinophils Absolute: 0.1 10*3/uL (ref 0.0–0.5)
Eosinophils Relative: 2 %
HCT: 47 % (ref 39.0–52.0)
Hemoglobin: 15.7 g/dL (ref 13.0–17.0)
Immature Granulocytes: 0 %
Lymphocytes Relative: 23 %
Lymphs Abs: 1.8 10*3/uL (ref 0.7–4.0)
MCH: 30.4 pg (ref 26.0–34.0)
MCHC: 33.4 g/dL (ref 30.0–36.0)
MCV: 91.1 fL (ref 80.0–100.0)
Monocytes Absolute: 0.7 10*3/uL (ref 0.1–1.0)
Monocytes Relative: 9 %
Neutro Abs: 5 10*3/uL (ref 1.7–7.7)
Neutrophils Relative %: 65 %
Platelets: 225 10*3/uL (ref 150–400)
RBC: 5.16 MIL/uL (ref 4.22–5.81)
RDW: 13.1 % (ref 11.5–15.5)
WBC: 7.7 10*3/uL (ref 4.0–10.5)
nRBC: 0 % (ref 0.0–0.2)

## 2023-12-21 LAB — BASIC METABOLIC PANEL
Anion gap: 10 (ref 5–15)
BUN: 18 mg/dL (ref 8–23)
CO2: 24 mmol/L (ref 22–32)
Calcium: 9.5 mg/dL (ref 8.9–10.3)
Chloride: 104 mmol/L (ref 98–111)
Creatinine, Ser: 0.71 mg/dL (ref 0.61–1.24)
GFR, Estimated: >60 mL/min (ref >60)
Glucose, Bld: 152 mg/dL — ABNORMAL HIGH (ref 70–99)
Potassium: 3.9 mmol/L (ref 3.5–5.1)
Sodium: 138 mmol/L (ref 135–145)

## 2023-12-21 LAB — LACTIC ACID, PLASMA: Lactic Acid, Venous: 1 mmol/L (ref 0.5–1.9)

## 2023-12-21 MED ORDER — LIDOCAINE HCL (PF) 1 % IJ SOLN
30.0000 mL | Freq: Once | INTRAMUSCULAR | Status: AC
  Administered 2023-12-21: 30 mL
  Filled 2023-12-21: qty 30

## 2023-12-21 MED ORDER — SULFAMETHOXAZOLE-TRIMETHOPRIM 800-160 MG PO TABS: 1.0000 | ORAL_TABLET | Freq: Two times a day (BID) | ORAL | 0 refills | Status: AC

## 2023-12-21 NOTE — Patient Outreach (Signed)
Care Management   Visit Note  12/21/2023 Name: [name] MRN: [id number] DOB: 1956-05-11  Subjective: [name] is a 68 y.o. year old male who is a primary care patient of Kuneff, Renee A, DO. The Care Management team was consulted for assistance.      Engaged with patient spoke with patient by telephone.    Goals Addressed             This Visit's Progress    RNCM Care Management Expected Outcomes: Monitor, Self-Mange and Reduce Symptoms of: DM, HTN       Current Barriers:  Knowledge Deficits related to plan of care for management of HTN and DMII  Chronic Disease Management support and education needs related to HTN and DMII   RNCM Clinical Goal(s):  Patient will verbalize basic understanding of  HTN and DMII disease process and self health management plan as evidenced by verbal explanation, recognizing symptoms, lifestyle modifications take all medications exactly as prescribed and will call provider for medication related questions as evidenced by compliance with all medications attend all scheduled medical appointments: with primary care provider and specialist as evidenced by keeping all scheduled appointments demonstrate Improved and Ongoing adherence to prescribed treatment plan for HTN and DMII as evidenced by consistent medication compliance, symptom monitoring and continued lifestyle modifications continue to work with RN Care Manager to address care management and care coordination needs related to  HTN and DMII as evidenced by adherence to CM Team Scheduled appointments through collaboration with RN Care manager, provider, and care team.   Interventions: Evaluation of current treatment plan related to  self management and patient's adherence to plan as established by provider   Diabetes Interventions:  (Status:  Goal on track:  NO.) Long Term Goal Assessed patient's understanding of A1c goal: <6.5% Provided education to patient about basic DM disease process.  States he does check his blood sugar daily. His fasting this morning was 160. The lowest: 138 highest: 214. RNCM reviewed with patient fasting goal <130 post prandial <180. Continues to not follow his prescribed diet. Patient states that his blood sugars have been up and down as he has been dealing with an abscess. He has wound care coming to his home 3x/wk to change his dressing. He does have an upcoming surgical office visit. Reviewed medications with patient and discussed importance of medication adherence. Reports compliance with medications. Counseled on importance of regular laboratory monitoring as prescribed Discussed plans with patient for ongoing care management follow up and provided patient with direct contact information for care management team Provided patient with written educational materials related to hypo and hyperglycemia and importance of correct treatment Reviewed scheduled/upcoming provider appointments including: 02-19-2024 with PCP Advised patient, providing education and rationale, to check cbg fasting in the morning before breakfast and check before meals and record, calling PCP for findings outside established parameters Review of patient status, including review of consultants reports, relevant laboratory and other test results, and medications completed Screening for signs and symptoms of depression related to chronic disease state  Assessed social determinant of health barriers Lab Results  Component Value Date   HGBA1C 9.0 (H) 11/06/2023    Hypertension Interventions:  (Status:  Goal on track:  Yes.) Long Term Goal Last practice recorded BP readings:  BP Readings from Last 3 Encounters:  12/21/23 118/67  12/15/23 104/60  12/13/23 (!) 100/52   Most recent eGFR/CrCl: No results found for: "EGFR"  No components found for: "CRCL"  Evaluation of current  treatment plan related to hypertension self management and patient's adherence to plan as established by provider.  Reports his last bp check 102/60. Denies any acute changes. Provided education to patient re: stroke prevention, s/s of heart attack and stroke Reviewed medications with patient and discussed importance of compliance. Reports compliance with all medications Counseled on adverse effects of illicit drug and excessive alcohol use in patients with high blood pressure  Counseled on the importance of exercise goals with target of 150 minutes per week. Not currently exercising due to the cold weather but states he will start back once the weather warms back up. Discussed plans with patient for ongoing care management follow up and provided patient with direct contact information for care management team Advised patient, providing education and rationale, to monitor blood pressure daily and record, calling PCP for findings outside established parameters Reviewed scheduled/upcoming provider appointments including: 12-25-2023 with PCP Advised patient to discuss any new changes with provider Provided education on prescribed diet ADA/Heart Healthy Diet Discussed complications of poorly controlled blood pressure such as heart disease, stroke, circulatory complications, vision complications, kidney impairment, sexual dysfunction Assessed social determinant of health barriers  Patient Goals/Self-Care Activities: Take all medications as prescribed Attend all scheduled provider appointments Call pharmacy for medication refills 3-7 days in advance of running out of medications Attend church or other social activities Perform all self care activities independently  Call provider office for new concerns or questions  schedule appointment with eye doctor check feet daily for cuts, sores or redness keep feet up while sitting check blood pressure daily  Follow Up Plan:  Telephone follow up appointment with care management team member scheduled for:  01-22-2024 at 1:00 pm           Consent to Services:   Patient was given information about care management services, agreed to services, and gave verbal consent to participate.   Plan: Telephone follow up appointment with care management team member scheduled for:01-22-2024 at 1:00 pm  Rosalene Billings, BSN RN [facility], [facility] RN Care Manager Direct Dial: [phone]  Fax: [phone]

## 2023-12-21 NOTE — ED Triage Notes (Signed)
Pt c/o multiple abscesses to upper back x 3 weeks; pt states he has been following up with his PCP and was seen by a Home Health nurse yesterday who cleaned and dressed the wound; pt was told to come to ED due to wound is still not healing

## 2023-12-21 NOTE — Patient Instructions (Signed)
Visit Information  Thank you for taking time to visit with me today. Please don't hesitate to contact me if I can be of assistance to you before our next scheduled telephone appointment.  Following are the goals we discussed today:   Goals Addressed             This Visit's Progress    RNCM Care Management Expected Outcomes: Monitor, Self-Mange and Reduce Symptoms of: DM, HTN       Current Barriers:  Knowledge Deficits related to plan of care for management of HTN and DMII  Chronic Disease Management support and education needs related to HTN and DMII   RNCM Clinical Goal(s):  Patient will verbalize basic understanding of  HTN and DMII disease process and self health management plan as evidenced by verbal explanation, recognizing symptoms, lifestyle modifications take all medications exactly as prescribed and will call provider for medication related questions as evidenced by compliance with all medications attend all scheduled medical appointments: with primary care provider and specialist as evidenced by keeping all scheduled appointments demonstrate Improved and Ongoing adherence to prescribed treatment plan for HTN and DMII as evidenced by consistent medication compliance, symptom monitoring and continued lifestyle modifications continue to work with RN Care Manager to address care management and care coordination needs related to  HTN and DMII as evidenced by adherence to CM Team Scheduled appointments through collaboration with RN Care manager, provider, and care team.   Interventions: Evaluation of current treatment plan related to  self management and patient's adherence to plan as established by provider   Diabetes Interventions:  (Status:  Goal on track:  NO.) Long Term Goal Assessed patient's understanding of A1c goal: <6.5% Provided education to patient about basic DM disease process. States he does check his blood sugar daily. His fasting this morning was 160. The lowest:  138 highest: 214. RNCM reviewed with patient fasting goal <130 post prandial <180. Continues to not follow his prescribed diet. Patient states that his blood sugars have been up and down as he has been dealing with an abscess. He has wound care coming to his home 3x/wk to change his dressing. He does have an upcoming surgical office visit. Reviewed medications with patient and discussed importance of medication adherence. Reports compliance with medications. Counseled on importance of regular laboratory monitoring as prescribed Discussed plans with patient for ongoing care management follow up and provided patient with direct contact information for care management team Provided patient with written educational materials related to hypo and hyperglycemia and importance of correct treatment Reviewed scheduled/upcoming provider appointments including: 02-19-2024 with PCP Advised patient, providing education and rationale, to check cbg fasting in the morning before breakfast and check before meals and record, calling PCP for findings outside established parameters Review of patient status, including review of consultants reports, relevant laboratory and other test results, and medications completed Screening for signs and symptoms of depression related to chronic disease state  Assessed social determinant of health barriers Lab Results  Component Value Date   HGBA1C 9.0 (H) 11/06/2023    Hypertension Interventions:  (Status:  Goal on track:  Yes.) Long Term Goal Last practice recorded BP readings:  BP Readings from Last 3 Encounters:  12/21/23 118/67  12/15/23 104/60  12/13/23 (!) 100/52   Most recent eGFR/CrCl: No results found for: "EGFR"  No components found for: "CRCL"  Evaluation of current treatment plan related to hypertension self management and patient's adherence to plan as established by provider. Reports his last bp  check 102/60. Denies any acute changes. Provided education to  patient re: stroke prevention, s/s of heart attack and stroke Reviewed medications with patient and discussed importance of compliance. Reports compliance with all medications Counseled on adverse effects of illicit drug and excessive alcohol use in patients with high blood pressure  Counseled on the importance of exercise goals with target of 150 minutes per week. Not currently exercising due to the cold weather but states he will start back once the weather warms back up. Discussed plans with patient for ongoing care management follow up and provided patient with direct contact information for care management team Advised patient, providing education and rationale, to monitor blood pressure daily and record, calling PCP for findings outside established parameters Reviewed scheduled/upcoming provider appointments including: 12-25-2023 with PCP Advised patient to discuss any new changes with provider Provided education on prescribed diet ADA/Heart Healthy Diet Discussed complications of poorly controlled blood pressure such as heart disease, stroke, circulatory complications, vision complications, kidney impairment, sexual dysfunction Assessed social determinant of health barriers  Patient Goals/Self-Care Activities: Take all medications as prescribed Attend all scheduled provider appointments Call pharmacy for medication refills 3-7 days in advance of running out of medications Attend church or other social activities Perform all self care activities independently  Call provider office for new concerns or questions  schedule appointment with eye doctor check feet daily for cuts, sores or redness keep feet up while sitting check blood pressure daily  Follow Up Plan:  Telephone follow up appointment with care management team member scheduled for:  01-22-2024 at 1:00 pm           Our next appointment is by telephone on 01-22-2024 at 1:00 pm  Please call the care guide team at [phone]  if you need to cancel or reschedule your appointment.   If you are experiencing a Mental Health or Behavioral Health Crisis or need someone to talk to, please call the Suicide and Crisis Lifeline: 988 call the [place] National Suicide Prevention Lifeline: 217-636-0338 or TTY: 314 251 5678 TTY 930-636-6469) to talk to a trained counselor call 1-800-273-TALK (toll free, 24 hour hotline)   Patient verbalizes understanding of instructions and care plan provided today and agrees to view in MyChart. Active MyChart status and patient understanding of how to access instructions and care plan via MyChart confirmed with patient.     Telephone follow up appointment with care management team member scheduled for:01-22-2024 at 1:00 pm  Rosalene Billings, BSN RN [facility], [facility] RN Care Manager Direct Dial: [phone]  Fax: [phone]

## 2023-12-21 NOTE — Telephone Encounter (Signed)
Home health orders received 12/21/23 for Nada B[facility] health initiation orders: Yes.  Home health re-certification orders: No. Patient last seen by ordering physician for this condition: 12/15/23. Must be less than 90 days for re-certification and less than 30 days prior for initiation. Visit must have been for the condition the orders are being placed.  Patient meets criteria for Physician to sign orders: Yes.        Current med list has been attached: No        Orders placed on physicians desk for signature: 12/21/23 (date) If patient does not meet criteria for orders to be signed: pt was called to schedule appt. Appt is scheduled for n/a.   Filomena Jungling

## 2023-12-21 NOTE — ED Provider Notes (Signed)
Care of the patient assumed at signout. Patient in no distress, awake, alert,, patient recheck in 2 days.   Gerhard Munch, MD 12/21/23 [phone]

## 2023-12-21 NOTE — Discharge Instructions (Signed)
Continue taking the Bactrim as prescribed.  Return to the ED Saturday for recheck and packing removal.

## 2023-12-21 NOTE — ED Provider Notes (Signed)
[place] EMERGENCY DEPARTMENT AT [facility] Provider Note   CSN: [id number] Arrival date & time: 12/21/23  9147     History  Chief Complaint  Patient presents with   Abscess    [name] is a 68 y.o. male.  Patient comes to the emergency department for evaluation of abscess on his back.  Patient reports that the area has been there for 3 weeks.  His primary doctor performed an incision and drainage and put him on antibiotics.  The area is still draining and has not healed.  He was sent to the ED to "make sure he did not go septic".       Home Medications Prior to Admission medications   Medication Sig Start Date End Date Taking? Authorizing Provider  Accu-Chek FastClix Lancets MISC  05/11/19   [provider]  aspirin EC 81 MG tablet Take 1 tablet (81 mg total) by mouth daily. 03/25/19   Lewayne Bunting, MD  dapagliflozin propanediol (FARXIGA) 10 MG TABS tablet TAKE 1 TABLET BY MOUTH  DAILY BEFORE BREAKFAST 10/24/23   Kuneff, Renee A, DO  diazepam (VALIUM) 5 MG tablet Take 1 tablet (5 mg total) by mouth every 6 (six) hours as needed for anxiety. 11/06/23   Kuneff, Renee A, DO  insulin glargine (LANTUS SOLOSTAR) 100 UNIT/ML Solostar Pen Inject 65 Units into the skin daily. 10/24/23 11/23/23  Kuneff, Renee A, DO  JANUMET XR [phone] MG TB24 Take 1 tablet by mouth daily. 10/24/23   Kuneff, Renee A, DO  lisinopril (ZESTRIL) 20 MG tablet Take 1 tablet (20 mg total) by mouth daily. 10/24/23   Kuneff, Renee A, DO  mupirocin ointment (BACTROBAN) 2 % Apply 1 Application topically 2 (two) times daily for 21 days. 12/15/23 01/05/24  Kuneff, Renee A, DO  naproxen (NAPROSYN) 500 MG tablet TAKE 1 TABLET BY MOUTH EVERY 12 HOURS W/FOOD AS NEEDED ONLY *NEW PRESCRIPTION REQUEST* 12/07/23   Kuneff, Renee A, DO  nitroGLYCERIN (NITROSTAT) 0.4 MG SL tablet DISSOLVE 1 TABLET UNDER THE TONGUE EVERY 5 MINUTES AS  NEEDED FOR CHEST PAIN. MAX  OF 3 TABLETS IN 15 MINUTES. CALL 911 IF PAIN  PERSISTS. 10/08/20   Lewayne Bunting, MD  omega-3 acid ethyl esters (LOVAZA) 1 g capsule Take 1 capsule (1 g total) by mouth daily. 03/24/23   Kuneff, Renee A, DO  ONETOUCH VERIO test strip USE AS DIRECTED *NEW PRESCRIPTION REQUEST* 12/07/23   Kuneff, Renee A, DO  rosuvastatin (CRESTOR) 40 MG tablet Take 1 tablet (40 mg total) by mouth at bedtime. 10/30/23   Kuneff, Renee A, DO  traZODone (DESYREL) 100 MG tablet Take 1 tablet (100 mg total) by mouth at bedtime as needed for sleep. 07/14/23   Kuneff, Renee A, DO  ULTICARE PEN NEEDLES 31G X 5 MM MISC USE TO INJECT PEN INSULIN *NEW PRESCRIPTION REQUEST* 12/07/23   Kuneff, Renee A, DO      Allergies    Gabapentin, Hydrocodone, Nsaids, Prednisone, Tramadol, and Penicillins    Review of Systems   Review of Systems  Physical Exam Updated Vital Signs BP 135/64 (BP Location: Right Arm)   Pulse (!) 59   Temp 98.5 F (36.9 C) (Oral)   Resp 16   Ht 5\' 11"  (1.803 m)   Wt 97.5 kg   SpO2 93%   BMI 29.99 kg/m  Physical Exam Vitals and nursing note reviewed.  Constitutional:      General: He is not in acute distress.  Appearance: He is well-developed.  HENT:     Head: Normocephalic and atraumatic.     Mouth/Throat:     Mouth: Mucous membranes are moist.  Eyes:     General: Vision grossly intact. Gaze aligned appropriately.     Extraocular Movements: Extraocular movements intact.     Conjunctiva/sclera: Conjunctivae normal.  Cardiovascular:     Rate and Rhythm: Normal rate and regular rhythm.     Pulses: Normal pulses.     Heart sounds: Normal heart sounds, S1 normal and S2 normal. No murmur heard.    No friction rub. No gallop.  Pulmonary:     Effort: Pulmonary effort is normal. No respiratory distress.     Breath sounds: Normal breath sounds.  Abdominal:     Palpations: Abdomen is soft.     Tenderness: There is no abdominal tenderness. There is no guarding or rebound.     Hernia: No hernia is present.  Musculoskeletal:         General: No swelling.     Cervical back: Full passive range of motion without pain, normal range of motion and neck supple. No pain with movement, spinous process tenderness or muscular tenderness. Normal range of motion.     Right lower leg: No edema.     Left lower leg: No edema.  Skin:    General: Skin is warm and dry.     Capillary Refill: Capillary refill takes less than 2 seconds.     Findings: Wound (Right upper back - erythema, induration: white drainage present when squeezed) present. No ecchymosis, erythema or lesion.  Neurological:     Mental Status: He is alert and oriented to person, place, and time.     GCS: GCS eye subscore is 4. GCS verbal subscore is 5. GCS motor subscore is 6.     Cranial Nerves: Cranial nerves 2-12 are intact.     Sensory: Sensation is intact.     Motor: Motor function is intact. No weakness or abnormal muscle tone.     Coordination: Coordination is intact.  Psychiatric:        Mood and Affect: Mood normal.        Speech: Speech normal.        Behavior: Behavior normal.     ED Results / Procedures / Treatments   Labs (all labs ordered are listed, but only abnormal results are displayed) Labs Reviewed  CULTURE, BLOOD (SINGLE)  AEROBIC CULTURE W GRAM STAIN (SUPERFICIAL SPECIMEN)  CBC WITH DIFFERENTIAL/PLATELET  BASIC METABOLIC PANEL  LACTIC ACID, PLASMA    EKG None  Radiology No results found.  Procedures .Incision and Drainage  Date/Time: 12/21/2023 6:45 AM  Performed by: Gilda Crease, MD Authorized by: Gilda Crease, MD   Consent:    Consent obtained:  Verbal   Consent given by:  Patient   Risks, benefits, and alternatives were discussed: yes     Risks discussed:  Bleeding, incomplete drainage and pain Universal protocol:    Procedure explained and questions answered to patient or proxy's satisfaction: yes     Relevant documents present and verified: yes     Test results available : yes     Required blood  products, implants, devices, and special equipment available: yes     Site/side marked: yes     Immediately prior to procedure, a time out was called: yes     Patient identity confirmed:  Verbally with patient Location:    Type:  Cyst   Size:  3cmx5cm   Location:  Trunk   Trunk location:  Back Pre-procedure details:    Skin preparation:  Chlorhexidine with alcohol Sedation:    Sedation type:  None Anesthesia:    Anesthesia method:  Local infiltration   Local anesthetic:  Lidocaine 1% w/o epi Procedure type:    Complexity:  Simple Procedure details:    Incision types:  Single straight   Wound management:  Probed and deloculated and irrigated with saline   Drainage characteristics: sebaceous.   Drainage amount:  Moderate   Packing materials:  1/2 in iodoform gauze Post-procedure details:    Procedure completion:  Tolerated well, no immediate complications     Medications Ordered in ED Medications  lidocaine (PF) (XYLOCAINE) 1 % injection 30 mL (30 mLs Other Given by Other 12/21/23 2956)    ED Course/ Medical Decision Making/ A&P                                 Medical Decision Making Amount and/or Complexity of Data Reviewed Labs: ordered.  Risk Prescription drug management.   Comes to the ED with concerns over possible sepsis secondary to persistent wound on his right upper back.  There is erythema and induration.  Formal I&D site is still open and draining, there are multiple other areas that expressed purulent drainage when squeezed surrounding the area of induration.  Patient appears well.  He is afebrile.  No tachycardia, no hypotension.  Nothing on his initial evaluation would suggest sepsis.  Will perform labs including CBC and lactic acid.  Blood culture obtained.  Will open the area more widely and obtain wound culture.  Reviewing the records, primary doctor did perform a wound culture with initial I&D that did not grow any bacteria.  He is currently on  Bactrim.  Recommended repeating the procedure to open up more widely and thoroughly drained the area.  He did give consent.  When I opened up the wound area I only obtained sebaceous material, no frank pus.  This combined with his original culture lab was negative likely means that this was an inflamed cyst, rather than an abscess.  There is some redness and induration around the original incision site, recommend continuing Bactrim.  Packing to be removed in 2 days, patient instructed to return to the ED as it is the weekend.  Culture obtained today.        Final Clinical Impression(s) / ED Diagnoses Final diagnoses:  Cyst of skin and subcutaneous tissue    Rx / DC Orders ED Discharge Orders     None         Gilda Crease, MD 12/21/23 [phone]

## 2023-12-22 NOTE — Telephone Encounter (Signed)
Completed and placed on CMA work basket

## 2023-12-22 NOTE — Telephone Encounter (Signed)
Faxed

## 2023-12-25 ENCOUNTER — Ambulatory Visit (INDEPENDENT_AMBULATORY_CARE_PROVIDER_SITE_OTHER): Payer: 59 | Admitting: Family Medicine

## 2023-12-25 VITALS — BP 110/62 | HR 60 | Temp 97.8°F | Wt 217.2 lb

## 2023-12-25 DIAGNOSIS — L720 Epidermal cyst: Secondary | ICD-10-CM

## 2023-12-25 LAB — AEROBIC CULTURE W GRAM STAIN (SUPERFICIAL SPECIMEN)

## 2023-12-25 MED ORDER — LANTUS SOLOSTAR 100 UNIT/ML ~~LOC~~ SOPN: 70.0000 [IU] | PEN_INJECTOR | Freq: Every day | SUBCUTANEOUS | 5 refills | Status: DC

## 2023-12-25 MED ORDER — ROSUVASTATIN CALCIUM 40 MG PO TABS: 40.0000 mg | ORAL_TABLET | Freq: Every day | ORAL | 0 refills | Status: DC

## 2023-12-25 MED ORDER — OMEGA-3-ACID ETHYL ESTERS 1 G PO CAPS: 1.0000 | ORAL_CAPSULE | Freq: Every day | ORAL | 2 refills | Status: DC

## 2023-12-25 MED ORDER — TRAZODONE HCL 100 MG PO TABS: 100.0000 mg | ORAL_TABLET | Freq: Every evening | ORAL | 1 refills | Status: DC | PRN

## 2023-12-25 NOTE — Progress Notes (Signed)
All      [name] , 11/28/1956, 68 y.o., male MRN: [id number] Patient Care Team    Relationship Specialty Notifications Start End  Daniel Leatherwood, DO PCP - General Family Medicine  11/29/18   Daniel Chris, MD Consulting Physician Ophthalmology  01/09/19   Daniel Poche, MD Consulting Physician Cardiology  07/19/23   Ricky Stabs, RN [facility] Care Management General Practice  11/14/23    Comment: [phone]    Chief Complaint  Patient presents with   Epidermal Cyst     Subjective: [name] is a 68 y.o. Pt presents for an OV to follow-up on multiple epidermal cysts right upper back.   Since last visit patient has been urged to go to the emergency room as there was not more we could offer here in patient office and surgery could not get him in to be seen soon, as well as home health nurse that get out to see him for follow-up.  In the ED I&D was completed again, they were able to evaluate cyst more thoroughly and irrigate.  Today patient reports the nurse has not been out again, but they are scheduled in 2 days.  He has continued the bactrim.  Denies fever  Prior note Patient was seen on Monday and Wednesday of this week.  Initial I&D completed on Monday, repeat I&D required on Wednesday.  Urine culture shows no growth.  Patient is taking Bactrim as prescribed.  There are multiple epidermal cysts in this location that have been draining. Today he states he is unable to change the dressing himself and does not have any assistance in the house currently.  His sister has not been able to make it over to help him.  He denies any fevers or chills.  Wound continues to drain.  He is finishing the antibiotics prescribed.  Wound culture negative.  Component Ref Range & Units (hover) 4 d ago  MICRO NUMBER: [id number]  SPECIMEN QUALITY: Adequate  SOURCE: WOUND (SITE NOT SPECIFIED)  STATUS: FINAL  GRAM STAIN: Many White blood cells seen Few epithelial cells No organisms seen   RESULT: No Growth  Resulting Agency [facility] [place]     Prior note: Patient was seen 2 days ago with complaints of back cyst of 12 days duration.  Associated symptoms included blood tinge drainage and green drainage.  He states he has had cysts on his back before in a similar location that required I&D.  Patient denies fevers or chills.  Patient underwent I&D in the office of his abscess.  Patient reports that has been draining over the last 2 days.  He denies any fevers.  He states he did start the Bactrim DS twice daily.     08/21/2023    1:04 PM 03/24/2023    1:35 PM 12/09/2022    1:43 PM 12/05/2022    9:54 AM 08/19/2022   12:57 PM  Depression screen PHQ 2/9  Decreased Interest 0 0 2 1 0  Down, Depressed, Hopeless 0 0 0 0 0  PHQ - 2 Score 0 0 2 1 0  Altered sleeping   0    Tired, decreased energy   2    Change in appetite   3    Feeling bad or failure about yourself    0    Trouble concentrating   2    Moving slowly or fidgety/restless   0    Suicidal thoughts   0    PHQ-9 Score  9      Allergies  Allergen Reactions   Gabapentin Other (See Comments)    Paranoia/hallucinations   Hydrocodone Other (See Comments)    PT reports  Confusion when tacking Hydrocodone   Nsaids     History of heart disease, CAD with history of MI, CABG status post stent   Prednisone     Paranoia   Tramadol    Penicillins Other (See Comments)    Childhood unsure if still allgeric   Social History   Social History Narrative   Marital status/children/pets: Single   Education/employment: HS grad, 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:      -smoke alarm in the home:Yes     - wears seatbelt: Yes      Past Medical History:  Diagnosis Date   Alcohol addiction (HCC)    Anxiety    Arthritis    Bulging of lumbar intervertebral disc 2017   MRI: L3-4, L4-5 and L5-S1- facet hypertrophy and formainal stenosis   CAD (coronary artery disease)    Coronary artery disease with hx of  myocardial infarct w/o hx of CABG 2004   Depression    Dextrocardia    Diabetes (HCC)    Heart attack (HCC) 2004   Stent placement 2008   Hyperlipidemia    Hypertension    Insomnia    Past Surgical History:  Procedure Laterality Date   CORONARY ANGIOPLASTY WITH STENT PLACEMENT  2008   Family History  Problem Relation Age of Onset   Heart attack Mother    Heart disease Mother    Diabetes Mother    Arthritis Mother    Hyperlipidemia Mother    Hypertension Mother    Breast cancer Mother    Heart attack Father    Heart disease Father    Alcohol abuse Father    Early death Father    Arthritis Sister    Heart disease Sister    Hyperlipidemia Sister    Asthma Sister    COPD Sister    Diabetes Sister    Alcohol abuse Sister    Allergies as of 12/25/2023       Reactions   Gabapentin Other (See Comments)   Paranoia/hallucinations   Hydrocodone Other (See Comments)   PT reports  Confusion when tacking Hydrocodone   Nsaids    History of heart disease, CAD with history of MI, CABG status post stent   Prednisone    Paranoia   Tramadol    Penicillins Other (See Comments)   Childhood unsure if still allgeric        Medication List        Accurate as of December 25, 2023  1:26 PM. If you have any questions, ask your nurse or doctor.          Accu-Chek FastClix Lancets Misc   aspirin EC 81 MG tablet Take 1 tablet (81 mg total) by mouth daily.   dapagliflozin propanediol 10 MG Tabs tablet Commonly known as: Farxiga TAKE 1 TABLET BY MOUTH  DAILY BEFORE BREAKFAST   diazepam 5 MG tablet Commonly known as: VALIUM Take 1 tablet (5 mg total) by mouth every 6 (six) hours as needed for anxiety.   Janumet XR [phone] MG Tb24 Generic drug: SitaGLIPtin-MetFORMIN HCl Take 1 tablet by mouth daily.   Lantus SoloStar 100 UNIT/ML Solostar Pen Generic drug: insulin glargine Inject 65 Units into the skin daily.   lisinopril 20 MG tablet Commonly known as: ZESTRIL Take 1  tablet (20 mg total) by  mouth daily.   mupirocin ointment 2 % Commonly known as: BACTROBAN Apply 1 Application topically 2 (two) times daily for 21 days.   naproxen 500 MG tablet Commonly known as: NAPROSYN TAKE 1 TABLET BY MOUTH EVERY 12 HOURS W/FOOD AS NEEDED ONLY *NEW PRESCRIPTION REQUEST*   nitroGLYCERIN 0.4 MG SL tablet Commonly known as: NITROSTAT DISSOLVE 1 TABLET UNDER THE TONGUE EVERY 5 MINUTES AS  NEEDED FOR CHEST PAIN. MAX  OF 3 TABLETS IN 15 MINUTES. CALL 911 IF PAIN PERSISTS.   omega-3 acid ethyl esters 1 g capsule Commonly known as: LOVAZA Take 1 capsule (1 g total) by mouth daily.   OneTouch Verio test strip Generic drug: glucose blood USE AS DIRECTED *NEW PRESCRIPTION REQUEST*   rosuvastatin 40 MG tablet Commonly known as: CRESTOR Take 1 tablet (40 mg total) by mouth at bedtime.   sulfamethoxazole-trimethoprim 800-160 MG tablet Commonly known as: BACTRIM DS Take 1 tablet by mouth 2 (two) times daily for 5 days.   traZODone 100 MG tablet Commonly known as: DESYREL Take 1 tablet (100 mg total) by mouth at bedtime as needed for sleep.   UltiCare Pen Needles 31G X 5 MM Misc Generic drug: Insulin Pen Needle USE TO INJECT PEN INSULIN *NEW PRESCRIPTION REQUEST*        All past medical history, surgical history, allergies, family history, immunizations andmedications were updated in the EMR today and reviewed under the history and medication portions of their EMR.     ROS Negative, with the exception of above mentioned in HPI   Objective:  BP 110/62   Pulse 60   Temp 97.8 F (36.6 C)   Wt 217 lb 3.2 oz (98.5 kg)   SpO2 98%   BMI 30.29 kg/m  Body mass index is 30.29 kg/m. Physical Exam Vitals and nursing note reviewed.  Constitutional:      General: He is not in acute distress.    Appearance: Normal appearance. He is not ill-appearing, toxic-appearing or diaphoretic.  HENT:     Head: Normocephalic and atraumatic.  Eyes:     General: No scleral  icterus.       Right eye: No discharge.        Left eye: No discharge.     Extraocular Movements: Extraocular movements intact.     Pupils: Pupils are equal, round, and reactive to light.  Musculoskeletal:       Back:     Comments: Approximately 3 cm diameter of erythema, area is raised, but becoming smaller.  Slowly improving, no drainage today  Skin:    General: Skin is warm and dry.     Coloration: Skin is not jaundiced or pale.     Findings: No rash.  Neurological:     Mental Status: He is alert and oriented to person, place, and time. Mental status is at baseline.  Psychiatric:        Mood and Affect: Mood normal.        Behavior: Behavior normal.        Thought Content: Thought content normal.        Judgment: Judgment normal.    No results found. No results found. No results found for this or any previous visit (from the past 24 hours).  Assessment/Plan: ROMAIN ERION is a 68 y.o. male present for OV for  Inflamed epidermal cysts Improving. Does not appear infected today. No drainage present.  1 ic of packing removed, fresh dressing applied.  Finish bactrim Continue daily cleanse and  dressing changes, nurse will come out Wednesday for assistance.  Wound culture collected prior to antibiotic: Many WBCs, no bacteria F/u prn Discussed self-management of symptoms. Outlined signs and symptoms indicating need for more acute intervention. Patient verbalized understanding and all questions were answered. Patient received an After-Visit Summary.    No orders of the defined types were placed in this encounter.  No orders of the defined types were placed in this encounter.  Referral Orders  No referral(s) requested today      Note is dictated utilizing voice recognition software. Although note has been proof read prior to signing, occasional typographical errors still can be missed. If any questions arise, please do not hesitate to call for verification.    electronically signed by:  Felix Pacini, DO  [facility] [place]

## 2023-12-26 LAB — CULTURE, BLOOD (SINGLE): Culture: NO GROWTH

## 2023-12-29 DIAGNOSIS — I2510 Atherosclerotic heart disease of native coronary artery without angina pectoris: Secondary | ICD-10-CM | POA: Diagnosis not present

## 2023-12-29 DIAGNOSIS — I350 Nonrheumatic aortic (valve) stenosis: Secondary | ICD-10-CM | POA: Diagnosis not present

## 2023-12-29 DIAGNOSIS — Z7984 Long term (current) use of oral hypoglycemic drugs: Secondary | ICD-10-CM | POA: Diagnosis not present

## 2023-12-29 DIAGNOSIS — M549 Dorsalgia, unspecified: Secondary | ICD-10-CM | POA: Diagnosis not present

## 2023-12-29 DIAGNOSIS — I77810 Thoracic aortic ectasia: Secondary | ICD-10-CM | POA: Diagnosis not present

## 2023-12-29 DIAGNOSIS — E1142 Type 2 diabetes mellitus with diabetic polyneuropathy: Secondary | ICD-10-CM | POA: Diagnosis not present

## 2023-12-29 DIAGNOSIS — E785 Hyperlipidemia, unspecified: Secondary | ICD-10-CM | POA: Diagnosis not present

## 2023-12-29 DIAGNOSIS — G8929 Other chronic pain: Secondary | ICD-10-CM | POA: Diagnosis not present

## 2023-12-29 DIAGNOSIS — G4700 Insomnia, unspecified: Secondary | ICD-10-CM | POA: Diagnosis not present

## 2023-12-29 DIAGNOSIS — M47817 Spondylosis without myelopathy or radiculopathy, lumbosacral region: Secondary | ICD-10-CM | POA: Diagnosis not present

## 2023-12-29 DIAGNOSIS — E113299 Type 2 diabetes mellitus with mild nonproliferative diabetic retinopathy without macular edema, unspecified eye: Secondary | ICD-10-CM | POA: Diagnosis not present

## 2023-12-29 DIAGNOSIS — Z7982 Long term (current) use of aspirin: Secondary | ICD-10-CM | POA: Diagnosis not present

## 2023-12-29 DIAGNOSIS — L720 Epidermal cyst: Secondary | ICD-10-CM | POA: Diagnosis not present

## 2023-12-29 DIAGNOSIS — L02212 Cutaneous abscess of back [any part, except buttock]: Secondary | ICD-10-CM | POA: Diagnosis not present

## 2023-12-29 DIAGNOSIS — M51369 Other intervertebral disc degeneration, lumbar region without mention of lumbar back pain or lower extremity pain: Secondary | ICD-10-CM | POA: Diagnosis not present

## 2023-12-29 DIAGNOSIS — I119 Hypertensive heart disease without heart failure: Secondary | ICD-10-CM | POA: Diagnosis not present

## 2023-12-29 DIAGNOSIS — E1169 Type 2 diabetes mellitus with other specified complication: Secondary | ICD-10-CM | POA: Diagnosis not present

## 2023-12-29 DIAGNOSIS — Z794 Long term (current) use of insulin: Secondary | ICD-10-CM | POA: Diagnosis not present

## 2024-01-01 ENCOUNTER — Telehealth: Payer: Self-pay

## 2024-01-01 ENCOUNTER — Ambulatory Visit: Payer: 59 | Admitting: Audiologist

## 2024-01-01 NOTE — Telephone Encounter (Signed)
 Approved.

## 2024-01-01 NOTE — Telephone Encounter (Signed)
 Faxed

## 2024-01-01 NOTE — Telephone Encounter (Signed)
Home health orders received 12/27/23 for [name] [facility] health initiation orders: No.  Home health re-certification orders: Yes. Patient last seen by ordering physician for this condition: 12/25/23. Must be less than 90 days for re-certification and less than 30 days prior for initiation. Visit must have been for the condition the orders are being placed.  Patient meets criteria for Physician to sign orders: Yes.        Current med list has been attached: Yes        Orders placed on physicians desk for signature: 01/01/24 (date) If patient does not meet criteria for orders to be signed: pt was called to schedule appt. Appt is scheduled for n/a.   Filomena Jungling

## 2024-01-02 ENCOUNTER — Ambulatory Visit (INDEPENDENT_AMBULATORY_CARE_PROVIDER_SITE_OTHER): Payer: 59 | Admitting: Surgery

## 2024-01-02 ENCOUNTER — Encounter: Payer: Self-pay | Admitting: Surgery

## 2024-01-02 ENCOUNTER — Telehealth: Payer: Self-pay

## 2024-01-02 VITALS — BP 116/66 | HR 58 | Temp 98.2°F | Resp 16 | Ht 71.0 in | Wt 215.0 lb

## 2024-01-02 DIAGNOSIS — S21201A Unspecified open wound of right back wall of thorax without penetration into thoracic cavity, initial encounter: Secondary | ICD-10-CM

## 2024-01-02 DIAGNOSIS — L02212 Cutaneous abscess of back [any part, except buttock]: Secondary | ICD-10-CM | POA: Diagnosis not present

## 2024-01-02 MED ORDER — SULFAMETHOXAZOLE-TRIMETHOPRIM 800-160 MG PO TABS: 1.0000 | ORAL_TABLET | Freq: Two times a day (BID) | ORAL | 0 refills | Status: AC

## 2024-01-02 NOTE — Telephone Encounter (Signed)
Patient having surgery today but has head cold. He is requesting something called in for him. I told him that it is very unlikely that Dr. Claiborne Billings would call in meds for him.  Patient would like to speak with Erie Noe  Please call [phone]

## 2024-01-02 NOTE — Progress Notes (Signed)
 [facility] History and Physical  Reason for Referral: Back abscess Referring Physician: Dr. Marene Gilliam  Chief Complaint   New Patient (Initial Visit)     [name] is a 68 y.o. male.  HPI: Patient presents for evaluation of a back abscess.  He states that starting around [place], an area on his back became red, swollen, and more tender.  He presented to his primary care doctor, who performed an incision and drainage of the area on January 6.  The area continued to cause him problems, so he was evaluated in the [facility] department.  At that time, they reevaluated the wound, and performed an additional incision and drainage on 1/23.  He was told to perform packing changes, and has a wound nurse who has been coming twice a week to help with dressing changes.  He was placed on Bactrim  for 10 to 12 days but he has stopped this about a week ago.  His past medical history is significant for diabetes on insulin, hyperlipidemia, and multiple heart attacks with the first being in 2004 at which time a stent was placed and an additional heart attack in 2008 when another stent was placed.  He denies use of blood thinning medications.  He has never had any cysts or boils excised previously.  He has had a right wrist carpal tunnel surgery.  He will drink alcohol once a week.  Denies use of tobacco products and illicit drugs.  Past Medical History:  Diagnosis Date   Alcohol addiction (HCC)    Anxiety    Arthritis    Bulging of lumbar intervertebral disc 2017   MRI: L3-4, L4-5 and L5-S1- facet hypertrophy and formainal stenosis   CAD (coronary artery disease)    Coronary artery disease with hx of myocardial infarct w/o hx of CABG 2004   Depression    Dextrocardia    Diabetes (HCC)    Heart attack (HCC) 2004   Stent placement 2008   Hyperlipidemia    Hypertension    Insomnia     Past Surgical History:  Procedure Laterality Date   CORONARY ANGIOPLASTY WITH STENT PLACEMENT  2008     Family History  Problem Relation Age of Onset   Heart attack Mother    Heart disease Mother    Diabetes Mother    Arthritis Mother    Hyperlipidemia Mother    Hypertension Mother    Breast cancer Mother    Heart attack Father    Heart disease Father    Alcohol abuse Father    Early death Father    Arthritis Sister    Heart disease Sister    Hyperlipidemia Sister    Asthma Sister    COPD Sister    Diabetes Sister    Alcohol abuse Sister     Social History   Tobacco Use   Smoking status: Never   Smokeless tobacco: Never  Vaping Use   Vaping status: Never Used  Substance Use Topics   Alcohol use: Yes    Alcohol/week: 0.0 standard drinks of alcohol    Comment: Occasional   Drug use: Not Currently    Types: Cocaine, Marijuana    Comment: Former    Medications: I have reviewed the patient's current medications. Allergies as of 01/02/2024       Reactions   Gabapentin  Other (See Comments)   Paranoia/hallucinations   Hydrocodone  Other (See Comments)   PT reports  Confusion when tacking Hydrocodone    Nsaids  History of heart disease, CAD with history of MI, CABG status post stent   Prednisone    Paranoia   Tramadol     Penicillins Other (See Comments)   Childhood unsure if still allgeric        Medication List        Accurate as of January 02, 2024 11:59 PM. If you have any questions, ask your nurse or doctor.          Accu-Chek FastClix Lancets Misc   aspirin  EC 81 MG tablet Take 1 tablet (81 mg total) by mouth daily.   dapagliflozin  propanediol 10 MG Tabs tablet Commonly known as: Farxiga  TAKE 1 TABLET BY MOUTH  DAILY BEFORE BREAKFAST   diazepam  5 MG tablet Commonly known as: VALIUM  Take 1 tablet (5 mg total) by mouth every 6 (six) hours as needed for anxiety.   Janumet  XR [phone] MG Tb24 Generic drug: SitaGLIPtin -MetFORMIN  HCl Take 1 tablet by mouth daily.   Lantus  SoloStar 100 UNIT/ML Solostar Pen Generic drug: insulin  glargine Inject 70 Units into the skin daily.   lisinopril  20 MG tablet Commonly known as: ZESTRIL  Take 1 tablet (20 mg total) by mouth daily.   mupirocin  ointment 2 % Commonly known as: BACTROBAN  Apply 1 Application topically 2 (two) times daily for 21 days.   naproxen  500 MG tablet Commonly known as: NAPROSYN  TAKE 1 TABLET BY MOUTH EVERY 12 HOURS W/FOOD AS NEEDED ONLY *NEW PRESCRIPTION REQUEST*   nitroGLYCERIN  0.4 MG SL tablet Commonly known as: NITROSTAT  DISSOLVE 1 TABLET UNDER THE TONGUE EVERY 5 MINUTES AS  NEEDED FOR CHEST PAIN. MAX  OF 3 TABLETS IN 15 MINUTES. CALL 911 IF PAIN PERSISTS.   omega-3 acid ethyl esters 1 g capsule Commonly known as: LOVAZA  Take 1 capsule (1 g total) by mouth daily.   OneTouch Verio test strip Generic drug: glucose blood USE AS DIRECTED *NEW PRESCRIPTION REQUEST*   rosuvastatin  40 MG tablet Commonly known as: CRESTOR  Take 1 tablet (40 mg total) by mouth at bedtime.   sulfamethoxazole -trimethoprim  800-160 MG tablet Commonly known as: BACTRIM  DS Take 1 tablet by mouth 2 (two) times daily for 7 days. Started by: [name]   traZODone  100 MG tablet Commonly known as: DESYREL  Take 1 tablet (100 mg total) by mouth at bedtime as needed for sleep.   UltiCare Pen Needles 31G X 5 MM Misc Generic drug: Insulin Pen Needle USE TO INJECT PEN INSULIN *NEW PRESCRIPTION REQUEST*         ROS:  Constitutional: negative for chills, fatigue, and fevers Eyes: positive for visual disturbance, negative for pain Ears, nose, mouth, throat, and face: negative for ear drainage, sore throat, and sinus problems Respiratory: negative for cough, wheezing, and shortness of breath Cardiovascular: negative for chest pain and palpitations Gastrointestinal: negative for abdominal pain, nausea, reflux symptoms, and vomiting Genitourinary:negative for dysuria and frequency Integument/breast: positive for boil, negative for dryness and  rash Hematologic/lymphatic: negative for bleeding and lymphadenopathy Musculoskeletal:positive for back pain, negative for neck pain Neurological: negative for dizziness and tremors Endocrine: negative for temperature intolerance  Blood pressure 116/66, pulse (!) 58, temperature 98.2 F (36.8 C), temperature source Oral, resp. rate 16, height 5' 11 (1.803 m), weight 215 lb (97.5 kg), SpO2 93%. Physical Exam Vitals reviewed.  Constitutional:      Appearance: Normal appearance.  HENT:     Head: Normocephalic and atraumatic.  Eyes:     Extraocular Movements: Extraocular movements intact.     Pupils: Pupils are equal,  round, and reactive to light.  Cardiovascular:     Rate and Rhythm: Normal rate and regular rhythm.  Pulmonary:     Effort: Pulmonary effort is normal.     Breath sounds: Normal breath sounds.  Abdominal:     General: There is no distension.     Palpations: Abdomen is soft.     Tenderness: There is no abdominal tenderness.  Musculoskeletal:        General: Normal range of motion.     Cervical back: Normal range of motion.  Skin:    General: Skin is warm and dry.     Comments: Mid back with 4 cm area of induration and erythema with a central wound from previous incision and drainage that tracks at the 10 to 11 o'clock position, no purulent drainage noted, nontender to palpation  Neurological:     General: No focal deficit present.     Mental Status: He is alert and oriented to person, place, and time.  Psychiatric:        Mood and Affect: Mood normal.        Behavior: Behavior normal.     Results: No results found for this or any previous visit (from the past 48 hours).  No results found.   Assessment & Plan:  [name] is a 68 y.o. male who presents for evaluation of a back abscess.  -I discussed with the patient that this area no longer requires incision and drainage, as it is open and able to drain.  I do believe that he needs an additional week of  antibiotics given the significant erythema and induration surrounding the area. -7-day prescription of Bactrim  provided to the patient -We further discussed that I cannot provide any surgical excision of the area at the time given the amount of inflammation in the surrounding tissue -Advised that he should be packing the wound daily with iodoform packing.  Wound care instructions given to the patient.  He states that his sister should be able to help him with dressing changes over the weekend.  He also has scheduled home health care coming twice a week.  Advised that he should try to see if they can come 3 times a week if not more frequently.  Advised he can come to our office if he needs assistance with his dressing changes, or he should reach out to his primary care doctor's office to see if the nurses would be able to help him with dressing changes -Follow up with me in 2 weeks  All questions were answered to the satisfaction of the patient.  Dorothyann Brittle, DO [facility] [address] [place], [place] [address] [phone] (office)

## 2024-01-02 NOTE — Telephone Encounter (Signed)
 Spoke with patient regarding results/recommendations.

## 2024-01-02 NOTE — Patient Instructions (Addendum)
 Wound Care Instructions:  -Change dressing daily if possible -May shower and let the water run over top of the wound.  Pat dry afterwards -Pack wound iodoform gauze.  The wound tracks up at the 10-11 o'clock position. Use a q-tip to pack the gauze in the wound -Cover with dry gauze and medipore tape -May come to [facility] during the week or may call your PCP office for a nurse visit to assist with dressing changes

## 2024-01-03 DIAGNOSIS — I77810 Thoracic aortic ectasia: Secondary | ICD-10-CM | POA: Diagnosis not present

## 2024-01-03 DIAGNOSIS — M47817 Spondylosis without myelopathy or radiculopathy, lumbosacral region: Secondary | ICD-10-CM | POA: Diagnosis not present

## 2024-01-03 DIAGNOSIS — M51369 Other intervertebral disc degeneration, lumbar region without mention of lumbar back pain or lower extremity pain: Secondary | ICD-10-CM | POA: Diagnosis not present

## 2024-01-03 DIAGNOSIS — G8929 Other chronic pain: Secondary | ICD-10-CM | POA: Diagnosis not present

## 2024-01-03 DIAGNOSIS — E113299 Type 2 diabetes mellitus with mild nonproliferative diabetic retinopathy without macular edema, unspecified eye: Secondary | ICD-10-CM | POA: Diagnosis not present

## 2024-01-03 DIAGNOSIS — E1169 Type 2 diabetes mellitus with other specified complication: Secondary | ICD-10-CM | POA: Diagnosis not present

## 2024-01-03 DIAGNOSIS — I350 Nonrheumatic aortic (valve) stenosis: Secondary | ICD-10-CM | POA: Diagnosis not present

## 2024-01-03 DIAGNOSIS — I2510 Atherosclerotic heart disease of native coronary artery without angina pectoris: Secondary | ICD-10-CM | POA: Diagnosis not present

## 2024-01-03 DIAGNOSIS — Z7984 Long term (current) use of oral hypoglycemic drugs: Secondary | ICD-10-CM | POA: Diagnosis not present

## 2024-01-03 DIAGNOSIS — E1142 Type 2 diabetes mellitus with diabetic polyneuropathy: Secondary | ICD-10-CM | POA: Diagnosis not present

## 2024-01-03 DIAGNOSIS — I119 Hypertensive heart disease without heart failure: Secondary | ICD-10-CM | POA: Diagnosis not present

## 2024-01-03 DIAGNOSIS — L02212 Cutaneous abscess of back [any part, except buttock]: Secondary | ICD-10-CM | POA: Diagnosis not present

## 2024-01-03 DIAGNOSIS — Z7982 Long term (current) use of aspirin: Secondary | ICD-10-CM | POA: Diagnosis not present

## 2024-01-03 DIAGNOSIS — G4700 Insomnia, unspecified: Secondary | ICD-10-CM | POA: Diagnosis not present

## 2024-01-03 DIAGNOSIS — L720 Epidermal cyst: Secondary | ICD-10-CM | POA: Diagnosis not present

## 2024-01-03 DIAGNOSIS — Z794 Long term (current) use of insulin: Secondary | ICD-10-CM | POA: Diagnosis not present

## 2024-01-03 DIAGNOSIS — E785 Hyperlipidemia, unspecified: Secondary | ICD-10-CM | POA: Diagnosis not present

## 2024-01-03 DIAGNOSIS — M549 Dorsalgia, unspecified: Secondary | ICD-10-CM | POA: Diagnosis not present

## 2024-01-04 ENCOUNTER — Telehealth: Payer: Self-pay | Admitting: Family Medicine

## 2024-01-04 DIAGNOSIS — G8929 Other chronic pain: Secondary | ICD-10-CM

## 2024-01-04 DIAGNOSIS — M549 Dorsalgia, unspecified: Secondary | ICD-10-CM

## 2024-01-04 DIAGNOSIS — E1169 Type 2 diabetes mellitus with other specified complication: Secondary | ICD-10-CM | POA: Diagnosis not present

## 2024-01-04 DIAGNOSIS — I77810 Thoracic aortic ectasia: Secondary | ICD-10-CM

## 2024-01-04 DIAGNOSIS — E1142 Type 2 diabetes mellitus with diabetic polyneuropathy: Secondary | ICD-10-CM | POA: Diagnosis not present

## 2024-01-04 DIAGNOSIS — Z794 Long term (current) use of insulin: Secondary | ICD-10-CM

## 2024-01-04 DIAGNOSIS — L02212 Cutaneous abscess of back [any part, except buttock]: Secondary | ICD-10-CM | POA: Diagnosis not present

## 2024-01-04 DIAGNOSIS — E785 Hyperlipidemia, unspecified: Secondary | ICD-10-CM | POA: Diagnosis not present

## 2024-01-04 DIAGNOSIS — E113299 Type 2 diabetes mellitus with mild nonproliferative diabetic retinopathy without macular edema, unspecified eye: Secondary | ICD-10-CM

## 2024-01-04 DIAGNOSIS — F411 Generalized anxiety disorder: Secondary | ICD-10-CM

## 2024-01-04 DIAGNOSIS — L720 Epidermal cyst: Secondary | ICD-10-CM

## 2024-01-04 DIAGNOSIS — E118 Type 2 diabetes mellitus with unspecified complications: Secondary | ICD-10-CM | POA: Diagnosis not present

## 2024-01-04 DIAGNOSIS — F1020 Alcohol dependence, uncomplicated: Secondary | ICD-10-CM

## 2024-01-04 NOTE — Telephone Encounter (Signed)
 Home health orders received 01/04/2024 for [name] [facility] health initiation orders: Yes Home health re-certification orders: No. Patient last seen by ordering physician for this condition: 12/25/2023. Must be less than 90 days for re-certification and less than 30 days prior for initiation. Visit must have been for the condition the orders are being placed.  Patient meets criteria for Physician to sign orders: Yes.        Current med list has been attached: Yes        Orders placed on physicians desk for signature: 01/04/2024 (date) If patient does not meet criteria for orders to be signed: pt was called to schedule appt. Appt is scheduled for today.    Completed and returned to CMA work the [employer]

## 2024-01-04 NOTE — Telephone Encounter (Signed)
 Forms faxed

## 2024-01-05 DIAGNOSIS — E113299 Type 2 diabetes mellitus with mild nonproliferative diabetic retinopathy without macular edema, unspecified eye: Secondary | ICD-10-CM | POA: Diagnosis not present

## 2024-01-05 DIAGNOSIS — M51369 Other intervertebral disc degeneration, lumbar region without mention of lumbar back pain or lower extremity pain: Secondary | ICD-10-CM | POA: Diagnosis not present

## 2024-01-05 DIAGNOSIS — Z7982 Long term (current) use of aspirin: Secondary | ICD-10-CM | POA: Diagnosis not present

## 2024-01-05 DIAGNOSIS — I77810 Thoracic aortic ectasia: Secondary | ICD-10-CM | POA: Diagnosis not present

## 2024-01-05 DIAGNOSIS — Z794 Long term (current) use of insulin: Secondary | ICD-10-CM | POA: Diagnosis not present

## 2024-01-05 DIAGNOSIS — L02212 Cutaneous abscess of back [any part, except buttock]: Secondary | ICD-10-CM | POA: Diagnosis not present

## 2024-01-05 DIAGNOSIS — Z7984 Long term (current) use of oral hypoglycemic drugs: Secondary | ICD-10-CM | POA: Diagnosis not present

## 2024-01-05 DIAGNOSIS — G4700 Insomnia, unspecified: Secondary | ICD-10-CM | POA: Diagnosis not present

## 2024-01-05 DIAGNOSIS — M549 Dorsalgia, unspecified: Secondary | ICD-10-CM | POA: Diagnosis not present

## 2024-01-05 DIAGNOSIS — E1142 Type 2 diabetes mellitus with diabetic polyneuropathy: Secondary | ICD-10-CM | POA: Diagnosis not present

## 2024-01-05 DIAGNOSIS — E1169 Type 2 diabetes mellitus with other specified complication: Secondary | ICD-10-CM | POA: Diagnosis not present

## 2024-01-05 DIAGNOSIS — L720 Epidermal cyst: Secondary | ICD-10-CM | POA: Diagnosis not present

## 2024-01-05 DIAGNOSIS — E785 Hyperlipidemia, unspecified: Secondary | ICD-10-CM | POA: Diagnosis not present

## 2024-01-05 DIAGNOSIS — G8929 Other chronic pain: Secondary | ICD-10-CM | POA: Diagnosis not present

## 2024-01-05 DIAGNOSIS — M47817 Spondylosis without myelopathy or radiculopathy, lumbosacral region: Secondary | ICD-10-CM | POA: Diagnosis not present

## 2024-01-05 DIAGNOSIS — I2510 Atherosclerotic heart disease of native coronary artery without angina pectoris: Secondary | ICD-10-CM | POA: Diagnosis not present

## 2024-01-05 DIAGNOSIS — I119 Hypertensive heart disease without heart failure: Secondary | ICD-10-CM | POA: Diagnosis not present

## 2024-01-05 DIAGNOSIS — I350 Nonrheumatic aortic (valve) stenosis: Secondary | ICD-10-CM | POA: Diagnosis not present

## 2024-01-08 ENCOUNTER — Encounter: Payer: Self-pay | Admitting: Family Medicine

## 2024-01-08 ENCOUNTER — Ambulatory Visit: Payer: 59 | Admitting: Family Medicine

## 2024-01-08 VITALS — BP 118/70 | HR 62 | Temp 98.6°F | Wt 215.8 lb

## 2024-01-08 DIAGNOSIS — E785 Hyperlipidemia, unspecified: Secondary | ICD-10-CM

## 2024-01-08 DIAGNOSIS — Z794 Long term (current) use of insulin: Secondary | ICD-10-CM

## 2024-01-08 DIAGNOSIS — L84 Corns and callosities: Secondary | ICD-10-CM

## 2024-01-08 DIAGNOSIS — Z7984 Long term (current) use of oral hypoglycemic drugs: Secondary | ICD-10-CM | POA: Diagnosis not present

## 2024-01-08 DIAGNOSIS — Z5189 Encounter for other specified aftercare: Secondary | ICD-10-CM

## 2024-01-08 DIAGNOSIS — R0981 Nasal congestion: Secondary | ICD-10-CM

## 2024-01-08 DIAGNOSIS — E1169 Type 2 diabetes mellitus with other specified complication: Secondary | ICD-10-CM

## 2024-01-08 NOTE — Patient Instructions (Signed)

## 2024-01-08 NOTE — Progress Notes (Signed)
 All      [name] , October 15, 1956, 68 y.o., male MRN: [id number] Patient Care Team    Relationship Specialty Notifications Start End  Mariel Shope, DO PCP - General Family Medicine  11/29/18   Ronelle Coffee, MD Consulting Physician Ophthalmology  01/09/19   Laurann Pollock, MD Consulting Physician Cardiology  07/19/23   Remona Carmel, RN [facility] Care Management General Practice  11/14/23    Comment: [phone]    Chief Complaint  Patient presents with   Cyst    F/U.      Subjective: [name] is a 68 y.o. Pt presents for an OV to follow-up on multiple epidermal cysts right upper back.  Since last visit patient has seen surgeon, they did not feel surgery was indicated.  Since that time he has had a home health nurse come out and his sister's assist with changing the dressings.  Surgeon elected to extend antibiotic and patient is compliant with medication regimen.  Prior note:   Since last visit patient has been urged to go to the emergency room as there was not more we could offer here in patient office and surgery could not get him in to be seen soon, as well as home health nurse that get out to see him for follow-up.  In the ED I&D was completed again, they were able to evaluate cyst more thoroughly and irrigate.  Today patient reports the nurse has not been out again, but they are scheduled in 2 days.  He has continued the bactrim .  Denies fever  Prior note Patient was seen on Monday and Wednesday of this week.  Initial I&D completed on Monday, repeat I&D required on Wednesday.  Urine culture shows no growth.  Patient is taking Bactrim  as prescribed.  There are multiple epidermal cysts in this location that have been draining. Today he states he is unable to change the dressing himself and does not have any assistance in the house currently.  His sister has not been able to make it over to help him.  He denies any fevers or chills.  Wound continues to drain.  He is  finishing the antibiotics prescribed.  Wound culture negative.  Component Ref Range & Units (hover) 4 d ago  MICRO NUMBER: [id number]  SPECIMEN QUALITY: Adequate  SOURCE: WOUND (SITE NOT SPECIFIED)  STATUS: FINAL  GRAM STAIN: Many White blood cells seen Few epithelial cells No organisms seen  RESULT: No Growth  Resulting Agency [facility]     Prior note: Patient was seen 2 days ago with complaints of back cyst of 12 days duration.  Associated symptoms included blood tinge drainage and green drainage.  He states he has had cysts on his back before in a similar location that required I&D.  Patient denies fevers or chills.  Patient underwent I&D in the office of his abscess.  Patient reports that has been draining over the last 2 days.  He denies any fevers.  He states he did start the Bactrim  DS twice daily.   Of note he also complains of left large toe callus and upper respiratory symptoms that have been present for a week.     08/21/2023    1:04 PM 03/24/2023    1:35 PM 12/09/2022    1:43 PM 12/05/2022    9:54 AM 08/19/2022   12:57 PM  Depression screen PHQ 2/9  Decreased Interest 0 0 2 1 0  Down, Depressed, Hopeless 0 0 0 0  0  PHQ - 2 Score 0 0 2 1 0  Altered sleeping   0    Tired, decreased energy   2    Change in appetite   3    Feeling bad or failure about yourself    0    Trouble concentrating   2    Moving slowly or fidgety/restless   0    Suicidal thoughts   0    PHQ-9 Score   9      Allergies  Allergen Reactions   Gabapentin  Other (See Comments)    Paranoia/hallucinations   Hydrocodone  Other (See Comments)    PT reports  Confusion when tacking Hydrocodone    Nsaids     History of heart disease, CAD with history of MI, CABG status post stent   Prednisone     Paranoia   Tramadol     Penicillins Other (See Comments)    Childhood unsure if still allgeric   Social History   Social History Narrative   Marital status/children/pets: Single    Education/employment: HS grad, 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:      -smoke alarm in the home:Yes     - wears seatbelt: Yes      Past Medical History:  Diagnosis Date   Alcohol addiction (HCC)    Anxiety    Arthritis    Bulging of lumbar intervertebral disc 2017   MRI: L3-4, L4-5 and L5-S1- facet hypertrophy and formainal stenosis   CAD (coronary artery disease)    Coronary artery disease with hx of myocardial infarct w/o hx of CABG 2004   Depression    Dextrocardia    Diabetes (HCC)    Heart attack (HCC) 2004   Stent placement 2008   Hyperlipidemia    Hypertension    Insomnia    Past Surgical History:  Procedure Laterality Date   CORONARY ANGIOPLASTY WITH STENT PLACEMENT  2008   Family History  Problem Relation Age of Onset   Heart attack Mother    Heart disease Mother    Diabetes Mother    Arthritis Mother    Hyperlipidemia Mother    Hypertension Mother    Breast cancer Mother    Heart attack Father    Heart disease Father    Alcohol abuse Father    Early death Father    Arthritis Sister    Heart disease Sister    Hyperlipidemia Sister    Asthma Sister    COPD Sister    Diabetes Sister    Alcohol abuse Sister    Allergies as of 01/08/2024       Reactions   Gabapentin  Other (See Comments)   Paranoia/hallucinations   Hydrocodone  Other (See Comments)   PT reports  Confusion when tacking Hydrocodone    Nsaids    History of heart disease, CAD with history of MI, CABG status post stent   Prednisone    Paranoia   Tramadol     Penicillins Other (See Comments)   Childhood unsure if still allgeric        Medication List        Accurate as of January 08, 2024 11:40 AM. If you have any questions, ask your nurse or doctor.          Accu-Chek FastClix Lancets Misc   aspirin  EC 81 MG tablet Take 1 tablet (81 mg total) by mouth daily.   dapagliflozin  propanediol 10 MG Tabs tablet Commonly known as: Farxiga  TAKE 1 TABLET BY MOUTH  DAILY BEFORE  BREAKFAST   diazepam  5 MG tablet Commonly known as: VALIUM  Take 1 tablet (5 mg total) by mouth every 6 (six) hours as needed for anxiety.   Janumet  XR [phone] MG Tb24 Generic drug: SitaGLIPtin -MetFORMIN  HCl Take 1 tablet by mouth daily.   Lantus  SoloStar 100 UNIT/ML Solostar Pen Generic drug: insulin glargine  Inject 70 Units into the skin daily.   lisinopril  20 MG tablet Commonly known as: ZESTRIL  Take 1 tablet (20 mg total) by mouth daily.   naproxen  500 MG tablet Commonly known as: NAPROSYN  TAKE 1 TABLET BY MOUTH EVERY 12 HOURS W/FOOD AS NEEDED ONLY *NEW PRESCRIPTION REQUEST*   nitroGLYCERIN  0.4 MG SL tablet Commonly known as: NITROSTAT  DISSOLVE 1 TABLET UNDER THE TONGUE EVERY 5 MINUTES AS  NEEDED FOR CHEST PAIN. MAX  OF 3 TABLETS IN 15 MINUTES. CALL 911 IF PAIN PERSISTS.   omega-3 acid ethyl esters 1 g capsule Commonly known as: LOVAZA  Take 1 capsule (1 g total) by mouth daily.   OneTouch Verio test strip Generic drug: glucose blood USE AS DIRECTED *NEW PRESCRIPTION REQUEST*   rosuvastatin  40 MG tablet Commonly known as: CRESTOR  Take 1 tablet (40 mg total) by mouth at bedtime.   sulfamethoxazole -trimethoprim  800-160 MG tablet Commonly known as: BACTRIM  DS Take 1 tablet by mouth 2 (two) times daily for 7 days.   traZODone  100 MG tablet Commonly known as: DESYREL  Take 1 tablet (100 mg total) by mouth at bedtime as needed for sleep.   UltiCare Pen Needles 31G X 5 MM Misc Generic drug: Insulin Pen Needle USE TO INJECT PEN INSULIN *NEW PRESCRIPTION REQUEST*        All past medical history, surgical history, allergies, family history, immunizations andmedications were updated in the EMR today and reviewed under the history and medication portions of their EMR.     ROS Negative, with the exception of above mentioned in HPI   Objective:  BP 118/70   Pulse 62   Temp 98.6 F (37 C)   Wt 215 lb 12.8 oz (97.9 kg)   SpO2 99%   BMI 30.10 kg/m  Body mass  index is 30.1 kg/m. Physical Exam Vitals and nursing note reviewed.  Constitutional:      General: He is not in acute distress.    Appearance: Normal appearance. He is not ill-appearing, toxic-appearing or diaphoretic.  HENT:     Head: Normocephalic and atraumatic.     Right Ear: Tympanic membrane normal.     Left Ear: Tympanic membrane normal.     Nose: Congestion and rhinorrhea present.     Mouth/Throat:     Mouth: Mucous membranes are moist.     Pharynx: No oropharyngeal exudate or posterior oropharyngeal erythema.  Eyes:     General: No scleral icterus.       Right eye: No discharge.        Left eye: No discharge.     Extraocular Movements: Extraocular movements intact.     Pupils: Pupils are equal, round, and reactive to light.  Pulmonary:     Effort: Pulmonary effort is normal. No respiratory distress.     Breath sounds: No wheezing, rhonchi or rales.     Comments: Mild cough present Musculoskeletal:     Comments: Left large toe: Callus medial aspect of toe present.  Back: Approximately 2.5/3 cm round, flat area of mild erythema present.  Packing in place.  No drainage, no bleeding.  Skin:    General: Skin is warm and dry.  Coloration: Skin is not jaundiced or pale.     Findings: No rash.  Neurological:     Mental Status: He is alert and oriented to person, place, and time. Mental status is at baseline.  Psychiatric:        Mood and Affect: Mood normal.        Behavior: Behavior normal.        Thought Content: Thought content normal.        Judgment: Judgment normal.     No results found. No results found. No results found for this or any previous visit (from the past 24 hours).  Assessment/Plan: JARIUS ROYE is a 68 y.o. male present for OV for  Epidermal cyst Area continues to improve, does not appear infected today.  No drainage or bleeding present. Packing removed and replaced with fresh packing and dressing. Patient has his sister to help with  wound/dressing change tomorrow, home health nurse comes out Wednesday and Friday this week.  He then has a follow-up with surgeon on the following Tuesday.  Follow-up as needed  Callus: Rather significant callus medial aspect of left great toe in a diabetic. Referral back to podiatry placed.  Upper respiratory infection: Discussed his respiratory infection was likely a virus. Vitals are stable and his lung exam is normal today. He is on an antibiotic for his wound, and it would cover sinus infection if necessary.  Patient reports understanding.   Discussed self-management of symptoms. Outlined signs and symptoms indicating need for more acute intervention. Patient verbalized understanding and all questions were answered. Patient received an After-Visit Summary.    Orders Placed This Encounter  Procedures   Ambulatory referral to Podiatry   No orders of the defined types were placed in this encounter.  Referral Orders         Ambulatory referral to Podiatry        Note is dictated utilizing voice recognition software. Although note has been proof read prior to signing, occasional typographical errors still can be missed. If any questions arise, please do not hesitate to call for verification.   electronically signed by:  Napolean Backbone, DO  [facility] - [place]

## 2024-01-10 DIAGNOSIS — L02212 Cutaneous abscess of back [any part, except buttock]: Secondary | ICD-10-CM | POA: Diagnosis not present

## 2024-01-10 DIAGNOSIS — E1169 Type 2 diabetes mellitus with other specified complication: Secondary | ICD-10-CM | POA: Diagnosis not present

## 2024-01-10 DIAGNOSIS — Z794 Long term (current) use of insulin: Secondary | ICD-10-CM | POA: Diagnosis not present

## 2024-01-10 DIAGNOSIS — G8929 Other chronic pain: Secondary | ICD-10-CM | POA: Diagnosis not present

## 2024-01-10 DIAGNOSIS — Z7984 Long term (current) use of oral hypoglycemic drugs: Secondary | ICD-10-CM | POA: Diagnosis not present

## 2024-01-10 DIAGNOSIS — M47817 Spondylosis without myelopathy or radiculopathy, lumbosacral region: Secondary | ICD-10-CM | POA: Diagnosis not present

## 2024-01-10 DIAGNOSIS — Z7982 Long term (current) use of aspirin: Secondary | ICD-10-CM | POA: Diagnosis not present

## 2024-01-10 DIAGNOSIS — I2510 Atherosclerotic heart disease of native coronary artery without angina pectoris: Secondary | ICD-10-CM | POA: Diagnosis not present

## 2024-01-10 DIAGNOSIS — I77810 Thoracic aortic ectasia: Secondary | ICD-10-CM | POA: Diagnosis not present

## 2024-01-10 DIAGNOSIS — M51369 Other intervertebral disc degeneration, lumbar region without mention of lumbar back pain or lower extremity pain: Secondary | ICD-10-CM | POA: Diagnosis not present

## 2024-01-10 DIAGNOSIS — M549 Dorsalgia, unspecified: Secondary | ICD-10-CM | POA: Diagnosis not present

## 2024-01-10 DIAGNOSIS — I350 Nonrheumatic aortic (valve) stenosis: Secondary | ICD-10-CM | POA: Diagnosis not present

## 2024-01-10 DIAGNOSIS — E113299 Type 2 diabetes mellitus with mild nonproliferative diabetic retinopathy without macular edema, unspecified eye: Secondary | ICD-10-CM | POA: Diagnosis not present

## 2024-01-10 DIAGNOSIS — G4700 Insomnia, unspecified: Secondary | ICD-10-CM | POA: Diagnosis not present

## 2024-01-10 DIAGNOSIS — L720 Epidermal cyst: Secondary | ICD-10-CM | POA: Diagnosis not present

## 2024-01-10 DIAGNOSIS — E785 Hyperlipidemia, unspecified: Secondary | ICD-10-CM | POA: Diagnosis not present

## 2024-01-10 DIAGNOSIS — I119 Hypertensive heart disease without heart failure: Secondary | ICD-10-CM | POA: Diagnosis not present

## 2024-01-10 DIAGNOSIS — E1142 Type 2 diabetes mellitus with diabetic polyneuropathy: Secondary | ICD-10-CM | POA: Diagnosis not present

## 2024-01-12 DIAGNOSIS — I350 Nonrheumatic aortic (valve) stenosis: Secondary | ICD-10-CM | POA: Diagnosis not present

## 2024-01-12 DIAGNOSIS — M51369 Other intervertebral disc degeneration, lumbar region without mention of lumbar back pain or lower extremity pain: Secondary | ICD-10-CM | POA: Diagnosis not present

## 2024-01-12 DIAGNOSIS — E1142 Type 2 diabetes mellitus with diabetic polyneuropathy: Secondary | ICD-10-CM | POA: Diagnosis not present

## 2024-01-12 DIAGNOSIS — I2510 Atherosclerotic heart disease of native coronary artery without angina pectoris: Secondary | ICD-10-CM | POA: Diagnosis not present

## 2024-01-12 DIAGNOSIS — Z794 Long term (current) use of insulin: Secondary | ICD-10-CM | POA: Diagnosis not present

## 2024-01-12 DIAGNOSIS — G8929 Other chronic pain: Secondary | ICD-10-CM | POA: Diagnosis not present

## 2024-01-12 DIAGNOSIS — Z7984 Long term (current) use of oral hypoglycemic drugs: Secondary | ICD-10-CM | POA: Diagnosis not present

## 2024-01-12 DIAGNOSIS — M47817 Spondylosis without myelopathy or radiculopathy, lumbosacral region: Secondary | ICD-10-CM | POA: Diagnosis not present

## 2024-01-12 DIAGNOSIS — Z7982 Long term (current) use of aspirin: Secondary | ICD-10-CM | POA: Diagnosis not present

## 2024-01-12 DIAGNOSIS — L720 Epidermal cyst: Secondary | ICD-10-CM | POA: Diagnosis not present

## 2024-01-12 DIAGNOSIS — G4700 Insomnia, unspecified: Secondary | ICD-10-CM | POA: Diagnosis not present

## 2024-01-12 DIAGNOSIS — E113299 Type 2 diabetes mellitus with mild nonproliferative diabetic retinopathy without macular edema, unspecified eye: Secondary | ICD-10-CM | POA: Diagnosis not present

## 2024-01-12 DIAGNOSIS — E1169 Type 2 diabetes mellitus with other specified complication: Secondary | ICD-10-CM | POA: Diagnosis not present

## 2024-01-12 DIAGNOSIS — M549 Dorsalgia, unspecified: Secondary | ICD-10-CM | POA: Diagnosis not present

## 2024-01-12 DIAGNOSIS — I119 Hypertensive heart disease without heart failure: Secondary | ICD-10-CM | POA: Diagnosis not present

## 2024-01-12 DIAGNOSIS — I77810 Thoracic aortic ectasia: Secondary | ICD-10-CM | POA: Diagnosis not present

## 2024-01-12 DIAGNOSIS — L02212 Cutaneous abscess of back [any part, except buttock]: Secondary | ICD-10-CM | POA: Diagnosis not present

## 2024-01-12 DIAGNOSIS — E785 Hyperlipidemia, unspecified: Secondary | ICD-10-CM | POA: Diagnosis not present

## 2024-01-16 ENCOUNTER — Ambulatory Visit (INDEPENDENT_AMBULATORY_CARE_PROVIDER_SITE_OTHER): Payer: 59 | Admitting: Surgery

## 2024-01-16 ENCOUNTER — Encounter: Payer: Self-pay | Admitting: Surgery

## 2024-01-16 VITALS — BP 130/76 | HR 82 | Temp 97.5°F | Resp 14 | Ht 71.0 in | Wt 213.0 lb

## 2024-01-16 DIAGNOSIS — S21201D Unspecified open wound of right back wall of thorax without penetration into thoracic cavity, subsequent encounter: Secondary | ICD-10-CM

## 2024-01-16 NOTE — Progress Notes (Signed)
[facility] Note   HPI:  68 y.o. Male presents to clinic for follow-up for his back wound status post incision and drainage of an infected epidermoid cyst of the back by his PCP and ED provider.  He has been doing well related to his back wound.  He denies any significant drainage, pain, fever, and chills.  He has completed his course of antibiotics.  The area was seen by his primary care doctor last week who changed the dressing for him.  He has a wound care nurse coming twice a week and his sister has been assisting with dressing changes on the weekends.  Review of Systems:  All other review of systems: otherwise negative   Vital Signs:  BP 130/76   Pulse 82   Temp (!) 97.5 F (36.4 C) (Oral)   Resp 14   Ht 5\' 11"  (1.803 m)   Wt 213 lb (96.6 kg)   SpO2 91%   BMI 29.71 kg/m    Physical Exam:  Physical Exam Vitals reviewed.  Constitutional:      Appearance: Normal appearance.  Skin:    General: Skin is warm and dry.     Comments: Mid back wound with improving erythema and induration, cavity feels slightly smaller, wound is approximately 1.5 to 2 cm in size and tracks at the 10 to 11 o'clock position; nontender to palpation, no drainage noted  Neurological:     Mental Status: He is alert.     Laboratory studies: None   Imaging:  None   Assessment:  68 y.o. yo Male presents of a back wound s/p I&D by PCP and ED.  Plan:  -Advised that the area is improving from his last visit with me.   -Recommend continuing with packing changes.  Again we discussed that daily packing changes would be ideal, however as long as he can get the packing changed at least 3-4 times a week, this area will hopefully heal more quickly -Discussed that as the area heals, the amount of packing required will be less and less.  Once the wound is small enough that he is no longer able to pack it, advised that he should just keep the area covered until it scabs over -Advised that he  should call the office if the area starts to become more tender, have purulent drainage, worsening surrounding erythema, fever, or chills -Follow up with me in 1 month  All of the above recommendations were discussed with the patient and all of patient's questions were answered to his expressed satisfaction.  Theophilus Kinds, DO [facility] [address] [place], [place] [address] [phone] (office)

## 2024-01-17 ENCOUNTER — Ambulatory Visit: Payer: 59 | Admitting: Surgery

## 2024-01-19 ENCOUNTER — Ambulatory Visit: Payer: Self-pay | Admitting: Family Medicine

## 2024-01-19 ENCOUNTER — Telehealth: Payer: Self-pay

## 2024-01-19 DIAGNOSIS — I119 Hypertensive heart disease without heart failure: Secondary | ICD-10-CM | POA: Diagnosis not present

## 2024-01-19 DIAGNOSIS — G4700 Insomnia, unspecified: Secondary | ICD-10-CM | POA: Diagnosis not present

## 2024-01-19 DIAGNOSIS — Z7982 Long term (current) use of aspirin: Secondary | ICD-10-CM | POA: Diagnosis not present

## 2024-01-19 DIAGNOSIS — L720 Epidermal cyst: Secondary | ICD-10-CM | POA: Diagnosis not present

## 2024-01-19 DIAGNOSIS — I2510 Atherosclerotic heart disease of native coronary artery without angina pectoris: Secondary | ICD-10-CM | POA: Diagnosis not present

## 2024-01-19 DIAGNOSIS — I77810 Thoracic aortic ectasia: Secondary | ICD-10-CM | POA: Diagnosis not present

## 2024-01-19 DIAGNOSIS — M51369 Other intervertebral disc degeneration, lumbar region without mention of lumbar back pain or lower extremity pain: Secondary | ICD-10-CM | POA: Diagnosis not present

## 2024-01-19 DIAGNOSIS — I350 Nonrheumatic aortic (valve) stenosis: Secondary | ICD-10-CM | POA: Diagnosis not present

## 2024-01-19 DIAGNOSIS — M549 Dorsalgia, unspecified: Secondary | ICD-10-CM | POA: Diagnosis not present

## 2024-01-19 DIAGNOSIS — E1169 Type 2 diabetes mellitus with other specified complication: Secondary | ICD-10-CM | POA: Diagnosis not present

## 2024-01-19 DIAGNOSIS — M47817 Spondylosis without myelopathy or radiculopathy, lumbosacral region: Secondary | ICD-10-CM | POA: Diagnosis not present

## 2024-01-19 DIAGNOSIS — E1142 Type 2 diabetes mellitus with diabetic polyneuropathy: Secondary | ICD-10-CM | POA: Diagnosis not present

## 2024-01-19 DIAGNOSIS — E113299 Type 2 diabetes mellitus with mild nonproliferative diabetic retinopathy without macular edema, unspecified eye: Secondary | ICD-10-CM | POA: Diagnosis not present

## 2024-01-19 DIAGNOSIS — G8929 Other chronic pain: Secondary | ICD-10-CM | POA: Diagnosis not present

## 2024-01-19 DIAGNOSIS — Z7984 Long term (current) use of oral hypoglycemic drugs: Secondary | ICD-10-CM | POA: Diagnosis not present

## 2024-01-19 DIAGNOSIS — E785 Hyperlipidemia, unspecified: Secondary | ICD-10-CM | POA: Diagnosis not present

## 2024-01-19 DIAGNOSIS — Z794 Long term (current) use of insulin: Secondary | ICD-10-CM | POA: Diagnosis not present

## 2024-01-19 DIAGNOSIS — L02212 Cutaneous abscess of back [any part, except buttock]: Secondary | ICD-10-CM | POA: Diagnosis not present

## 2024-01-19 NOTE — Telephone Encounter (Signed)
Patient returned my call. He would like to speak to [place].  Erie Noe, please call patient. [phone]

## 2024-01-19 NOTE — Telephone Encounter (Signed)
Call was disconnected before transfer. Unable to triage. LVM to call back.  Copied from CRM [id number]. Topic: Clinical - Red Word Triage >> Jan 19, 2024 10:14 AM Florestine Avers wrote: [facility] that prompted transfer to Nurse Triage: Patient called in stating that he is spitting up blood. Wanted a same day appointment with Dr. Claiborne Billings and does not want to see any other provider.

## 2024-01-19 NOTE — Telephone Encounter (Signed)
Reason for CRM: Patient requesting a call back from "[place].  I left patient voicemail at 3:40pm 01/19/24 that I was returning his call.  E2C2 Agent:  Please transfer to office when patient calls back. Thank you.

## 2024-01-19 NOTE — Telephone Encounter (Signed)
This RN second attempt to contact patient for triage. No answer, voicemail left requesting return call to clinic.

## 2024-01-22 ENCOUNTER — Other Ambulatory Visit: Payer: Self-pay | Admitting: *Deleted

## 2024-01-22 DIAGNOSIS — I2510 Atherosclerotic heart disease of native coronary artery without angina pectoris: Secondary | ICD-10-CM | POA: Diagnosis not present

## 2024-01-22 DIAGNOSIS — G4700 Insomnia, unspecified: Secondary | ICD-10-CM | POA: Diagnosis not present

## 2024-01-22 DIAGNOSIS — E1169 Type 2 diabetes mellitus with other specified complication: Secondary | ICD-10-CM | POA: Diagnosis not present

## 2024-01-22 DIAGNOSIS — I119 Hypertensive heart disease without heart failure: Secondary | ICD-10-CM | POA: Diagnosis not present

## 2024-01-22 DIAGNOSIS — M549 Dorsalgia, unspecified: Secondary | ICD-10-CM | POA: Diagnosis not present

## 2024-01-22 DIAGNOSIS — L720 Epidermal cyst: Secondary | ICD-10-CM | POA: Diagnosis not present

## 2024-01-22 DIAGNOSIS — G8929 Other chronic pain: Secondary | ICD-10-CM | POA: Diagnosis not present

## 2024-01-22 DIAGNOSIS — Z794 Long term (current) use of insulin: Secondary | ICD-10-CM | POA: Diagnosis not present

## 2024-01-22 DIAGNOSIS — Z7984 Long term (current) use of oral hypoglycemic drugs: Secondary | ICD-10-CM | POA: Diagnosis not present

## 2024-01-22 DIAGNOSIS — I77810 Thoracic aortic ectasia: Secondary | ICD-10-CM | POA: Diagnosis not present

## 2024-01-22 DIAGNOSIS — Z7982 Long term (current) use of aspirin: Secondary | ICD-10-CM | POA: Diagnosis not present

## 2024-01-22 DIAGNOSIS — M51369 Other intervertebral disc degeneration, lumbar region without mention of lumbar back pain or lower extremity pain: Secondary | ICD-10-CM | POA: Diagnosis not present

## 2024-01-22 DIAGNOSIS — L02212 Cutaneous abscess of back [any part, except buttock]: Secondary | ICD-10-CM | POA: Diagnosis not present

## 2024-01-22 DIAGNOSIS — E785 Hyperlipidemia, unspecified: Secondary | ICD-10-CM | POA: Diagnosis not present

## 2024-01-22 DIAGNOSIS — I350 Nonrheumatic aortic (valve) stenosis: Secondary | ICD-10-CM | POA: Diagnosis not present

## 2024-01-22 DIAGNOSIS — M47817 Spondylosis without myelopathy or radiculopathy, lumbosacral region: Secondary | ICD-10-CM | POA: Diagnosis not present

## 2024-01-22 DIAGNOSIS — E113299 Type 2 diabetes mellitus with mild nonproliferative diabetic retinopathy without macular edema, unspecified eye: Secondary | ICD-10-CM | POA: Diagnosis not present

## 2024-01-22 DIAGNOSIS — E1142 Type 2 diabetes mellitus with diabetic polyneuropathy: Secondary | ICD-10-CM | POA: Diagnosis not present

## 2024-01-22 NOTE — Telephone Encounter (Signed)
 Spoke with patient, no information given. Pt states he will be in the office in the morning

## 2024-01-22 NOTE — Patient Outreach (Signed)
  Care Management   Visit Note  01/22/2024 Name: DONIVEN VANPATTEN MRN: [id number] DOB: 06-13-1956  Subjective: [name] is a 68 y.o. year old male who is a primary care patient of Kuneff, Renee A, DO. The Care Management team was consulted for assistance.      Engaged with patient spoke with patient by telephone.   RNCM called for scheduled outreach, patient verified full name and DOB and then stated "I do not have time for this mess today" and disconnected the call. RNCM will send to scheduler to reschedule if patient chooses to continue with scheduled outreach.   Plan: The care management team will reach out to the patient again over the next 30 days.   Larey Brick, BSN RN [facility], [facility] RN Care Manager Direct Dial: [phone]  Fax: [phone]

## 2024-01-23 ENCOUNTER — Telehealth: Payer: Self-pay | Admitting: Family Medicine

## 2024-01-23 ENCOUNTER — Encounter: Payer: Self-pay | Admitting: Family Medicine

## 2024-01-23 ENCOUNTER — Ambulatory Visit: Payer: 59 | Admitting: Family Medicine

## 2024-01-23 ENCOUNTER — Ambulatory Visit (HOSPITAL_BASED_OUTPATIENT_CLINIC_OR_DEPARTMENT_OTHER)
Admission: RE | Admit: 2024-01-23 | Discharge: 2024-01-23 | Disposition: A | Discharge status: Home / Self Care | Payer: 59 | Source: Ambulatory Visit | Attending: Family Medicine | Admitting: Family Medicine

## 2024-01-23 VITALS — BP 118/68 | HR 64 | Temp 98.7°F | Wt 214.0 lb

## 2024-01-23 DIAGNOSIS — R051 Acute cough: Secondary | ICD-10-CM | POA: Insufficient documentation

## 2024-01-23 DIAGNOSIS — Q2547 Right aortic arch: Secondary | ICD-10-CM | POA: Diagnosis not present

## 2024-01-23 DIAGNOSIS — Q240 Dextrocardia: Secondary | ICD-10-CM | POA: Diagnosis not present

## 2024-01-23 DIAGNOSIS — J180 Bronchopneumonia, unspecified organism: Secondary | ICD-10-CM

## 2024-01-23 DIAGNOSIS — R042 Hemoptysis: Secondary | ICD-10-CM | POA: Diagnosis not present

## 2024-01-23 DIAGNOSIS — R0989 Other specified symptoms and signs involving the circulatory and respiratory systems: Secondary | ICD-10-CM | POA: Insufficient documentation

## 2024-01-23 DIAGNOSIS — J984 Other disorders of lung: Secondary | ICD-10-CM | POA: Diagnosis not present

## 2024-01-23 DIAGNOSIS — R6889 Other general symptoms and signs: Secondary | ICD-10-CM

## 2024-01-23 DIAGNOSIS — R918 Other nonspecific abnormal finding of lung field: Secondary | ICD-10-CM | POA: Diagnosis not present

## 2024-01-23 LAB — POC COVID19 BINAXNOW: SARS Coronavirus 2 Ag: NEGATIVE

## 2024-01-23 LAB — POCT INFLUENZA A/B
Influenza A, POC: NEGATIVE
Influenza B, POC: NEGATIVE

## 2024-01-23 MED ORDER — LEVOFLOXACIN 500 MG PO TABS: 500.0000 mg | ORAL_TABLET | Freq: Every day | ORAL | 0 refills | Status: AC

## 2024-01-23 MED ORDER — PROMETHAZINE-DM 6.25-15 MG/5ML PO SYRP: 5.0000 mL | ORAL_SOLUTION | Freq: Four times a day (QID) | ORAL | 0 refills | Status: DC | PRN

## 2024-01-23 MED ORDER — DEXTROMETHORPHAN HBR 15 MG/5ML PO SYRP: 10.0000 mL | ORAL_SOLUTION | Freq: Four times a day (QID) | ORAL | 0 refills | Status: DC | PRN

## 2024-01-23 NOTE — Telephone Encounter (Signed)
 April from [facility] pharmacy called and stated that the manufacturer for promethazine is currently on back order. They haven't been able to get it for over a month. She suggested it be sent somewhere else or prescribe the patient a different medication. However, she did mention that they have robitussin with codeine and tussionex in stock. Please call and advise.    Please advise.

## 2024-01-23 NOTE — Addendum Note (Signed)
 Addended by: Felix Pacini A on: 01/23/2024 03:41 PM   Modules accepted: Orders

## 2024-01-23 NOTE — Telephone Encounter (Signed)
 Please call patient His chest x-ray is consistent with pneumonia.  The antibiotic I prescribed should treat appropriately.   Please schedule him for a follow-up in 10-11 days on his pneumonia.   The cough syrup I prescribed to his pharmacy, it is on backorder.  The other 2 they have in stock he is allergic to.  Therefore I did call in some plain Robitussin to help with his cough symptoms.

## 2024-01-23 NOTE — Progress Notes (Addendum)
 [name] , 12-04-1955, 68 y.o., male MRN: [id number] Patient Care Team    Relationship Specialty Notifications Start End  Natalia Leatherwood, DO PCP - General Family Medicine  11/29/18   Rennis Chris, MD Consulting Physician Ophthalmology  01/09/19   Antoine Poche, MD Consulting Physician Cardiology  07/19/23   Ricky Stabs, RN [facility] Care Management General Practice  11/14/23    Comment: [phone]    Chief Complaint  Patient presents with   Cough    Started Friday; cough, congestion, body aches, fatigue. Denies fever. Pt has not taken anything to help with symptoms.      Subjective: [name] is a 68 y.o. Pt presents for an OV with complaints of worsening cough, congestion, body aches and fatigue since Friday, has had an upper respiratory infection greater than 2 weeks.  He had been treated with an extended course of Bactrim for another condition during this time . Patient has not tried anything for his symptoms. He states on Friday he noticed bright red blood when he would cough.  He states there was some mucus mixed in with the blood.  He has not seen any blood with his cough since Friday. He denies any fevers or chills.  He states he feels rattling in his chest.  He is fatigued.     08/21/2023    1:04 PM 03/24/2023    1:35 PM 12/09/2022    1:43 PM 12/05/2022    9:54 AM 08/19/2022   12:57 PM  Depression screen PHQ 2/9  Decreased Interest 0 0 2 1 0  Down, Depressed, Hopeless 0 0 0 0 0  PHQ - 2 Score 0 0 2 1 0  Altered sleeping   0    Tired, decreased energy   2    Change in appetite   3    Feeling bad or failure about yourself    0    Trouble concentrating   2    Moving slowly or fidgety/restless   0    Suicidal thoughts   0    PHQ-9 Score   9      Allergies  Allergen Reactions   Gabapentin Other (See Comments)    Paranoia/hallucinations   Hydrocodone Other (See Comments)    PT reports  Confusion when tacking Hydrocodone   Nsaids     History of  heart disease, CAD with history of MI, CABG status post stent   Prednisone     Paranoia   Tramadol    Penicillins Other (See Comments)    Childhood unsure if still allgeric   Social History   Social History Narrative   Marital status/children/pets: Single   Education/employment: HS grad, 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:      -smoke alarm in the home:Yes     - wears seatbelt: Yes      Past Medical History:  Diagnosis Date   Alcohol addiction (HCC)    Anxiety    Arthritis    Bulging of lumbar intervertebral disc 2017   MRI: L3-4, L4-5 and L5-S1- facet hypertrophy and formainal stenosis   CAD (coronary artery disease)    Coronary artery disease with hx of myocardial infarct w/o hx of CABG 2004   Depression    Dextrocardia    Diabetes (HCC)    Heart attack (HCC) 2004   Stent placement 2008   Hyperlipidemia    Hypertension    Insomnia    Past Surgical History:  Procedure Laterality Date   CORONARY ANGIOPLASTY WITH STENT PLACEMENT  2008   Family History  Problem Relation Age of Onset   Heart attack Mother    Heart disease Mother    Diabetes Mother    Arthritis Mother    Hyperlipidemia Mother    Hypertension Mother    Breast cancer Mother    Heart attack Father    Heart disease Father    Alcohol abuse Father    Early death Father    Arthritis Sister    Heart disease Sister    Hyperlipidemia Sister    Asthma Sister    COPD Sister    Diabetes Sister    Alcohol abuse Sister    Allergies as of 01/23/2024       Reactions   Gabapentin Other (See Comments)   Paranoia/hallucinations   Hydrocodone Other (See Comments)   PT reports  Confusion when tacking Hydrocodone   Nsaids    History of heart disease, CAD with history of MI, CABG status post stent   Prednisone    Paranoia   Tramadol    Penicillins Other (See Comments)   Childhood unsure if still allgeric        Medication List        Accurate as of January 23, 2024  3:40 PM. If you have any  questions, ask your nurse or doctor.          Accu-Chek FastClix Lancets Misc   aspirin EC 81 MG tablet Take 1 tablet (81 mg total) by mouth daily.   dapagliflozin propanediol 10 MG Tabs tablet Commonly known as: Farxiga TAKE 1 TABLET BY MOUTH  DAILY BEFORE BREAKFAST   dextromethorphan 15 MG/5ML syrup Take 10 mLs (30 mg total) by mouth 4 (four) times daily as needed for cough. Started by: Felix Pacini   diazepam 5 MG tablet Commonly known as: VALIUM Take 1 tablet (5 mg total) by mouth every 6 (six) hours as needed for anxiety.   Janumet XR [phone] MG Tb24 Generic drug: SitaGLIPtin-MetFORMIN HCl Take 1 tablet by mouth daily.   Lantus SoloStar 100 UNIT/ML Solostar Pen Generic drug: insulin glargine Inject 70 Units into the skin daily.   levofloxacin 500 MG tablet Commonly known as: LEVAQUIN Take 1 tablet (500 mg total) by mouth daily for 7 days. Started by: Felix Pacini   lisinopril 20 MG tablet Commonly known as: ZESTRIL Take 1 tablet (20 mg total) by mouth daily.   naproxen 500 MG tablet Commonly known as: NAPROSYN TAKE 1 TABLET BY MOUTH EVERY 12 HOURS W/FOOD AS NEEDED ONLY *NEW PRESCRIPTION REQUEST*   nitroGLYCERIN 0.4 MG SL tablet Commonly known as: NITROSTAT DISSOLVE 1 TABLET UNDER THE TONGUE EVERY 5 MINUTES AS  NEEDED FOR CHEST PAIN. MAX  OF 3 TABLETS IN 15 MINUTES. CALL 911 IF PAIN PERSISTS.   omega-3 acid ethyl esters 1 g capsule Commonly known as: LOVAZA Take 1 capsule (1 g total) by mouth daily.   OneTouch Verio test strip Generic drug: glucose blood USE AS DIRECTED *NEW PRESCRIPTION REQUEST*   promethazine-dextromethorphan 6.25-15 MG/5ML syrup Commonly known as: PROMETHAZINE-DM Take 5 mLs by mouth 4 (four) times daily as needed for cough. Started by: Felix Pacini   rosuvastatin 40 MG tablet Commonly known as: CRESTOR Take 1 tablet (40 mg total) by mouth at bedtime.   traZODone 100 MG tablet Commonly known as: DESYREL Take 1 tablet (100 mg  total) by mouth at bedtime as needed for sleep.   UltiCare Pen Needles 31G  X 5 MM Misc Generic drug: Insulin Pen Needle USE TO INJECT PEN INSULIN *NEW PRESCRIPTION REQUEST*        All past medical history, surgical history, allergies, family history, immunizations andmedications were updated in the EMR today and reviewed under the history and medication portions of their EMR.     ROS Negative, with the exception of above mentioned in HPI   Objective:  BP 118/68   Pulse 64   Temp 98.7 F (37.1 C)   Wt 214 lb (97.1 kg)   SpO2 97%   BMI 29.85 kg/m  Body mass index is 29.85 kg/m. Physical Exam Vitals and nursing note reviewed.  Constitutional:      General: He is not in acute distress.    Appearance: Normal appearance. He is not ill-appearing, toxic-appearing or diaphoretic.  HENT:     Head: Normocephalic and atraumatic.     Right Ear: Tympanic membrane and ear canal normal.     Left Ear: Tympanic membrane and ear canal normal.     Nose: Congestion and rhinorrhea present.     Mouth/Throat:     Mouth: Mucous membranes are moist.     Pharynx: No oropharyngeal exudate or posterior oropharyngeal erythema.  Eyes:     General: No scleral icterus.       Right eye: No discharge.        Left eye: No discharge.     Extraocular Movements: Extraocular movements intact.     Pupils: Pupils are equal, round, and reactive to light.  Cardiovascular:     Rate and Rhythm: Normal rate and regular rhythm.     Heart sounds: Murmur heard.  Pulmonary:     Effort: No respiratory distress.     Breath sounds: Rhonchi present. No wheezing or rales.     Comments: Diminished breath sounds bilateral lower lobes Musculoskeletal:     Cervical back: Neck supple. No tenderness.     Right lower leg: No edema.     Left lower leg: No edema.  Lymphadenopathy:     Cervical: No cervical adenopathy.  Skin:    General: Skin is warm and dry.     Coloration: Skin is not jaundiced or pale.     Findings:  No rash.  Neurological:     Mental Status: He is alert and oriented to person, place, and time. Mental status is at baseline.  Psychiatric:        Mood and Affect: Mood normal.        Behavior: Behavior normal.        Thought Content: Thought content normal.        Judgment: Judgment normal.     No results found. DG Chest 2 View Result Date: 01/23/2024 CLINICAL DATA:  Cough for 1 month, sometimes productive. Coughed up blood. EXAM: CHEST - 2 VIEW COMPARISON:  03/18/2019. FINDINGS: The heart size and mediastinal contours are stable. Dextrocardia and a right-sided aortic arch are noted. Patchy airspace disease is present in the lungs bilaterally with left upper lobe consolidation. No effusion or pneumothorax is seen. Degenerative changes are present in the thoracic spine. IMPRESSION: 1. Patchy airspace disease in the lungs bilaterally with left upper lobe consolidation, compatible with multifocal pneumonia. Follow-up is recommended until resolution. 2. Dextrocardia and right-sided aortic arch. Electronically Signed   By: Thornell Sartorius M.D.   On: 01/23/2024 14:16   Results for orders placed or performed in visit on 01/23/24 (from the past 24 hours)  POCT Influenza A/B  Status: Normal   Collection Time: 01/23/24  1:03 PM  Result Value Ref Range   Influenza A, POC Negative Negative   Influenza B, POC Negative Negative  POC COVID-19 BinaxNow     Status: Normal   Collection Time: 01/23/24  1:04 PM  Result Value Ref Range   SARS Coronavirus 2 Ag Negative Negative    Assessment/Plan: [name] is a 68 y.o. male present for OV for  Bronchial pneumonia: Chest x-ray ordered today-patient will be called with results and further plan discussed at that time. COVID and flu test are negative today. Rest, hydrate.  mucinex, nettie pot or nasal saline.  Promethazine-DM cough syrup> back order. Pt allergic to other forms with hydrocodone etc.> robitussin called in  Levaquin x 7 days  prescribed, take until completed.  If cough present it can last up to 6-8 weeks.  F/U 2 weeks if not improved.   Reviewed expectations re: course of current medical issues. Discussed self-management of symptoms. Outlined signs and symptoms indicating need for more acute intervention. Patient verbalized understanding and all questions were answered. Patient received an After-Visit Summary.    Orders Placed This Encounter  Procedures   DG Chest 2 View   POC COVID-19 BinaxNow   POCT Influenza A/B   Meds ordered this encounter  Medications   promethazine-dextromethorphan (PROMETHAZINE-DM) 6.25-15 MG/5ML syrup    Sig: Take 5 mLs by mouth 4 (four) times daily as needed for cough.    Dispense:  118 mL    Refill:  0   levofloxacin (LEVAQUIN) 500 MG tablet    Sig: Take 1 tablet (500 mg total) by mouth daily for 7 days.    Dispense:  7 tablet    Refill:  0   dextromethorphan 15 MG/5ML syrup    Sig: Take 10 mLs (30 mg total) by mouth 4 (four) times daily as needed for cough.    Dispense:  120 mL    Refill:  0   Referral Orders  No referral(s) requested today     Note is dictated utilizing voice recognition software. Although note has been proof read prior to signing, occasional typographical errors still can be missed. If any questions arise, please do not hesitate to call for verification.   electronically signed by:  Felix Pacini, DO  [facility] - [place]

## 2024-01-24 NOTE — Telephone Encounter (Signed)
 Pt given results. Pt scheduled for 3/12,

## 2024-01-25 DIAGNOSIS — L02212 Cutaneous abscess of back [any part, except buttock]: Secondary | ICD-10-CM | POA: Diagnosis not present

## 2024-01-25 DIAGNOSIS — E1142 Type 2 diabetes mellitus with diabetic polyneuropathy: Secondary | ICD-10-CM | POA: Diagnosis not present

## 2024-01-25 DIAGNOSIS — I119 Hypertensive heart disease without heart failure: Secondary | ICD-10-CM | POA: Diagnosis not present

## 2024-01-25 DIAGNOSIS — L720 Epidermal cyst: Secondary | ICD-10-CM | POA: Diagnosis not present

## 2024-01-25 DIAGNOSIS — E1169 Type 2 diabetes mellitus with other specified complication: Secondary | ICD-10-CM | POA: Diagnosis not present

## 2024-01-25 DIAGNOSIS — I2510 Atherosclerotic heart disease of native coronary artery without angina pectoris: Secondary | ICD-10-CM | POA: Diagnosis not present

## 2024-01-25 DIAGNOSIS — M51369 Other intervertebral disc degeneration, lumbar region without mention of lumbar back pain or lower extremity pain: Secondary | ICD-10-CM | POA: Diagnosis not present

## 2024-01-25 DIAGNOSIS — I77810 Thoracic aortic ectasia: Secondary | ICD-10-CM | POA: Diagnosis not present

## 2024-01-25 DIAGNOSIS — G4700 Insomnia, unspecified: Secondary | ICD-10-CM | POA: Diagnosis not present

## 2024-01-25 DIAGNOSIS — M47817 Spondylosis without myelopathy or radiculopathy, lumbosacral region: Secondary | ICD-10-CM | POA: Diagnosis not present

## 2024-01-25 DIAGNOSIS — M549 Dorsalgia, unspecified: Secondary | ICD-10-CM | POA: Diagnosis not present

## 2024-01-25 DIAGNOSIS — I350 Nonrheumatic aortic (valve) stenosis: Secondary | ICD-10-CM | POA: Diagnosis not present

## 2024-01-25 DIAGNOSIS — E113299 Type 2 diabetes mellitus with mild nonproliferative diabetic retinopathy without macular edema, unspecified eye: Secondary | ICD-10-CM | POA: Diagnosis not present

## 2024-01-25 DIAGNOSIS — G8929 Other chronic pain: Secondary | ICD-10-CM | POA: Diagnosis not present

## 2024-01-25 DIAGNOSIS — Z7984 Long term (current) use of oral hypoglycemic drugs: Secondary | ICD-10-CM | POA: Diagnosis not present

## 2024-01-25 DIAGNOSIS — Z794 Long term (current) use of insulin: Secondary | ICD-10-CM | POA: Diagnosis not present

## 2024-01-25 DIAGNOSIS — E785 Hyperlipidemia, unspecified: Secondary | ICD-10-CM | POA: Diagnosis not present

## 2024-01-25 DIAGNOSIS — Z7982 Long term (current) use of aspirin: Secondary | ICD-10-CM | POA: Diagnosis not present

## 2024-01-29 DIAGNOSIS — Z7984 Long term (current) use of oral hypoglycemic drugs: Secondary | ICD-10-CM | POA: Diagnosis not present

## 2024-01-29 DIAGNOSIS — L720 Epidermal cyst: Secondary | ICD-10-CM | POA: Diagnosis not present

## 2024-01-29 DIAGNOSIS — I350 Nonrheumatic aortic (valve) stenosis: Secondary | ICD-10-CM | POA: Diagnosis not present

## 2024-01-29 DIAGNOSIS — E1169 Type 2 diabetes mellitus with other specified complication: Secondary | ICD-10-CM | POA: Diagnosis not present

## 2024-01-29 DIAGNOSIS — E113299 Type 2 diabetes mellitus with mild nonproliferative diabetic retinopathy without macular edema, unspecified eye: Secondary | ICD-10-CM | POA: Diagnosis not present

## 2024-01-29 DIAGNOSIS — G8929 Other chronic pain: Secondary | ICD-10-CM | POA: Diagnosis not present

## 2024-01-29 DIAGNOSIS — E1142 Type 2 diabetes mellitus with diabetic polyneuropathy: Secondary | ICD-10-CM | POA: Diagnosis not present

## 2024-01-29 DIAGNOSIS — I119 Hypertensive heart disease without heart failure: Secondary | ICD-10-CM | POA: Diagnosis not present

## 2024-01-29 DIAGNOSIS — M51369 Other intervertebral disc degeneration, lumbar region without mention of lumbar back pain or lower extremity pain: Secondary | ICD-10-CM | POA: Diagnosis not present

## 2024-01-29 DIAGNOSIS — I77810 Thoracic aortic ectasia: Secondary | ICD-10-CM | POA: Diagnosis not present

## 2024-01-29 DIAGNOSIS — G4700 Insomnia, unspecified: Secondary | ICD-10-CM | POA: Diagnosis not present

## 2024-01-29 DIAGNOSIS — Z7982 Long term (current) use of aspirin: Secondary | ICD-10-CM | POA: Diagnosis not present

## 2024-01-29 DIAGNOSIS — I2510 Atherosclerotic heart disease of native coronary artery without angina pectoris: Secondary | ICD-10-CM | POA: Diagnosis not present

## 2024-01-29 DIAGNOSIS — L02212 Cutaneous abscess of back [any part, except buttock]: Secondary | ICD-10-CM | POA: Diagnosis not present

## 2024-01-29 DIAGNOSIS — M47817 Spondylosis without myelopathy or radiculopathy, lumbosacral region: Secondary | ICD-10-CM | POA: Diagnosis not present

## 2024-01-29 DIAGNOSIS — M549 Dorsalgia, unspecified: Secondary | ICD-10-CM | POA: Diagnosis not present

## 2024-01-29 DIAGNOSIS — Z794 Long term (current) use of insulin: Secondary | ICD-10-CM | POA: Diagnosis not present

## 2024-01-29 DIAGNOSIS — E785 Hyperlipidemia, unspecified: Secondary | ICD-10-CM | POA: Diagnosis not present

## 2024-02-01 DIAGNOSIS — E118 Type 2 diabetes mellitus with unspecified complications: Secondary | ICD-10-CM | POA: Diagnosis not present

## 2024-02-02 DIAGNOSIS — M47817 Spondylosis without myelopathy or radiculopathy, lumbosacral region: Secondary | ICD-10-CM | POA: Diagnosis not present

## 2024-02-02 DIAGNOSIS — M51369 Other intervertebral disc degeneration, lumbar region without mention of lumbar back pain or lower extremity pain: Secondary | ICD-10-CM | POA: Diagnosis not present

## 2024-02-02 DIAGNOSIS — Z7984 Long term (current) use of oral hypoglycemic drugs: Secondary | ICD-10-CM | POA: Diagnosis not present

## 2024-02-02 DIAGNOSIS — E785 Hyperlipidemia, unspecified: Secondary | ICD-10-CM | POA: Diagnosis not present

## 2024-02-02 DIAGNOSIS — G8929 Other chronic pain: Secondary | ICD-10-CM | POA: Diagnosis not present

## 2024-02-02 DIAGNOSIS — L720 Epidermal cyst: Secondary | ICD-10-CM | POA: Diagnosis not present

## 2024-02-02 DIAGNOSIS — E1142 Type 2 diabetes mellitus with diabetic polyneuropathy: Secondary | ICD-10-CM | POA: Diagnosis not present

## 2024-02-02 DIAGNOSIS — L02212 Cutaneous abscess of back [any part, except buttock]: Secondary | ICD-10-CM | POA: Diagnosis not present

## 2024-02-02 DIAGNOSIS — M549 Dorsalgia, unspecified: Secondary | ICD-10-CM | POA: Diagnosis not present

## 2024-02-02 DIAGNOSIS — G4700 Insomnia, unspecified: Secondary | ICD-10-CM | POA: Diagnosis not present

## 2024-02-02 DIAGNOSIS — I2510 Atherosclerotic heart disease of native coronary artery without angina pectoris: Secondary | ICD-10-CM | POA: Diagnosis not present

## 2024-02-02 DIAGNOSIS — E1169 Type 2 diabetes mellitus with other specified complication: Secondary | ICD-10-CM | POA: Diagnosis not present

## 2024-02-02 DIAGNOSIS — I77810 Thoracic aortic ectasia: Secondary | ICD-10-CM | POA: Diagnosis not present

## 2024-02-02 DIAGNOSIS — Z794 Long term (current) use of insulin: Secondary | ICD-10-CM | POA: Diagnosis not present

## 2024-02-02 DIAGNOSIS — I119 Hypertensive heart disease without heart failure: Secondary | ICD-10-CM | POA: Diagnosis not present

## 2024-02-02 DIAGNOSIS — E113299 Type 2 diabetes mellitus with mild nonproliferative diabetic retinopathy without macular edema, unspecified eye: Secondary | ICD-10-CM | POA: Diagnosis not present

## 2024-02-02 DIAGNOSIS — Z7982 Long term (current) use of aspirin: Secondary | ICD-10-CM | POA: Diagnosis not present

## 2024-02-02 DIAGNOSIS — I350 Nonrheumatic aortic (valve) stenosis: Secondary | ICD-10-CM | POA: Diagnosis not present

## 2024-02-05 DIAGNOSIS — I77810 Thoracic aortic ectasia: Secondary | ICD-10-CM | POA: Diagnosis not present

## 2024-02-05 DIAGNOSIS — E1142 Type 2 diabetes mellitus with diabetic polyneuropathy: Secondary | ICD-10-CM | POA: Diagnosis not present

## 2024-02-05 DIAGNOSIS — M47817 Spondylosis without myelopathy or radiculopathy, lumbosacral region: Secondary | ICD-10-CM | POA: Diagnosis not present

## 2024-02-05 DIAGNOSIS — G8929 Other chronic pain: Secondary | ICD-10-CM | POA: Diagnosis not present

## 2024-02-05 DIAGNOSIS — L02212 Cutaneous abscess of back [any part, except buttock]: Secondary | ICD-10-CM | POA: Diagnosis not present

## 2024-02-05 DIAGNOSIS — I119 Hypertensive heart disease without heart failure: Secondary | ICD-10-CM | POA: Diagnosis not present

## 2024-02-05 DIAGNOSIS — I350 Nonrheumatic aortic (valve) stenosis: Secondary | ICD-10-CM | POA: Diagnosis not present

## 2024-02-05 DIAGNOSIS — L720 Epidermal cyst: Secondary | ICD-10-CM | POA: Diagnosis not present

## 2024-02-05 DIAGNOSIS — G4700 Insomnia, unspecified: Secondary | ICD-10-CM | POA: Diagnosis not present

## 2024-02-05 DIAGNOSIS — M51369 Other intervertebral disc degeneration, lumbar region without mention of lumbar back pain or lower extremity pain: Secondary | ICD-10-CM | POA: Diagnosis not present

## 2024-02-05 DIAGNOSIS — Z7984 Long term (current) use of oral hypoglycemic drugs: Secondary | ICD-10-CM | POA: Diagnosis not present

## 2024-02-05 DIAGNOSIS — Z794 Long term (current) use of insulin: Secondary | ICD-10-CM | POA: Diagnosis not present

## 2024-02-05 DIAGNOSIS — E113299 Type 2 diabetes mellitus with mild nonproliferative diabetic retinopathy without macular edema, unspecified eye: Secondary | ICD-10-CM | POA: Diagnosis not present

## 2024-02-05 DIAGNOSIS — E1169 Type 2 diabetes mellitus with other specified complication: Secondary | ICD-10-CM | POA: Diagnosis not present

## 2024-02-05 DIAGNOSIS — I2510 Atherosclerotic heart disease of native coronary artery without angina pectoris: Secondary | ICD-10-CM | POA: Diagnosis not present

## 2024-02-05 DIAGNOSIS — E785 Hyperlipidemia, unspecified: Secondary | ICD-10-CM | POA: Diagnosis not present

## 2024-02-05 DIAGNOSIS — M549 Dorsalgia, unspecified: Secondary | ICD-10-CM | POA: Diagnosis not present

## 2024-02-05 DIAGNOSIS — Z7982 Long term (current) use of aspirin: Secondary | ICD-10-CM | POA: Diagnosis not present

## 2024-02-07 ENCOUNTER — Ambulatory Visit: Payer: 59 | Admitting: Family Medicine

## 2024-02-07 ENCOUNTER — Telehealth: Payer: Self-pay | Admitting: *Deleted

## 2024-02-07 NOTE — Progress Notes (Signed)
 Complex Care Management Care Guide Note  02/07/2024 Name: [name] MRN: [id number] DOB: May 18, 1956  [name] is a 68 y.o. year old male who is a primary care patient of Claiborne Billings, Renee A, DO and is actively engaged with the care management team. I reached out to [name] by phone today to assist with re-scheduling  with the RN Case Manager.  Follow up plan: Unsuccessful telephone outreach attempt made.   Gwenevere Ghazi  [facility]  [facility], [facility] Guide  Direct Dial: [phone]  Fax [phone]

## 2024-02-09 DIAGNOSIS — M549 Dorsalgia, unspecified: Secondary | ICD-10-CM | POA: Diagnosis not present

## 2024-02-09 DIAGNOSIS — M47817 Spondylosis without myelopathy or radiculopathy, lumbosacral region: Secondary | ICD-10-CM | POA: Diagnosis not present

## 2024-02-09 DIAGNOSIS — L720 Epidermal cyst: Secondary | ICD-10-CM | POA: Diagnosis not present

## 2024-02-09 DIAGNOSIS — E113299 Type 2 diabetes mellitus with mild nonproliferative diabetic retinopathy without macular edema, unspecified eye: Secondary | ICD-10-CM | POA: Diagnosis not present

## 2024-02-09 DIAGNOSIS — I77810 Thoracic aortic ectasia: Secondary | ICD-10-CM | POA: Diagnosis not present

## 2024-02-09 DIAGNOSIS — I119 Hypertensive heart disease without heart failure: Secondary | ICD-10-CM | POA: Diagnosis not present

## 2024-02-09 DIAGNOSIS — G8929 Other chronic pain: Secondary | ICD-10-CM | POA: Diagnosis not present

## 2024-02-09 DIAGNOSIS — L02212 Cutaneous abscess of back [any part, except buttock]: Secondary | ICD-10-CM | POA: Diagnosis not present

## 2024-02-09 DIAGNOSIS — G4700 Insomnia, unspecified: Secondary | ICD-10-CM | POA: Diagnosis not present

## 2024-02-09 DIAGNOSIS — E1142 Type 2 diabetes mellitus with diabetic polyneuropathy: Secondary | ICD-10-CM | POA: Diagnosis not present

## 2024-02-09 DIAGNOSIS — Z7984 Long term (current) use of oral hypoglycemic drugs: Secondary | ICD-10-CM | POA: Diagnosis not present

## 2024-02-09 DIAGNOSIS — Z794 Long term (current) use of insulin: Secondary | ICD-10-CM | POA: Diagnosis not present

## 2024-02-09 DIAGNOSIS — Z7982 Long term (current) use of aspirin: Secondary | ICD-10-CM | POA: Diagnosis not present

## 2024-02-09 DIAGNOSIS — E1169 Type 2 diabetes mellitus with other specified complication: Secondary | ICD-10-CM | POA: Diagnosis not present

## 2024-02-09 DIAGNOSIS — M51369 Other intervertebral disc degeneration, lumbar region without mention of lumbar back pain or lower extremity pain: Secondary | ICD-10-CM | POA: Diagnosis not present

## 2024-02-09 DIAGNOSIS — E785 Hyperlipidemia, unspecified: Secondary | ICD-10-CM | POA: Diagnosis not present

## 2024-02-09 DIAGNOSIS — I2510 Atherosclerotic heart disease of native coronary artery without angina pectoris: Secondary | ICD-10-CM | POA: Diagnosis not present

## 2024-02-09 DIAGNOSIS — I350 Nonrheumatic aortic (valve) stenosis: Secondary | ICD-10-CM | POA: Diagnosis not present

## 2024-02-13 ENCOUNTER — Ambulatory Visit (INDEPENDENT_AMBULATORY_CARE_PROVIDER_SITE_OTHER): Payer: 59 | Admitting: Surgery

## 2024-02-13 ENCOUNTER — Encounter: Payer: Self-pay | Admitting: Surgery

## 2024-02-13 VITALS — BP 103/64 | HR 55 | Temp 98.2°F | Resp 14 | Ht 71.0 in | Wt 214.0 lb

## 2024-02-13 DIAGNOSIS — S21201D Unspecified open wound of right back wall of thorax without penetration into thoracic cavity, subsequent encounter: Secondary | ICD-10-CM | POA: Diagnosis not present

## 2024-02-13 NOTE — Progress Notes (Unsigned)
 [facility] Note   HPI:  68 y.o. Male presents to clinic for follow-up for back wound status post incision and drainage of an infected epidermoid cyst of the back by his PCP and ED provider.  Patient is still undergoing twice weekly dressing changes by a home health care nurse.  He denies any pain, drainage, fever, or chills.  He is no longer taking any antibiotics.  His sister is not helping him with dressing changes currently.  Review of Systems:  All other review of systems: otherwise negative   Vital Signs:  BP 103/64   Pulse (!) 55   Temp 98.2 F (36.8 C) (Oral)   Resp 14   Ht 5\' 11"  (1.803 m)   Wt 214 lb (97.1 kg)   SpO2 94%   BMI 29.85 kg/m    Physical Exam:  Physical Exam Vitals reviewed.  Constitutional:      Appearance: Normal appearance.  Skin:    Comments: Right mid back wound with significantly improved erythema and induration, virtually no erythema present at this time, 1.5 cm opening tracking at the 10 to 11 o'clock position; nontender to palpation, no active drainage   Neurological:     Mental Status: He is alert.     Laboratory studies: None  Imaging:  None  Assessment:  68 y.o. yo Male who presents for follow-up of low back wound status post incision and drainage by PCP and ED  Plan:  -Area has significantly improved since his last visit with me -Advised that he should continue with packing changes.  Given that there is not a significant amount of drainage, twice weekly dressing changes are okay at this time.  Dressing change was performed by myself -Again we discussed that as the area heals, less and less packing will be able to be placed.  Once the wound is no longer able to take any packing, he should keep the area covered until it fully scabs over -Call the office if the area progressively worsens, with pain erythema, fever, chills, or purulent drainage -Follow up with me in 1.5 months  All of the above recommendations were  discussed with the patient, and all of patient's questions were answered to his expressed satisfaction.  Note: Portions of this report may have been transcribed using voice recognition software. Every effort has been made to ensure accuracy; however, inadvertent computerized transcription errors may still be present.   Theophilus Kinds, DO [facility] [address] [place], [place] [address] [phone] (office)

## 2024-02-15 NOTE — Progress Notes (Signed)
 Complex Care Management Care Guide Note  02/15/2024 Name: [name] MRN: [id number] DOB: June 13, 1956  [name] is a 68 y.o. year old male who is a primary care patient of Claiborne Billings, Renee A, DO and is actively engaged with the care management team. I reached out to [name] by phone today to assist with re-scheduling  with the RN Case Manager.  Follow up plan: Unsuccessful telephone outreach attempt made. A HIPAA compliant phone message was left for the patient providing contact information and requesting a return call.No further outreach attempts will be made at this time. We have been unable to contact the patient to reschedule for complex care management services.   Gwenevere Ghazi  [facility]  [facility], [facility] Guide  Direct Dial: [phone]  Fax [phone]

## 2024-02-16 DIAGNOSIS — E785 Hyperlipidemia, unspecified: Secondary | ICD-10-CM | POA: Diagnosis not present

## 2024-02-16 DIAGNOSIS — L02212 Cutaneous abscess of back [any part, except buttock]: Secondary | ICD-10-CM | POA: Diagnosis not present

## 2024-02-16 DIAGNOSIS — M549 Dorsalgia, unspecified: Secondary | ICD-10-CM | POA: Diagnosis not present

## 2024-02-16 DIAGNOSIS — E1169 Type 2 diabetes mellitus with other specified complication: Secondary | ICD-10-CM | POA: Diagnosis not present

## 2024-02-16 DIAGNOSIS — I119 Hypertensive heart disease without heart failure: Secondary | ICD-10-CM | POA: Diagnosis not present

## 2024-02-16 DIAGNOSIS — M47817 Spondylosis without myelopathy or radiculopathy, lumbosacral region: Secondary | ICD-10-CM | POA: Diagnosis not present

## 2024-02-16 DIAGNOSIS — Z7982 Long term (current) use of aspirin: Secondary | ICD-10-CM | POA: Diagnosis not present

## 2024-02-16 DIAGNOSIS — I350 Nonrheumatic aortic (valve) stenosis: Secondary | ICD-10-CM | POA: Diagnosis not present

## 2024-02-16 DIAGNOSIS — E1142 Type 2 diabetes mellitus with diabetic polyneuropathy: Secondary | ICD-10-CM | POA: Diagnosis not present

## 2024-02-16 DIAGNOSIS — G4700 Insomnia, unspecified: Secondary | ICD-10-CM | POA: Diagnosis not present

## 2024-02-16 DIAGNOSIS — E113299 Type 2 diabetes mellitus with mild nonproliferative diabetic retinopathy without macular edema, unspecified eye: Secondary | ICD-10-CM | POA: Diagnosis not present

## 2024-02-16 DIAGNOSIS — L720 Epidermal cyst: Secondary | ICD-10-CM | POA: Diagnosis not present

## 2024-02-16 DIAGNOSIS — I77810 Thoracic aortic ectasia: Secondary | ICD-10-CM | POA: Diagnosis not present

## 2024-02-16 DIAGNOSIS — M51369 Other intervertebral disc degeneration, lumbar region without mention of lumbar back pain or lower extremity pain: Secondary | ICD-10-CM | POA: Diagnosis not present

## 2024-02-16 DIAGNOSIS — I2510 Atherosclerotic heart disease of native coronary artery without angina pectoris: Secondary | ICD-10-CM | POA: Diagnosis not present

## 2024-02-16 DIAGNOSIS — G8929 Other chronic pain: Secondary | ICD-10-CM | POA: Diagnosis not present

## 2024-02-16 DIAGNOSIS — Z7984 Long term (current) use of oral hypoglycemic drugs: Secondary | ICD-10-CM | POA: Diagnosis not present

## 2024-02-16 DIAGNOSIS — Z794 Long term (current) use of insulin: Secondary | ICD-10-CM | POA: Diagnosis not present

## 2024-02-19 ENCOUNTER — Telehealth: Payer: Self-pay | Admitting: *Deleted

## 2024-02-19 ENCOUNTER — Ambulatory Visit: Payer: 59 | Admitting: Family Medicine

## 2024-02-19 DIAGNOSIS — G4700 Insomnia, unspecified: Secondary | ICD-10-CM | POA: Diagnosis not present

## 2024-02-19 DIAGNOSIS — M47817 Spondylosis without myelopathy or radiculopathy, lumbosacral region: Secondary | ICD-10-CM | POA: Diagnosis not present

## 2024-02-19 DIAGNOSIS — I77810 Thoracic aortic ectasia: Secondary | ICD-10-CM | POA: Diagnosis not present

## 2024-02-19 DIAGNOSIS — E1142 Type 2 diabetes mellitus with diabetic polyneuropathy: Secondary | ICD-10-CM | POA: Diagnosis not present

## 2024-02-19 DIAGNOSIS — I119 Hypertensive heart disease without heart failure: Secondary | ICD-10-CM | POA: Diagnosis not present

## 2024-02-19 DIAGNOSIS — M549 Dorsalgia, unspecified: Secondary | ICD-10-CM | POA: Diagnosis not present

## 2024-02-19 DIAGNOSIS — Z7982 Long term (current) use of aspirin: Secondary | ICD-10-CM | POA: Diagnosis not present

## 2024-02-19 DIAGNOSIS — E785 Hyperlipidemia, unspecified: Secondary | ICD-10-CM | POA: Diagnosis not present

## 2024-02-19 DIAGNOSIS — L720 Epidermal cyst: Secondary | ICD-10-CM | POA: Diagnosis not present

## 2024-02-19 DIAGNOSIS — L02212 Cutaneous abscess of back [any part, except buttock]: Secondary | ICD-10-CM | POA: Diagnosis not present

## 2024-02-19 DIAGNOSIS — I2510 Atherosclerotic heart disease of native coronary artery without angina pectoris: Secondary | ICD-10-CM | POA: Diagnosis not present

## 2024-02-19 DIAGNOSIS — M51369 Other intervertebral disc degeneration, lumbar region without mention of lumbar back pain or lower extremity pain: Secondary | ICD-10-CM | POA: Diagnosis not present

## 2024-02-19 DIAGNOSIS — E113299 Type 2 diabetes mellitus with mild nonproliferative diabetic retinopathy without macular edema, unspecified eye: Secondary | ICD-10-CM | POA: Diagnosis not present

## 2024-02-19 DIAGNOSIS — G8929 Other chronic pain: Secondary | ICD-10-CM | POA: Diagnosis not present

## 2024-02-19 DIAGNOSIS — Z7984 Long term (current) use of oral hypoglycemic drugs: Secondary | ICD-10-CM | POA: Diagnosis not present

## 2024-02-19 DIAGNOSIS — I350 Nonrheumatic aortic (valve) stenosis: Secondary | ICD-10-CM | POA: Diagnosis not present

## 2024-02-19 DIAGNOSIS — E1169 Type 2 diabetes mellitus with other specified complication: Secondary | ICD-10-CM | POA: Diagnosis not present

## 2024-02-19 DIAGNOSIS — Z794 Long term (current) use of insulin: Secondary | ICD-10-CM | POA: Diagnosis not present

## 2024-02-19 NOTE — Progress Notes (Signed)
 Complex Care Management Care Guide Note  02/19/2024 Name: BRACKEN MOFFA MRN: [id number] DOB: 03-Aug-1956  [name] is a 68 y.o. year old male who is a primary care patient of Claiborne Billings, Renee A, DO and is actively engaged with the care management team. I reached out to [name] by phone today to assist with re-scheduling  with the RN Case Manager.  Follow up plan: Telephone appointment with complex care management team member scheduled for:  4/1  Gwenevere Ghazi  [facility]  [facility], [facility] Guide  Direct Dial: [phone]  Fax [phone]

## 2024-02-19 NOTE — Patient Instructions (Signed)

## 2024-02-19 NOTE — Progress Notes (Unsigned)
    No show- pt thought his appt was 3/25> rescheduled to 4/1 No charge

## 2024-02-20 NOTE — Progress Notes (Signed)
 [name] , 04-29-1956, 68 y.o., male MRN: [id number] Patient Care Team    Relationship Specialty Notifications Start End  Natalia Leatherwood, DO PCP - General Family Medicine  11/29/18   Rennis Chris, MD Consulting Physician Ophthalmology  01/09/19   Antoine Poche, MD Consulting Physician Cardiology  07/19/23     Chief Complaint  Patient presents with   Diabetes     Subjective:Deloris C Vierra is a 68 y.o.  Pt presents for an OV for Chronic Conditions/illness Management  Diabetes with complications/overweight:  Patient reports he has been a diabetic since 1998.   He reports with compliance w/  Janumet/Metformin 1000-100 mg daily, Farxiga 10 mg daily and Lantus 70 units daily. Patient denies dizziness, hyperglycemic or hypoglycemic events. Patient denies numbness, tingling in the extremities or nonhealing wounds of feet.    Depression/anxiety/insomnia: Patient reports he is feeling well, and sleeping well with Valium 4 times daily and trazodone 100 mg nightly.   Prior note He had been on BuSpar as well which he originally thought was helpful, but then he felt that it was not as helpful.   [place] controlled substance database reviewed today Original note: He states he has a rather routine day.  He does not have much social contact in a day.  He lives by himself.  He reports that he always feels like he "stays on edge.  "He reports when he wakes up he is already feeling on edge and he will take a Valium at that time.  He then has breakfast and goes on a 4 mile walk daily.  For lunch he starts to feel his anxiety sudden and he becomes panicked.  He states when he feels this way he will break out in a sweat, become very fearful and scared.  He states he commonly fears there is someone in the house with a gun.  He goes to his bedroom gets in his bed. He states the fear is so intense and he just cannot control it.  He denies any known trauma or event experienced in his life  that would create such a fear.  He states he did go see a counselor/psychiatrist at [facility] in the past 6 months, and he did not feel that was helpful for him.  Denies any SI or HI.  Hypertension/CAD/S/p Stent/prior MI:  Pt has a significant cardiac history of "anterior MI in 2004 with stents was LAD and then stents to branch vessel 2008 (by DR. Renaldo-cardio note 2015)"  Patient reports compliance with lisinopril 20 mg daily,, Lovaza, and Crestor.  His statin was increased by cardiology to Crestor 40 mg daily.  Patient denies chest pain, shortness of breath, dizziness or lower extremity edema.  .   Diet: Low-sodium Exercise: Routine exercise daily RF: Hypertension, hyperlipidemia, diabetes, overweight, family history, CAD, MI   Neck pain/back pain: pt reports back pain is about the same. The naproxen twice daily as needed is helpful, especially during tractor season Prior note: Patient has had recurrent posterior neck pain and headache.  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  He states he found an old prescription of naproxen and restarted it the last time he had symptoms.  He states it worked well for him.  He is wondering if we could get refills for him today.  In the past he has stated he could not tolerate NSAIDs.     Erectile  dysfunction: (Documentation only) patient would like to start a medication for erectile dysfunction.  He states he seen a urologist many years ago and does not desire to see a urologist again.  Patient has a significant medical history of blood pressure with systolic less than 100 and history of CAD and MI.  Echo 06/08/2021: EF 50-55%.  LVH.  Moderate calcification of aortic valve.  echo 03/06/2020:  1. Technically difficult study. Left ventricular ejection fraction, by  estimation, is 45 to 50%. The left ventricle has grossly mildly decreased  function. Image quality is not sufficient to assess for regional wall   motion abnormalities. Left ventricular   diastolic parameters are indeterminate.   2. Right ventricle is poorly visualized. Grossly normal size and systolic  function. Tricuspid regurgitation signal is inadequate for assessing PA  pressure.   3. The mitral valve is normal in structure. No evidence of mitral valve  regurgitation.   4. Aortic dilatation noted. There is mild dilatation of the ascending  aorta measuring 37 mm.   5. The aortic valve is abnormal. Moderately calcified. Aortic valve  regurgitation is not visualized. Mild aortic valve stenosis. Vmax 2.2 m/s,  MG 10 mmHg, AVA 1.7 cm^2, DI 0.4      02/27/2024    1:46 PM 08/21/2023    1:04 PM 03/24/2023    1:35 PM 12/09/2022    1:43 PM 12/05/2022    9:54 AM  Depression screen PHQ 2/9  Decreased Interest 0 0 0 2 1  Down, Depressed, Hopeless 0 0 0 0 0  PHQ - 2 Score 0 0 0 2 1  Altered sleeping 0   0   Tired, decreased energy 2   2   Change in appetite 2   3   Feeling bad or failure about yourself  0   0   Trouble concentrating 0   2   Moving slowly or fidgety/restless 0   0   Suicidal thoughts 0   0   PHQ-9 Score 4   9   Difficult doing work/chores Not difficult at all        Allergies  Allergen Reactions   Gabapentin Other (See Comments)    Paranoia/hallucinations   Hydrocodone Other (See Comments)    PT reports  Confusion when tacking Hydrocodone   Nsaids     History of heart disease, CAD with history of MI, CABG status post stent   Prednisone     Paranoia   Tramadol    Penicillins Other (See Comments)    Childhood unsure if still allgeric   Social History   Social History Narrative   Marital status/children/pets: Single   Education/employment: HS grad, 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:      -smoke alarm in the home:Yes     - wears seatbelt: Yes      Past Medical History:  Diagnosis Date   Alcohol addiction (HCC)    Anxiety    Arthritis    Bulging of lumbar intervertebral disc 2017   MRI: L3-4, L4-5  and L5-S1- facet hypertrophy and formainal stenosis   CAD (coronary artery disease)    Coronary artery disease with hx of myocardial infarct w/o hx of CABG 2004   Depression    Dextrocardia    Diabetes (HCC)    Heart attack (HCC) 2004   Stent placement 2008   Hyperlipidemia    Hypertension    Insomnia    Past Surgical History:  Procedure Laterality Date   CORONARY ANGIOPLASTY WITH  STENT PLACEMENT  2008   Family History  Problem Relation Age of Onset   Heart attack Mother    Heart disease Mother    Diabetes Mother    Arthritis Mother    Hyperlipidemia Mother    Hypertension Mother    Breast cancer Mother    Heart attack Father    Heart disease Father    Alcohol abuse Father    Early death Father    Arthritis Sister    Heart disease Sister    Hyperlipidemia Sister    Asthma Sister    COPD Sister    Diabetes Sister    Alcohol abuse Sister    Allergies as of 02/27/2024       Reactions   Gabapentin Other (See Comments)   Paranoia/hallucinations   Hydrocodone Other (See Comments)   PT reports  Confusion when tacking Hydrocodone   Nsaids    History of heart disease, CAD with history of MI, CABG status post stent   Prednisone    Paranoia   Tramadol    Penicillins Other (See Comments)   Childhood unsure if still allgeric        Medication List        Accurate as of February 27, 2024  2:10 PM. If you have any questions, ask your nurse or doctor.          Accu-Chek FastClix Lancets Misc   aspirin EC 81 MG tablet Take 1 tablet (81 mg total) by mouth daily.   dapagliflozin propanediol 10 MG Tabs tablet Commonly known as: Farxiga TAKE 1 TABLET BY MOUTH  DAILY BEFORE BREAKFAST   diazepam 5 MG tablet Commonly known as: VALIUM Take 1 tablet (5 mg total) by mouth every 6 (six) hours as needed for anxiety.   Janumet XR [phone] MG Tb24 Generic drug: SitaGLIPtin-MetFORMIN HCl Take 1 tablet by mouth daily.   Lantus SoloStar 100 UNIT/ML Solostar Pen Generic  drug: insulin glargine Inject 70 Units into the skin daily.   lisinopril 20 MG tablet Commonly known as: ZESTRIL Take 1 tablet (20 mg total) by mouth daily.   naproxen 500 MG tablet Commonly known as: NAPROSYN TAKE 1 TABLET BY MOUTH EVERY 12 HOURS W/FOOD AS NEEDED ONLY *NEW PRESCRIPTION REQUEST*   omega-3 acid ethyl esters 1 g capsule Commonly known as: LOVAZA Take 1 capsule (1 g total) by mouth daily.   OneTouch Verio test strip Generic drug: glucose blood USE AS DIRECTED *NEW PRESCRIPTION REQUEST*   rosuvastatin 40 MG tablet Commonly known as: CRESTOR Take 1 tablet (40 mg total) by mouth at bedtime.   traZODone 100 MG tablet Commonly known as: DESYREL Take 1 tablet (100 mg total) by mouth at bedtime as needed for sleep.   UltiCare Pen Needles 31G X 5 MM Misc Generic drug: Insulin Pen Needle USE TO INJECT PEN INSULIN *NEW PRESCRIPTION REQUEST*        All past medical history, surgical history, allergies, family history, immunizations andmedications were updated in the EMR today and reviewed under the history and medication portions of their EMR.     ROS: Negative, with the exception of above mentioned in HPI   Objective:  BP 118/68   Pulse 61   Temp 98 F (36.7 C)   Wt 215 lb (97.5 kg)   SpO2 96%   BMI 29.99 kg/m  Body mass index is 29.99 kg/m. Physical Exam Vitals and nursing note reviewed.  Constitutional:      General: He is not in acute distress.  Appearance: Normal appearance. He is not ill-appearing, toxic-appearing or diaphoretic.  HENT:     Head: Normocephalic and atraumatic.  Eyes:     General: No scleral icterus.       Right eye: No discharge.        Left eye: No discharge.     Extraocular Movements: Extraocular movements intact.     Pupils: Pupils are equal, round, and reactive to light.  Cardiovascular:     Rate and Rhythm: Regular rhythm. Bradycardia present.     Heart sounds: Murmur heard.  Pulmonary:     Effort: Pulmonary effort is  normal. No respiratory distress.     Breath sounds: Normal breath sounds. No wheezing, rhonchi or rales.  Musculoskeletal:     Right lower leg: No edema.     Left lower leg: No edema.  Skin:    General: Skin is warm.     Findings: No rash.  Neurological:     Mental Status: He is alert and oriented to person, place, and time. Mental status is at baseline.  Psychiatric:        Mood and Affect: Mood normal.        Behavior: Behavior normal.        Thought Content: Thought content normal.        Judgment: Judgment normal.    No results found. No results found. Results for orders placed or performed in visit on 02/27/24 (from the past 24 hours)  POCT HgB A1C     Status: Abnormal   Collection Time: 02/27/24  1:50 PM  Result Value Ref Range   Hemoglobin A1C 7.2 (A) 4.0 - 5.6 %   HbA1c POC (<> result, manual entry) 7.2 4.0 - 5.6 %   HbA1c, POC (prediabetic range) 7.2 (A) 5.7 - 6.4 %   HbA1c, POC (controlled diabetic range) 7.2 (A) 0.0 - 7.0 %      Assessment/Plan: GAHEL SAFLEY is a 68 y.o. male present for OV for Chronic Conditions/illness Management Dyslipidemia associated with type 2 diabetes mellitus (HCC)/diabetic peripheral neuropathy/mild nonproliferative diabetic retinopathy of both eyes/overweight:  Stable Continue janumet [phone] daily Continue farixga 10 mg daily Continue Lantus 70 units PNA series: Completed after 63 Microalbumin collected today Flu shot: UTD 2024 Foot exam: UTD 02/15/2024> podiatry est-had callus removed last week Eye exam:  Completed UTD 07/13/2022- eye clinic-[facility] A1c:10.4>>>9.4>>  9.7>>11.8>> 8.5 >>> 6.5 >>>7.1>7.6>7.7>8.7> 8.8 on 04/20/2021>8.3 > 8.7 > 7.4 > 7.6> 8.2 > 6.8> 7.0>9.0> 7.2 today   Anxiety/depression/insomnia: Stable Has a history of significant depression with anxiety and paranoid features. Continue Valium 4 times daily Continue trazodone to 100 mg daily Meds tried in the past: BuSpar patient reports it was not effective.  Paxil  patient reports made him more anxious. -  [place] controlled substance database reviewed today  Patient aware he needs a face-to-face appointment in order for any refills to be processed -Contract signed -Offered referral to counseling or psychiatry; he declined.    Hypertension/CAD/S/p Stent/prior MI/hypercholesteremia/aortic stenosis/aortic dilatation/LVH/bradycardia:  Pt has a significant cardiac history of "anterior MI in 2004 with stents was LAD and then stents to a branch vessel 2008 (by DR. Renaldo- cardio note 2015)" Continue lisinopril to 20 mg.  Continue aspirin 81 mg daily.   Cardiology discontinued Plavix  Continue Crestor 40 mg daily Diet: Low-sodium Exercise: Routine exercise daily - REFERRED BACK to cardio, he has not returned in a few years. He desires transfer to [facility] for transportation convenience.  This was  approved by cardiology, however I do not see where they called patient back to schedule.  Encourage patient to call them again.  Reviewed expectations re: course of current medical issues. Discussed self-management of symptoms. Outlined signs and symptoms indicating need for more acute intervention. Patient verbalized understanding and all questions were answered. Patient received an After-Visit Summary.    Orders Placed This Encounter  Procedures   Urine Microalbumin w/creat. ratio   POCT HgB A1C    Meds ordered this encounter  Medications   dapagliflozin propanediol (FARXIGA) 10 MG TABS tablet    Sig: TAKE 1 TABLET BY MOUTH  DAILY BEFORE BREAKFAST    Dispense:  90 tablet    Refill:  1    Requesting 1 year supply> no   diazepam (VALIUM) 5 MG tablet    Sig: Take 1 tablet (5 mg total) by mouth every 6 (six) hours as needed for anxiety.    Dispense:  120 tablet    Refill:  5   JANUMET XR [phone] MG TB24    Sig: Take 1 tablet by mouth daily.    Dispense:  90 tablet    Refill:  1   lisinopril (ZESTRIL) 20 MG tablet    Sig: Take 1 tablet (20  mg total) by mouth daily.    Dispense:  90 tablet    Refill:  1   traZODone (DESYREL) 100 MG tablet    Sig: Take 1 tablet (100 mg total) by mouth at bedtime as needed for sleep.    Dispense:  90 tablet    Refill:  1   rosuvastatin (CRESTOR) 40 MG tablet    Sig: Take 1 tablet (40 mg total) by mouth at bedtime.    Dispense:  90 tablet    Refill:  1   insulin glargine (LANTUS SOLOSTAR) 100 UNIT/ML Solostar Pen    Sig: Inject 70 Units into the skin daily.    Dispense:  60 mL    Refill:  5    USE THIS SCRIPT PLEASE!!! DC ALL THE other scripts please.     Referral Orders  No referral(s) requested today      Note is dictated utilizing voice recognition software. Although note has been proof read prior to signing, occasional typographical errors still can be missed. If any questions arise, please do not hesitate to call for verification.   electronically signed by:  Felix Pacini, DO  [facility] [place]

## 2024-02-21 ENCOUNTER — Telehealth: Payer: Self-pay | Admitting: *Deleted

## 2024-02-21 ENCOUNTER — Telehealth: Payer: Self-pay

## 2024-02-21 DIAGNOSIS — E1142 Type 2 diabetes mellitus with diabetic polyneuropathy: Secondary | ICD-10-CM | POA: Diagnosis not present

## 2024-02-21 DIAGNOSIS — E785 Hyperlipidemia, unspecified: Secondary | ICD-10-CM | POA: Diagnosis not present

## 2024-02-21 DIAGNOSIS — F1020 Alcohol dependence, uncomplicated: Secondary | ICD-10-CM

## 2024-02-21 DIAGNOSIS — M549 Dorsalgia, unspecified: Secondary | ICD-10-CM | POA: Diagnosis not present

## 2024-02-21 DIAGNOSIS — L720 Epidermal cyst: Secondary | ICD-10-CM | POA: Diagnosis not present

## 2024-02-21 DIAGNOSIS — Z794 Long term (current) use of insulin: Secondary | ICD-10-CM | POA: Diagnosis not present

## 2024-02-21 DIAGNOSIS — E113299 Type 2 diabetes mellitus with mild nonproliferative diabetic retinopathy without macular edema, unspecified eye: Secondary | ICD-10-CM | POA: Diagnosis not present

## 2024-02-21 DIAGNOSIS — I77810 Thoracic aortic ectasia: Secondary | ICD-10-CM | POA: Diagnosis not present

## 2024-02-21 DIAGNOSIS — F411 Generalized anxiety disorder: Secondary | ICD-10-CM

## 2024-02-21 DIAGNOSIS — G8929 Other chronic pain: Secondary | ICD-10-CM | POA: Diagnosis not present

## 2024-02-21 DIAGNOSIS — L02212 Cutaneous abscess of back [any part, except buttock]: Secondary | ICD-10-CM | POA: Diagnosis not present

## 2024-02-21 DIAGNOSIS — E1159 Type 2 diabetes mellitus with other circulatory complications: Secondary | ICD-10-CM | POA: Diagnosis not present

## 2024-02-21 NOTE — Telephone Encounter (Signed)
 Home health orders received 02/21/24 for [name] health initiation orders: Yes.  Home health re-certification orders: No. Patient last seen by ordering physician for this condition: 01/08/24. Must be less than 90 days for re-certification and less than 30 days prior for initiation. Visit must have been for the condition the orders are being placed.  Patient meets criteria for Physician to sign orders: Yes.        Current med list has been attached: Yes        Orders placed on physicians desk for signature: 02/21/24 (date) If patient does not meet criteria for orders to be signed: pt was called to schedule appt. Appt is scheduled for 02/27/24.   Pollie Meyer

## 2024-02-21 NOTE — Telephone Encounter (Signed)
Completed and returned to [place] work station

## 2024-02-21 NOTE — Telephone Encounter (Signed)
 Forms faxed

## 2024-02-21 NOTE — Progress Notes (Signed)
 Complex Care Management Care Guide Note  02/21/2024 Name: RAYE SLYTER MRN: [id number] DOB: 06-28-1956  [name] is a 68 y.o. year old male who is a primary care patient of Claiborne Billings, Renee A, DO and is actively engaged with the care management team. I reached out to [name] by phone today to assist with re-scheduling  with the RN Case Manager.  Follow up plan: Unsuccessful telephone outreach attempt made. A HIPAA compliant phone message was left for the patient providing contact information and requesting a return call.  Gwenevere Ghazi  [facility]  Value-Based Care Institute, [facility] Guide  Direct Dial: [phone]  Fax [phone]

## 2024-02-23 DIAGNOSIS — I2510 Atherosclerotic heart disease of native coronary artery without angina pectoris: Secondary | ICD-10-CM | POA: Diagnosis not present

## 2024-02-23 DIAGNOSIS — L02212 Cutaneous abscess of back [any part, except buttock]: Secondary | ICD-10-CM | POA: Diagnosis not present

## 2024-02-23 DIAGNOSIS — Z794 Long term (current) use of insulin: Secondary | ICD-10-CM | POA: Diagnosis not present

## 2024-02-23 DIAGNOSIS — E1169 Type 2 diabetes mellitus with other specified complication: Secondary | ICD-10-CM | POA: Diagnosis not present

## 2024-02-23 DIAGNOSIS — G8929 Other chronic pain: Secondary | ICD-10-CM | POA: Diagnosis not present

## 2024-02-23 DIAGNOSIS — I350 Nonrheumatic aortic (valve) stenosis: Secondary | ICD-10-CM | POA: Diagnosis not present

## 2024-02-23 DIAGNOSIS — M47817 Spondylosis without myelopathy or radiculopathy, lumbosacral region: Secondary | ICD-10-CM | POA: Diagnosis not present

## 2024-02-23 DIAGNOSIS — E1142 Type 2 diabetes mellitus with diabetic polyneuropathy: Secondary | ICD-10-CM | POA: Diagnosis not present

## 2024-02-23 DIAGNOSIS — E785 Hyperlipidemia, unspecified: Secondary | ICD-10-CM | POA: Diagnosis not present

## 2024-02-23 DIAGNOSIS — Z7982 Long term (current) use of aspirin: Secondary | ICD-10-CM | POA: Diagnosis not present

## 2024-02-23 DIAGNOSIS — Z7984 Long term (current) use of oral hypoglycemic drugs: Secondary | ICD-10-CM | POA: Diagnosis not present

## 2024-02-23 DIAGNOSIS — L720 Epidermal cyst: Secondary | ICD-10-CM | POA: Diagnosis not present

## 2024-02-23 DIAGNOSIS — I77810 Thoracic aortic ectasia: Secondary | ICD-10-CM | POA: Diagnosis not present

## 2024-02-23 DIAGNOSIS — G4700 Insomnia, unspecified: Secondary | ICD-10-CM | POA: Diagnosis not present

## 2024-02-23 DIAGNOSIS — I119 Hypertensive heart disease without heart failure: Secondary | ICD-10-CM | POA: Diagnosis not present

## 2024-02-23 DIAGNOSIS — M549 Dorsalgia, unspecified: Secondary | ICD-10-CM | POA: Diagnosis not present

## 2024-02-23 DIAGNOSIS — M51369 Other intervertebral disc degeneration, lumbar region without mention of lumbar back pain or lower extremity pain: Secondary | ICD-10-CM | POA: Diagnosis not present

## 2024-02-23 DIAGNOSIS — E113299 Type 2 diabetes mellitus with mild nonproliferative diabetic retinopathy without macular edema, unspecified eye: Secondary | ICD-10-CM | POA: Diagnosis not present

## 2024-02-26 ENCOUNTER — Ambulatory Visit (INDEPENDENT_AMBULATORY_CARE_PROVIDER_SITE_OTHER): Admitting: Podiatry

## 2024-02-26 DIAGNOSIS — B351 Tinea unguium: Secondary | ICD-10-CM

## 2024-02-26 DIAGNOSIS — M2061 Acquired deformities of toe(s), unspecified, right foot: Secondary | ICD-10-CM | POA: Diagnosis not present

## 2024-02-26 DIAGNOSIS — L84 Corns and callosities: Secondary | ICD-10-CM | POA: Diagnosis not present

## 2024-02-26 DIAGNOSIS — M79674 Pain in right toe(s): Secondary | ICD-10-CM

## 2024-02-26 NOTE — Progress Notes (Unsigned)
 Subjective:  Patient ID: [name], male    DOB: 1956/01/31,  MRN: [id number]  [name] presents to clinic today for:  Chief Complaint  Patient presents with   Callouses    Right great toe    Patient presents with concern of a painful callus on the right great toe.  States he had a prior lawnmower injury around 30 years ago and had to have some of the toe removed.  The callus has only been present for the past 6 months though.  He also notes nails are thick, discolored, elongated and painful in shoegear when trying to ambulate.  This is on the right foot  PCP is Kuneff, Renee A, DO.  Past Medical History:  Diagnosis Date   Alcohol addiction (HCC)    Anxiety    Arthritis    Bulging of lumbar intervertebral disc 2017   MRI: L3-4, L4-5 and L5-S1- facet hypertrophy and formainal stenosis   CAD (coronary artery disease)    Coronary artery disease with hx of myocardial infarct w/o hx of CABG 2004   Depression    Dextrocardia    Diabetes (HCC)    Heart attack (HCC) 2004   Stent placement 2008   Hyperlipidemia    Hypertension    Insomnia     Past Surgical History:  Procedure Laterality Date   CORONARY ANGIOPLASTY WITH STENT PLACEMENT  2008    Allergies  Allergen Reactions   Gabapentin Other (See Comments)    Paranoia/hallucinations   Hydrocodone Other (See Comments)    PT reports  Confusion when tacking Hydrocodone   Nsaids     History of heart disease, CAD with history of MI, CABG status post stent   Prednisone     Paranoia   Tramadol    Penicillins Other (See Comments)    Childhood unsure if still allgeric    Review of Systems: Negative except as noted in the HPI.  Objective:  [name] is a pleasant 68 y.o. male in NAD. AAO x 3.  Patient did not remove his left shoe  Vascular Examination: Capillary refill time is 3-5 seconds to toes right. Palpable pedal pulses right LE. Digital hair present right.  Skin temperature gradient WNL right foot.     Dermatological Examination: Pedal skin with normal turgor, texture and tone right foot. No open wounds. No interdigital macerations right foot.  Toenails x 5 right foot are 3mm thick, discolored, dystrophic with subungual debris. There is pain with compression of the nail plates.  They are elongated x 5.  There is a hyperkeratotic lesion on the plantar medial aspect of the right hallux.  No ulceration is noted.  No blister formation is seen  Neurological Examination: Epicritic sensation intact     Latest Ref Rng & Units 02/27/2024    1:50 PM 11/06/2023    2:00 PM 07/14/2023    2:01 PM 03/24/2023    1:53 PM  Hemoglobin A1C  Hemoglobin-A1c 5.7 - 6.4 % 0.0 - 7.0 % 4.0 - 5.6 % 4.0 - 5.6 % 7.2    7.2    7.2    7.2  9.0  7.0    7.0    7.0    7.0  6.8    6.8    6.8    6.8    Assessment/Plan: 1. Callus of foot   2. Dermatophytosis of nail   3. Pain in right toe(s)   4. Acquired deformity of toe,  right     The hyperkeratotic lesion on the plantar lateral aspect of the right hallux was shaved with a sterile #313 blade uneventfully.  This may recur due to the deformity of the toe from his previous lawnmower injury causing slight deformity of the toe.  The mycotic toenails were sharply debrided x 5 with sterile nail nippers and a power debriding burr to decrease bulk/thickness and length.    Return if symptoms worsen or fail to improve.   Clerance Lav, DPM, FACFAS [facility]     [address] [place], [place] [address]                Office [phone]  Fax [phone]

## 2024-02-27 ENCOUNTER — Encounter: Payer: Self-pay | Admitting: Family Medicine

## 2024-02-27 ENCOUNTER — Other Ambulatory Visit: Payer: Self-pay | Admitting: *Deleted

## 2024-02-27 ENCOUNTER — Ambulatory Visit (INDEPENDENT_AMBULATORY_CARE_PROVIDER_SITE_OTHER): Admitting: Family Medicine

## 2024-02-27 VITALS — BP 118/68 | HR 61 | Temp 98.0°F | Wt 215.0 lb

## 2024-02-27 DIAGNOSIS — G4700 Insomnia, unspecified: Secondary | ICD-10-CM | POA: Diagnosis not present

## 2024-02-27 DIAGNOSIS — E785 Hyperlipidemia, unspecified: Secondary | ICD-10-CM | POA: Diagnosis not present

## 2024-02-27 DIAGNOSIS — E1169 Type 2 diabetes mellitus with other specified complication: Secondary | ICD-10-CM

## 2024-02-27 DIAGNOSIS — Z794 Long term (current) use of insulin: Secondary | ICD-10-CM | POA: Diagnosis not present

## 2024-02-27 DIAGNOSIS — Z7982 Long term (current) use of aspirin: Secondary | ICD-10-CM | POA: Diagnosis not present

## 2024-02-27 DIAGNOSIS — I77810 Thoracic aortic ectasia: Secondary | ICD-10-CM

## 2024-02-27 DIAGNOSIS — M549 Dorsalgia, unspecified: Secondary | ICD-10-CM | POA: Diagnosis not present

## 2024-02-27 DIAGNOSIS — E113299 Type 2 diabetes mellitus with mild nonproliferative diabetic retinopathy without macular edema, unspecified eye: Secondary | ICD-10-CM | POA: Diagnosis not present

## 2024-02-27 DIAGNOSIS — M48061 Spinal stenosis, lumbar region without neurogenic claudication: Secondary | ICD-10-CM

## 2024-02-27 DIAGNOSIS — I119 Hypertensive heart disease without heart failure: Secondary | ICD-10-CM | POA: Diagnosis not present

## 2024-02-27 DIAGNOSIS — L02212 Cutaneous abscess of back [any part, except buttock]: Secondary | ICD-10-CM | POA: Diagnosis not present

## 2024-02-27 DIAGNOSIS — I10 Essential (primary) hypertension: Secondary | ICD-10-CM | POA: Diagnosis not present

## 2024-02-27 DIAGNOSIS — L720 Epidermal cyst: Secondary | ICD-10-CM | POA: Diagnosis not present

## 2024-02-27 DIAGNOSIS — I350 Nonrheumatic aortic (valve) stenosis: Secondary | ICD-10-CM | POA: Diagnosis not present

## 2024-02-27 DIAGNOSIS — M51369 Other intervertebral disc degeneration, lumbar region without mention of lumbar back pain or lower extremity pain: Secondary | ICD-10-CM

## 2024-02-27 DIAGNOSIS — G8929 Other chronic pain: Secondary | ICD-10-CM | POA: Diagnosis not present

## 2024-02-27 DIAGNOSIS — R809 Proteinuria, unspecified: Secondary | ICD-10-CM

## 2024-02-27 DIAGNOSIS — E113293 Type 2 diabetes mellitus with mild nonproliferative diabetic retinopathy without macular edema, bilateral: Secondary | ICD-10-CM | POA: Diagnosis not present

## 2024-02-27 DIAGNOSIS — Z7984 Long term (current) use of oral hypoglycemic drugs: Secondary | ICD-10-CM

## 2024-02-27 DIAGNOSIS — M47817 Spondylosis without myelopathy or radiculopathy, lumbosacral region: Secondary | ICD-10-CM

## 2024-02-27 DIAGNOSIS — I2510 Atherosclerotic heart disease of native coronary artery without angina pectoris: Secondary | ICD-10-CM | POA: Diagnosis not present

## 2024-02-27 DIAGNOSIS — E1142 Type 2 diabetes mellitus with diabetic polyneuropathy: Secondary | ICD-10-CM | POA: Diagnosis not present

## 2024-02-27 LAB — POCT GLYCOSYLATED HEMOGLOBIN (HGB A1C)
HbA1c POC (<> result, manual entry): 7.2 % (ref 4.0–5.6)
HbA1c, POC (controlled diabetic range): 7.2 % — AB (ref 0.0–7.0)
HbA1c, POC (prediabetic range): 7.2 % — AB (ref 5.7–6.4)
Hemoglobin A1C: 7.2 % — AB (ref 4.0–5.6)

## 2024-02-27 LAB — MICROALBUMIN / CREATININE URINE RATIO
Creatinine,U: 35.9 mg/dL
Microalb Creat Ratio: UNDETERMINED mg/g (ref 0.0–30.0)
Microalb, Ur: <0.7 mg/dL

## 2024-02-27 MED ORDER — LANTUS SOLOSTAR 100 UNIT/ML ~~LOC~~ SOPN: 70.0000 [IU] | PEN_INJECTOR | Freq: Every day | SUBCUTANEOUS | 5 refills | Status: DC

## 2024-02-27 MED ORDER — ROSUVASTATIN CALCIUM 40 MG PO TABS: 40.0000 mg | ORAL_TABLET | Freq: Every day | ORAL | 1 refills | Status: DC

## 2024-02-27 MED ORDER — LISINOPRIL 20 MG PO TABS: 20.0000 mg | ORAL_TABLET | Freq: Every day | ORAL | 1 refills | Status: DC

## 2024-02-27 MED ORDER — DIAZEPAM 5 MG PO TABS: 5.0000 mg | ORAL_TABLET | Freq: Four times a day (QID) | ORAL | 5 refills | Status: DC | PRN

## 2024-02-27 MED ORDER — TRAZODONE HCL 100 MG PO TABS: 100.0000 mg | ORAL_TABLET | Freq: Every evening | ORAL | 1 refills | Status: DC | PRN

## 2024-02-27 MED ORDER — JANUMET XR 100-1000 MG PO TB24: 1.0000 | ORAL_TABLET | Freq: Every day | ORAL | 1 refills | Status: DC

## 2024-02-27 MED ORDER — DAPAGLIFLOZIN PROPANEDIOL 10 MG PO TABS
ORAL_TABLET | ORAL | 1 refills | Status: DC
Start: 2024-02-27 — End: 2024-07-02

## 2024-02-27 NOTE — Patient Instructions (Addendum)
 Return in about 15 weeks (around 06/11/2024).  A1c is looking good at 7.2. Keep up the good work.       Great to see you today.  I have refilled the medication(s) we provide.   If labs were collected or images ordered, we will inform you of  results once we have received them and reviewed. We will contact you either by echart message, or telephone call.  Please give ample time to the testing facility, and our office to run,  receive and review results. Please do not call inquiring of results, even if you can see them in your chart. We will contact you as soon as we are able. If it has been over 1 week since the test was completed, and you have not yet heard from [place], then please call us.    - echart message- for normal results that have been seen by the patient already.   - telephone call: abnormal results or if patient has not viewed results in their echart.  If a referral to a specialist was entered for you, please call us in 2 weeks if you have not heard from the specialist office to schedule.

## 2024-02-28 ENCOUNTER — Encounter: Payer: Self-pay | Admitting: Family Medicine

## 2024-02-29 ENCOUNTER — Telehealth: Payer: Self-pay | Admitting: Family Medicine

## 2024-02-29 NOTE — Telephone Encounter (Signed)
 I have received home health orders-Home health re-certification and plan of care for the certification.  3/23 - 04/17/2024. Home health orders are surrounding the care of his back abscess.  Patient has been seen within the last 90 days, signed forms and placed in CMA work basket

## 2024-03-01 DIAGNOSIS — E1142 Type 2 diabetes mellitus with diabetic polyneuropathy: Secondary | ICD-10-CM | POA: Diagnosis not present

## 2024-03-01 DIAGNOSIS — M549 Dorsalgia, unspecified: Secondary | ICD-10-CM | POA: Diagnosis not present

## 2024-03-01 DIAGNOSIS — L720 Epidermal cyst: Secondary | ICD-10-CM | POA: Diagnosis not present

## 2024-03-01 DIAGNOSIS — G8929 Other chronic pain: Secondary | ICD-10-CM | POA: Diagnosis not present

## 2024-03-01 DIAGNOSIS — I77810 Thoracic aortic ectasia: Secondary | ICD-10-CM | POA: Diagnosis not present

## 2024-03-01 DIAGNOSIS — E113299 Type 2 diabetes mellitus with mild nonproliferative diabetic retinopathy without macular edema, unspecified eye: Secondary | ICD-10-CM | POA: Diagnosis not present

## 2024-03-01 DIAGNOSIS — E785 Hyperlipidemia, unspecified: Secondary | ICD-10-CM | POA: Diagnosis not present

## 2024-03-01 DIAGNOSIS — Z7982 Long term (current) use of aspirin: Secondary | ICD-10-CM | POA: Diagnosis not present

## 2024-03-01 DIAGNOSIS — I2510 Atherosclerotic heart disease of native coronary artery without angina pectoris: Secondary | ICD-10-CM | POA: Diagnosis not present

## 2024-03-01 DIAGNOSIS — Z7984 Long term (current) use of oral hypoglycemic drugs: Secondary | ICD-10-CM | POA: Diagnosis not present

## 2024-03-01 DIAGNOSIS — E1169 Type 2 diabetes mellitus with other specified complication: Secondary | ICD-10-CM | POA: Diagnosis not present

## 2024-03-01 DIAGNOSIS — G4700 Insomnia, unspecified: Secondary | ICD-10-CM | POA: Diagnosis not present

## 2024-03-01 DIAGNOSIS — I350 Nonrheumatic aortic (valve) stenosis: Secondary | ICD-10-CM | POA: Diagnosis not present

## 2024-03-01 DIAGNOSIS — L02212 Cutaneous abscess of back [any part, except buttock]: Secondary | ICD-10-CM | POA: Diagnosis not present

## 2024-03-01 DIAGNOSIS — Z794 Long term (current) use of insulin: Secondary | ICD-10-CM | POA: Diagnosis not present

## 2024-03-01 DIAGNOSIS — I119 Hypertensive heart disease without heart failure: Secondary | ICD-10-CM | POA: Diagnosis not present

## 2024-03-01 DIAGNOSIS — M47817 Spondylosis without myelopathy or radiculopathy, lumbosacral region: Secondary | ICD-10-CM | POA: Diagnosis not present

## 2024-03-01 DIAGNOSIS — M51369 Other intervertebral disc degeneration, lumbar region without mention of lumbar back pain or lower extremity pain: Secondary | ICD-10-CM | POA: Diagnosis not present

## 2024-03-01 NOTE — Progress Notes (Signed)
 Complex Care Management Care Guide Note  03/01/2024 Name: KYRAN CONNAUGHTON MRN: [id number] DOB: 1955/11/30  [name] is a 68 y.o. year old male who is a primary care patient of Claiborne Billings, Renee A, DO and is actively engaged with the care management team. I reached out to [name] by phone today to assist with re-scheduling  with the RN Case Manager.  Follow up plan: Unsuccessful telephone outreach attempt made. A HIPAA compliant phone message was left for the patient providing contact information and requesting a return call.No further outreach attempts will be made at this time. We have been unable to contact the patient to reschedule for complex care management services.   Gwenevere Ghazi  [facility]  Value-Based Care Institute, [facility] Guide  Direct Dial: [phone]  Fax [phone]

## 2024-03-01 NOTE — Telephone Encounter (Signed)
 Forms faxed

## 2024-03-03 DIAGNOSIS — E118 Type 2 diabetes mellitus with unspecified complications: Secondary | ICD-10-CM | POA: Diagnosis not present

## 2024-03-04 ENCOUNTER — Encounter: Payer: Self-pay | Admitting: Family Medicine

## 2024-03-05 DIAGNOSIS — G4700 Insomnia, unspecified: Secondary | ICD-10-CM | POA: Diagnosis not present

## 2024-03-05 DIAGNOSIS — I119 Hypertensive heart disease without heart failure: Secondary | ICD-10-CM | POA: Diagnosis not present

## 2024-03-05 DIAGNOSIS — I77810 Thoracic aortic ectasia: Secondary | ICD-10-CM | POA: Diagnosis not present

## 2024-03-05 DIAGNOSIS — Z7984 Long term (current) use of oral hypoglycemic drugs: Secondary | ICD-10-CM | POA: Diagnosis not present

## 2024-03-05 DIAGNOSIS — E113299 Type 2 diabetes mellitus with mild nonproliferative diabetic retinopathy without macular edema, unspecified eye: Secondary | ICD-10-CM | POA: Diagnosis not present

## 2024-03-05 DIAGNOSIS — L02212 Cutaneous abscess of back [any part, except buttock]: Secondary | ICD-10-CM | POA: Diagnosis not present

## 2024-03-05 DIAGNOSIS — E1169 Type 2 diabetes mellitus with other specified complication: Secondary | ICD-10-CM | POA: Diagnosis not present

## 2024-03-05 DIAGNOSIS — Z794 Long term (current) use of insulin: Secondary | ICD-10-CM | POA: Diagnosis not present

## 2024-03-05 DIAGNOSIS — Z7982 Long term (current) use of aspirin: Secondary | ICD-10-CM | POA: Diagnosis not present

## 2024-03-05 DIAGNOSIS — L720 Epidermal cyst: Secondary | ICD-10-CM | POA: Diagnosis not present

## 2024-03-05 DIAGNOSIS — E1142 Type 2 diabetes mellitus with diabetic polyneuropathy: Secondary | ICD-10-CM | POA: Diagnosis not present

## 2024-03-05 DIAGNOSIS — G8929 Other chronic pain: Secondary | ICD-10-CM | POA: Diagnosis not present

## 2024-03-05 DIAGNOSIS — M47817 Spondylosis without myelopathy or radiculopathy, lumbosacral region: Secondary | ICD-10-CM | POA: Diagnosis not present

## 2024-03-05 DIAGNOSIS — I350 Nonrheumatic aortic (valve) stenosis: Secondary | ICD-10-CM | POA: Diagnosis not present

## 2024-03-05 DIAGNOSIS — E785 Hyperlipidemia, unspecified: Secondary | ICD-10-CM | POA: Diagnosis not present

## 2024-03-05 DIAGNOSIS — I2510 Atherosclerotic heart disease of native coronary artery without angina pectoris: Secondary | ICD-10-CM | POA: Diagnosis not present

## 2024-03-05 DIAGNOSIS — M549 Dorsalgia, unspecified: Secondary | ICD-10-CM | POA: Diagnosis not present

## 2024-03-05 DIAGNOSIS — M51369 Other intervertebral disc degeneration, lumbar region without mention of lumbar back pain or lower extremity pain: Secondary | ICD-10-CM | POA: Diagnosis not present

## 2024-03-08 DIAGNOSIS — I77810 Thoracic aortic ectasia: Secondary | ICD-10-CM | POA: Diagnosis not present

## 2024-03-08 DIAGNOSIS — L720 Epidermal cyst: Secondary | ICD-10-CM | POA: Diagnosis not present

## 2024-03-08 DIAGNOSIS — M47817 Spondylosis without myelopathy or radiculopathy, lumbosacral region: Secondary | ICD-10-CM | POA: Diagnosis not present

## 2024-03-08 DIAGNOSIS — Z794 Long term (current) use of insulin: Secondary | ICD-10-CM | POA: Diagnosis not present

## 2024-03-08 DIAGNOSIS — I2510 Atherosclerotic heart disease of native coronary artery without angina pectoris: Secondary | ICD-10-CM | POA: Diagnosis not present

## 2024-03-08 DIAGNOSIS — Z7982 Long term (current) use of aspirin: Secondary | ICD-10-CM | POA: Diagnosis not present

## 2024-03-08 DIAGNOSIS — E113299 Type 2 diabetes mellitus with mild nonproliferative diabetic retinopathy without macular edema, unspecified eye: Secondary | ICD-10-CM | POA: Diagnosis not present

## 2024-03-08 DIAGNOSIS — I350 Nonrheumatic aortic (valve) stenosis: Secondary | ICD-10-CM | POA: Diagnosis not present

## 2024-03-08 DIAGNOSIS — E1169 Type 2 diabetes mellitus with other specified complication: Secondary | ICD-10-CM | POA: Diagnosis not present

## 2024-03-08 DIAGNOSIS — E1142 Type 2 diabetes mellitus with diabetic polyneuropathy: Secondary | ICD-10-CM | POA: Diagnosis not present

## 2024-03-08 DIAGNOSIS — G8929 Other chronic pain: Secondary | ICD-10-CM | POA: Diagnosis not present

## 2024-03-08 DIAGNOSIS — I119 Hypertensive heart disease without heart failure: Secondary | ICD-10-CM | POA: Diagnosis not present

## 2024-03-08 DIAGNOSIS — E785 Hyperlipidemia, unspecified: Secondary | ICD-10-CM | POA: Diagnosis not present

## 2024-03-08 DIAGNOSIS — M51369 Other intervertebral disc degeneration, lumbar region without mention of lumbar back pain or lower extremity pain: Secondary | ICD-10-CM | POA: Diagnosis not present

## 2024-03-08 DIAGNOSIS — G4700 Insomnia, unspecified: Secondary | ICD-10-CM | POA: Diagnosis not present

## 2024-03-08 DIAGNOSIS — Z7984 Long term (current) use of oral hypoglycemic drugs: Secondary | ICD-10-CM | POA: Diagnosis not present

## 2024-03-08 DIAGNOSIS — M549 Dorsalgia, unspecified: Secondary | ICD-10-CM | POA: Diagnosis not present

## 2024-03-08 DIAGNOSIS — L02212 Cutaneous abscess of back [any part, except buttock]: Secondary | ICD-10-CM | POA: Diagnosis not present

## 2024-03-14 DIAGNOSIS — I77810 Thoracic aortic ectasia: Secondary | ICD-10-CM | POA: Diagnosis not present

## 2024-03-14 DIAGNOSIS — E113299 Type 2 diabetes mellitus with mild nonproliferative diabetic retinopathy without macular edema, unspecified eye: Secondary | ICD-10-CM | POA: Diagnosis not present

## 2024-03-14 DIAGNOSIS — E785 Hyperlipidemia, unspecified: Secondary | ICD-10-CM | POA: Diagnosis not present

## 2024-03-14 DIAGNOSIS — G8929 Other chronic pain: Secondary | ICD-10-CM | POA: Diagnosis not present

## 2024-03-14 DIAGNOSIS — G4700 Insomnia, unspecified: Secondary | ICD-10-CM | POA: Diagnosis not present

## 2024-03-14 DIAGNOSIS — M549 Dorsalgia, unspecified: Secondary | ICD-10-CM | POA: Diagnosis not present

## 2024-03-14 DIAGNOSIS — Z7982 Long term (current) use of aspirin: Secondary | ICD-10-CM | POA: Diagnosis not present

## 2024-03-14 DIAGNOSIS — I119 Hypertensive heart disease without heart failure: Secondary | ICD-10-CM | POA: Diagnosis not present

## 2024-03-14 DIAGNOSIS — I350 Nonrheumatic aortic (valve) stenosis: Secondary | ICD-10-CM | POA: Diagnosis not present

## 2024-03-14 DIAGNOSIS — L02212 Cutaneous abscess of back [any part, except buttock]: Secondary | ICD-10-CM | POA: Diagnosis not present

## 2024-03-14 DIAGNOSIS — M51369 Other intervertebral disc degeneration, lumbar region without mention of lumbar back pain or lower extremity pain: Secondary | ICD-10-CM | POA: Diagnosis not present

## 2024-03-14 DIAGNOSIS — Z794 Long term (current) use of insulin: Secondary | ICD-10-CM | POA: Diagnosis not present

## 2024-03-14 DIAGNOSIS — I2510 Atherosclerotic heart disease of native coronary artery without angina pectoris: Secondary | ICD-10-CM | POA: Diagnosis not present

## 2024-03-14 DIAGNOSIS — L720 Epidermal cyst: Secondary | ICD-10-CM | POA: Diagnosis not present

## 2024-03-14 DIAGNOSIS — Z7984 Long term (current) use of oral hypoglycemic drugs: Secondary | ICD-10-CM | POA: Diagnosis not present

## 2024-03-14 DIAGNOSIS — M47817 Spondylosis without myelopathy or radiculopathy, lumbosacral region: Secondary | ICD-10-CM | POA: Diagnosis not present

## 2024-03-14 DIAGNOSIS — E1142 Type 2 diabetes mellitus with diabetic polyneuropathy: Secondary | ICD-10-CM | POA: Diagnosis not present

## 2024-03-14 DIAGNOSIS — E1169 Type 2 diabetes mellitus with other specified complication: Secondary | ICD-10-CM | POA: Diagnosis not present

## 2024-03-18 ENCOUNTER — Telehealth: Payer: Self-pay | Admitting: Pharmacy Technician

## 2024-03-18 ENCOUNTER — Other Ambulatory Visit (HOSPITAL_COMMUNITY): Payer: Self-pay

## 2024-03-18 DIAGNOSIS — I77810 Thoracic aortic ectasia: Secondary | ICD-10-CM | POA: Diagnosis not present

## 2024-03-18 DIAGNOSIS — L720 Epidermal cyst: Secondary | ICD-10-CM | POA: Diagnosis not present

## 2024-03-18 DIAGNOSIS — E1142 Type 2 diabetes mellitus with diabetic polyneuropathy: Secondary | ICD-10-CM | POA: Diagnosis not present

## 2024-03-18 DIAGNOSIS — G4700 Insomnia, unspecified: Secondary | ICD-10-CM | POA: Diagnosis not present

## 2024-03-18 DIAGNOSIS — M51369 Other intervertebral disc degeneration, lumbar region without mention of lumbar back pain or lower extremity pain: Secondary | ICD-10-CM | POA: Diagnosis not present

## 2024-03-18 DIAGNOSIS — E785 Hyperlipidemia, unspecified: Secondary | ICD-10-CM | POA: Diagnosis not present

## 2024-03-18 DIAGNOSIS — M549 Dorsalgia, unspecified: Secondary | ICD-10-CM | POA: Diagnosis not present

## 2024-03-18 DIAGNOSIS — M47817 Spondylosis without myelopathy or radiculopathy, lumbosacral region: Secondary | ICD-10-CM | POA: Diagnosis not present

## 2024-03-18 DIAGNOSIS — Z794 Long term (current) use of insulin: Secondary | ICD-10-CM | POA: Diagnosis not present

## 2024-03-18 DIAGNOSIS — Z7982 Long term (current) use of aspirin: Secondary | ICD-10-CM | POA: Diagnosis not present

## 2024-03-18 DIAGNOSIS — L02212 Cutaneous abscess of back [any part, except buttock]: Secondary | ICD-10-CM | POA: Diagnosis not present

## 2024-03-18 DIAGNOSIS — Z7984 Long term (current) use of oral hypoglycemic drugs: Secondary | ICD-10-CM | POA: Diagnosis not present

## 2024-03-18 DIAGNOSIS — G8929 Other chronic pain: Secondary | ICD-10-CM | POA: Diagnosis not present

## 2024-03-18 DIAGNOSIS — E113299 Type 2 diabetes mellitus with mild nonproliferative diabetic retinopathy without macular edema, unspecified eye: Secondary | ICD-10-CM | POA: Diagnosis not present

## 2024-03-18 DIAGNOSIS — E1169 Type 2 diabetes mellitus with other specified complication: Secondary | ICD-10-CM | POA: Diagnosis not present

## 2024-03-18 DIAGNOSIS — I350 Nonrheumatic aortic (valve) stenosis: Secondary | ICD-10-CM | POA: Diagnosis not present

## 2024-03-18 DIAGNOSIS — I2510 Atherosclerotic heart disease of native coronary artery without angina pectoris: Secondary | ICD-10-CM | POA: Diagnosis not present

## 2024-03-18 DIAGNOSIS — I119 Hypertensive heart disease without heart failure: Secondary | ICD-10-CM | POA: Diagnosis not present

## 2024-03-18 NOTE — Telephone Encounter (Signed)
 Pharmacy Patient Advocate Encounter   Received notification from Onbase that prior authorization for Dapagliflozin  propanediol 10mg  tablets is required/requested.   Insurance verification completed.   The patient is insured through [employer] .   Per test claim:  BRAND NAME FARXIGA  10MG  is preferred by the insurance.  If suggested medication is appropriate, Please send in a new RX and discontinue this one. If not, please advise as to why it's not appropriate so that we may request a Prior Authorization. Please note, some preferred medications may still require a PA.  If the suggested medications have not been trialed and there are no contraindications to their use, the PA will not be submitted, as it will not be approved.  Brand name filled on 03/13/2024. Next refill available 05/23/2024. No further PA needed at this time.

## 2024-03-21 DIAGNOSIS — E1169 Type 2 diabetes mellitus with other specified complication: Secondary | ICD-10-CM | POA: Diagnosis not present

## 2024-03-21 DIAGNOSIS — I2510 Atherosclerotic heart disease of native coronary artery without angina pectoris: Secondary | ICD-10-CM | POA: Diagnosis not present

## 2024-03-21 DIAGNOSIS — Z7982 Long term (current) use of aspirin: Secondary | ICD-10-CM | POA: Diagnosis not present

## 2024-03-21 DIAGNOSIS — L02212 Cutaneous abscess of back [any part, except buttock]: Secondary | ICD-10-CM | POA: Diagnosis not present

## 2024-03-21 DIAGNOSIS — L720 Epidermal cyst: Secondary | ICD-10-CM | POA: Diagnosis not present

## 2024-03-21 DIAGNOSIS — M51369 Other intervertebral disc degeneration, lumbar region without mention of lumbar back pain or lower extremity pain: Secondary | ICD-10-CM | POA: Diagnosis not present

## 2024-03-21 DIAGNOSIS — G4700 Insomnia, unspecified: Secondary | ICD-10-CM | POA: Diagnosis not present

## 2024-03-21 DIAGNOSIS — M47817 Spondylosis without myelopathy or radiculopathy, lumbosacral region: Secondary | ICD-10-CM | POA: Diagnosis not present

## 2024-03-21 DIAGNOSIS — E113299 Type 2 diabetes mellitus with mild nonproliferative diabetic retinopathy without macular edema, unspecified eye: Secondary | ICD-10-CM | POA: Diagnosis not present

## 2024-03-21 DIAGNOSIS — I119 Hypertensive heart disease without heart failure: Secondary | ICD-10-CM | POA: Diagnosis not present

## 2024-03-21 DIAGNOSIS — E1142 Type 2 diabetes mellitus with diabetic polyneuropathy: Secondary | ICD-10-CM | POA: Diagnosis not present

## 2024-03-21 DIAGNOSIS — Z7984 Long term (current) use of oral hypoglycemic drugs: Secondary | ICD-10-CM | POA: Diagnosis not present

## 2024-03-21 DIAGNOSIS — G8929 Other chronic pain: Secondary | ICD-10-CM | POA: Diagnosis not present

## 2024-03-21 DIAGNOSIS — I350 Nonrheumatic aortic (valve) stenosis: Secondary | ICD-10-CM | POA: Diagnosis not present

## 2024-03-21 DIAGNOSIS — M549 Dorsalgia, unspecified: Secondary | ICD-10-CM | POA: Diagnosis not present

## 2024-03-21 DIAGNOSIS — Z794 Long term (current) use of insulin: Secondary | ICD-10-CM | POA: Diagnosis not present

## 2024-03-21 DIAGNOSIS — E785 Hyperlipidemia, unspecified: Secondary | ICD-10-CM | POA: Diagnosis not present

## 2024-03-21 DIAGNOSIS — I77810 Thoracic aortic ectasia: Secondary | ICD-10-CM | POA: Diagnosis not present

## 2024-03-26 ENCOUNTER — Encounter: Payer: Self-pay | Admitting: Surgery

## 2024-03-26 ENCOUNTER — Ambulatory Visit (INDEPENDENT_AMBULATORY_CARE_PROVIDER_SITE_OTHER): Admitting: Surgery

## 2024-03-26 VITALS — BP 123/68 | HR 49 | Temp 97.7°F | Resp 16 | Ht 71.0 in | Wt 213.0 lb

## 2024-03-26 DIAGNOSIS — S21201D Unspecified open wound of right back wall of thorax without penetration into thoracic cavity, subsequent encounter: Secondary | ICD-10-CM | POA: Diagnosis not present

## 2024-03-26 NOTE — Progress Notes (Unsigned)
 [facility] Note   HPI:  68 y.o. Male presents to clinic for follow-up evaluation of right back wound from a previous incision and drainage of a back abscess by his primary care doctor and ED physician.  He has been doing well.  He gets the packing changed about 2 times a week by home health care nurse.  He is not able to change the dressing himself and does not have any family who can assist with dressing changes.  He denies any pain, drainage, fever, or chills.  Review of Systems:  All other review of systems: otherwise negative   Vital Signs:  BP 123/68   Pulse (!) 49   Temp 97.7 F (36.5 C) (Oral)   Resp 16   Ht 5\' 11"  (1.803 m)   Wt 213 lb (96.6 kg)   SpO2 95%   BMI 29.71 kg/m    Physical Exam:  Physical Exam Vitals reviewed.  Constitutional:      Appearance: Normal appearance.  Skin:    General: Skin is warm and dry.     Comments: Back incision and drainage site significantly improved with small opening and underlying tracking at the 11 o'clock position, no surrounding erythema, or induration, nontender to palpation  Neurological:     Mental Status: He is alert.     Laboratory studies: None   Imaging:  None   Assessment:  68 y.o. yo Male who presents for follow-up of a back wound status post incision and drainage by PCP and emergency department  Plan:  - Area is continuing to improve - Continue with packing changes as frequently as they are able to be performed.  That eventually the opening will become so small that packing will not be able to be placed.  When this happens, he should keep the area covered until the area scabs over - Call the office if the area worsens, if he has pain, erythema, fever, chills, or purulent drainage - Follow up with me in 1 month  All of the above recommendations were discussed with the patient, and all of patient's questions were answered to his expressed satisfaction.  Note: Portions of this report may have  been transcribed using voice recognition software. Every effort has been made to ensure accuracy; however, inadvertent computerized transcription errors may still be present.   Lidia Reels, DO [facility] [address] [place], [place] [address] [phone] (office)

## 2024-03-28 DIAGNOSIS — G4700 Insomnia, unspecified: Secondary | ICD-10-CM | POA: Diagnosis not present

## 2024-03-28 DIAGNOSIS — E785 Hyperlipidemia, unspecified: Secondary | ICD-10-CM | POA: Diagnosis not present

## 2024-03-28 DIAGNOSIS — I2510 Atherosclerotic heart disease of native coronary artery without angina pectoris: Secondary | ICD-10-CM | POA: Diagnosis not present

## 2024-03-28 DIAGNOSIS — E1142 Type 2 diabetes mellitus with diabetic polyneuropathy: Secondary | ICD-10-CM | POA: Diagnosis not present

## 2024-03-28 DIAGNOSIS — I77810 Thoracic aortic ectasia: Secondary | ICD-10-CM | POA: Diagnosis not present

## 2024-03-28 DIAGNOSIS — Z7982 Long term (current) use of aspirin: Secondary | ICD-10-CM | POA: Diagnosis not present

## 2024-03-28 DIAGNOSIS — Z794 Long term (current) use of insulin: Secondary | ICD-10-CM | POA: Diagnosis not present

## 2024-03-28 DIAGNOSIS — L720 Epidermal cyst: Secondary | ICD-10-CM | POA: Diagnosis not present

## 2024-03-28 DIAGNOSIS — G8929 Other chronic pain: Secondary | ICD-10-CM | POA: Diagnosis not present

## 2024-03-28 DIAGNOSIS — M47817 Spondylosis without myelopathy or radiculopathy, lumbosacral region: Secondary | ICD-10-CM | POA: Diagnosis not present

## 2024-03-28 DIAGNOSIS — E1169 Type 2 diabetes mellitus with other specified complication: Secondary | ICD-10-CM | POA: Diagnosis not present

## 2024-03-28 DIAGNOSIS — M549 Dorsalgia, unspecified: Secondary | ICD-10-CM | POA: Diagnosis not present

## 2024-03-28 DIAGNOSIS — M51369 Other intervertebral disc degeneration, lumbar region without mention of lumbar back pain or lower extremity pain: Secondary | ICD-10-CM | POA: Diagnosis not present

## 2024-03-28 DIAGNOSIS — I350 Nonrheumatic aortic (valve) stenosis: Secondary | ICD-10-CM | POA: Diagnosis not present

## 2024-03-28 DIAGNOSIS — Z7984 Long term (current) use of oral hypoglycemic drugs: Secondary | ICD-10-CM | POA: Diagnosis not present

## 2024-03-28 DIAGNOSIS — L02212 Cutaneous abscess of back [any part, except buttock]: Secondary | ICD-10-CM | POA: Diagnosis not present

## 2024-03-28 DIAGNOSIS — I119 Hypertensive heart disease without heart failure: Secondary | ICD-10-CM | POA: Diagnosis not present

## 2024-03-28 DIAGNOSIS — E113299 Type 2 diabetes mellitus with mild nonproliferative diabetic retinopathy without macular edema, unspecified eye: Secondary | ICD-10-CM | POA: Diagnosis not present

## 2024-04-02 DIAGNOSIS — Z7982 Long term (current) use of aspirin: Secondary | ICD-10-CM | POA: Diagnosis not present

## 2024-04-02 DIAGNOSIS — Z794 Long term (current) use of insulin: Secondary | ICD-10-CM | POA: Diagnosis not present

## 2024-04-02 DIAGNOSIS — I350 Nonrheumatic aortic (valve) stenosis: Secondary | ICD-10-CM | POA: Diagnosis not present

## 2024-04-02 DIAGNOSIS — E785 Hyperlipidemia, unspecified: Secondary | ICD-10-CM | POA: Diagnosis not present

## 2024-04-02 DIAGNOSIS — M549 Dorsalgia, unspecified: Secondary | ICD-10-CM | POA: Diagnosis not present

## 2024-04-02 DIAGNOSIS — M47817 Spondylosis without myelopathy or radiculopathy, lumbosacral region: Secondary | ICD-10-CM | POA: Diagnosis not present

## 2024-04-02 DIAGNOSIS — I2510 Atherosclerotic heart disease of native coronary artery without angina pectoris: Secondary | ICD-10-CM | POA: Diagnosis not present

## 2024-04-02 DIAGNOSIS — E1169 Type 2 diabetes mellitus with other specified complication: Secondary | ICD-10-CM | POA: Diagnosis not present

## 2024-04-02 DIAGNOSIS — G8929 Other chronic pain: Secondary | ICD-10-CM | POA: Diagnosis not present

## 2024-04-02 DIAGNOSIS — Z7984 Long term (current) use of oral hypoglycemic drugs: Secondary | ICD-10-CM | POA: Diagnosis not present

## 2024-04-02 DIAGNOSIS — E118 Type 2 diabetes mellitus with unspecified complications: Secondary | ICD-10-CM | POA: Diagnosis not present

## 2024-04-02 DIAGNOSIS — I119 Hypertensive heart disease without heart failure: Secondary | ICD-10-CM | POA: Diagnosis not present

## 2024-04-02 DIAGNOSIS — M51369 Other intervertebral disc degeneration, lumbar region without mention of lumbar back pain or lower extremity pain: Secondary | ICD-10-CM | POA: Diagnosis not present

## 2024-04-02 DIAGNOSIS — I77810 Thoracic aortic ectasia: Secondary | ICD-10-CM | POA: Diagnosis not present

## 2024-04-02 DIAGNOSIS — E1142 Type 2 diabetes mellitus with diabetic polyneuropathy: Secondary | ICD-10-CM | POA: Diagnosis not present

## 2024-04-02 DIAGNOSIS — L720 Epidermal cyst: Secondary | ICD-10-CM | POA: Diagnosis not present

## 2024-04-02 DIAGNOSIS — E113299 Type 2 diabetes mellitus with mild nonproliferative diabetic retinopathy without macular edema, unspecified eye: Secondary | ICD-10-CM | POA: Diagnosis not present

## 2024-04-02 DIAGNOSIS — L02212 Cutaneous abscess of back [any part, except buttock]: Secondary | ICD-10-CM | POA: Diagnosis not present

## 2024-04-02 DIAGNOSIS — G4700 Insomnia, unspecified: Secondary | ICD-10-CM | POA: Diagnosis not present

## 2024-04-05 DIAGNOSIS — Z7984 Long term (current) use of oral hypoglycemic drugs: Secondary | ICD-10-CM | POA: Diagnosis not present

## 2024-04-05 DIAGNOSIS — I2510 Atherosclerotic heart disease of native coronary artery without angina pectoris: Secondary | ICD-10-CM | POA: Diagnosis not present

## 2024-04-05 DIAGNOSIS — G8929 Other chronic pain: Secondary | ICD-10-CM | POA: Diagnosis not present

## 2024-04-05 DIAGNOSIS — E1142 Type 2 diabetes mellitus with diabetic polyneuropathy: Secondary | ICD-10-CM | POA: Diagnosis not present

## 2024-04-05 DIAGNOSIS — M51369 Other intervertebral disc degeneration, lumbar region without mention of lumbar back pain or lower extremity pain: Secondary | ICD-10-CM | POA: Diagnosis not present

## 2024-04-05 DIAGNOSIS — M47817 Spondylosis without myelopathy or radiculopathy, lumbosacral region: Secondary | ICD-10-CM | POA: Diagnosis not present

## 2024-04-05 DIAGNOSIS — E785 Hyperlipidemia, unspecified: Secondary | ICD-10-CM | POA: Diagnosis not present

## 2024-04-05 DIAGNOSIS — Z7982 Long term (current) use of aspirin: Secondary | ICD-10-CM | POA: Diagnosis not present

## 2024-04-05 DIAGNOSIS — L720 Epidermal cyst: Secondary | ICD-10-CM | POA: Diagnosis not present

## 2024-04-05 DIAGNOSIS — G4700 Insomnia, unspecified: Secondary | ICD-10-CM | POA: Diagnosis not present

## 2024-04-05 DIAGNOSIS — I119 Hypertensive heart disease without heart failure: Secondary | ICD-10-CM | POA: Diagnosis not present

## 2024-04-05 DIAGNOSIS — M549 Dorsalgia, unspecified: Secondary | ICD-10-CM | POA: Diagnosis not present

## 2024-04-05 DIAGNOSIS — I77810 Thoracic aortic ectasia: Secondary | ICD-10-CM | POA: Diagnosis not present

## 2024-04-05 DIAGNOSIS — L02212 Cutaneous abscess of back [any part, except buttock]: Secondary | ICD-10-CM | POA: Diagnosis not present

## 2024-04-05 DIAGNOSIS — I350 Nonrheumatic aortic (valve) stenosis: Secondary | ICD-10-CM | POA: Diagnosis not present

## 2024-04-05 DIAGNOSIS — Z794 Long term (current) use of insulin: Secondary | ICD-10-CM | POA: Diagnosis not present

## 2024-04-05 DIAGNOSIS — E1169 Type 2 diabetes mellitus with other specified complication: Secondary | ICD-10-CM | POA: Diagnosis not present

## 2024-04-05 DIAGNOSIS — E113299 Type 2 diabetes mellitus with mild nonproliferative diabetic retinopathy without macular edema, unspecified eye: Secondary | ICD-10-CM | POA: Diagnosis not present

## 2024-04-12 DIAGNOSIS — E1169 Type 2 diabetes mellitus with other specified complication: Secondary | ICD-10-CM | POA: Diagnosis not present

## 2024-04-12 DIAGNOSIS — M47817 Spondylosis without myelopathy or radiculopathy, lumbosacral region: Secondary | ICD-10-CM | POA: Diagnosis not present

## 2024-04-12 DIAGNOSIS — I77810 Thoracic aortic ectasia: Secondary | ICD-10-CM | POA: Diagnosis not present

## 2024-04-12 DIAGNOSIS — L720 Epidermal cyst: Secondary | ICD-10-CM | POA: Diagnosis not present

## 2024-04-12 DIAGNOSIS — I119 Hypertensive heart disease without heart failure: Secondary | ICD-10-CM | POA: Diagnosis not present

## 2024-04-12 DIAGNOSIS — M549 Dorsalgia, unspecified: Secondary | ICD-10-CM | POA: Diagnosis not present

## 2024-04-12 DIAGNOSIS — Z7982 Long term (current) use of aspirin: Secondary | ICD-10-CM | POA: Diagnosis not present

## 2024-04-12 DIAGNOSIS — G8929 Other chronic pain: Secondary | ICD-10-CM | POA: Diagnosis not present

## 2024-04-12 DIAGNOSIS — Z794 Long term (current) use of insulin: Secondary | ICD-10-CM | POA: Diagnosis not present

## 2024-04-12 DIAGNOSIS — I350 Nonrheumatic aortic (valve) stenosis: Secondary | ICD-10-CM | POA: Diagnosis not present

## 2024-04-12 DIAGNOSIS — M51369 Other intervertebral disc degeneration, lumbar region without mention of lumbar back pain or lower extremity pain: Secondary | ICD-10-CM | POA: Diagnosis not present

## 2024-04-12 DIAGNOSIS — E1142 Type 2 diabetes mellitus with diabetic polyneuropathy: Secondary | ICD-10-CM | POA: Diagnosis not present

## 2024-04-12 DIAGNOSIS — G4700 Insomnia, unspecified: Secondary | ICD-10-CM | POA: Diagnosis not present

## 2024-04-12 DIAGNOSIS — L02212 Cutaneous abscess of back [any part, except buttock]: Secondary | ICD-10-CM | POA: Diagnosis not present

## 2024-04-12 DIAGNOSIS — E785 Hyperlipidemia, unspecified: Secondary | ICD-10-CM | POA: Diagnosis not present

## 2024-04-12 DIAGNOSIS — E113299 Type 2 diabetes mellitus with mild nonproliferative diabetic retinopathy without macular edema, unspecified eye: Secondary | ICD-10-CM | POA: Diagnosis not present

## 2024-04-12 DIAGNOSIS — I2510 Atherosclerotic heart disease of native coronary artery without angina pectoris: Secondary | ICD-10-CM | POA: Diagnosis not present

## 2024-04-12 DIAGNOSIS — Z7984 Long term (current) use of oral hypoglycemic drugs: Secondary | ICD-10-CM | POA: Diagnosis not present

## 2024-04-16 ENCOUNTER — Ambulatory Visit (INDEPENDENT_AMBULATORY_CARE_PROVIDER_SITE_OTHER): Admitting: Surgery

## 2024-04-16 ENCOUNTER — Encounter: Payer: Self-pay | Admitting: Surgery

## 2024-04-16 VITALS — BP 106/64 | HR 50 | Temp 97.5°F | Resp 16 | Ht 71.0 in | Wt 213.0 lb

## 2024-04-16 DIAGNOSIS — M549 Dorsalgia, unspecified: Secondary | ICD-10-CM | POA: Diagnosis not present

## 2024-04-16 DIAGNOSIS — L02212 Cutaneous abscess of back [any part, except buttock]: Secondary | ICD-10-CM | POA: Diagnosis not present

## 2024-04-16 DIAGNOSIS — I350 Nonrheumatic aortic (valve) stenosis: Secondary | ICD-10-CM | POA: Diagnosis not present

## 2024-04-16 DIAGNOSIS — E785 Hyperlipidemia, unspecified: Secondary | ICD-10-CM | POA: Diagnosis not present

## 2024-04-16 DIAGNOSIS — Z794 Long term (current) use of insulin: Secondary | ICD-10-CM | POA: Diagnosis not present

## 2024-04-16 DIAGNOSIS — L720 Epidermal cyst: Secondary | ICD-10-CM | POA: Diagnosis not present

## 2024-04-16 DIAGNOSIS — M47817 Spondylosis without myelopathy or radiculopathy, lumbosacral region: Secondary | ICD-10-CM | POA: Diagnosis not present

## 2024-04-16 DIAGNOSIS — M51369 Other intervertebral disc degeneration, lumbar region without mention of lumbar back pain or lower extremity pain: Secondary | ICD-10-CM | POA: Diagnosis not present

## 2024-04-16 DIAGNOSIS — S21201D Unspecified open wound of right back wall of thorax without penetration into thoracic cavity, subsequent encounter: Secondary | ICD-10-CM | POA: Diagnosis not present

## 2024-04-16 DIAGNOSIS — I77810 Thoracic aortic ectasia: Secondary | ICD-10-CM | POA: Diagnosis not present

## 2024-04-16 DIAGNOSIS — G4700 Insomnia, unspecified: Secondary | ICD-10-CM | POA: Diagnosis not present

## 2024-04-16 DIAGNOSIS — E1142 Type 2 diabetes mellitus with diabetic polyneuropathy: Secondary | ICD-10-CM | POA: Diagnosis not present

## 2024-04-16 DIAGNOSIS — E113299 Type 2 diabetes mellitus with mild nonproliferative diabetic retinopathy without macular edema, unspecified eye: Secondary | ICD-10-CM | POA: Diagnosis not present

## 2024-04-16 DIAGNOSIS — I2510 Atherosclerotic heart disease of native coronary artery without angina pectoris: Secondary | ICD-10-CM | POA: Diagnosis not present

## 2024-04-16 DIAGNOSIS — I119 Hypertensive heart disease without heart failure: Secondary | ICD-10-CM | POA: Diagnosis not present

## 2024-04-16 DIAGNOSIS — E1169 Type 2 diabetes mellitus with other specified complication: Secondary | ICD-10-CM | POA: Diagnosis not present

## 2024-04-16 DIAGNOSIS — Z7982 Long term (current) use of aspirin: Secondary | ICD-10-CM | POA: Diagnosis not present

## 2024-04-16 DIAGNOSIS — Z7984 Long term (current) use of oral hypoglycemic drugs: Secondary | ICD-10-CM | POA: Diagnosis not present

## 2024-04-16 DIAGNOSIS — G8929 Other chronic pain: Secondary | ICD-10-CM | POA: Diagnosis not present

## 2024-04-17 NOTE — Progress Notes (Signed)
 [facility] Note   HPI:  68 y.o. Male presents to clinic for follow-up evaluation of his right back wound from a previous incision and drainage of a back abscess by his primary care provider and ED physician.  He has been doing well.  Home nursing has been helping him with dressing changes twice per week.  They are no longer able to put any packing wound.  He denies any pain, fever, chills, or drainage from the site.  Review of Systems:  All other review of systems: otherwise negative   Vital Signs:  BP 106/64   Pulse (!) 50   Temp (!) 97.5 F (36.4 C) (Oral)   Resp 16   Ht 5\' 11"  (1.803 m)   Wt 213 lb (96.6 kg)   SpO2 94%   BMI 29.71 kg/m    Physical Exam:  Physical Exam Vitals reviewed.  Constitutional:      Appearance: Normal appearance.  Skin:    General: Skin is warm and dry.     Comments: Back incision and drainage site healing well, punctate area where previous incision site was, no opening to allow for packing, no surrounding erythema or induration  Neurological:     Mental Status: He is alert.     Laboratory studies: None  Imaging:  None  Assessment:  68 y.o. yo Male who presents for follow-up of low back wound status post incision and drainage by PCP and ED.  Plan:  - Area has completely closed up, and is no longer able to be packed - Advised that he can apply antibiotic ointment to the area and cover with a bandage or leave the area open and uncovered without antibiotic ointment.  No need for further dressing changes given there is no area to be packed - We discussed that in the future, we can evaluate need for cyst excision, but currently I would recommend against cyst excision - Follow up as needed to discuss cyst excision versus evaluation of recurrent abscess  All of the above recommendations were discussed with the patient, and all of patient's questions were answered to his expressed satisfaction.  Note: Portions of this report may  have been transcribed using voice recognition software. Every effort has been made to ensure accuracy; however, inadvertent computerized transcription errors may still be present.   Lidia Reels, DO [facility] [address] [place] [place], [place] [address] [phone] (office)

## 2024-04-24 ENCOUNTER — Ambulatory Visit (INDEPENDENT_AMBULATORY_CARE_PROVIDER_SITE_OTHER): Admitting: Family Medicine

## 2024-04-24 DIAGNOSIS — L720 Epidermal cyst: Secondary | ICD-10-CM

## 2024-04-24 NOTE — Progress Notes (Signed)
   Same day cancel

## 2024-04-26 ENCOUNTER — Telehealth: Payer: Self-pay | Admitting: *Deleted

## 2024-04-26 ENCOUNTER — Ambulatory Visit: Admitting: Family Medicine

## 2024-04-26 NOTE — Telephone Encounter (Signed)
 Received call from patient [phone]~ telephone.   Patient requested appointment. States that he has a new infected area on his chest. Reports large red abscess like area to mid chest. States that area is warm to touch, tender to touch, and appears to have purulent fluid collection under skin.   States that he thinks it's the same as his previous back wound.   Advised patient that no appointments are available today (04/26/2024). Advised to follow up PCP or go to UC/ ER for treatment. Recommended ER as UC may not perform I&D if needed.   Patient notably upset on call and stated that he cannot see his PCP as she is out of office. States that he will not go to UC/ ER because "they won't do anything."  Advised that UC/ ER can prescribe ABTx. Patient replied that our office could prescribe ABTx as well.   Advised that area of concern is in a new location and therefore we have not evaluated the area. Advised that provider will usually not treat something that has not been evaluated. Again advised to go to ER.  Patient disconnected call.   Of note, patient had same day appointment with PCP on 04/24/2024 that he cancelled. Noted to have scheduled appointment with PCP for 04/26/2024.

## 2024-04-30 ENCOUNTER — Encounter: Payer: Self-pay | Admitting: Family Medicine

## 2024-04-30 ENCOUNTER — Ambulatory Visit (INDEPENDENT_AMBULATORY_CARE_PROVIDER_SITE_OTHER): Admitting: Family Medicine

## 2024-04-30 ENCOUNTER — Telehealth: Payer: Self-pay

## 2024-04-30 VITALS — BP 118/68 | HR 65 | Temp 98.1°F | Wt 214.0 lb

## 2024-04-30 DIAGNOSIS — L02213 Cutaneous abscess of chest wall: Secondary | ICD-10-CM

## 2024-04-30 DIAGNOSIS — L720 Epidermal cyst: Secondary | ICD-10-CM

## 2024-04-30 MED ORDER — MUPIROCIN 2 % EX OINT: 1.0000 | TOPICAL_OINTMENT | Freq: Two times a day (BID) | CUTANEOUS | 0 refills | Status: DC

## 2024-04-30 MED ORDER — DOXYCYCLINE HYCLATE 100 MG PO TABS: 100.0000 mg | ORAL_TABLET | Freq: Two times a day (BID) | ORAL | 0 refills | Status: DC

## 2024-04-30 NOTE — Patient Instructions (Signed)
 Clean wound with warm soapy water twice a day and then apply ointment prescribed.  Start doxycyline every 12 hours with food for 10 days.   Follow up in 7 days.

## 2024-04-30 NOTE — Progress Notes (Signed)
 [name] , 11/30/1955, 68 y.o., male MRN: [id number] Patient Care Team    Relationship Specialty Notifications Start End  Mariel Shope, DO PCP - General Family Medicine  11/29/18   Ronelle Coffee, MD Consulting Physician Ophthalmology  01/09/19   Laurann Pollock, MD Consulting Physician Cardiology  07/19/23     Chief Complaint  Patient presents with   Cyst    2 weeks, chest area. Cyst ruptured last night while pt was sleeping.      Subjective: [name] is a 68 y.o. Pt presents for an OV with complaints of right sided chest wall cyst of 2 weeks duration.  Associated symptoms include redness, pain and swelling.  He reports the cyst burst during his sleep last night.  Pus like drainage was appreciated. Patient denies fevers or chills.  He is a diabetic. He had been followed for a back abscess since January, which started with a large epidural cyst that became infected.     02/27/2024    1:46 PM 08/21/2023    1:04 PM 03/24/2023    1:35 PM 12/09/2022    1:43 PM 12/05/2022    9:54 AM  Depression screen PHQ 2/9  Decreased Interest 0 0 0 2 1  Down, Depressed, Hopeless 0 0 0 0 0  PHQ - 2 Score 0 0 0 2 1  Altered sleeping 0   0   Tired, decreased energy 2   2   Change in appetite 2   3   Feeling bad or failure about yourself  0   0   Trouble concentrating 0   2   Moving slowly or fidgety/restless 0   0   Suicidal thoughts 0   0   PHQ-9 Score 4   9   Difficult doing work/chores Not difficult at all        Allergies  Allergen Reactions   Gabapentin  Other (See Comments)    Paranoia/hallucinations   Hydrocodone  Other (See Comments)    PT reports  Confusion when tacking Hydrocodone    Nsaids     History of heart disease, CAD with history of MI, CABG status post stent   Prednisone     Paranoia   Tramadol     Penicillins Other (See Comments)    Childhood unsure if still allgeric   Social History   Social History Narrative   Marital status/children/pets: Single    Education/employment: HS grad, 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:      -smoke alarm in the home:Yes     - wears seatbelt: Yes      Past Medical History:  Diagnosis Date   Alcohol addiction (HCC)    Anxiety    Arthritis    Bulging of lumbar intervertebral disc 2017   MRI: L3-4, L4-5 and L5-S1- facet hypertrophy and formainal stenosis   CAD (coronary artery disease)    Coronary artery disease with hx of myocardial infarct w/o hx of CABG 2004   Depression    Dextrocardia    Diabetes (HCC)    Heart attack (HCC) 2004   Stent placement 2008   Hyperlipidemia    Hypertension    Insomnia    Past Surgical History:  Procedure Laterality Date   CORONARY ANGIOPLASTY WITH STENT PLACEMENT  2008   Family History  Problem Relation Age of Onset   Heart attack Mother    Heart disease Mother    Diabetes Mother    Arthritis Mother  Hyperlipidemia Mother    Hypertension Mother    Breast cancer Mother    Heart attack Father    Heart disease Father    Alcohol abuse Father    Early death Father    Arthritis Sister    Heart disease Sister    Hyperlipidemia Sister    Asthma Sister    COPD Sister    Diabetes Sister    Alcohol abuse Sister    Allergies as of 04/30/2024       Reactions   Gabapentin  Other (See Comments)   Paranoia/hallucinations   Hydrocodone  Other (See Comments)   PT reports  Confusion when tacking Hydrocodone    Nsaids    History of heart disease, CAD with history of MI, CABG status post stent   Prednisone    Paranoia   Tramadol     Penicillins Other (See Comments)   Childhood unsure if still allgeric        Medication List        Accurate as of April 30, 2024  3:14 PM. If you have any questions, ask your nurse or doctor.          Accu-Chek FastClix Lancets Misc   aspirin  EC 81 MG tablet Take 1 tablet (81 mg total) by mouth daily.   dapagliflozin  propanediol 10 MG Tabs tablet Commonly known as: Farxiga  TAKE 1 TABLET BY MOUTH  DAILY BEFORE  BREAKFAST   diazepam  5 MG tablet Commonly known as: VALIUM  Take 1 tablet (5 mg total) by mouth every 6 (six) hours as needed for anxiety.   doxycycline  100 MG tablet Commonly known as: VIBRA -TABS Take 1 tablet (100 mg total) by mouth 2 (two) times daily. Started by: CMA Chloe C   Janumet  XR [phone] MG Tb24 Generic drug: SitaGLIPtin -MetFORMIN  HCl Take 1 tablet by mouth daily.   Lantus  SoloStar 100 UNIT/ML Solostar Pen Generic drug: insulin glargine  Inject 70 Units into the skin daily.   lisinopril  20 MG tablet Commonly known as: ZESTRIL  Take 1 tablet (20 mg total) by mouth daily.   mupirocin  ointment 2 % Commonly known as: BACTROBAN  Apply 1 Application topically 2 (two) times daily. To abscess Started by: CMA Chloe C   naproxen  500 MG tablet Commonly known as: NAPROSYN  TAKE 1 TABLET BY MOUTH EVERY 12 HOURS W/FOOD AS NEEDED ONLY *NEW PRESCRIPTION REQUEST*   omega-3 acid ethyl esters 1 g capsule Commonly known as: LOVAZA  Take 1 capsule (1 g total) by mouth daily.   OneTouch Verio test strip Generic drug: glucose blood USE AS DIRECTED *NEW PRESCRIPTION REQUEST*   rosuvastatin  40 MG tablet Commonly known as: CRESTOR  Take 1 tablet (40 mg total) by mouth at bedtime.   traZODone  100 MG tablet Commonly known as: DESYREL  Take 1 tablet (100 mg total) by mouth at bedtime as needed for sleep.   UltiCare Pen Needles 31G X 5 MM Misc Generic drug: Insulin Pen Needle USE TO INJECT PEN INSULIN *NEW PRESCRIPTION REQUEST*        All past medical history, surgical history, allergies, family history, immunizations andmedications were updated in the EMR today and reviewed under the history and medication portions of their EMR.     ROS Negative, with the exception of above mentioned in HPI   Objective:  BP 118/68   Pulse 65   Temp 98.1 F (36.7 C)   Wt 214 lb (97.1 kg)   SpO2 95%   BMI 29.85 kg/m  Body mass index is 29.85 kg/m. Physical Exam Vitals and nursing note  reviewed. Exam conducted with a chaperone present.  Constitutional:      General: He is not in acute distress.    Appearance: Normal appearance. He is not ill-appearing, toxic-appearing or diaphoretic.  HENT:     Head: Normocephalic and atraumatic.  Eyes:     General: No scleral icterus.       Right eye: No discharge.        Left eye: No discharge.     Extraocular Movements: Extraocular movements intact.     Pupils: Pupils are equal, round, and reactive to light.  Skin:    General: Skin is warm and dry.     Coloration: Skin is not jaundiced or pale.     Findings: No rash.     Comments: Chest wall: Right chest wall medial to the right areola with redness, swelling over cystic mass.  Fluctuance present.  Mass approximately 3 cm x 3 cm  Neurological:     Mental Status: He is alert and oriented to person, place, and time. Mental status is at baseline.  Psychiatric:        Mood and Affect: Mood normal.        Behavior: Behavior normal.        Thought Content: Thought content normal.        Judgment: Judgment normal.      No results found. No results found. No results found for this or any previous visit (from the past 24 hours).  Assessment/Plan: HAYLEN SHELNUTT is a 68 y.o. male present for OV for  Abscess of chest wall (Primary)/epidermal cyst Infected epidermal cyst present right chest wall. - Wound culture obtained -Doxycycline  twice daily x 10 days -Daily wash with warm water and Hibiclens (provided today) twice daily -Apply Bactroban  ointment twice daily after cleansing and keep area covered Follow-up in 7 days   PROCEDURE PERFORMED: I&D epidermal cyst  INDICATIONS FOR PROCEDURE: Infected epidermal cyst  PROCEDURE: The site was cleaned with antiseptic.The patient was prepped and draped in a usual manner.  Abscess recently self drained, therefore no incision required today.  2 cc of purulent drainage expressed from wound today.  Wound was then irrigated with sterile  saline solution 10 cc.  The site was then checked for bleeding. The wound was cleansed with Hibiclens and saline again.  Antibiotic ointment applied, with sterile dressing. Wound culture sent Patient instructed on daily wound care Follow-up in 7 days    Reviewed expectations re: course of current medical issues. Discussed self-management of symptoms. Outlined signs and symptoms indicating need for more acute intervention. Patient verbalized understanding and all questions were answered. Patient received an After-Visit Summary.    Orders Placed This Encounter  Procedures   Wound culture   Meds ordered this encounter  Medications   DISCONTD: doxycycline  (VIBRA -TABS) 100 MG tablet    Sig: Take 1 tablet (100 mg total) by mouth 2 (two) times daily.    Dispense:  20 tablet    Refill:  0   DISCONTD: mupirocin  ointment (BACTROBAN ) 2 %    Sig: Apply 1 Application topically 2 (two) times daily. To abscess    Dispense:  22 g    Refill:  0   Referral Orders  No referral(s) requested today     Note is dictated utilizing voice recognition software. Although note has been proof read prior to signing, occasional typographical errors still can be missed. If any questions arise, please do not hesitate to call for verification.   electronically signed by:  Napolean Backbone, DO  [facility] [place]

## 2024-04-30 NOTE — Telephone Encounter (Signed)
 Communication  Reason for CRM: Patient called in stated prescription was sent to the wrong pharmacy need the prescriptions    mupirocin  ointment (BACTROBAN ) 2 %    doxycycline  (VIBRA -TABS) 100 MG tablet        [facility] [place], [place] - [address]    [address] [place] [address]    Phone: [phone] Fax: [phone]    Hours: Not open 24 hours   Rx sent.

## 2024-05-03 DIAGNOSIS — E118 Type 2 diabetes mellitus with unspecified complications: Secondary | ICD-10-CM | POA: Diagnosis not present

## 2024-05-04 LAB — WOUND CULTURE
MICRO NUMBER:: 16535508
RESULT:: NO GROWTH
SPECIMEN QUALITY:: ADEQUATE

## 2024-05-07 ENCOUNTER — Encounter: Payer: Self-pay | Admitting: Family Medicine

## 2024-05-07 ENCOUNTER — Ambulatory Visit (INDEPENDENT_AMBULATORY_CARE_PROVIDER_SITE_OTHER): Admitting: Family Medicine

## 2024-05-07 VITALS — BP 118/68 | HR 64 | Temp 98.1°F | Wt 211.0 lb

## 2024-05-07 DIAGNOSIS — L720 Epidermal cyst: Secondary | ICD-10-CM

## 2024-05-07 MED ORDER — OMEGA-3-ACID ETHYL ESTERS 1 G PO CAPS: 1.0000 | ORAL_CAPSULE | Freq: Every day | ORAL | 3 refills | Status: DC

## 2024-05-07 NOTE — Patient Instructions (Signed)
 Finish antibiotics.  Keeping cleaning with Hibiclens.   Looks good.  Should continue healing well.

## 2024-05-07 NOTE — Progress Notes (Signed)
 [name] , 1955-12-19, 68 y.o., male MRN: [id number] Patient Care Team    Relationship Specialty Notifications Start End  Mariel Shope, DO PCP - General Family Medicine  11/29/18   Ronelle Coffee, MD Consulting Physician Ophthalmology  01/09/19   Laurann Pollock, MD Consulting Physician Cardiology  07/19/23     Chief Complaint  Patient presents with   Cyst    F/U.      Subjective: [name] is a 68 y.o. Pt presents for an OV for follow-up on epidermal cyst infection.  Patient reports she has been compliant with antibiotic and cleansing chest area 1-2 times a day with Hibiclens.  He has been keeping area covered with a large Band-Aid.  He states he feels like is getting much better not as painful or red.  No longer draining. Prior note with complaints of right sided chest wall cyst of 2 weeks duration.  Associated symptoms include redness, pain and swelling.  He reports the cyst burst during his sleep last night.  Pus like drainage was appreciated. Patient denies fevers or chills.  He is a diabetic. He had been followed for a back abscess since January, which started with a large epidural cyst that became infected.     02/27/2024    1:46 PM 08/21/2023    1:04 PM 03/24/2023    1:35 PM 12/09/2022    1:43 PM 12/05/2022    9:54 AM  Depression screen PHQ 2/9  Decreased Interest 0 0 0 2 1  Down, Depressed, Hopeless 0 0 0 0 0  PHQ - 2 Score 0 0 0 2 1  Altered sleeping 0   0   Tired, decreased energy 2   2   Change in appetite 2   3   Feeling bad or failure about yourself  0   0   Trouble concentrating 0   2   Moving slowly or fidgety/restless 0   0   Suicidal thoughts 0   0   PHQ-9 Score 4   9   Difficult doing work/chores Not difficult at all        Allergies  Allergen Reactions   Gabapentin  Other (See Comments)    Paranoia/hallucinations   Hydrocodone  Other (See Comments)    PT reports  Confusion when tacking Hydrocodone    Nsaids     History of heart  disease, CAD with history of MI, CABG status post stent   Prednisone     Paranoia   Tramadol     Penicillins Other (See Comments)    Childhood unsure if still allgeric   Social History   Social History Narrative   Marital status/children/pets: Single   Education/employment: HS grad, 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:      -smoke alarm in the home:Yes     - wears seatbelt: Yes      Past Medical History:  Diagnosis Date   Alcohol addiction (HCC)    Anxiety    Arthritis    Bulging of lumbar intervertebral disc 2017   MRI: L3-4, L4-5 and L5-S1- facet hypertrophy and formainal stenosis   CAD (coronary artery disease)    Coronary artery disease with hx of myocardial infarct w/o hx of CABG 2004   Depression    Dextrocardia    Diabetes (HCC)    Heart attack (HCC) 2004   Stent placement 2008   Hyperlipidemia    Hypertension    Insomnia    Past Surgical History:  Procedure Laterality Date   CORONARY ANGIOPLASTY WITH STENT PLACEMENT  2008   Family History  Problem Relation Age of Onset   Heart attack Mother    Heart disease Mother    Diabetes Mother    Arthritis Mother    Hyperlipidemia Mother    Hypertension Mother    Breast cancer Mother    Heart attack Father    Heart disease Father    Alcohol abuse Father    Early death Father    Arthritis Sister    Heart disease Sister    Hyperlipidemia Sister    Asthma Sister    COPD Sister    Diabetes Sister    Alcohol abuse Sister    Allergies as of 05/07/2024       Reactions   Gabapentin  Other (See Comments)   Paranoia/hallucinations   Hydrocodone  Other (See Comments)   PT reports  Confusion when tacking Hydrocodone    Nsaids    History of heart disease, CAD with history of MI, CABG status post stent   Prednisone    Paranoia   Tramadol     Penicillins Other (See Comments)   Childhood unsure if still allgeric        Medication List        Accurate as of May 07, 2024  2:40 PM. If you have any questions, ask  your nurse or doctor.          Accu-Chek FastClix Lancets Misc   aspirin  EC 81 MG tablet Take 1 tablet (81 mg total) by mouth daily.   dapagliflozin  propanediol 10 MG Tabs tablet Commonly known as: Farxiga  TAKE 1 TABLET BY MOUTH  DAILY BEFORE BREAKFAST   diazepam  5 MG tablet Commonly known as: VALIUM  Take 1 tablet (5 mg total) by mouth every 6 (six) hours as needed for anxiety.   doxycycline  100 MG tablet Commonly known as: VIBRA -TABS Take 1 tablet (100 mg total) by mouth 2 (two) times daily.   Janumet  XR [id number] MG Tb24 Generic drug: SitaGLIPtin -MetFORMIN  HCl Take 1 tablet by mouth daily.   Lantus  SoloStar 100 UNIT/ML Solostar Pen Generic drug: insulin glargine  Inject 70 Units into the skin daily.   lisinopril  20 MG tablet Commonly known as: ZESTRIL  Take 1 tablet (20 mg total) by mouth daily.   mupirocin  ointment 2 % Commonly known as: BACTROBAN  Apply 1 Application topically 2 (two) times daily. To abscess   naproxen  500 MG tablet Commonly known as: NAPROSYN  TAKE 1 TABLET BY MOUTH EVERY 12 HOURS W/FOOD AS NEEDED ONLY *NEW PRESCRIPTION REQUEST*   omega-3 acid ethyl esters 1 g capsule Commonly known as: LOVAZA  Take 1 capsule (1 g total) by mouth daily.   OneTouch Verio test strip Generic drug: glucose blood USE AS DIRECTED *NEW PRESCRIPTION REQUEST*   rosuvastatin  40 MG tablet Commonly known as: CRESTOR  Take 1 tablet (40 mg total) by mouth at bedtime.   traZODone  100 MG tablet Commonly known as: DESYREL  Take 1 tablet (100 mg total) by mouth at bedtime as needed for sleep.   UltiCare Pen Needles 31G X 5 MM Misc Generic drug: Insulin Pen Needle USE TO INJECT PEN INSULIN *NEW PRESCRIPTION REQUEST*        All past medical history, surgical history, allergies, family history, immunizations andmedications were updated in the EMR today and reviewed under the history and medication portions of their EMR.     ROS Negative, with the exception of above  mentioned in HPI   Objective:  BP 118/68   Pulse 64  Temp 98.1 F (36.7 C)   Wt 211 lb (95.7 kg)   SpO2 98%   BMI 29.43 kg/m  Body mass index is 29.43 kg/m. Physical Exam Vitals and nursing note reviewed. Exam conducted with a chaperone present.  Constitutional:      General: He is not in acute distress.    Appearance: Normal appearance. He is not ill-appearing, toxic-appearing or diaphoretic.  HENT:     Head: Normocephalic and atraumatic.  Eyes:     General: No scleral icterus.       Right eye: No discharge.        Left eye: No discharge.     Extraocular Movements: Extraocular movements intact.     Pupils: Pupils are equal, round, and reactive to light.  Skin:    General: Skin is warm and dry.     Coloration: Skin is not jaundiced or pale.     Findings: No rash.     Comments: Chest wall: Right chest wall medial to the right areola with mild redness, swelling has resolved, cystic mass still present.  No fluctuance present.    Neurological:     Mental Status: He is alert and oriented to person, place, and time. Mental status is at baseline.  Psychiatric:        Mood and Affect: Mood normal.        Behavior: Behavior normal.        Thought Content: Thought content normal.        Judgment: Judgment normal.      No results found. No results found. No results found for this or any previous visit (from the past 24 hours).  Assessment/Plan; [name] is a 68 y.o. male present for OV for  Abscess of chest wall (Primary)/epidermal cyst Improving - Continue doxycycline  twice daily x 10 days -Daily wash with warm water and Hibiclens twice daily -Apply Bactroban  ointment twice daily after cleansing and keep area covered -Follow-up as needed; explained to patient cyst will remain, infection, redness and drainage should resolve.  If desiring to have cyst removed would refer to surgery.   Reviewed expectations re: course of current medical issues. Discussed  self-management of symptoms. Outlined signs and symptoms indicating need for more acute intervention. Patient verbalized understanding and all questions were answered. Patient received an After-Visit Summary.    No orders of the defined types were placed in this encounter.  Meds ordered this encounter  Medications   omega-3 acid ethyl esters (LOVAZA ) 1 g capsule    Sig: Take 1 capsule (1 g total) by mouth daily.    Dispense:  90 capsule    Refill:  3    Requesting 1 year supply   Referral Orders  No referral(s) requested today     Note is dictated utilizing voice recognition software. Although note has been proof read prior to signing, occasional typographical errors still can be missed. If any questions arise, please do not hesitate to call for verification.   electronically signed by:  Napolean Backbone, DO  [facility] - [place]

## 2024-05-08 ENCOUNTER — Telehealth: Payer: Self-pay | Admitting: Family Medicine

## 2024-05-08 NOTE — Telephone Encounter (Signed)
 Copied from CRM [id number]. Topic: Clinical - Medication Refill >> May 08, 2024  1:48 PM Marlan Silva wrote: Medication:  ULTICARE PEN NEEDLES 31G X 5 MM MISC  ONETOUCH VERIO test strip Accu-Chek FastClix Lancets MISC  Has the patient contacted their pharmacy? Yes (Agent: If no, request that the patient contact the pharmacy for the refill. If patient does not wish to contact the pharmacy document the reason why and proceed with request.) (Agent: If yes, when and what did the pharmacy advise?)  This is the patient's preferred pharmacy:  [facility], [place] - [address] [place] [address] Phone: [phone] Fax: [phone]  Is this the correct pharmacy for this prescription? Yes If no, delete pharmacy and type the correct one.   Has the prescription been filled recently? Yes  Is the patient out of the medication? Yes  Has the patient been seen for an appointment in the last year OR does the patient have an upcoming appointment? Yes  Can we respond through MyChart? Yes  Agent: Please be advised that Rx refills may take up to 3 business days. We ask that you follow-up with your pharmacy.

## 2024-05-15 ENCOUNTER — Telehealth: Payer: Self-pay

## 2024-05-15 NOTE — Telephone Encounter (Signed)
 Communication  Reason for CRM: US  Med called to check on status of order for Dexcom G7 that was faxed over on 05/08/2024. Ask yet.Stated that they have not received order back yet.        Office [phone]   LVM to discuss.

## 2024-05-23 ENCOUNTER — Telehealth: Payer: Self-pay | Admitting: Family Medicine

## 2024-05-23 NOTE — Telephone Encounter (Signed)
 Received paper/fax request for Dexcom 7 Diagnostic code E11.69, E78.5 Signed  CMA to provide the additional requested documentation on form, and return fax

## 2024-06-02 DIAGNOSIS — E118 Type 2 diabetes mellitus with unspecified complications: Secondary | ICD-10-CM | POA: Diagnosis not present

## 2024-06-24 ENCOUNTER — Encounter: Payer: Self-pay | Admitting: Family Medicine

## 2024-07-02 ENCOUNTER — Encounter: Payer: Self-pay | Admitting: Family Medicine

## 2024-07-02 ENCOUNTER — Ambulatory Visit: Admitting: Family Medicine

## 2024-07-02 VITALS — BP 108/63 | HR 51 | Temp 97.6°F | Ht 71.0 in | Wt 214.8 lb

## 2024-07-02 DIAGNOSIS — E119 Type 2 diabetes mellitus without complications: Secondary | ICD-10-CM | POA: Insufficient documentation

## 2024-07-02 DIAGNOSIS — E785 Hyperlipidemia, unspecified: Secondary | ICD-10-CM | POA: Diagnosis not present

## 2024-07-02 DIAGNOSIS — E1142 Type 2 diabetes mellitus with diabetic polyneuropathy: Secondary | ICD-10-CM

## 2024-07-02 DIAGNOSIS — I10 Essential (primary) hypertension: Secondary | ICD-10-CM | POA: Diagnosis not present

## 2024-07-02 DIAGNOSIS — I77810 Thoracic aortic ectasia: Secondary | ICD-10-CM | POA: Diagnosis not present

## 2024-07-02 DIAGNOSIS — I2510 Atherosclerotic heart disease of native coronary artery without angina pectoris: Secondary | ICD-10-CM | POA: Diagnosis not present

## 2024-07-02 DIAGNOSIS — F411 Generalized anxiety disorder: Secondary | ICD-10-CM | POA: Diagnosis not present

## 2024-07-02 DIAGNOSIS — E559 Vitamin D deficiency, unspecified: Secondary | ICD-10-CM | POA: Diagnosis not present

## 2024-07-02 DIAGNOSIS — I350 Nonrheumatic aortic (valve) stenosis: Secondary | ICD-10-CM

## 2024-07-02 DIAGNOSIS — E113293 Type 2 diabetes mellitus with mild nonproliferative diabetic retinopathy without macular edema, bilateral: Secondary | ICD-10-CM

## 2024-07-02 DIAGNOSIS — E1169 Type 2 diabetes mellitus with other specified complication: Secondary | ICD-10-CM | POA: Diagnosis not present

## 2024-07-02 DIAGNOSIS — Z955 Presence of coronary angioplasty implant and graft: Secondary | ICD-10-CM

## 2024-07-02 DIAGNOSIS — F5101 Primary insomnia: Secondary | ICD-10-CM | POA: Diagnosis not present

## 2024-07-02 DIAGNOSIS — Z794 Long term (current) use of insulin: Secondary | ICD-10-CM | POA: Diagnosis not present

## 2024-07-02 LAB — POCT GLYCOSYLATED HEMOGLOBIN (HGB A1C)
HbA1c POC (<> result, manual entry): 7.5 % (ref 4.0–5.6)
HbA1c, POC (controlled diabetic range): 7.5 % — AB (ref 0.0–7.0)
HbA1c, POC (prediabetic range): 7.5 % — AB (ref 5.7–6.4)
Hemoglobin A1C: 7.5 % — AB (ref 4.0–5.6)

## 2024-07-02 MED ORDER — OMEGA-3-ACID ETHYL ESTERS 1 G PO CAPS: 1.0000 | ORAL_CAPSULE | Freq: Every day | ORAL | 3 refills | Status: AC

## 2024-07-02 MED ORDER — DAPAGLIFLOZIN PROPANEDIOL 10 MG PO TABS: ORAL_TABLET | ORAL | 1 refills | Status: DC

## 2024-07-02 MED ORDER — LANTUS SOLOSTAR 100 UNIT/ML ~~LOC~~ SOPN: 70.0000 [IU] | PEN_INJECTOR | Freq: Every day | SUBCUTANEOUS | 5 refills | Status: DC

## 2024-07-02 MED ORDER — TRAZODONE HCL 150 MG PO TABS: 150.0000 mg | ORAL_TABLET | Freq: Every day | ORAL | 1 refills | Status: DC

## 2024-07-02 MED ORDER — ROSUVASTATIN CALCIUM 40 MG PO TABS: 40.0000 mg | ORAL_TABLET | Freq: Every day | ORAL | 1 refills | Status: DC

## 2024-07-02 MED ORDER — LISINOPRIL 20 MG PO TABS: 20.0000 mg | ORAL_TABLET | Freq: Every day | ORAL | 1 refills | Status: DC

## 2024-07-02 MED ORDER — DEXCOM G7 RECEIVER DEVI: 0 refills | Status: AC

## 2024-07-02 MED ORDER — DIAZEPAM 5 MG PO TABS: 5.0000 mg | ORAL_TABLET | Freq: Four times a day (QID) | ORAL | 5 refills | Status: DC | PRN

## 2024-07-02 MED ORDER — JANUMET XR 100-1000 MG PO TB24: 1.0000 | ORAL_TABLET | Freq: Every day | ORAL | 1 refills | Status: DC

## 2024-07-02 NOTE — Progress Notes (Signed)
 [name] , 20-Sep-1956, 68 y.o., male MRN: [id number] Patient Care Team    Relationship Specialty Notifications Start End  Catherine Charlies LABOR, DO PCP - General Family Medicine  11/29/18   Valdemar Rogue, MD Consulting Physician Ophthalmology  01/09/19   Alvan Dorn FALCON, MD Consulting Physician Cardiology  07/19/23     Chief Complaint  Patient presents with   Diabetes    Chronic Conditions/illness Management      Subjective:[name] is a 68 y.o.  Pt presents for an OV for Chronic Conditions/illness Management  Diabetes with complications/overweight:  Patient reports he has been a diabetic since 1998.   He reports with compliance w/  Janumet /Metformin  1000-100 mg daily, Farxiga  10 mg daily and Lantus  70 units daily. Patient denies dizziness, hyperglycemic or hypoglycemic events. Patient denies numbness, tingling in the extremities or nonhealing wounds of feet.   Depression/anxiety/insomnia: Patient reports he is feeling off since a bee sting last week. Not sleeping well and getting scared again.compliant with Valium  4 times daily and trazodone  100 mg nightly.   Prior note He had been on BuSpar  as well which he originally thought was helpful, but then he felt that it was not as helpful.   [place]  controlled substance database reviewed today Original note: He states he has a rather routine day.  He does not have much social contact in a day.  He lives by himself.  He reports that he always feels like he stays on edge.  He reports when he wakes up he is already feeling on edge and he will take a Valium  at that time.  He then has breakfast and goes on a 4 mile walk daily.  For lunch he starts to feel his anxiety sudden and he becomes panicked.  He states when he feels this way he will break out in a sweat, become very fearful and scared.  He states he commonly fears there is someone in the house with a gun.  He goes to his bedroom gets in his bed. He states the fear is  so intense and he just cannot control it.  He denies any known trauma or event experienced in his life that would create such a fear.  He states he did go see a counselor/psychiatrist at [facility] in the past 6 months, and he did not feel that was helpful for him.  Denies any SI or HI.  Hypertension/CAD/S/p Stent/prior MI:  Pt has a significant cardiac history of anterior MI in 2004 with stents was LAD and then stents to branch vessel 2008 (by DR. Renaldo-cardio note 2015)  Patient reports compliance with lisinopril  20 mg daily,, Lovaza , and Crestor .  His statin was increased by cardiology to Crestor  40 mg daily.  Patient denies chest pain, shortness of breath, dizziness or lower extremity edema.   Diet: Low-sodium Exercise: Routine exercise daily RF: Hypertension, hyperlipidemia, diabetes, overweight, family history, CAD, MI   Neck pain/back pain: pt reports back pain is about the same. The naproxen  twice daily as needed is helpful, especially during tractor season Prior note: Patient has had recurrent posterior neck pain and headache.  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  He states he found an old prescription of naproxen  and restarted it the last time he had symptoms.  He states it worked well for him.  He is wondering if we could get refills for him today.  In the  past he has stated he could not tolerate NSAIDs.     Erectile dysfunction: (Documentation only) patient would like to start a medication for erectile dysfunction.  He states he seen a urologist many years ago and does not desire to see a urologist again.  Patient has a significant medical history of blood pressure with systolic less than 100 and history of CAD and MI.  Echo 06/08/2021: EF 50-55%.  LVH.  Moderate calcification of aortic valve.  echo 03/06/2020:  1. Technically difficult study. Left ventricular ejection fraction, by  estimation, is 45 to 50%. The left ventricle has  grossly mildly decreased  function. Image quality is not sufficient to assess for regional wall  motion abnormalities. Left ventricular   diastolic parameters are indeterminate.   2. Right ventricle is poorly visualized. Grossly normal size and systolic  function. Tricuspid regurgitation signal is inadequate for assessing PA  pressure.   3. The mitral valve is normal in structure. No evidence of mitral valve  regurgitation.   4. Aortic dilatation noted. There is mild dilatation of the ascending  aorta measuring 37 mm.   5. The aortic valve is abnormal. Moderately calcified. Aortic valve  regurgitation is not visualized. Mild aortic valve stenosis. Vmax 2.2 m/s,  MG 10 mmHg, AVA 1.7 cm^2, DI 0.4      02/27/2024    1:46 PM 08/21/2023    1:04 PM 03/24/2023    1:35 PM 12/09/2022    1:43 PM 12/05/2022    9:54 AM  Depression screen PHQ 2/9  Decreased Interest 0 0 0 2 1  Down, Depressed, Hopeless 0 0 0 0 0  PHQ - 2 Score 0 0 0 2 1  Altered sleeping 0   0   Tired, decreased energy 2   2   Change in appetite 2   3   Feeling bad or failure about yourself  0   0   Trouble concentrating 0   2   Moving slowly or fidgety/restless 0   0   Suicidal thoughts 0   0   PHQ-9 Score 4   9   Difficult doing work/chores Not difficult at all        Allergies  Allergen Reactions   Gabapentin  Other (See Comments)    Paranoia/hallucinations   Hydrocodone  Other (See Comments)    PT reports  Confusion when tacking Hydrocodone    Nsaids     History of heart disease, CAD with history of MI, CABG status post stent   Prednisone     Paranoia   Tramadol     Penicillins Other (See Comments)    Childhood unsure if still allgeric   Social History   Social History Narrative   Marital status/children/pets: Single   Education/employment: HS grad, 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:      -smoke alarm in the home:Yes     - wears seatbelt: Yes      Past Medical History:  Diagnosis Date   Alcohol addiction  (HCC)    Anxiety    Arthritis    Bulging of lumbar intervertebral disc 2017   MRI: L3-4, L4-5 and L5-S1- facet hypertrophy and formainal stenosis   CAD (coronary artery disease)    Coronary artery disease with hx of myocardial infarct w/o hx of CABG 2004   Depression    Dextrocardia    Diabetes (HCC)    Heart attack (HCC) 2004   Stent placement 2008   Hyperlipidemia    Hypertension    Insomnia  Past Surgical History:  Procedure Laterality Date   CORONARY ANGIOPLASTY WITH STENT PLACEMENT  2008   Family History  Problem Relation Age of Onset   Heart attack Mother    Heart disease Mother    Diabetes Mother    Arthritis Mother    Hyperlipidemia Mother    Hypertension Mother    Breast cancer Mother    Heart attack Father    Heart disease Father    Alcohol abuse Father    Early death Father    Arthritis Sister    Heart disease Sister    Hyperlipidemia Sister    Asthma Sister    COPD Sister    Diabetes Sister    Alcohol abuse Sister    Allergies as of 07/02/2024       Reactions   Gabapentin  Other (See Comments)   Paranoia/hallucinations   Hydrocodone  Other (See Comments)   PT reports  Confusion when tacking Hydrocodone    Nsaids    History of heart disease, CAD with history of MI, CABG status post stent   Prednisone    Paranoia   Tramadol     Penicillins Other (See Comments)   Childhood unsure if still allgeric        Medication List        Accurate as of July 02, 2024  2:04 PM. If you have any questions, ask your nurse or doctor.          STOP taking these medications    doxycycline  100 MG tablet Commonly known as: VIBRA -TABS Stopped by: Charlies Bellini   mupirocin  ointment 2 % Commonly known as: BACTROBAN  Stopped by: Charlies Bellini   OneTouch Verio test strip Generic drug: glucose blood Stopped by: Charlies Bellini   UltiCare Pen Needles 31G X 5 MM Misc Generic drug: Insulin Pen Needle Stopped by: Charlies Bellini       TAKE these medications     Accu-Chek FastClix Lancets Misc   aspirin  EC 81 MG tablet Take 1 tablet (81 mg total) by mouth daily.   dapagliflozin  propanediol 10 MG Tabs tablet Commonly known as: Farxiga  TAKE 1 TABLET BY MOUTH  DAILY BEFORE BREAKFAST   Dexcom G7 Receiver Devi Monitor glucose levels 3x daily for IDDM Started by: Charlies Bellini   Dexcom G7 Sensor Misc   diazepam  5 MG tablet Commonly known as: VALIUM  Take 1 tablet (5 mg total) by mouth every 6 (six) hours as needed for anxiety.   Janumet  XR [phone] MG Tb24 Generic drug: SitaGLIPtin -MetFORMIN  HCl Take 1 tablet by mouth daily.   Lantus  SoloStar 100 UNIT/ML Solostar Pen Generic drug: insulin glargine  Inject 70 Units into the skin daily.   lisinopril  20 MG tablet Commonly known as: ZESTRIL  Take 1 tablet (20 mg total) by mouth daily.   naproxen  500 MG tablet Commonly known as: NAPROSYN  TAKE 1 TABLET BY MOUTH EVERY 12 HOURS W/FOOD AS NEEDED ONLY *NEW PRESCRIPTION REQUEST*   omega-3 acid ethyl esters 1 g capsule Commonly known as: LOVAZA  Take 1 capsule (1 g total) by mouth daily.   rosuvastatin  40 MG tablet Commonly known as: CRESTOR  Take 1 tablet (40 mg total) by mouth at bedtime.   traZODone  150 MG tablet Commonly known as: DESYREL  Take 1 tablet (150 mg total) by mouth at bedtime. What changed:  medication strength how much to take when to take this reasons to take this Changed by: Charlies Bellini        All past medical history, surgical history, allergies, family history, immunizations andmedications were  updated in the EMR today and reviewed under the history and medication portions of their EMR.     ROS: Negative, with the exception of above mentioned in HPI   Objective:  BP 108/63   Pulse (!) 51   Temp 97.6 F (36.4 C) (Oral)   Ht 5' 11 (1.803 m)   Wt 214 lb 12.8 oz (97.4 kg)   SpO2 96%   BMI 29.96 kg/m  Body mass index is 29.96 kg/m. Physical Exam Vitals and nursing note reviewed.  Constitutional:       General: He is not in acute distress.    Appearance: Normal appearance. He is not ill-appearing, toxic-appearing or diaphoretic.  HENT:     Head: Normocephalic and atraumatic.  Eyes:     General: No scleral icterus.       Right eye: No discharge.        Left eye: No discharge.     Extraocular Movements: Extraocular movements intact.     Pupils: Pupils are equal, round, and reactive to light.  Cardiovascular:     Rate and Rhythm: Normal rate and regular rhythm.     Heart sounds: Murmur heard.  Pulmonary:     Effort: Pulmonary effort is normal. No respiratory distress.     Breath sounds: Normal breath sounds. No wheezing, rhonchi or rales.  Musculoskeletal:     Right lower leg: No edema.     Left lower leg: No edema.  Skin:    General: Skin is warm.     Findings: No rash.  Neurological:     Mental Status: He is alert and oriented to person, place, and time. Mental status is at baseline.  Psychiatric:        Mood and Affect: Mood normal.        Behavior: Behavior normal.        Thought Content: Thought content normal.        Judgment: Judgment normal.     Diabetic Foot Exam - Simple   Simple Foot Form Diabetic Foot exam was performed with the following findings: Yes 07/02/2024  1:19 PM  Visual Inspection No deformities, no ulcerations, no other skin breakdown bilaterally: Yes Sensation Testing Intact to touch and monofilament testing bilaterally: Yes Pulse Check Posterior Tibialis and Dorsalis pulse intact bilaterally: Yes Comments     No results found. No results found. Results for orders placed or performed in visit on 07/02/24 (from the past 24 hours)  POCT glycosylated hemoglobin (Hb A1C)     Status: Abnormal   Collection Time: 07/02/24  1:08 PM  Result Value Ref Range   Hemoglobin A1C 7.5 (A) 4.0 - 5.6 %   HbA1c POC (<> result, manual entry) 7.5 4.0 - 5.6 %   HbA1c, POC (prediabetic range) 7.5 (A) 5.7 - 6.4 %   HbA1c, POC (controlled diabetic range) 7.5 (A) 0.0 -  7.0 %       Assessment/Plan: [name] is a 68 y.o. male present for OV for Chronic Conditions/illness Management Dyslipidemia associated with type 2 diabetes mellitus (HCC)/diabetic peripheral neuropathy/mild nonproliferative diabetic retinopathy of both eyes/overweight:  Stable Continue  janumet  [phone] daily continue farixga 10 mg daily Continue Lantus  70 units PNA series: Completed after 63 Microalbumin collected today Flu shot: UTD 2024 Foot exam: UTD 02/15/2024> podiatry est-had callus removed last week Eye exam:  Completed UTD 07/13/2022- eye clinic-[facility] A1c:10.4>>>9.4>>  9.7>>11.8>> 8.5 >>> 6.5 >>>7.1>7.6>7.7>8.7> 8.8 on 04/20/2021>8.3 > 8.7 > 7.4 > 7.6> 8.2 > 6.8> 7.0>9.0> 7.2 >  7.5 today Discussed diet- the cereal he was eating was higher sugar. He will cut back   Anxiety/depression/insomnia: Difficulty sleeping since bee sting and benadryl use. Has a history of significant depression with anxiety and paranoid features. Continue  Valium  4 times daily increase  trazodone  to 150 mg daily Meds tried in the past: BuSpar  patient reports it was not effective.  Paxil  patient reports made him more anxious. -    controlled substance database reviewed today  Patient aware he needs a face-to-face appointment in order for any refills to be processed -Contract signed -Offered referral to counseling or psychiatry; he declined.    Hypertension/CAD/S/p Stent/prior MI/hypercholesteremia/aortic stenosis/aortic dilatation/LVH/bradycardia:  Pt has a significant cardiac history of anterior MI in 2004 with stents was LAD and then stents to a branch vessel 2008 (by DR. Renaldo- cardio note 2015) Conitnue  lisinopril  to 20 mg.  Continue aspirin  81 mg daily.   Cardiology discontinued Plavix   Continue  Crestor  40 mg daily Diet: Low-sodium Exercise: Routine exercise daily - REFERRED BACK TO CARDIO AGAIN   Reviewed expectations re: course of current medical  issues. Discussed self-management of symptoms. Outlined signs and symptoms indicating need for more acute intervention. Patient verbalized understanding and all questions were answered. Patient received an After-Visit Summary.    Orders Placed This Encounter  Procedures   Ambulatory referral to Cardiology   POCT glycosylated hemoglobin (Hb A1C)    Meds ordered this encounter  Medications   insulin glargine  (LANTUS  SOLOSTAR) 100 UNIT/ML Solostar Pen    Sig: Inject 70 Units into the skin daily.    Dispense:  60 mL    Refill:  5    USE THIS SCRIPT PLEASE!!! DC ALL THE other scripts please.   omega-3 acid ethyl esters (LOVAZA ) 1 g capsule    Sig: Take 1 capsule (1 g total) by mouth daily.    Dispense:  90 capsule    Refill:  3    Requesting 1 year supply   traZODone  (DESYREL ) 150 MG tablet    Sig: Take 1 tablet (150 mg total) by mouth at bedtime.    Dispense:  90 tablet    Refill:  1   rosuvastatin  (CRESTOR ) 40 MG tablet    Sig: Take 1 tablet (40 mg total) by mouth at bedtime.    Dispense:  90 tablet    Refill:  1   lisinopril  (ZESTRIL ) 20 MG tablet    Sig: Take 1 tablet (20 mg total) by mouth daily.    Dispense:  90 tablet    Refill:  1   JANUMET  XR [phone] MG TB24    Sig: Take 1 tablet by mouth daily.    Dispense:  90 tablet    Refill:  1   dapagliflozin  propanediol (FARXIGA ) 10 MG TABS tablet    Sig: TAKE 1 TABLET BY MOUTH  DAILY BEFORE BREAKFAST    Dispense:  90 tablet    Refill:  1    Requesting 1 year supply> no   diazepam  (VALIUM ) 5 MG tablet    Sig: Take 1 tablet (5 mg total) by mouth every 6 (six) hours as needed for anxiety.    Dispense:  120 tablet    Refill:  5   Continuous Glucose Receiver (DEXCOM G7 RECEIVER) DEVI    Sig: Monitor glucose levels 3x daily for IDDM    Dispense:  1 each    Refill:  0    Needs new- his reciever was part o the recall  Referral Orders         Ambulatory referral to Cardiology        Note is dictated utilizing  voice recognition software. Although note has been proof read prior to signing, occasional typographical errors still can be missed. If any questions arise, please do not hesitate to call for verification.   electronically signed by:  Charlies Bellini, DO  [facility] [place]

## 2024-07-02 NOTE — Patient Instructions (Signed)
 Return in about 15 weeks (around 10/15/2024) for Routine chronic condition follow-up.        Great to see you today.  I have refilled the medication(s) we provide.   If labs were collected or images ordered, we will inform you of  results once we have received them and reviewed. We will contact you either by echart message, or telephone call.  Please give ample time to the testing facility, and our office to run,  receive and review results. Please do not call inquiring of results, even if you can see them in your chart. We will contact you as soon as we are able. If it has been over 1 week since the test was completed, and you have not yet heard from us , then please call us .    - echart message- for normal results that have been seen by the patient already.   - telephone call: abnormal results or if patient has not viewed results in their echart.  If a referral to a specialist was entered for you, please call us  in 2 weeks if you have not heard from the specialist office to schedule.

## 2024-07-03 DIAGNOSIS — E118 Type 2 diabetes mellitus with unspecified complications: Secondary | ICD-10-CM | POA: Diagnosis not present

## 2024-07-08 ENCOUNTER — Other Ambulatory Visit (HOSPITAL_COMMUNITY): Payer: Self-pay

## 2024-07-08 ENCOUNTER — Telehealth: Payer: Self-pay

## 2024-07-08 NOTE — Telephone Encounter (Signed)
 Pharmacy Patient Advocate Encounter   Received notification from Onbase that prior authorization for Dexcom G7 Receiver device  is required/requested.   Insurance verification completed.   The patient is insured through [place] .   Per test claim: PA required; PA submitted to above mentioned insurance via Latent Key/confirmation #/EOC [id number] Status is pending

## 2024-07-08 NOTE — Telephone Encounter (Signed)
 Communication  Reason for CRM: patient is requesting a phone call with the information for a previous doctor that he has seen for carpal tunnel surgery done at he is trying to get back in with him for his back.    LVM. Sent MyChart.

## 2024-07-10 ENCOUNTER — Other Ambulatory Visit (HOSPITAL_COMMUNITY): Payer: Self-pay

## 2024-07-10 NOTE — Telephone Encounter (Signed)
 Pharmacy Patient Advocate Encounter  Received notification from [facility] that Prior Authorization for Dexcom G7 Receiver device  has been APPROVED from 07/10/24 to 11/27/24 with QUANTITY LIMIT of 1 for a 365 day supply.   PA #/Case ID/Reference #: [id number]  *spoke with pharmacy, they are going to contact patient to see if he needs a new one. Insurance will not pay again until 10/01/24.

## 2024-07-19 ENCOUNTER — Ambulatory Visit: Payer: Self-pay

## 2024-07-19 ENCOUNTER — Emergency Department (HOSPITAL_COMMUNITY)
Admission: EM | Admit: 2024-07-19 | Discharge: 2024-07-19 | Disposition: A | Discharge status: Home / Self Care | Attending: Emergency Medicine | Admitting: Emergency Medicine

## 2024-07-19 ENCOUNTER — Other Ambulatory Visit: Payer: Self-pay

## 2024-07-19 DIAGNOSIS — I10 Essential (primary) hypertension: Secondary | ICD-10-CM | POA: Diagnosis not present

## 2024-07-19 DIAGNOSIS — Z794 Long term (current) use of insulin: Secondary | ICD-10-CM | POA: Insufficient documentation

## 2024-07-19 DIAGNOSIS — Z7982 Long term (current) use of aspirin: Secondary | ICD-10-CM | POA: Diagnosis not present

## 2024-07-19 DIAGNOSIS — F419 Anxiety disorder, unspecified: Secondary | ICD-10-CM | POA: Insufficient documentation

## 2024-07-19 LAB — CBC WITH DIFFERENTIAL/PLATELET
Abs Immature Granulocytes: 0.03 K/uL (ref 0.00–0.07)
Basophils Absolute: 0 K/uL (ref 0.0–0.1)
Basophils Relative: 1 %
Eosinophils Absolute: 0.1 K/uL (ref 0.0–0.5)
Eosinophils Relative: 2 %
HCT: 48.1 % (ref 39.0–52.0)
Hemoglobin: 15.8 g/dL (ref 13.0–17.0)
Immature Granulocytes: 1 %
Lymphocytes Relative: 23 %
Lymphs Abs: 1.4 K/uL (ref 0.7–4.0)
MCH: 30.8 pg (ref 26.0–34.0)
MCHC: 32.8 g/dL (ref 30.0–36.0)
MCV: 93.8 fL (ref 80.0–100.0)
Monocytes Absolute: 0.7 K/uL (ref 0.1–1.0)
Monocytes Relative: 12 %
Neutro Abs: 3.9 K/uL (ref 1.7–7.7)
Neutrophils Relative %: 61 %
Platelets: 157 K/uL (ref 150–400)
RBC: 5.13 MIL/uL (ref 4.22–5.81)
RDW: 13.2 % (ref 11.5–15.5)
WBC: 6.2 K/uL (ref 4.0–10.5)
nRBC: 0 % (ref 0.0–0.2)

## 2024-07-19 LAB — BASIC METABOLIC PANEL WITH GFR
Anion gap: 13 (ref 5–15)
BUN: 26 mg/dL — ABNORMAL HIGH (ref 8–23)
CO2: 22 mmol/L (ref 22–32)
Calcium: 9.3 mg/dL (ref 8.9–10.3)
Chloride: 104 mmol/L (ref 98–111)
Creatinine, Ser: 0.7 mg/dL (ref 0.61–1.24)
GFR, Estimated: >60 mL/min (ref >60)
Glucose, Bld: 156 mg/dL — ABNORMAL HIGH (ref 70–99)
Potassium: 4 mmol/L (ref 3.5–5.1)
Sodium: 139 mmol/L (ref 135–145)

## 2024-07-19 MED ORDER — LORAZEPAM 2 MG/ML IJ SOLN
1.0000 mg | Freq: Once | INTRAMUSCULAR | Status: AC
  Administered 2024-07-19: 1 mg via INTRAMUSCULAR
  Filled 2024-07-19: qty 1

## 2024-07-19 MED ORDER — BUSPIRONE HCL 5 MG PO TABS
10.0000 mg | ORAL_TABLET | Freq: Once | ORAL | Status: AC
  Administered 2024-07-19: 10 mg via ORAL
  Filled 2024-07-19: qty 2

## 2024-07-19 MED ORDER — HYDROXYZINE HCL 50 MG/ML IM SOLN
100.0000 mg | Freq: Once | INTRAMUSCULAR | Status: AC
  Administered 2024-07-19: 100 mg via INTRAMUSCULAR
  Filled 2024-07-19: qty 2

## 2024-07-19 MED ORDER — HYDROXYZINE HCL 25 MG PO TABS: 25.0000 mg | ORAL_TABLET | Freq: Four times a day (QID) | ORAL | 0 refills | Status: DC | PRN

## 2024-07-19 MED ORDER — BUSPIRONE HCL 15 MG PO TABS: 15.0000 mg | ORAL_TABLET | Freq: Three times a day (TID) | ORAL | 0 refills | Status: DC

## 2024-07-19 NOTE — ED Notes (Signed)
 Pt/family received d/c paperwork at this time. After going over the paperwork any questions, comments, or concerns were answered to the best of this nurse's knowledge. The pt/family verbally acknowledged the teachings/instructions.

## 2024-07-19 NOTE — Telephone Encounter (Signed)
 FYI Only or Action Required?: FYI only for provider.  Patient was last seen in primary care on 07/02/2024 by Catherine Fuller A, DO.  Called Nurse Triage reporting Paranoid, Hallucinations, suicidal thoughts, and interrupted sleep.  Symptoms began several days ago.  Interventions attempted: Prescription medications: diazepam  6-7x/day, trazadone and Rest, hydration, or home remedies.  Symptoms are: ebbs and flows to severe.  Triage Disposition: Go to ED Now (or PCP Triage), declined hospital for CAL nurse Chloe as well, CAL nurse Chloe scheduled appt for pt  Patient/caregiver understands and will follow disposition?: No, refuses disposition      Copied from CRM #[id number]. Topic: Clinical - Red Word Triage >> Jul 19, 2024  3:47 PM Drema MATSU wrote: Red Word that prompted transfer to Nurse Triage: Patient stated that for the for the past 3 nights when patient goes to bed. He feels as if someone is in the room trying to harm him. He is kinda paranoid and has been having panic attacks. He is taking half of diazepam  (VALIUM ) 5 MG tablet but it is not working.   ----------------------------------------------------------------------- From previous Reason for Contact - Scheduling: Patient/patient representative is calling to schedule an appointment. Refer to attachments for appointment information.  Reason for Disposition  Patient sounds very sick or weak to the triager  Answer Assessment - Initial Assessment Questions 1. MAIN CONCERN: What happened that made you call today?     Panic attack after panic attack after panic attack Can't sleep, wake up screaming and hollering, like someone in room with me trying to harm me Bad when go to bad Fall off to sleep wake up, that's when everything occurs, then I start hollering and tell whatever it is to get out of the room When in watching tv do pretty good Still not myself 2. DESCRIPTION: What are the hallucinations like? Describe them for  me. (e.g., auditory, visual, tactile; worsening; threatening) If auditory, ask Are they telling you to hurt yourself or someone else?     Go to sleep and then wake up in middle of night and feel like in different world Confirms not telling him to harm himself or others 3. ONSET: When did this start? (e.g., hours, days, months)     Past 3 nights 4. PATTERN: Do they come and go, or are they present all the time?  Are they present now?     Went to town today, not right right now like ought to be, but don't get real bad til go to sleep 5. PRIOR HALLUCINATIONS: Have you had hallucinations in the past? (e.g., same, different, worse)     Yes, worse Have had them in past but not this bad 7. MENTAL HEALTH MEDICINES: Are you taking any medicines for depression or other mental health problems? (e.g., psychiatric medicines; sleeping pills)     Taking 6-7 diazepam  a day 0.5 mg and only been prescribed for 4x/day, don't last but 2 hours at most Gave me trazadone 150 mg Started breaking them in half and quarters, nothing really helping me ma'am 10. SUPPORT: Is there anyone else with you?       No one else with him at present, Been talking to sister 52. OTHER SYMPTOMS: Are there any other symptoms? (e.g., difficulty breathing, headache, fever, weakness)       No fever, just lower back giving issues, appt 9/17, no headache or other symptoms  Asked pt if thoughts of harming or killing himself or others Don't know how to describe that Don't  think would hurt nobody Been talking to sister Don't know if I'd kill myself but feel like it sometimes A little bit I'd rather be dead than going through what I've been through the past 3 nights  Don't know if chemical imbalance or don't understand it Want to make appt with PCP Advised pt have his sister be with him Sister having health problems herself    Advised pt go to hospital or call 911 to get examined, pt declines at this time, requesting  appt with PCP. Pt is alert and oriented at present, warm transferred pt to CAL nurse. CAL nurse Chloe spoke with pt and also advised ED, pt declines, CAL nurse scheduled appt for pt for Monday with PCP, CAL nurse advised pt go to hospital if anything worse and not to wait for appt with PCP. Pt verbalized understanding.  Protocols used: Hallucinations-A-AH

## 2024-07-19 NOTE — Discharge Instructions (Signed)
 Follow-up with your family doctor next week for recheck.  You are supposed to take the BuSpar  3 times a day and you can take the Vistaril  if you need something extra.  Do not take your Valium  more than it is prescribed

## 2024-07-19 NOTE — ED Triage Notes (Signed)
 Pt is having anxiety and panic attacks and his medication is not being effective.

## 2024-07-22 ENCOUNTER — Encounter: Payer: Self-pay | Admitting: Family Medicine

## 2024-07-22 ENCOUNTER — Ambulatory Visit (INDEPENDENT_AMBULATORY_CARE_PROVIDER_SITE_OTHER): Admitting: Family Medicine

## 2024-07-22 VITALS — BP 130/76 | HR 66 | Temp 97.9°F | Wt 215.0 lb

## 2024-07-22 DIAGNOSIS — F5101 Primary insomnia: Secondary | ICD-10-CM

## 2024-07-22 DIAGNOSIS — F411 Generalized anxiety disorder: Secondary | ICD-10-CM | POA: Diagnosis not present

## 2024-07-22 DIAGNOSIS — F410 Panic disorder [episodic paroxysmal anxiety] without agoraphobia: Secondary | ICD-10-CM | POA: Insufficient documentation

## 2024-07-22 MED ORDER — CLONAZEPAM 1 MG PO TABS: 0.5000 mg | ORAL_TABLET | Freq: Three times a day (TID) | ORAL | 2 refills | Status: DC | PRN

## 2024-07-22 MED ORDER — QUETIAPINE FUMARATE 100 MG PO TABS: 100.0000 mg | ORAL_TABLET | Freq: Every day | ORAL | 1 refills | Status: DC

## 2024-07-22 NOTE — Progress Notes (Signed)
 [name] , 06/18/1956, 68 y.o., male MRN: [id number] Patient Care Team    Relationship Specialty Notifications Start End  Catherine Charlies LABOR, DO PCP - General Family Medicine  11/29/18   Valdemar Rogue, MD Consulting Physician Ophthalmology  01/09/19   Alvan Dorn FALCON, MD Consulting Physician Cardiology  07/19/23     Chief Complaint  Patient presents with   Anxiety     Subjective: [name] is a 68 y.o. Pt presents for an OV with complaints of worsening anxiety, panic attacks and hallucinations.  He was seen in the ED 2 days ago and prescribed hydroxyzine  and BuSpar .  Last visit trazodone  was increased due to patient not sleeping well at night noticing more panic at nighttime.  He reports he stopped the trazodone  altogether since it was not working.  He did try the hydroxyzine  and BuSpar  prescribed by the ED and it was not working. He reports he had a panic attack around 5 AM this morning and then again about a 11: 45 today as well today.  States he is waking up screaming turning on the lights scared so that he is in the house. He states he is not drinking any alcohol.  He reports the value is not working at all. He was provided with Ativan  0.5 mg injection in the ED and he states that calmed him down a little bit but he did have another panic attack before being discharged.  Prior note: Not sleeping well and getting scared again.compliant with Valium  4 times daily and trazodone  100 mg nightly.   Prior note He had been on BuSpar  as well which he originally thought was helpful, but then he felt that it was not as helpful.   [place]  controlled substance database reviewed today Original note: He states he has a rather routine day.  He does not have much social contact in a day.  He lives by himself.  He reports that he always feels like he stays on edge.  He reports when he wakes up he is already feeling on edge and he will take a Valium  at that time.  He then has  breakfast and goes on a 4 mile walk daily.  For lunch he starts to feel his anxiety sudden and he becomes panicked.  He states when he feels this way he will break out in a sweat, become very fearful and scared.  He states he commonly fears there is someone in the house with a gun.  He goes to his bedroom gets in his bed. He states the fear is so intense and he just cannot control it.  He denies any known trauma or event experienced in his life that would create such a fear.  He states he did go see a counselor/psychiatrist at [facility] in the past 6 months, and he did not feel that was helpful for him.  Denies any SI or HI.     02/27/2024    1:46 PM 08/21/2023    1:04 PM 03/24/2023    1:35 PM 12/09/2022    1:43 PM 12/05/2022    9:54 AM  Depression screen PHQ 2/9  Decreased Interest 0 0 0 2 1  Down, Depressed, Hopeless 0 0 0 0 0  PHQ - 2 Score 0 0 0 2 1  Altered sleeping 0   0   Tired, decreased energy 2   2   Change in appetite 2   3   Feeling bad  or failure about yourself  0   0   Trouble concentrating 0   2   Moving slowly or fidgety/restless 0   0   Suicidal thoughts 0   0   PHQ-9 Score 4   9   Difficult doing work/chores Not difficult at all          02/27/2024    1:46 PM 12/09/2022    1:44 PM 04/24/2019    8:44 AM 02/12/2019    8:40 AM  GAD 7 : Generalized Anxiety Score  Nervous, Anxious, on Edge 0 2 3 3   Control/stop worrying 0 2 0 0  Worry too much - different things 0 2 0 0  Trouble relaxing 0 2 2 0  Restless 0 0 2 1  Easily annoyed or irritable 0 0 0 0  Afraid - awful might happen 0 0 3 3  Total GAD 7 Score 0 8 10 7   Anxiety Difficulty Not difficult at all  Somewhat difficult Not difficult at all     Allergies  Allergen Reactions   Gabapentin  Other (See Comments)    Paranoia/hallucinations   Hydrocodone  Other (See Comments)    PT reports  Confusion when tacking Hydrocodone    Nsaids     History of heart disease, CAD with history of MI, CABG status post stent   Prednisone      Paranoia   Tramadol     Penicillins Other (See Comments)    Childhood unsure if still allgeric   Social History   Social History Narrative   Marital status/children/pets: Single   Education/employment: HS grad, 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:      -smoke alarm in the home:Yes     - wears seatbelt: Yes      Past Medical History:  Diagnosis Date   Alcohol addiction (HCC)    Anxiety    Arthritis    Bulging of lumbar intervertebral disc 2017   MRI: L3-4, L4-5 and L5-S1- facet hypertrophy and formainal stenosis   CAD (coronary artery disease)    Coronary artery disease with hx of myocardial infarct w/o hx of CABG 2004   Depression    Dextrocardia    Diabetes (HCC)    Heart attack (HCC) 2004   Stent placement 2008   Hyperlipidemia    Hypertension    Insomnia    Past Surgical History:  Procedure Laterality Date   CORONARY ANGIOPLASTY WITH STENT PLACEMENT  2008   Family History  Problem Relation Age of Onset   Heart attack Mother    Heart disease Mother    Diabetes Mother    Arthritis Mother    Hyperlipidemia Mother    Hypertension Mother    Breast cancer Mother    Heart attack Father    Heart disease Father    Alcohol abuse Father    Early death Father    Arthritis Sister    Heart disease Sister    Hyperlipidemia Sister    Asthma Sister    COPD Sister    Diabetes Sister    Alcohol abuse Sister    Allergies as of 07/22/2024       Reactions   Gabapentin  Other (See Comments)   Paranoia/hallucinations   Hydrocodone  Other (See Comments)   PT reports  Confusion when tacking Hydrocodone    Nsaids    History of heart disease, CAD with history of MI, CABG status post stent   Prednisone    Paranoia   Tramadol     Penicillins Other (See Comments)  Childhood unsure if still allgeric        Medication List        Accurate as of July 22, 2024  3:05 PM. If you have any questions, ask your nurse or doctor.          STOP taking these medications     busPIRone  15 MG tablet Commonly known as: BUSPAR  Stopped by: Charlies Bellini   diazepam  5 MG tablet Commonly known as: VALIUM  Stopped by: Charlies Bellini   hydrOXYzine  25 MG tablet Commonly known as: ATARAX  Stopped by: Charlies Bellini   traZODone  150 MG tablet Commonly known as: DESYREL  Stopped by: Charlies Bellini       TAKE these medications    Accu-Chek FastClix Lancets Misc   aspirin  EC 81 MG tablet Take 1 tablet (81 mg total) by mouth daily.   clonazePAM  1 MG tablet Commonly known as: KLONOPIN  Take 0.5-1 tablets (0.5-1 mg total) by mouth 3 (three) times daily as needed for anxiety. Started by: Evelena Masci   dapagliflozin  propanediol 10 MG Tabs tablet Commonly known as: Farxiga  TAKE 1 TABLET BY MOUTH  DAILY BEFORE BREAKFAST   Dexcom G7 Receiver Devi Monitor glucose levels 3x daily for IDDM   Dexcom G7 Sensor Misc   Janumet  XR [phone] MG Tb24 Generic drug: SitaGLIPtin -MetFORMIN  HCl Take 1 tablet by mouth daily.   Lantus  SoloStar 100 UNIT/ML Solostar Pen Generic drug: insulin glargine  Inject 70 Units into the skin daily.   lisinopril  20 MG tablet Commonly known as: ZESTRIL  Take 1 tablet (20 mg total) by mouth daily.   naproxen  500 MG tablet Commonly known as: NAPROSYN  TAKE 1 TABLET BY MOUTH EVERY 12 HOURS W/FOOD AS NEEDED ONLY *NEW PRESCRIPTION REQUEST*   omega-3 acid ethyl esters 1 g capsule Commonly known as: LOVAZA  Take 1 capsule (1 g total) by mouth daily.   QUEtiapine  100 MG tablet Commonly known as: SEROQUEL  Take 1 tablet (100 mg total) by mouth at bedtime. Started by: Charlies Bellini   rosuvastatin  40 MG tablet Commonly known as: CRESTOR  Take 1 tablet (40 mg total) by mouth at bedtime.        All past medical history, surgical history, allergies, family history, immunizations andmedications were updated in the EMR today and reviewed under the history and medication portions of their EMR.     ROS Negative, with the exception of above mentioned  in HPI   Objective:  BP 130/76   Pulse 66   Temp 97.9 F (36.6 C)   Wt 215 lb (97.5 kg)   SpO2 97%   BMI 29.99 kg/m  Body mass index is 29.99 kg/m.  Physical Exam Vitals and nursing note reviewed. Exam conducted with a chaperone present.  Constitutional:      General: He is not in acute distress.    Appearance: Normal appearance. He is not ill-appearing, toxic-appearing or diaphoretic.  HENT:     Head: Normocephalic and atraumatic.  Eyes:     General: No scleral icterus.       Right eye: No discharge.        Left eye: No discharge.     Extraocular Movements: Extraocular movements intact.     Pupils: Pupils are equal, round, and reactive to light.  Cardiovascular:     Rate and Rhythm: Normal rate and regular rhythm.     Heart sounds: No murmur heard. Pulmonary:     Effort: Pulmonary effort is normal. No respiratory distress.     Breath sounds: Normal breath sounds. No wheezing,  rhonchi or rales.  Skin:    General: Skin is warm and dry.     Coloration: Skin is not jaundiced or pale.     Findings: No rash.  Neurological:     Mental Status: He is alert and oriented to person, place, and time. Mental status is at baseline.  Psychiatric:        Mood and Affect: Mood is anxious. Mood is not depressed or elated. Affect is not flat, angry, tearful or inappropriate.        Speech: Speech normal.        Behavior: Behavior normal.        Thought Content: Thought content normal.        Cognition and Memory: Cognition and memory normal.        Judgment: Judgment normal.      No results found. No results found. No results found for this or any previous visit (from the past 24 hours).  Assessment/Plan: LAYTH CEREZO is a 68 y.o. male present for OV for  Generalized anxiety disorder (Primary)/Panic disorder DC Valium  Start Klonopin  0.5-1 mg 3 times daily as needed.  Or for panic.  [place]  controlled substance database reviewed and appropriate today.   Patient is  aware he is out to take Valium  or Vistaril  with Klonopin , nor is he to mix alcohol with Klonopin  use. Follow-up 4 weeks  Primary insomnia DC trazodone  Start Seroquel  100 mg nightly Can take Klonopin  half tab before bed as well if needed Follow-up 4 weeks can increase Seroquel  at that time if needed.   Reviewed expectations re: course of current medical issues. Discussed self-management of symptoms. Outlined signs and symptoms indicating need for more acute intervention. Patient verbalized understanding and all questions were answered. Patient received an After-Visit Summary.    No orders of the defined types were placed in this encounter.  Meds ordered this encounter  Medications   QUEtiapine  (SEROQUEL ) 100 MG tablet    Sig: Take 1 tablet (100 mg total) by mouth at bedtime.    Dispense:  90 tablet    Refill:  1   clonazePAM  (KLONOPIN ) 1 MG tablet    Sig: Take 0.5-1 tablets (0.5-1 mg total) by mouth 3 (three) times daily as needed for anxiety.    Dispense:  90 tablet    Refill:  2    DC valium    Referral Orders  No referral(s) requested today     Note is dictated utilizing voice recognition software. Although note has been proof read prior to signing, occasional typographical errors still can be missed. If any questions arise, please do not hesitate to call for verification.   electronically signed by:  Charlies Bellini, DO  [facility] - [place]

## 2024-07-22 NOTE — ED Provider Notes (Signed)
 [place] EMERGENCY DEPARTMENT AT [facility] Provider Note   CSN: [id number] Arrival date & time: 07/19/24  1735     Patient presents with: Anxiety   [name] is a 68 y.o. male.   Patient complains of anxiety.  He usually takes Valium  but is not working  The history is provided by the patient and medical records. No language interpreter was used.  Anxiety This is a chronic problem. The current episode started more than 2 days ago. The problem occurs constantly. The problem has not changed since onset.Pertinent negatives include no chest pain, no abdominal pain and no headaches. Nothing aggravates the symptoms. Nothing relieves the symptoms. He has tried nothing for the symptoms.       Prior to Admission medications   Medication Sig Start Date End Date Taking? Authorizing Provider  Accu-Chek FastClix Lancets MISC  05/11/19   [provider]  aspirin  EC 81 MG tablet Take 1 tablet (81 mg total) by mouth daily. 03/25/19   Pietro Redell RAMAN, MD  clonazePAM  (KLONOPIN ) 1 MG tablet Take 0.5-1 tablets (0.5-1 mg total) by mouth 3 (three) times daily as needed for anxiety. 07/22/24   Catherine Fuller A, DO  Continuous Glucose Receiver (DEXCOM G7 RECEIVER) DEVI Monitor glucose levels 3x daily for IDDM 07/02/24   Kuneff, Renee A, DO  Continuous Glucose Sensor (DEXCOM G7 SENSOR) MISC  04/07/24   [provider]  dapagliflozin  propanediol (FARXIGA ) 10 MG TABS tablet TAKE 1 TABLET BY MOUTH  DAILY BEFORE BREAKFAST 07/02/24   Kuneff, Renee A, DO  insulin glargine  (LANTUS  SOLOSTAR) 100 UNIT/ML Solostar Pen Inject 70 Units into the skin daily. 07/02/24 08/01/24  Kuneff, Renee A, DO  JANUMET  XR [phone] MG TB24 Take 1 tablet by mouth daily. 07/02/24   Kuneff, Renee A, DO  lisinopril  (ZESTRIL ) 20 MG tablet Take 1 tablet (20 mg total) by mouth daily. 07/02/24   Kuneff, Renee A, DO  naproxen  (NAPROSYN ) 500 MG tablet TAKE 1 TABLET BY MOUTH EVERY 12 HOURS W/FOOD AS NEEDED ONLY *NEW  PRESCRIPTION REQUEST* Patient not taking: Reported on 07/22/2024 12/07/23   Catherine Fuller A, DO  omega-3 acid ethyl esters (LOVAZA ) 1 g capsule Take 1 capsule (1 g total) by mouth daily. 07/02/24   Kuneff, Renee A, DO  QUEtiapine  (SEROQUEL ) 100 MG tablet Take 1 tablet (100 mg total) by mouth at bedtime. 07/22/24   Kuneff, Renee A, DO  rosuvastatin  (CRESTOR ) 40 MG tablet Take 1 tablet (40 mg total) by mouth at bedtime. 07/02/24   Kuneff, Renee A, DO    Allergies: Gabapentin , Hydrocodone , Nsaids, Prednisone, Tramadol , and Penicillins    Review of Systems  Constitutional:  Negative for appetite change and fatigue.  HENT:  Negative for congestion, ear discharge and sinus pressure.   Eyes:  Negative for discharge.  Respiratory:  Negative for cough.   Cardiovascular:  Negative for chest pain.  Gastrointestinal:  Negative for abdominal pain and diarrhea.  Genitourinary:  Negative for frequency and hematuria.  Musculoskeletal:  Negative for back pain.  Skin:  Negative for rash.  Neurological:  Negative for seizures and headaches.  Psychiatric/Behavioral:  Positive for agitation. Negative for hallucinations.     Updated Vital Signs BP (!) 151/69 (BP Location: Right Arm)   Pulse (!) 58   Temp 97.8 F (36.6 C) (Oral)   Resp 18   Ht 5' 11 (1.803 m)   Wt 97.1 kg   SpO2 95%   BMI 29.85 kg/m   Physical Exam Vitals  and nursing note reviewed.  Constitutional:      Appearance: He is well-developed.  HENT:     Head: Normocephalic.     Nose: Nose normal.  Eyes:     General: No scleral icterus.    Conjunctiva/sclera: Conjunctivae normal.  Neck:     Thyroid : No thyromegaly.  Cardiovascular:     Rate and Rhythm: Normal rate and regular rhythm.     Heart sounds: No murmur heard.    No friction rub. No gallop.  Pulmonary:     Breath sounds: No stridor. No wheezing or rales.  Chest:     Chest wall: No tenderness.  Abdominal:     General: There is no distension.     Tenderness: There is no  abdominal tenderness. There is no rebound.  Musculoskeletal:        General: Normal range of motion.     Cervical back: Neck supple.  Lymphadenopathy:     Cervical: No cervical adenopathy.  Skin:    Findings: No erythema or rash.  Neurological:     Mental Status: He is alert and oriented to person, place, and time.     Motor: No abnormal muscle tone.     Coordination: Coordination normal.  Psychiatric:     Comments: Anxiety     (all labs ordered are listed, but only abnormal results are displayed) Labs Reviewed  BASIC METABOLIC PANEL WITH GFR - Abnormal; Notable for the following components:      Result Value   Glucose, Bld 156 (*)    BUN 26 (*)    All other components within normal limits  CBC WITH DIFFERENTIAL/PLATELET    EKG: None  Radiology: No results found.   Procedures   Medications Ordered in the ED  busPIRone  (BUSPAR ) tablet 10 mg (10 mg Oral Given 07/19/24 1840)  hydrOXYzine  (VISTARIL ) injection 100 mg (100 mg Intramuscular Given 07/19/24 1841)  LORazepam  (ATIVAN ) injection 1 mg (1 mg Intramuscular Given 07/19/24 2029)                                    Medical Decision Making Amount and/or Complexity of Data Reviewed Labs: ordered.  Risk Prescription drug management.   Patient with general anxiety.  He is getting prescription of BuSpar  and Atarax  and will follow-up with his PCP     Final diagnoses:  Anxiety    ED Discharge Orders          Ordered    busPIRone  (BUSPAR ) 15 MG tablet  3 times daily,   Status:  Discontinued        07/19/24 2023    hydrOXYzine  (ATARAX ) 25 MG tablet  Every 6 hours PRN,   Status:  Discontinued        07/19/24 2023               Suzette Pac, MD 07/22/24 1621

## 2024-07-22 NOTE — Patient Instructions (Addendum)
 Return in about 4 weeks (around 08/19/2024) for anxiety.        Great to see you today.  I have refilled the medication(s) we provide.   If labs were collected or images ordered, we will inform you of  results once we have received them and reviewed. We will contact you either by echart message, or telephone call.  Please give ample time to the testing facility, and our office to run,  receive and review results. Please do not call inquiring of results, even if you can see them in your chart. We will contact you as soon as we are able. If it has been over 1 week since the test was completed, and you have not yet heard from us , then please call us .    - echart message- for normal results that have been seen by the patient already.   - telephone call: abnormal results or if patient has not viewed results in their echart.  If a referral to a specialist was entered for you, please call us  in 2 weeks if you have not heard from the specialist office to schedule.

## 2024-08-03 DIAGNOSIS — E118 Type 2 diabetes mellitus with unspecified complications: Secondary | ICD-10-CM | POA: Diagnosis not present

## 2024-08-07 ENCOUNTER — Other Ambulatory Visit: Payer: Self-pay | Admitting: Family Medicine

## 2024-08-08 ENCOUNTER — Ambulatory Visit: Admitting: Family Medicine

## 2024-08-14 ENCOUNTER — Ambulatory Visit (INDEPENDENT_AMBULATORY_CARE_PROVIDER_SITE_OTHER): Admitting: Orthopedic Surgery

## 2024-08-14 ENCOUNTER — Other Ambulatory Visit (INDEPENDENT_AMBULATORY_CARE_PROVIDER_SITE_OTHER): Payer: Self-pay

## 2024-08-14 VITALS — BP 117/69 | HR 60 | Ht 71.0 in | Wt 215.0 lb

## 2024-08-14 DIAGNOSIS — M545 Low back pain, unspecified: Secondary | ICD-10-CM

## 2024-08-14 MED ORDER — DICLOFENAC SODIUM 50 MG PO TBEC: 50.0000 mg | DELAYED_RELEASE_TABLET | Freq: Two times a day (BID) | ORAL | 2 refills | Status: DC

## 2024-08-14 MED ORDER — METHOCARBAMOL 500 MG PO TABS
500.0000 mg | ORAL_TABLET | Freq: Four times a day (QID) | ORAL | 0 refills | Status: DC
Start: 2024-08-14 — End: 2024-10-21
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
## 2024-08-19 ENCOUNTER — Ambulatory Visit: Admitting: Family Medicine

## 2024-08-20 ENCOUNTER — Encounter: Payer: Self-pay | Admitting: Family Medicine

## 2024-08-20 ENCOUNTER — Ambulatory Visit (INDEPENDENT_AMBULATORY_CARE_PROVIDER_SITE_OTHER): Admitting: Family Medicine

## 2024-08-20 VITALS — BP 120/68 | HR 59 | Temp 97.9°F | Wt 217.0 lb

## 2024-08-20 DIAGNOSIS — F5101 Primary insomnia: Secondary | ICD-10-CM

## 2024-08-20 DIAGNOSIS — F411 Generalized anxiety disorder: Secondary | ICD-10-CM | POA: Diagnosis not present

## 2024-08-20 NOTE — Patient Instructions (Addendum)
 See you in November. Glad to see you happy.         Great to see you today.  I have refilled the medication(s) we provide.   If labs were collected or images ordered, we will inform you of  results once we have received them and reviewed. We will contact you either by echart message, or telephone call.  Please give ample time to the testing facility, and our office to run,  receive and review results. Please do not call inquiring of results, even if you can see them in your chart. We will contact you as soon as we are able. If it has been over 1 week since the test was completed, and you have not yet heard from us , then please call us .    - echart message- for normal results that have been seen by the patient already.   - telephone call: abnormal results or if patient has not viewed results in their echart.  If a referral to a specialist was entered for you, please call us  in 2 weeks if you have not heard from the specialist office to schedule.

## 2024-08-20 NOTE — Progress Notes (Signed)
 [name] , [birth year], 68 y.o., male MRN: [id number] Patient Care Team    Relationship Specialty Notifications Start End  Catherine Charlies LABOR, DO PCP - General Family Medicine  11/29/18   Valdemar Rogue, MD Consulting Physician Ophthalmology  01/09/19   Alvan Dorn FALCON, MD Consulting Physician Cardiology  07/19/23     Chief Complaint  Patient presents with   Anxiety    Pt has not had any more panic attacks.      Subjective: [name] is a 68 y.o. Pt presents for an OV to follow-up on worsening anxiety.  Patient was here approximately 4 weeks ago for increased anxiety and panic attack.  During that visit- dc'd trazodone  and valium .  Started Seroquel  100 mg at bedtime ad klonopin  0.5-1 bid prn. He reports he is taking Seroquel  at night and 1/2 - tab of klonopin  at night. He is not needing a klonopin  in the day currently. He reports he feels really good and slept through the night without any additional panic attacks.    Prior note: With complaints of worsening anxiety, panic attacks and hallucinations.  He was seen in the ED 2 days ago and prescribed hydroxyzine  and BuSpar .  Last visit trazodone  was increased due to patient not sleeping well at night noticing more panic at nighttime.  He reports he stopped the trazodone  altogether since it was not working.  He did try the hydroxyzine  and BuSpar  prescribed by the ED and it was not working. He reports he had a panic attack around 5 AM this morning and then again about a 11: 45 today as well today.  States he is waking up screaming turning on the lights scared so that he is in the house. He states he is not drinking any alcohol.  He reports the value is not working at all. He was provided with Ativan  0.5 mg injection in the ED and he states that calmed him down a little bit but he did have another panic attack before being discharged.  Prior note: Not sleeping well and getting scared again.compliant with Valium  4 times daily and  trazodone  100 mg nightly.   Prior note He had been on BuSpar  as well which he originally thought was helpful, but then he felt that it was not as helpful.   [place]  controlled substance database reviewed today Original note: He states he has a rather routine day.  He does not have much social contact in a day.  He lives by himself.  He reports that he always feels like he stays on edge.  He reports when he wakes up he is already feeling on edge and he will take a Valium  at that time.  He then has breakfast and goes on a 4 mile walk daily.  For lunch he starts to feel his anxiety sudden and he becomes panicked.  He states when he feels this way he will break out in a sweat, become very fearful and scared.  He states he commonly fears there is someone in the house with a gun.  He goes to his bedroom gets in his bed. He states the fear is so intense and he just cannot control it.  He denies any known trauma or event experienced in his life that would create such a fear.  He states he did go see a counselor/psychiatrist at [facility] in the past 6 months, and he did not feel that was helpful for him.  Denies any  SI or HI.     02/27/2024    1:46 PM 08/21/2023    1:04 PM 03/24/2023    1:35 PM 12/09/2022    1:43 PM 12/05/2022    9:54 AM  Depression screen PHQ 2/9  Decreased Interest 0 0 0 2 1  Down, Depressed, Hopeless 0 0 0 0 0  PHQ - 2 Score 0 0 0 2 1  Altered sleeping 0   0   Tired, decreased energy 2   2   Change in appetite 2   3   Feeling bad or failure about yourself  0   0   Trouble concentrating 0   2   Moving slowly or fidgety/restless 0   0   Suicidal thoughts 0   0   PHQ-9 Score 4   9   Difficult doing work/chores Not difficult at all          02/27/2024    1:46 PM 12/09/2022    1:44 PM 04/24/2019    8:44 AM 02/12/2019    8:40 AM  GAD 7 : Generalized Anxiety Score  Nervous, Anxious, on Edge 0 2 3 3   Control/stop worrying 0 2 0 0  Worry too much - different things 0 2 0 0  Trouble  relaxing 0 2 2 0  Restless 0 0 2 1  Easily annoyed or irritable 0 0 0 0  Afraid - awful might happen 0 0 3 3  Total GAD 7 Score 0 8 10 7   Anxiety Difficulty Not difficult at all  Somewhat difficult Not difficult at all     Allergies  Allergen Reactions   Gabapentin  Other (See Comments)    Paranoia/hallucinations   Hydrocodone  Other (See Comments)    PT reports  Confusion when tacking Hydrocodone    Nsaids     History of heart disease, CAD with history of MI, CABG status post stent   Prednisone     Paranoia   Tramadol     Penicillins Other (See Comments)    Childhood unsure if still allgeric   Social History   Social History Narrative   Marital status/children/pets: Single   Education/employment: HS grad, 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:      -smoke alarm in the home:Yes     - wears seatbelt: Yes      Past Medical History:  Diagnosis Date   Alcohol addiction (HCC)    Anxiety    Arthritis    Bulging of lumbar intervertebral disc 2017   MRI: L3-4, L4-5 and L5-S1- facet hypertrophy and formainal stenosis   CAD (coronary artery disease)    Coronary artery disease with hx of myocardial infarct w/o hx of CABG 2004   Depression    Dextrocardia    Diabetes (HCC)    Heart attack (HCC) 2004   Stent placement 2008   Hyperlipidemia    Hypertension    Insomnia    Past Surgical History:  Procedure Laterality Date   CORONARY ANGIOPLASTY WITH STENT PLACEMENT  2008   Family History  Problem Relation Age of Onset   Heart attack Mother    Heart disease Mother    Diabetes Mother    Arthritis Mother    Hyperlipidemia Mother    Hypertension Mother    Breast cancer Mother    Heart attack Father    Heart disease Father    Alcohol abuse Father    Early death Father    Arthritis Sister    Heart disease Sister  Hyperlipidemia Sister    Asthma Sister    COPD Sister    Diabetes Sister    Alcohol abuse Sister    Allergies as of 08/20/2024       Reactions   Gabapentin   Other (See Comments)   Paranoia/hallucinations   Hydrocodone  Other (See Comments)   PT reports  Confusion when tacking Hydrocodone    Nsaids    History of heart disease, CAD with history of MI, CABG status post stent   Prednisone    Paranoia   Tramadol     Penicillins Other (See Comments)   Childhood unsure if still allgeric        Medication List        Accurate as of August 20, 2024  1:43 PM. If you have any questions, ask your nurse or doctor.          Accu-Chek FastClix Lancets Misc   aspirin  EC 81 MG tablet Take 1 tablet (81 mg total) by mouth daily.   clonazePAM  1 MG tablet Commonly known as: KLONOPIN  Take 0.5-1 tablets (0.5-1 mg total) by mouth 3 (three) times daily as needed for anxiety.   dapagliflozin  propanediol 10 MG Tabs tablet Commonly known as: Farxiga  TAKE 1 TABLET BY MOUTH  DAILY BEFORE BREAKFAST   Dexcom G7 Receiver Devi Monitor glucose levels 3x daily for IDDM   Dexcom G7 Sensor Misc   diclofenac  50 MG EC tablet Commonly known as: VOLTAREN  Take 1 tablet (50 mg total) by mouth 2 (two) times daily.   Janumet  XR [phone] MG Tb24 Generic drug: SitaGLIPtin -MetFORMIN  HCl Take 1 tablet by mouth daily.   Lantus  SoloStar 100 UNIT/ML Solostar Pen Generic drug: insulin glargine  Inject 70 Units into the skin daily.   lisinopril  20 MG tablet Commonly known as: ZESTRIL  Take 1 tablet (20 mg total) by mouth daily.   methocarbamol  500 MG tablet Commonly known as: ROBAXIN  Take 1 tablet (500 mg total) by mouth 4 (four) times daily.   omega-3 acid ethyl esters 1 g capsule Commonly known as: LOVAZA  Take 1 capsule (1 g total) by mouth daily.   QUEtiapine  100 MG tablet Commonly known as: SEROQUEL  Take 1 tablet (100 mg total) by mouth at bedtime.   rosuvastatin  40 MG tablet Commonly known as: CRESTOR  Take 1 tablet (40 mg total) by mouth at bedtime.        All past medical history, surgical history, allergies, family history, immunizations  andmedications were updated in the EMR today and reviewed under the history and medication portions of their EMR.     ROS Negative, with the exception of above mentioned in HPI   Objective:  BP 120/68   Pulse (!) 59   Temp 97.9 F (36.6 C)   Wt 217 lb (98.4 kg)   SpO2 97%   BMI 30.27 kg/m  Body mass index is 30.27 kg/m. Physical Exam Vitals and nursing note reviewed. Exam conducted with a chaperone present.  Constitutional:      General: He is not in acute distress.    Appearance: Normal appearance. He is not ill-appearing, toxic-appearing or diaphoretic.  HENT:     Head: Normocephalic and atraumatic.  Eyes:     General: No scleral icterus.       Right eye: No discharge.        Left eye: No discharge.     Extraocular Movements: Extraocular movements intact.     Pupils: Pupils are equal, round, and reactive to light.  Cardiovascular:     Rate and Rhythm:  Normal rate and regular rhythm.     Heart sounds: No murmur heard. Pulmonary:     Effort: Pulmonary effort is normal. No respiratory distress.     Breath sounds: Normal breath sounds. No wheezing, rhonchi or rales.  Skin:    General: Skin is warm and dry.     Coloration: Skin is not jaundiced or pale.     Findings: No rash.  Neurological:     Mental Status: He is alert and oriented to person, place, and time. Mental status is at baseline.  Psychiatric:        Mood and Affect: Mood is not anxious, depressed or elated. Affect is not flat, angry, tearful or inappropriate.        Speech: Speech normal.        Behavior: Behavior normal.        Thought Content: Thought content normal.        Cognition and Memory: Cognition and memory normal.        Judgment: Judgment normal.     Comments: Relaxed mood.happy      No results found. No results found. No results found for this or any previous visit (from the past 24 hours).  Assessment/Plan: [name] is a 68 y.o. male present for OV for  Generalized anxiety  disorder (Primary)/Panic disorder/sleep disturbance MUCH improved. No panic attacks since starting new regimen.  Klonopin  0.5-1 mg 3 times daily as needed for for panic.  [place]  controlled substance database reviewed and appropriate today.   Continue Seroquel  100 mg nightly Patient is aware he is out to take Valium  or Vistaril  with Klonopin , nor is he to mix alcohol with Klonopin  use.   Has [facility] appt in November.   Reviewed expectations re: course of current medical issues. Discussed self-management of symptoms. Outlined signs and symptoms indicating need for more acute intervention. Patient verbalized understanding and all questions were answered. Patient received an After-Visit Summary.    No orders of the defined types were placed in this encounter.  No orders of the defined types were placed in this encounter.  Referral Orders  No referral(s) requested today     Note is dictated utilizing voice recognition software. Although note has been proof read prior to signing, occasional typographical errors still can be missed. If any questions arise, please do not hesitate to call for verification.   electronically signed by:  Charlies Bellini, DO  [facility] [place]

## 2024-08-22 ENCOUNTER — Ambulatory Visit: Attending: Internal Medicine | Admitting: Internal Medicine

## 2024-08-22 ENCOUNTER — Encounter: Payer: Self-pay | Admitting: Internal Medicine

## 2024-08-22 VITALS — BP 124/66 | HR 58 | Ht 71.0 in | Wt 216.4 lb

## 2024-08-22 DIAGNOSIS — R0609 Other forms of dyspnea: Secondary | ICD-10-CM | POA: Diagnosis not present

## 2024-08-22 DIAGNOSIS — I10 Essential (primary) hypertension: Secondary | ICD-10-CM | POA: Diagnosis not present

## 2024-08-22 DIAGNOSIS — I350 Nonrheumatic aortic (valve) stenosis: Secondary | ICD-10-CM | POA: Diagnosis not present

## 2024-08-22 DIAGNOSIS — E785 Hyperlipidemia, unspecified: Secondary | ICD-10-CM | POA: Diagnosis not present

## 2024-08-22 DIAGNOSIS — I5022 Chronic systolic (congestive) heart failure: Secondary | ICD-10-CM | POA: Diagnosis not present

## 2024-08-22 DIAGNOSIS — E1169 Type 2 diabetes mellitus with other specified complication: Secondary | ICD-10-CM | POA: Diagnosis not present

## 2024-08-22 DIAGNOSIS — I2510 Atherosclerotic heart disease of native coronary artery without angina pectoris: Secondary | ICD-10-CM | POA: Diagnosis not present

## 2024-08-22 DIAGNOSIS — I252 Old myocardial infarction: Secondary | ICD-10-CM

## 2024-08-22 NOTE — Patient Instructions (Addendum)
 Medication Instructions:  No medication changes were made at this visit. Continue current regimen.   Lab Work: Lipid panel done today or within a week. No need to be fasting  Testing/Procedures: Your physician has requested that you have an echocardiogram. Echocardiography is a painless test that uses sound waves to create images of your heart. It provides your doctor with information about the size and shape of your heart and how well your heart's chambers and valves are working. This procedure takes approximately one hour. There are no restrictions for this procedure. Please do NOT wear cologne, perfume, aftershave, or lotions (deodorant is allowed). Please arrive 15 minutes prior to your appointment time.  Please note: We ask at that you not bring children with you during ultrasound (echo/ vascular) testing. Due to room size and safety concerns, children are not allowed in the ultrasound rooms during exams. Our front office staff cannot provide observation of children in our lobby area while testing is being conducted. An adult accompanying a patient to their appointment will only be allowed in the ultrasound room at the discretion of the ultrasound technician under special circumstances. We apologize for any inconvenience.   Your physician has requested that you have a Lexiscan  nuclear stress test. Lexiscan  (regadenoson ) injection is a pharmacologic stress agent indicated for radionuclide myocardial perfusion imaging (MPI) in patients unable to undergo adequate exercise stress (unable to use the treadmill for a stress test).    Follow-Up: At [facility], you and your health needs are our priority.  As part of our continuing mission to provide you with exceptional heart care, our providers are all part of one team.  This team includes your primary Cardiologist (physician) and Advanced Practice Providers or APPs (Physician Assistants and Nurse Practitioners) who all work together to  provide you with the care you need, when you need it.  Your next appointment:   3 month(s)  Provider:   Emeline FORBES Calender, MD    We recommend signing up for the patient portal called MyChart.  Sign up information is provided on this After Visit Summary.  MyChart is used to connect with patients for Virtual Visits (Telemedicine).  Patients are able to view lab/test results, encounter notes, upcoming appointments, etc.  Non-urgent messages can be sent to your provider as well.   To learn more about what you can do with MyChart, go to [email or web address].   Other Instructions

## 2024-08-22 NOTE — Progress Notes (Signed)
 Cardiology Office Note   Date:  08/22/2024  ID:  KINCAID TIGER, DOB 1956-03-04, MRN [id number] PCP: Catherine Charlies LABOR, DO  [facility] Providers Cardiologist:  Emeline FORBES Calender, MD     History of Present Illness [name] is a 68 y.o. male with a past medical history of diabetes, depression and anxiety, hypertension, mild aortic stenosis, HFmrEF, CAD and MI in 2004 s/p PCI to the LAD and possibly to branch vessel in 2008 who presents today to establish care.  Patient states that he has been having exertional shortness of breath, especially when bending over (bendopnea) which has been occurring for the past several months.  Does not have any exertional chest pain, however.  He also has been having more fatigue lately but relates this to recently starting on Seroquel  and clonazepam  for new onset anxiety attacks over the past several months.  He is not clear why he has been having panic attacks lately but his medications have been helping.  He is taking aspirin  and Crestor  and tolerating these medications.  He has not had any lower extremity edema.  Tobacco use: No Alcohol use: Occasional Activity level: Limited lately due to anxiety and shortness of breath but believes that he would be able to exercise if needed Diet mainly consists of: Salad and low-fat chicken and low-fat dressing Pertinent family history: Father had an MI in his 51s and sister is in her 58s and undergoing a valve replacement workup    ROS:  Review of Systems  All other systems reviewed and are negative.   Physical Exam  Physical Exam Vitals and nursing note reviewed.  Constitutional:      Appearance: Normal appearance.  HENT:     Head: Normocephalic and atraumatic.  Eyes:     Conjunctiva/sclera: Conjunctivae normal.  Neck:     Vascular: No carotid bruit.  Cardiovascular:     Rate and Rhythm: Normal rate and regular rhythm.     Heart sounds: Murmur heard.     Systolic murmur is present with a grade  of 3/6.  Pulmonary:     Effort: Pulmonary effort is normal.     Breath sounds: Normal breath sounds.  Musculoskeletal:        General: No swelling or tenderness.     Right lower leg: No edema.     Left lower leg: No edema.  Skin:    Coloration: Skin is not jaundiced or pale.  Neurological:     Mental Status: He is alert.     VS:  BP 124/66   Pulse (!) 58   Ht 5' 11 (1.803 m)   Wt 216 lb 6.4 oz (98.2 kg)   SpO2 98%   BMI 30.18 kg/m         Wt Readings from Last 3 Encounters:  08/22/24 216 lb 6.4 oz (98.2 kg)  08/20/24 217 lb (98.4 kg)  08/14/24 215 lb (97.5 kg)     EKG Interpretation Date/Time:  Thursday August 22 2024 13:50:21 EDT Ventricular Rate:  58 PR Interval:  224 QRS Duration:  106 QT Interval:  390 QTC Calculation: 382 R Axis:   230  Text Interpretation:  Suspect arm lead reversal, interpretation assumes no reversal Unusual P axis, possible ectopic atrial bradycardia with 1st degree A-V block Right superior axis deviation Anterior infarct , age undetermined Pulmonary disease pattern Nonspecific T wave abnormality When compared with ECG of 24-Jun-2017 04:50, ST no longer elevated in Lateral leads Confirmed by Calender Emeline [phone])  on 08/22/2024 2:20:45 PM    Studies Reviewed   Echo 06/08/2021: Limited images EF 50 to 55% with low normal function and poorly visualized endocardial borders Mild LVH Moderate aortic valve calcification with mild aortic stenosis  Echo 03/06/20: EF 45 to 50% with mildly decreased function Dilated ascending aorta at 37 mm Mild aortic stenosis  Pharmacologic nuclear stress test 06/20/2017 There was no ST segment deviation noted during stress. Defect 1: There is a large defect of severe severity present in the basal anteroseptal, mid anterior, mid anteroseptal, apical anterior, apical septal, apical lateral and apex location. Findings consistent with prior myocardial infarction.   Intermediate risk study due to an extensive  fixed defect (scar) occupying virtually the entire LAD artery distribution.\ Ischemia is not seen.   Risk Assessment/Calculations             ASSESSMENT  Dyspnea on exertion possibly secondary to CAD versus worsening aortic stenosis versus deconditioning Mild aortic stenosis noted on echo in 2022.  S1 and S2 auscultated on exam so less likely severe however given his symptoms this cannot be ruled out Bendopnea CAD with history of STEMI s/p PCI to LAD in 2004 and possibly to branch vessels in 2008 on aspirin  and high intensity statin Hyperlipidemia on statin HFmrEF euvolemic today.  Echo from 2021 shows EF 45 to 50% which improved to 50 to 55% in 2022 GDMT: Farxiga , lisinopril  LVH   Plan  Will obtain an echocardiogram to follow-up on the aortic stenosis as well as LV function.  Prior echoes report poorly visualized endocardium.  Would recommend Definity use if able Will check a pharmacologic myocardial perfusion study given dyspnea on exertion and history Lipid panel Continue aspirin  and statin Advised to go to the ED if he has chest pain Cardiac risk counseling and prevention recommendations: Heart healthy/Mediterranean diet with whole grains, fruits, vegetable, fish, lean meats, nuts, and olive oil. Limit salt. Moderate walking, 3-5 times/week for 30-50 minutes each session. Aim for at least 150 minutes.week. Goal should be pace of 3 miles/hour, or walking 1.5 miles in 30 minutes Avoidance of tobacco products. Avoid excess alcohol.  Follow up: 3 months     Informed Consent   Shared Decision Making/Informed Consent The risks [chest pain, shortness of breath, cardiac arrhythmias, dizziness, blood pressure fluctuations, myocardial infarction, stroke/transient ischemic attack, nausea, vomiting, allergic reaction, radiation exposure, metallic taste sensation and life-threatening complications (estimated to be 1 in 10,000)], benefits (risk stratification, diagnosing coronary artery  disease, treatment guidance) and alternatives of a nuclear stress test were discussed in detail with Mr. Hetland and he agrees to proceed.       Signed, Emeline FORBES Calender, MD

## 2024-08-23 LAB — LIPID PANEL
Chol/HDL Ratio: 2.6 ratio (ref 0.0–5.0)
Cholesterol, Total: 95 mg/dL — ABNORMAL LOW (ref 100–199)
HDL: 36 mg/dL — ABNORMAL LOW (ref >39)
LDL Chol Calc (NIH): 36 mg/dL (ref 0–99)
Triglycerides: 130 mg/dL (ref 0–149)
VLDL Cholesterol Cal: 23 mg/dL (ref 5–40)

## 2024-08-26 ENCOUNTER — Ambulatory Visit: Payer: Self-pay | Admitting: Internal Medicine

## 2024-09-02 DIAGNOSIS — E118 Type 2 diabetes mellitus with unspecified complications: Secondary | ICD-10-CM | POA: Diagnosis not present

## 2024-09-18 ENCOUNTER — Other Ambulatory Visit: Payer: Self-pay | Admitting: Family Medicine

## 2024-09-18 ENCOUNTER — Encounter (HOSPITAL_COMMUNITY): Payer: Self-pay | Admitting: *Deleted

## 2024-09-20 ENCOUNTER — Encounter: Payer: Self-pay | Admitting: Urgent Care

## 2024-09-20 ENCOUNTER — Ambulatory Visit: Admitting: Urgent Care

## 2024-09-20 VITALS — BP 132/69 | HR 53 | Ht 71.0 in | Wt 220.0 lb

## 2024-09-20 DIAGNOSIS — H6123 Impacted cerumen, bilateral: Secondary | ICD-10-CM

## 2024-09-20 NOTE — Progress Notes (Signed)
 Established Patient Office Visit  Subjective:  Patient ID: [name], male    DOB: Jan 22, 1956  Age: 68 y.o. MRN: [id number]  Chief Complaint  Patient presents with   clogged ears    X 4 days    HPI  Discussed the use of AI scribe software for clinical note transcription with the patient, who gave verbal consent to proceed.  History of Present Illness   [name] is a 67 year old male who presents with bilateral ear fullness and hearing difficulties. He is accompanied by his sister.  He has been experiencing bilateral ear fullness for the past four days, a condition he has never encountered before. Despite attempts at using ear drops, there has been no relief. Occasionally, auto insufflation results in a temporary improvement in hearing when his left or right ear pops, but this is not consistent.  He is experiencing significant hearing difficulties, requiring his sister to yell in his ear for him to understand conversations. He also needs to set the TV volume to 80 and use closed captions to comprehend the content.  No ear pain, dizziness, vertigo, fever, cold, or sinus pressure. He mentions having tinnitus, which he states is not new.       Patient Active Problem List   Diagnosis Date Noted   Chronic heart failure with mildly reduced ejection fraction (HFmrEF, 41-49%) (HCC) 08/22/2024   Panic disorder 07/22/2024   Insulin dependent type 2 diabetes mellitus (HCC) 07/02/2024   Bradycardia 07/14/2023   Carpal tunnel syndrome, right upper limb 01/06/2023   Vitamin D  deficiency 11/12/2021   Mild concentric left ventricular hypertrophy (LVH) 06/09/2021   Encounter for screening involving social determinants of health (SDoH) 05/20/2021   Aortic stenosis, mild 05/20/2021   Aortic root dilatation 05/20/2021   Facet hypertrophy of lumbosacral region 07/04/2019   Foraminal stenosis of lumbar region 07/04/2019   Mild nonproliferative diabetic retinopathy (HCC) 01/09/2019    Generalized anxiety disorder 09/02/2017   Vasculogenic erectile dysfunction 09/02/2017   Insomnia 12/28/2015   Diabetic peripheral neuropathy (HCC) 12/21/2015   Bulging of lumbar intervertebral disc 2017   Type 2 diabetes mellitus with hyperlipidemia (HCC) 07/07/2014   S/P coronary artery stent placement 03/31/2014   Coronary artery disease with history of myocardial infarction without history of CABG 03/31/2014   Essential hypertension 03/28/2014   Past Medical History:  Diagnosis Date   Alcohol addiction (HCC)    Anxiety    Arthritis    Bulging of lumbar intervertebral disc 2017   MRI: L3-4, L4-5 and L5-S1- facet hypertrophy and formainal stenosis   CAD (coronary artery disease)    Coronary artery disease with hx of myocardial infarct w/o hx of CABG 2004   Depression    Dextrocardia    Diabetes (HCC)    Heart attack (HCC) 2004   Stent placement 2008   Hyperlipidemia    Hypertension    Insomnia    Past Surgical History:  Procedure Laterality Date   CORONARY ANGIOPLASTY WITH STENT PLACEMENT  2008   Social History   Tobacco Use   Smoking status: Never   Smokeless tobacco: Never  Vaping Use   Vaping status: Never Used  Substance Use Topics   Alcohol use: Yes    Alcohol/week: 0.0 standard drinks of alcohol    Comment: Occasional   Drug use: Not Currently    Types: Cocaine, Marijuana    Comment: Former      ROS: as noted in HPI  Objective:  BP 132/69   Pulse (!) 53   Ht 5' 11 (1.803 m)   Wt 220 lb (99.8 kg)   SpO2 96%   BMI 30.68 kg/m  BP Readings from Last 3 Encounters:  09/20/24 132/69  08/22/24 124/66  08/20/24 120/68   Wt Readings from Last 3 Encounters:  09/20/24 220 lb (99.8 kg)  08/22/24 216 lb 6.4 oz (98.2 kg)  08/20/24 217 lb (98.4 kg)      Physical Exam Vitals and nursing note reviewed. Exam conducted with a chaperone present.  Constitutional:      General: He is not in acute distress.    Appearance: Normal appearance. He is not  ill-appearing, toxic-appearing or diaphoretic.  HENT:     Head: Normocephalic and atraumatic.     Right Ear: External ear normal. There is impacted cerumen.     Left Ear: External ear normal. There is impacted cerumen.     Ears:     Comments: Upon removal of the cerumen, the EAC were clear without edema or debris. TM bilaterally were normal    Nose: Nose normal. No congestion.     Mouth/Throat:     Mouth: Mucous membranes are moist.  Eyes:     General: No scleral icterus.       Right eye: No discharge.        Left eye: No discharge.     Pupils: Pupils are equal, round, and reactive to light.  Cardiovascular:     Rate and Rhythm: Normal rate.  Pulmonary:     Effort: Pulmonary effort is normal. No respiratory distress.  Musculoskeletal:     Cervical back: Normal range of motion and neck supple. No rigidity or tenderness.  Lymphadenopathy:     Cervical: No cervical adenopathy.  Skin:    General: Skin is warm and dry.     Findings: No erythema or rash.  Neurological:     General: No focal deficit present.     Mental Status: He is alert and oriented to person, place, and time.  Psychiatric:        Mood and Affect: Mood normal.    Ear Cerumen Removal  Date/Time: 09/20/2024 2:15 PM  Performed by: Lowella Benton CROME, PA Authorized by: Lowella Benton CROME, PA   Anesthesia: Local Anesthetic: none Location details: right ear and left ear Patient tolerance: patient tolerated the procedure well with no immediate complications Comments: B EAC clear post-procedure. Pt back to baseline hearing Procedure type: curette (both currette and irrigation used)  Sedation: Patient sedated: no       No results found for any visits on 09/20/24.   The ASCVD Risk score (Arnett DK, et al., 2019) failed to calculate for the following reasons:   Risk score cannot be calculated because patient has a medical history suggesting prior/existing ASCVD  Assessment & Plan:  Bilateral impacted  cerumen -     Ear Lavage -     Ear Cerumen Removal  Assessment and Plan    Bilateral ear fullness and hearing loss due to impacted cerumen Impacted cerumen causing significant hearing impairment. Auto insufflation provides temporary relief. No associated pain or systemic symptoms. Chronic tinnitus unchanged.  - both EACs cerumen impaction was removed with combination method of irrigation/ lavage and curette.  - EAC clear B upon DC without signs of infection        No follow-ups on file.   Benton CROME Lowella, PA

## 2024-09-20 NOTE — Patient Instructions (Signed)
  Please purchase the product listed above to help remove excess cerumen from your ears in the future. Do not use Q tips.

## 2024-09-27 ENCOUNTER — Other Ambulatory Visit: Payer: Self-pay | Admitting: Internal Medicine

## 2024-09-27 DIAGNOSIS — R0609 Other forms of dyspnea: Secondary | ICD-10-CM

## 2024-09-30 ENCOUNTER — Encounter: Payer: Self-pay | Admitting: Radiology

## 2024-09-30 ENCOUNTER — Ambulatory Visit (HOSPITAL_COMMUNITY)
Admission: RE | Admit: 2024-09-30 | Discharge: 2024-09-30 | Disposition: A | Discharge status: Home / Self Care | Source: Ambulatory Visit | Attending: Cardiology | Admitting: Cardiology

## 2024-09-30 DIAGNOSIS — R0609 Other forms of dyspnea: Secondary | ICD-10-CM

## 2024-09-30 DIAGNOSIS — I350 Nonrheumatic aortic (valve) stenosis: Secondary | ICD-10-CM | POA: Diagnosis present

## 2024-09-30 LAB — ECHOCARDIOGRAM COMPLETE
AR max vel: 1.49 cm2
AV Area VTI: 1.57 cm2
AV Area mean vel: 1.42 cm2
AV Mean grad: 10.7 mmHg
AV Peak grad: 19.6 mmHg
Ao pk vel: 2.21 m/s
S' Lateral: 3.13 cm

## 2024-09-30 MED ORDER — PERFLUTREN LIPID MICROSPHERE
1.0000 mL | INTRAVENOUS | Status: DC | PRN
  Administered 2024-09-30: 3 mL via INTRAVENOUS

## 2024-10-02 ENCOUNTER — Ambulatory Visit (INDEPENDENT_AMBULATORY_CARE_PROVIDER_SITE_OTHER)

## 2024-10-02 VITALS — Ht 71.0 in | Wt 220.0 lb

## 2024-10-02 DIAGNOSIS — Z0000 Encounter for general adult medical examination without abnormal findings: Secondary | ICD-10-CM | POA: Diagnosis not present

## 2024-10-02 NOTE — Patient Instructions (Signed)
 [name],  Thank you for taking the time for your Medicare Wellness Visit. I appreciate your continued commitment to your health goals. Please review the care plan we discussed, and feel free to reach out if I can assist you further.  Please note that Annual Wellness Visits do not include a physical exam. Some assessments may be limited, especially if the visit was conducted virtually. If needed, we may recommend an in-person follow-up with your provider.  Ongoing Care Seeing your primary care provider every 3 to 6 months helps us  monitor your health and provide consistent, personalized care. Next office visit on 10/09/2024.  Remember to get your Flu vaccine during office visit.  Aim for 30 minutes of exercise or brisk walking, 6-8 glasses of water, and 5 servings of fruits and vegetables each day.   Referrals If a referral was made during today's visit and you haven't received any updates within two weeks, please contact the referred provider directly to check on the status.  Recommended Screenings:  Health Maintenance  Topic Date Due   Eye exam for diabetics  07/14/2023   Flu Shot  02/25/2025*   Hemoglobin A1C  01/02/2025   Yearly kidney health urinalysis for diabetes  02/26/2025   DTaP/Tdap/Td vaccine (2 - Td or Tdap) 04/15/2025   Complete foot exam   07/02/2025   Yearly kidney function blood test for diabetes  07/19/2025   Medicare Annual Wellness Visit  10/02/2025   Cologuard (Stool DNA test)  12/06/2025   Pneumococcal Vaccine for age over 4  Completed   Hepatitis C Screening  Completed   Zoster (Shingles) Vaccine  Completed   Meningitis B Vaccine  Aged Out   COVID-19 Vaccine  Discontinued  *Topic was postponed. The date shown is not the original due date.       10/02/2024   10:11 AM  Advanced Directives  Does Patient Have a Medical Advance Directive? No  Would patient like information on creating a medical advance directive? No - Patient declined    Vision: Annual  vision screenings are recommended for early detection of glaucoma, cataracts, and diabetic retinopathy. These exams can also reveal signs of chronic conditions such as diabetes and high blood pressure.  Dental: Annual dental screenings help detect early signs of oral cancer, gum disease, and other conditions linked to overall health, including heart disease and diabetes.  Please see the attached documents for additional preventive care recommendations.

## 2024-10-02 NOTE — Progress Notes (Addendum)
 Subjective:   [name] is a 68 y.o. male who presents for a Medicare Annual Wellness Visit.  I connected with [name]  on 10/02/24 by a audio enabled telemedicine application and verified that I am speaking with the correct person using two identifiers.  Patient Location: Home  Provider Location: Home Office  Persons Participating in Visit: Patient.  I discussed the limitations of evaluation and management by telemedicine. The patient expressed understanding and agreed to proceed.  Vital Signs: Because this visit was a virtual/telehealth visit, some criteria may be missing or patient reported. Any vitals not documented were not able to be obtained and vitals that have been documented are patient reported.  Allergies (verified) Gabapentin , Hydrocodone , Nsaids, Prednisone, Tramadol , and Penicillins   History: Past Medical History:  Diagnosis Date   Alcohol addiction (HCC)    Anxiety    Arthritis    Bulging of lumbar intervertebral disc 2017   MRI: L3-4, L4-5 and L5-S1- facet hypertrophy and formainal stenosis   CAD (coronary artery disease)    Coronary artery disease with hx of myocardial infarct w/o hx of CABG 2004   Depression    Dextrocardia    Diabetes (HCC)    Heart attack (HCC) 2004   Stent placement 2008   Hyperlipidemia    Hypertension    Insomnia    Past Surgical History:  Procedure Laterality Date   CORONARY ANGIOPLASTY WITH STENT PLACEMENT  2008   Family History  Problem Relation Age of Onset   Heart attack Mother    Heart disease Mother    Diabetes Mother    Arthritis Mother    Hyperlipidemia Mother    Hypertension Mother    Breast cancer Mother    Heart attack Father    Heart disease Father    Alcohol abuse Father    Early death Father    Arthritis Sister    Heart disease Sister    Hyperlipidemia Sister    Asthma Sister    COPD Sister    Diabetes Sister    Alcohol abuse Sister    Social History   Occupational History    Occupation: Full time    Employer: UNEMPLOYED   Occupation: RETIRED  Tobacco Use   Smoking status: Never   Smokeless tobacco: Never  Vaping Use   Vaping status: Never Used  Substance and Sexual Activity   Alcohol use: Yes    Alcohol/week: 0.0 standard drinks of alcohol    Comment: Occasional   Drug use: Not Currently    Types: Cocaine, Marijuana    Comment: Former   Sexual activity: Not Currently    Partners: Female   Tobacco Counseling Counseling given: Not Answered  SDOH Screenings   Food Insecurity: Food Insecurity Present (10/02/2024)  Housing: Unknown (10/02/2024)  Transportation Needs: No Transportation Needs (10/02/2024)  Utilities: Not At Risk (10/02/2024)  Alcohol Screen: Low Risk  (08/21/2023)  Depression (PHQ2-9): Low Risk  (10/02/2024)  Financial Resource Strain: Low Risk  (08/21/2023)  Physical Activity: Inactive (10/02/2024)  Social Connections: Socially Isolated (10/02/2024)  Stress: No Stress Concern Present (10/02/2024)  Tobacco Use: Low Risk  (10/02/2024)  Health Literacy: Adequate Health Literacy (10/02/2024)   Depression Screen    10/02/2024   10:25 AM 02/27/2024    1:46 PM 08/21/2023    1:04 PM 03/24/2023    1:35 PM 12/09/2022    1:43 PM 12/05/2022    9:54 AM 08/19/2022   12:57 PM  PHQ 2/9 Scores  PHQ - 2  Score 0 0 0 0 2 1 0  PHQ- 9 Score 0 4   9       Goals Addressed             This Visit's Progress    Patient Stated       Not at this time/2025       Visit info / Clinical Intake: Medicare Wellness Visit Type:: Subsequent Annual Wellness Visit Medicare Wellness Visit Mode:: Telephone If telephone:: video declined If telephone or video:: vitals recorded from last visit Interpreter Needed?: No Pre-visit prep was completed: yes AWV questionnaire completed by patient prior to visit?: no Living arrangements:: (!) lives alone Patient's Overall Health Status Rating: good Typical amount of pain: none Does pain affect daily life?: no Are you  currently prescribed opioids?: no  Dietary Habits and Nutritional Risks How many meals a day?: (!) 1 Eats fruit and vegetables daily?: (!) no (some days) Most meals are obtained by: preparing own meals Diabetic:: (!) yes Any non-healing wounds?: no How often do you check your BS?: continuous glucose monitor Would you like to be referred to a Nutritionist or for Diabetic Management? : no  Functional Status Activities of Daily Living (to include ambulation/medication): Independent Ambulation: Independent with device- listed below Home Assistive Devices/Equipment: Eyeglasses Medication Administration: Independent Home Management: Independent Manage your own finances?: yes Primary transportation is: family/friends (sister drives him) Concerns about vision?: (!) yes (waers eyeglasses) Concerns about hearing?: no Uses hearing aids?: no Hear whispered voice?: yes  Fall Screening Falls in the past year?: 0 Number of falls in past year: 0 Was there an injury with Fall?: 0 Fall Risk Category Calculator: 0 Patient Fall Risk Level: Low Fall Risk  Fall Risk Patient at Risk for Falls Due to: No Fall Risks Fall risk Follow up: Falls evaluation completed; Falls prevention discussed  Home and Transportation Safety: All rugs have non-skid backing?: yes All stairs or steps have railings?: (!) no Grab bars in the bathtub or shower?: (!) no Have non-skid surface in bathtub or shower?: yes Good home lighting?: yes Regular seat belt use?: yes Hospital stays in the last year:: no  Cognitive Assessment Difficulty concentrating, remembering, or making decisions? : no Will 6CIT or Mini Cog be Completed: no 6CIT or Mini Cog Declined: patient alert, oriented, able to answer questions appropriately and recall recent events  Advance Directives (For Healthcare) Does Patient Have a Medical Advance Directive?: No Would patient like information on creating a medical advance directive?: No - Patient  declined  Reviewed/Updated  Reviewed/Updated: All        Objective:    Today's Vitals   10/02/24 1008  Weight: 220 lb (99.8 kg)  Height: 5' 11 (1.803 m)   Body mass index is 30.68 kg/m.  Current Medications (verified) Outpatient Encounter Medications as of 10/02/2024  Medication Sig   Accu-Chek FastClix Lancets MISC    aspirin  EC 81 MG tablet Take 1 tablet (81 mg total) by mouth daily.   clonazePAM  (KLONOPIN ) 1 MG tablet Take 0.5-1 tablets (0.5-1 mg total) by mouth 3 (three) times daily as needed for anxiety.   Continuous Glucose Receiver (DEXCOM G7 RECEIVER) DEVI Monitor glucose levels 3x daily for IDDM   Continuous Glucose Sensor (DEXCOM G7 SENSOR) MISC    dapagliflozin  propanediol (FARXIGA ) 10 MG TABS tablet TAKE 1 TABLET BY MOUTH  DAILY BEFORE BREAKFAST   diclofenac  (VOLTAREN ) 50 MG EC tablet Take 1 tablet (50 mg total) by mouth 2 (two) times daily. (Patient taking differently:  Take 50 mg by mouth daily.)   insulin glargine  (LANTUS  SOLOSTAR) 100 UNIT/ML Solostar Pen Inject 70 Units into the skin daily.   JANUMET  XR [phone] MG TB24 Take 1 tablet by mouth daily.   lisinopril  (ZESTRIL ) 20 MG tablet Take 1 tablet (20 mg total) by mouth daily.   methocarbamol  (ROBAXIN ) 500 MG tablet Take 1 tablet (500 mg total) by mouth 4 (four) times daily.   omega-3 acid ethyl esters (LOVAZA ) 1 g capsule Take 1 capsule (1 g total) by mouth daily.   QUEtiapine  (SEROQUEL ) 100 MG tablet Take 1 tablet (100 mg total) by mouth at bedtime.   rosuvastatin  (CRESTOR ) 40 MG tablet Take 1 tablet (40 mg total) by mouth at bedtime.   No facility-administered encounter medications on file as of 10/02/2024.   Hearing/Vision screen Hearing Screening - Comments:: Denies hearing difficulties   Vision Screening - Comments:: Waesr eyeglasses/[facility]/Dr. Zamora/pt up to date Immunizations and Health Maintenance Health Maintenance  Topic Date Due   OPHTHALMOLOGY EXAM  07/14/2023   Medicare Annual  Wellness (AWV)  08/20/2024   Influenza Vaccine  02/25/2025 (Originally 06/28/2024)   HEMOGLOBIN A1C  01/02/2025   Diabetic kidney evaluation - Urine ACR  02/26/2025   DTaP/Tdap/Td (2 - Td or Tdap) 04/15/2025   FOOT EXAM  07/02/2025   Diabetic kidney evaluation - eGFR measurement  07/19/2025   Fecal DNA (Cologuard)  12/06/2025   Pneumococcal Vaccine: 50+ Years  Completed   Hepatitis C Screening  Completed   Zoster Vaccines- Shingrix   Completed   Meningococcal B Vaccine  Aged Out   COVID-19 Vaccine  Discontinued        Assessment/Plan:  This is a routine wellness examination for Higinio.  Patient Care Team: Catherine Charlies LABOR, DO as PCP - General (Family Medicine) Kriste Emeline BRAVO, MD as PCP - Cardiology (Cardiology) Valdemar Rogue, MD as Consulting Physician (Ophthalmology) Alvan Dorn FALCON, MD as Consulting Physician (Cardiology)  I have personally reviewed and noted the following in the patient's chart:   Medical and social history Use of alcohol, tobacco or illicit drugs  Current medications and supplements including opioid prescriptions. Functional ability and status Nutritional status Physical activity Advanced directives List of other physicians Hospitalizations, surgeries, and ER visits in previous 12 months Vitals Screenings to include cognitive, depression, and falls Referrals and appointments  No orders of the defined types were placed in this encounter.  In addition, I have reviewed and discussed with patient certain preventive protocols, quality metrics, and best practice recommendations. A written personalized care plan for preventive services as well as general preventive health recommendations were provided to patient.   Pratham Cassatt L Matthew Pais, CMA   10/02/2024   No follow-ups on file.  After Visit Summary: (MyChart) Due to this being a telephonic visit, the after visit summary with patients personalized plan was offered to patient via MyChart   Nurse Notes: Patient  is due for a Flu vaccine and would like to get during his next office visit.  He stated that he has had his diabetic eye exam at [facility] for this year.  I have sent a request for records out today.  Patient had no other concerns to address today.

## 2024-10-04 ENCOUNTER — Telehealth (HOSPITAL_COMMUNITY): Payer: Self-pay | Admitting: *Deleted

## 2024-10-04 NOTE — Telephone Encounter (Signed)
 Patient given detailed instructions per Myocardial Perfusion Study Information Sheet for the test on 10/09/2024 at 9:15. Patient notified to arrive 15 minutes early and that it is imperative to arrive on time for appointment to keep from having the test rescheduled.  If you need to cancel or reschedule your appointment, please call the office within 24 hours of your appointment. . Patient verbalized understanding.[name]

## 2024-10-09 ENCOUNTER — Ambulatory Visit: Admitting: Family Medicine

## 2024-10-09 ENCOUNTER — Ambulatory Visit (HOSPITAL_COMMUNITY)
Admission: RE | Admit: 2024-10-09 | Discharge: 2024-10-09 | Disposition: A | Discharge status: Home / Self Care | Source: Ambulatory Visit | Attending: Internal Medicine | Admitting: Internal Medicine

## 2024-10-09 ENCOUNTER — Encounter: Payer: Self-pay | Admitting: Family Medicine

## 2024-10-09 VITALS — BP 126/76 | HR 59 | Temp 98.3°F | Wt 218.0 lb

## 2024-10-09 DIAGNOSIS — Z23 Encounter for immunization: Secondary | ICD-10-CM

## 2024-10-09 DIAGNOSIS — E1169 Type 2 diabetes mellitus with other specified complication: Secondary | ICD-10-CM

## 2024-10-09 DIAGNOSIS — E1142 Type 2 diabetes mellitus with diabetic polyneuropathy: Secondary | ICD-10-CM

## 2024-10-09 DIAGNOSIS — F411 Generalized anxiety disorder: Secondary | ICD-10-CM

## 2024-10-09 DIAGNOSIS — Z955 Presence of coronary angioplasty implant and graft: Secondary | ICD-10-CM

## 2024-10-09 DIAGNOSIS — R0609 Other forms of dyspnea: Secondary | ICD-10-CM | POA: Diagnosis present

## 2024-10-09 DIAGNOSIS — F5101 Primary insomnia: Secondary | ICD-10-CM

## 2024-10-09 DIAGNOSIS — Z7984 Long term (current) use of oral hypoglycemic drugs: Secondary | ICD-10-CM

## 2024-10-09 DIAGNOSIS — Z794 Long term (current) use of insulin: Secondary | ICD-10-CM

## 2024-10-09 DIAGNOSIS — E113293 Type 2 diabetes mellitus with mild nonproliferative diabetic retinopathy without macular edema, bilateral: Secondary | ICD-10-CM | POA: Diagnosis not present

## 2024-10-09 DIAGNOSIS — I2510 Atherosclerotic heart disease of native coronary artery without angina pectoris: Secondary | ICD-10-CM

## 2024-10-09 DIAGNOSIS — F410 Panic disorder [episodic paroxysmal anxiety] without agoraphobia: Secondary | ICD-10-CM

## 2024-10-09 DIAGNOSIS — I77810 Thoracic aortic ectasia: Secondary | ICD-10-CM

## 2024-10-09 DIAGNOSIS — I350 Nonrheumatic aortic (valve) stenosis: Secondary | ICD-10-CM

## 2024-10-09 DIAGNOSIS — I10 Essential (primary) hypertension: Secondary | ICD-10-CM

## 2024-10-09 DIAGNOSIS — E119 Type 2 diabetes mellitus without complications: Secondary | ICD-10-CM

## 2024-10-09 DIAGNOSIS — I5022 Chronic systolic (congestive) heart failure: Secondary | ICD-10-CM

## 2024-10-09 LAB — POCT GLYCOSYLATED HEMOGLOBIN (HGB A1C)
HbA1c POC (<> result, manual entry): 7.5 % (ref 4.0–5.6)
HbA1c, POC (controlled diabetic range): 7.5 % — AB (ref 0.0–7.0)
HbA1c, POC (prediabetic range): 7.5 % — AB (ref 5.7–6.4)
Hemoglobin A1C: 7.5 % — AB (ref 4.0–5.6)

## 2024-10-09 MED ORDER — DIAZEPAM 5 MG PO TABS: 5.0000 mg | ORAL_TABLET | Freq: Four times a day (QID) | ORAL | 5 refills | Status: AC | PRN

## 2024-10-09 MED ORDER — ROSUVASTATIN CALCIUM 40 MG PO TABS: 40.0000 mg | ORAL_TABLET | Freq: Every day | ORAL | 1 refills | Status: AC

## 2024-10-09 MED ORDER — REGADENOSON 0.4 MG/5ML IV SOLN
0.4000 mg | Freq: Once | INTRAVENOUS | Status: AC
  Administered 2024-10-09: 0.4 mg via INTRAVENOUS

## 2024-10-09 MED ORDER — LANTUS SOLOSTAR 100 UNIT/ML ~~LOC~~ SOPN: 75.0000 [IU] | PEN_INJECTOR | Freq: Every day | SUBCUTANEOUS | 1 refills | Status: DC

## 2024-10-09 MED ORDER — JANUMET XR 100-1000 MG PO TB24
1.0000 | ORAL_TABLET | Freq: Every day | ORAL | 1 refills | Status: AC
Start: 2024-10-09 — End: ?

## 2024-10-09 MED ORDER — LISINOPRIL 20 MG PO TABS: 20.0000 mg | ORAL_TABLET | Freq: Every day | ORAL | 1 refills | Status: AC

## 2024-10-09 MED ORDER — DAPAGLIFLOZIN PROPANEDIOL 10 MG PO TABS: ORAL_TABLET | ORAL | 1 refills | Status: AC

## 2024-10-09 MED ORDER — REGADENOSON 0.4 MG/5ML IV SOLN
INTRAVENOUS | Status: AC
  Filled 2024-10-09: qty 5

## 2024-10-09 MED ORDER — QUETIAPINE FUMARATE 100 MG PO TABS: ORAL_TABLET | ORAL | 1 refills | Status: AC

## 2024-10-09 MED ORDER — TECHNETIUM TC 99M TETROFOSMIN IV KIT
31.8000 | PACK | Freq: Once | INTRAVENOUS | Status: AC | PRN
  Administered 2024-10-09: 31.8 via INTRAVENOUS

## 2024-10-09 MED ORDER — TECHNETIUM TC 99M TETROFOSMIN IV KIT
10.1000 | PACK | Freq: Once | INTRAVENOUS | Status: AC | PRN
  Administered 2024-10-09: 10.1 via INTRAVENOUS

## 2024-10-09 NOTE — Patient Instructions (Addendum)
 Return in about 15 weeks (around 01/22/2025) for Routine chronic condition follow-up.   Increase insulin to 74u total daily ( you can take 37 units twice a day)     Great to see you today.  I have refilled the medication(s) we provide.   If labs were collected or images ordered, we will inform you of  results once we have received them and reviewed. We will contact you either by echart message, or telephone call.  Please give ample time to the testing facility, and our office to run,  receive and review results. Please do not call inquiring of results, even if you can see them in your chart. We will contact you as soon as we are able. If it has been over 1 week since the test was completed, and you have not yet heard from us , then please call us .    - echart message- for normal results that have been seen by the patient already.   - telephone call: abnormal results or if patient has not viewed results in their echart.  If a referral to a specialist was entered for you, please call us  in 2 weeks if you have not heard from the specialist office to schedule.

## 2024-10-09 NOTE — Progress Notes (Signed)
 [name] , [birth year], 68 y.o., male MRN: [id number] Patient Care Team    Relationship Specialty Notifications Start End  Catherine Charlies LABOR, DO PCP - General Family Medicine  11/29/18   Kriste Emeline BRAVO, MD PCP - Cardiology Cardiology  08/22/24   Valdemar Rogue, MD Consulting Physician Ophthalmology  01/09/19   Alvan Dorn FALCON, MD Consulting Physician Cardiology  07/19/23   [facility]    10/02/24    Comment: [facility]    Chief Complaint  Patient presents with   Diabetes    Chronic Conditions/illness Management Influenza vaccine- declined     Subjective:[name] is a 68 y.o.  Pt presents for an OV for Chronic Conditions/illness Management Medication reconciliation completed today All past medical history updated where appropriate.  Diabetes with complications/overweight:  Patient reports he has been a diabetic since 1998.   He reports with compliance w/  Janumet /Metformin  1000-100 mg daily, Farxiga  10 mg daily and Lantus  70 units daily. Patient denies dizziness, hyperglycemic or hypoglycemic events. Patient denies numbness, tingling in the extremities or nonhealing wounds of feet. .   Depression/anxiety/insomnia: Patient reports overall he is doing much better.  He feels the Seroquel  is working very well for him, but does not like how the Klonopin  makes him feel.  He is wondering if he can go back to the Valium  instead of the Klonopin . Prior note Started Seroquel  100 mg at bedtime ad klonopin  0.5-1 bid prn. He reports he is taking Seroquel  at night and 1/2 - tab of klonopin  at night. He is not needing a klonopin  in the day currently. He reports he feels really good and slept through the night without any additional panic attacks.  Patient reports he is feeling off since a bee sting last week. Not sleeping well and getting scared again.compliant with Valium  4 times daily and trazodone  100 mg nightly.   Prior note He had been on BuSpar   as well which he originally thought was helpful, but then he felt that it was not as helpful.   [place]  controlled substance database reviewed today Original note: He states he has a rather routine day.  He does not have much social contact in a day.  He lives by himself.  He reports that he always feels like he stays on edge.  He reports when he wakes up he is already feeling on edge and he will take a Valium  at that time.  He then has breakfast and goes on a 4 mile walk daily.  For lunch he starts to feel his anxiety sudden and he becomes panicked.  He states when he feels this way he will break out in a sweat, become very fearful and scared.  He states he commonly fears there is someone in the house with a gun.  He goes to his bedroom gets in his bed. He states the fear is so intense and he just cannot control it.  He denies any known trauma or event experienced in his life that would create such a fear.  He states he did go see a counselor/psychiatrist at [facility] in the past 6 months, and he did not feel that was helpful for him.  Denies any SI or HI.  Hypertension/CAD/S/p Stent/prior MI:  Pt has a significant cardiac history of anterior MI in 2004 with stents was LAD and then stents to branch vessel 2008 (by DR. Renaldo-cardio note 2015) Patient reports compliance with lisinopril  20  mg daily,, Lovaza , and Crestor .  His statin was increased by cardiology to Crestor  40 mg daily.  Patient denies chest pain, shortness of breath, dizziness or lower extremity edema.     Diet: Low-sodium Exercise: Routine exercise daily RF: Hypertension, hyperlipidemia, diabetes, overweight, family history, CAD, MI   Neck pain/back pain: pt reports back pain is about the same. The naproxen  twice daily as needed is helpful, especially during tractor season Prior note: Patient has had recurrent posterior neck pain and headache.  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  He states he found an old prescription of naproxen  and restarted it the last time he had symptoms.  He states it worked well for him.  He is wondering if we could get refills for him today.  In the past he has stated he could not tolerate NSAIDs.     Erectile dysfunction: (Documentation only) patient would like to start a medication for erectile dysfunction.  He states he seen a urologist many years ago and does not desire to see a urologist again.  Patient has a significant medical history of blood pressure with systolic less than 100 and history of CAD and MI.  Echo 06/08/2021: EF 50-55%.  LVH.  Moderate calcification of aortic valve.  echo 03/06/2020:  1. Technically difficult study. Left ventricular ejection fraction, by  estimation, is 45 to 50%. The left ventricle has grossly mildly decreased  function. Image quality is not sufficient to assess for regional wall  motion abnormalities. Left ventricular   diastolic parameters are indeterminate.   2. Right ventricle is poorly visualized. Grossly normal size and systolic  function. Tricuspid regurgitation signal is inadequate for assessing PA  pressure.   3. The mitral valve is normal in structure. No evidence of mitral valve  regurgitation.   4. Aortic dilatation noted. There is mild dilatation of the ascending  aorta measuring 37 mm.   5. The aortic valve is abnormal. Moderately calcified. Aortic valve  regurgitation is not visualized. Mild aortic valve stenosis. Vmax 2.2 m/s,  MG 10 mmHg, AVA 1.7 cm^2, DI 0.4      10/02/2024   10:25 AM 02/27/2024    1:46 PM 08/21/2023    1:04 PM 03/24/2023    1:35 PM 12/09/2022    1:43 PM  Depression screen PHQ 2/9  Decreased Interest 0 0 0 0 2  Down, Depressed, Hopeless 0 0 0 0 0  PHQ - 2 Score 0 0 0 0 2  Altered sleeping 0 0   0  Tired, decreased energy 0 2   2  Change in appetite 0 2   3  Feeling bad or failure about yourself  0 0   0  Trouble concentrating 0 0   2  Moving slowly or  fidgety/restless 0 0   0  Suicidal thoughts 0 0   0  PHQ-9 Score 0  4    9   Difficult doing work/chores Not difficult at all Not difficult at all        Data saved with a previous flowsheet row definition      09/11/2023   11:36 AM 01/02/2024   11:33 AM 02/27/2024    1:46 PM 07/02/2024    1:03 PM 10/02/2024   10:11 AM  Fall Risk  Falls in the past year? 0 0 0 0 0  Was there an injury with Fall?    0 0  Fall Risk  Category Calculator    0 0  Patient at Risk for Falls Due to  No Fall Risks  No Fall Risks No Fall Risks  Fall risk Follow up  Falls evaluation completed Falls evaluation completed Falls evaluation completed Falls evaluation completed;Falls prevention discussed     Allergies  Allergen Reactions   Gabapentin  Other (See Comments)    Paranoia/hallucinations   Hydrocodone  Other (See Comments)    PT reports  Confusion when tacking Hydrocodone    Nsaids     History of heart disease, CAD with history of MI, CABG status post stent   Prednisone     Paranoia   Tramadol     Penicillins Other (See Comments)    Childhood unsure if still allgeric   Social History   Social History Narrative   Marital status/children/pets: Single   Education/employment: HS grad, 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:      -smoke alarm in the home:Yes     - wears seatbelt: Yes   Lives alone/2025   Past Medical History:  Diagnosis Date   Alcohol addiction (HCC)    Anxiety    Arthritis    Bulging of lumbar intervertebral disc 2017   MRI: L3-4, L4-5 and L5-S1- facet hypertrophy and formainal stenosis   CAD (coronary artery disease)    Coronary artery disease with hx of myocardial infarct w/o hx of CABG 2004   Depression    Dextrocardia    Diabetes (HCC)    Heart attack (HCC) 2004   Stent placement 2008   Hyperlipidemia    Hypertension    Insomnia    Past Surgical History:  Procedure Laterality Date   CORONARY ANGIOPLASTY WITH STENT PLACEMENT  2008   Family History  Problem Relation Age  of Onset   Heart attack Mother    Heart disease Mother    Diabetes Mother    Arthritis Mother    Hyperlipidemia Mother    Hypertension Mother    Breast cancer Mother    Heart attack Father    Heart disease Father    Alcohol abuse Father    Early death Father    Arthritis Sister    Heart disease Sister    Hyperlipidemia Sister    Asthma Sister    COPD Sister    Diabetes Sister    Alcohol abuse Sister    Allergies as of 10/09/2024       Reactions   Gabapentin  Other (See Comments)   Paranoia/hallucinations   Hydrocodone  Other (See Comments)   PT reports  Confusion when tacking Hydrocodone    Nsaids    History of heart disease, CAD with history of MI, CABG status post stent   Prednisone    Paranoia   Tramadol     Penicillins Other (See Comments)   Childhood unsure if still allgeric        Medication List        Accurate as of October 09, 2024  3:18 PM. If you have any questions, ask your nurse or doctor.          STOP taking these medications    clonazePAM  1 MG tablet Commonly known as: KLONOPIN  Stopped by: Charlies Bellini       TAKE these medications    Accu-Chek FastClix Lancets Misc   aspirin  EC 81 MG tablet Take 1 tablet (81 mg total) by mouth daily.   dapagliflozin  propanediol 10 MG Tabs tablet Commonly known as: Farxiga  TAKE 1 TABLET BY MOUTH  DAILY BEFORE BREAKFAST   Dexcom G7  Receiver Devi Monitor glucose levels 3x daily for IDDM   Dexcom G7 Sensor Misc   diazepam  5 MG tablet Commonly known as: VALIUM  Take 1 tablet (5 mg total) by mouth every 6 (six) hours as needed for anxiety. Started by: Charlies Bellini   diclofenac  50 MG EC tablet Commonly known as: VOLTAREN  Take 1 tablet (50 mg total) by mouth 2 (two) times daily. What changed: when to take this   Janumet  XR [phone] MG Tb24 Generic drug: SitaGLIPtin -MetFORMIN  HCl Take 1 tablet by mouth daily.   Lantus  SoloStar 100 UNIT/ML Solostar Pen Generic drug: insulin glargine  Inject 75  Units into the skin daily. What changed: how much to take Changed by: Charlies Bellini   lisinopril  20 MG tablet Commonly known as: ZESTRIL  Take 1 tablet (20 mg total) by mouth daily.   methocarbamol  500 MG tablet Commonly known as: ROBAXIN  Take 1 tablet (500 mg total) by mouth 4 (four) times daily.   omega-3 acid ethyl esters 1 g capsule Commonly known as: LOVAZA  Take 1 capsule (1 g total) by mouth daily.   QUEtiapine  100 MG tablet Commonly known as: SEROQUEL  1/2 tab (50 mg) PO in the day and 1 tab (100 mg) qhs What changed:  how much to take how to take this when to take this additional instructions Changed by: Charlies Bellini   rosuvastatin  40 MG tablet Commonly known as: CRESTOR  Take 1 tablet (40 mg total) by mouth at bedtime.        All past medical history, surgical history, allergies, family history, immunizations andmedications were updated in the EMR today and reviewed under the history and medication portions of their EMR.     ROS: Negative, with the exception of above mentioned in HPI   Objective:  BP 126/76   Pulse (!) 59   Temp 98.3 F (36.8 C)   Wt 218 lb (98.9 kg)   SpO2 98%   BMI 30.40 kg/m  Body mass index is 30.4 kg/m. Physical Exam Vitals and nursing note reviewed.  Constitutional:      General: He is not in acute distress.    Appearance: Normal appearance. He is not ill-appearing, toxic-appearing or diaphoretic.  HENT:     Head: Normocephalic and atraumatic.  Eyes:     General: No scleral icterus.       Right eye: No discharge.        Left eye: No discharge.     Extraocular Movements: Extraocular movements intact.     Pupils: Pupils are equal, round, and reactive to light.  Cardiovascular:     Rate and Rhythm: Normal rate and regular rhythm.     Heart sounds: Murmur heard.  Pulmonary:     Effort: Pulmonary effort is normal. No respiratory distress.     Breath sounds: Normal breath sounds. No wheezing, rhonchi or rales.   Musculoskeletal:     Right lower leg: No edema.     Left lower leg: No edema.  Skin:    General: Skin is warm.     Findings: No rash.  Neurological:     Mental Status: He is alert and oriented to person, place, and time. Mental status is at baseline.  Psychiatric:        Mood and Affect: Mood normal.        Behavior: Behavior normal.        Thought Content: Thought content normal.        Judgment: Judgment normal.     Diabetic Foot Exam -  Simple   Simple Foot Form Diabetic Foot exam was performed with the following findings: Yes 10/09/2024  1:21 PM  Visual Inspection No deformities, no ulcerations, no other skin breakdown bilaterally: Yes Sensation Testing Intact to touch and monofilament testing bilaterally: Yes Pulse Check Posterior Tibialis and Dorsalis pulse intact bilaterally: Yes Comments     No results found. No results found. Results for orders placed or performed in visit on 10/09/24 (from the past 24 hours)  POCT glycosylated hemoglobin (Hb A1C)     Status: Abnormal   Collection Time: 10/09/24  1:26 PM  Result Value Ref Range   Hemoglobin A1C 7.5 (A) 4.0 - 5.6 %   HbA1c POC (<> result, manual entry) 7.5 4.0 - 5.6 %   HbA1c, POC (prediabetic range) 7.5 (A) 5.7 - 6.4 %   HbA1c, POC (controlled diabetic range) 7.5 (A) 0.0 - 7.0 %      Assessment/Plan: [name] is a 68 y.o. male present for OV for Chronic Conditions/illness Management Dyslipidemia associated with type 2 diabetes mellitus (HCC)/diabetic peripheral neuropathy/mild nonproliferative diabetic retinopathy of both eyes/overweight:  Stable Continue janumet  [id number] daily Continue farixga 10 mg daily Increase to  Lantus  70 units> 74u/d PNA series: Completed after 63 Microalbumin UTD 02/27/2024 Flu shot: Declined Foot exam: UTD completed 10/09/2024-also is established at podiatry Eye exam:  Completed UTD 07/13/2022- eye clinic-[facility].  Patient cannot afford screening [facility] and is no longer running  eye clinics. A1c:10.4>>>9.4>>  9.7>>11.8>> 8.5 >>> 6.5 >>>7.1>7.6>7.7>8.7> 8.8 on 04/20/2021>8.3 > 8.7 > 7.4 > 7.6> 8.2 > 6.8> 7.0>9.0> 7.2 > 7.5 >7.5 today   Generalized anxiety disorder (Primary)/Panic disorder/sleep disturbance MUCH improved. No panic attacks since starting new regimen.  Discontinue Klonopin  use and return to Valium  5 mg every 6 hours as needed only.   Start Seroquel  50 mg in a day and continue 100 mg before bed. Meds tried in the past: Trazodone , Valium  BuSpar , Klonopin  Paxil  made him more anxious  -  [place]  controlled substance database reviewed today  Patient aware he needs a face-to-face appointment in order for any refills to be processed -Contract signed -Offered referral to counseling or psychiatry; he declined.    Hypertension/CAD/S/p Stent/prior MI/hypercholesteremia/aortic stenosis/aortic dilatation/LVH/bradycardia/HFrEF:  Pt has a significant cardiac history of anterior MI in 2004 with stents was LAD and then stents to a branch vessel 2008 (by DR. Renaldo- cardio note 2015) Continue lisinopril  to 20 mg.  Continue aspirin  81 mg daily.   Cardiology discontinued Plavix   Continue Crestor  40 mg daily Diet: Low-sodium Exercise: Routine exercise daily - He has reestablished with cardiology and had a Lexiscan  completed today.  Reviewed expectations re: course of current medical issues. Discussed self-management of symptoms. Outlined signs and symptoms indicating need for more acute intervention. Patient verbalized understanding and all questions were answered. Patient received an After-Visit Summary.    Orders Placed This Encounter  Procedures   POCT glycosylated hemoglobin (Hb A1C)    Meds ordered this encounter  Medications   dapagliflozin  propanediol (FARXIGA ) 10 MG TABS tablet    Sig: TAKE 1 TABLET BY MOUTH  DAILY BEFORE BREAKFAST    Dispense:  90 tablet    Refill:  1    Requesting 1 year supply> no   JANUMET  XR [phone] MG TB24     Sig: Take 1 tablet by mouth daily.    Dispense:  90 tablet    Refill:  1   lisinopril  (ZESTRIL ) 20 MG tablet    Sig: Take 1 tablet (20  mg total) by mouth daily.    Dispense:  90 tablet    Refill:  1   QUEtiapine  (SEROQUEL ) 100 MG tablet    Sig: 1/2 tab (50 mg) PO in the day and 1 tab (100 mg) qhs    Dispense:  135 tablet    Refill:  1   rosuvastatin  (CRESTOR ) 40 MG tablet    Sig: Take 1 tablet (40 mg total) by mouth at bedtime.    Dispense:  90 tablet    Refill:  1   insulin glargine  (LANTUS  SOLOSTAR) 100 UNIT/ML Solostar Pen    Sig: Inject 75 Units into the skin daily.    Dispense:  60 mL    Refill:  1    USE THIS SCRIPT PLEASE!!! DC ALL THE other scripts please.   diazepam  (VALIUM ) 5 MG tablet    Sig: Take 1 tablet (5 mg total) by mouth every 6 (six) hours as needed for anxiety.    Dispense:  120 tablet    Refill:  5     Referral Orders  No referral(s) requested today       Note is dictated utilizing voice recognition software. Although note has been proof read prior to signing, occasional typographical errors still can be missed. If any questions arise, please do not hesitate to call for verification.   electronically signed by:  Charlies Bellini, DO  [facility] [place]

## 2024-10-10 LAB — MYOCARDIAL PERFUSION IMAGING
LV dias vol: 143 mL (ref 62–150)
LV sys vol: 81 mL (ref 4.2–5.8)
Nuc Stress EF: 43 %
Peak HR: 65 {beats}/min
Rest HR: 49 {beats}/min
Rest Nuclear Isotope Dose: 10.1 mCi
SDS: 2
SRS: 14
SSS: 15
ST Depression (mm): 0 mm
Stress Nuclear Isotope Dose: 31.8 mCi
TID: 1.1

## 2024-10-13 ENCOUNTER — Ambulatory Visit: Payer: Self-pay | Admitting: Internal Medicine

## 2024-10-15 ENCOUNTER — Telehealth: Payer: Self-pay | Admitting: Physical Medicine and Rehabilitation

## 2024-10-15 NOTE — Progress Notes (Signed)
 Pharmacy Quality Measure Review  This patient is appearing on a report for being at risk of failing the adherence measure for cholesterol (statin) medications this calendar year.   Medication: rosuvastatin  40 mg Last fill date: 10/01/24 for 90 day supply  Insurance report was not up to date. No action needed at this time. Refills remaining on prescription.  Woodie Jock, PharmD PGY1 Pharmacy Resident  10/15/2024

## 2024-10-15 NOTE — Telephone Encounter (Signed)
 Pt called requesting an appt for back injection with [facility]. Please call pt at [phone]

## 2024-10-17 ENCOUNTER — Other Ambulatory Visit: Payer: Self-pay | Admitting: Family Medicine

## 2024-10-21 ENCOUNTER — Telehealth: Payer: Self-pay | Admitting: Physical Medicine and Rehabilitation

## 2024-10-21 MED ORDER — METHOCARBAMOL 500 MG PO TABS: 500.0000 mg | ORAL_TABLET | Freq: Four times a day (QID) | ORAL | 2 refills | Status: DC | PRN

## 2024-10-21 MED ORDER — DICLOFENAC SODIUM 50 MG PO TBEC: 50.0000 mg | DELAYED_RELEASE_TABLET | Freq: Two times a day (BID) | ORAL | 2 refills | Status: AC

## 2024-10-21 NOTE — Telephone Encounter (Signed)
 Pt called wanting to know if he could get a refill of the muscle relaxer and Viclofenac sodec 50 mg tab. [facility]acy is [facility]. Call back number is [phone]

## 2024-10-29 ENCOUNTER — Encounter: Payer: Self-pay | Admitting: *Deleted

## 2024-10-29 ENCOUNTER — Telehealth: Payer: Self-pay | Admitting: Internal Medicine

## 2024-10-29 NOTE — Telephone Encounter (Signed)
 I was able to call the patient and let him know of his stress test and echo results. Per Dr. Kriste, Your stress test showed evidence of your prior heart attack but did not show any new findings to suggest worsening coronary artery disease that could be causing your shortness of breath. Also, your recent echo showed a calcified but stable aortic valve when compared to the prior echo from 2022. All in all, this means there are no new findings. That being said, your shortness of breath is not likely coming from your heart. I would recommend diet, exercise and weight loss to improve your symptoms and continuing your current medications. A heart healthy diet to consider would either be a plant based diet or the mediterranean diet.. The patient was receptive and understood the results/recommendations.  He did state he needed to reschedule his appointment with us , which I was able to do for him. Dr. Kriste is aware and we will see him for his follow up on 12/04/24.

## 2024-10-29 NOTE — Telephone Encounter (Signed)
 Patient is requesting a call back to review stress test results.

## 2024-10-30 ENCOUNTER — Ambulatory Visit: Admitting: Internal Medicine

## 2024-11-05 ENCOUNTER — Ambulatory Visit: Admitting: Physical Medicine and Rehabilitation

## 2024-11-25 ENCOUNTER — Telehealth: Payer: Self-pay

## 2024-11-25 ENCOUNTER — Other Ambulatory Visit: Payer: Self-pay | Admitting: Family Medicine

## 2024-11-25 MED ORDER — LANTUS SOLOSTAR 100 UNIT/ML ~~LOC~~ SOPN: 75.0000 [IU] | PEN_INJECTOR | Freq: Every day | SUBCUTANEOUS | 1 refills | Status: DC

## 2024-11-25 NOTE — Telephone Encounter (Signed)
 Communication  Reason for CRM: insulin glargine  (LANTUS  SOLOSTAR) 100 UNIT/ML Solostar Pen [[id number]]  DISCONTINUED, pt called he was denied the pen, but per notes it was discontinued , pt only have 3 pens left, he needs pens to take  medication.   Rx sent.

## 2024-11-26 ENCOUNTER — Other Ambulatory Visit: Payer: Self-pay | Admitting: Family Medicine

## 2024-11-26 MED ORDER — LANTUS SOLOSTAR 100 UNIT/ML ~~LOC~~ SOPN: 75.0000 [IU] | PEN_INJECTOR | Freq: Every day | SUBCUTANEOUS | 1 refills | Status: AC

## 2024-11-26 NOTE — Telephone Encounter (Signed)
 Communication  Reason for CRM: The patient's insulin glargine  was not received by his pharmacy. They are needing the medication either recent electronically or faxed. The patient is needing this fixed as soon as possible as he is now almost out of medication.        [facility] [place], [place] - [address]    [address] [place], [place] [address]    Phone: [phone]  Fax: [phone]      (Please call patient once this is resent at [phone])   Rx re-sent. Pt aware and verbalized understanding.

## 2024-12-02 ENCOUNTER — Encounter: Payer: Self-pay | Admitting: Physical Medicine and Rehabilitation

## 2024-12-02 ENCOUNTER — Ambulatory Visit: Admitting: Physical Medicine and Rehabilitation

## 2024-12-02 DIAGNOSIS — G8929 Other chronic pain: Secondary | ICD-10-CM | POA: Diagnosis not present

## 2024-12-02 DIAGNOSIS — M47819 Spondylosis without myelopathy or radiculopathy, site unspecified: Secondary | ICD-10-CM | POA: Diagnosis not present

## 2024-12-02 DIAGNOSIS — M545 Low back pain, unspecified: Secondary | ICD-10-CM | POA: Diagnosis not present

## 2024-12-02 DIAGNOSIS — M47816 Spondylosis without myelopathy or radiculopathy, lumbar region: Secondary | ICD-10-CM | POA: Diagnosis not present
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
## 2024-12-02 NOTE — Progress Notes (Signed)
 Pain Scale   Average Pain 10 Patient advising he has lower back pain when walking standing and doing household chores. Sitting decreases his pain.        +Driver, -BT, -Dye Allergies.

## 2024-12-04 ENCOUNTER — Ambulatory Visit: Admitting: Internal Medicine

## 2024-12-04 NOTE — Progress Notes (Deleted)
" °  Cardiology Office Note:  .   Date:  12/04/2024  ID:  [name], DOB [birth year], MRN [id number] PCP: Catherine Charlies LABOR, DO  [facility] Providers Cardiologist:  Emeline FORBES Calender, DO { Click to update primary MD,subspecialty MD or APP then REFRESH:1}   History of Present Illness: .    [name] is a 69 y.o. male with a past medical history of diabetes, depression and anxiety, hypertension, mild aortic stenosis, HFmrEF, CAD and MI in 2004 s/p PCI to the LAD and possibly to branch vessel in 2008 who presents today for follow-up.  Discussed the use of AI scribe software for clinical note transcription with the patient, who gave verbal consent to proceed.  History of Present Illness       ROS: Remaining review of systems negative  Studies Reviewed: .        Results  Risk Assessment/Calculations:     No BP recorded.  {Refresh Note OR Click here to enter BP  :1}***       Physical Exam:   VS:  There were no vitals taken for this visit.   Wt Readings from Last 3 Encounters:  10/09/24 216 lb (98 kg)  10/09/24 218 lb (98.9 kg)  10/02/24 220 lb (99.8 kg)    GEN: Well nourished, well developed in no acute distress NECK: No JVD; No carotid bruits CARDIAC: ***RRR, no murmurs, no rubs, no gallops RESPIRATORY:  Clear to auscultation without rales, wheezing or rhonchi  ABDOMEN: Soft, non-tender, non-distended EXTREMITIES:  No edema; No deformity   ASSESSMENT AND PLAN: .    Assessment and Plan Assessment & Plan        {Are you ordering a CV Procedure (e.g. stress test, cath, DCCV, TEE, etc)?   Press F2        :[id number]}    Follow up: ***  Signed, Emeline FORBES Calender, DO  12/04/2024 1:04 PM    [facility] "

## 2024-12-06 ENCOUNTER — Telehealth: Payer: Self-pay | Admitting: Physical Medicine and Rehabilitation

## 2024-12-06 NOTE — Telephone Encounter (Signed)
 Pt called and was very rude saying someone called him at 12:15. He didn't know who it was and upon looking in his chart I didn't see where anyone had called him. I told him this and he insisted that they did. He told me it was about insurance approval for injections and then asked if that was it and hung up. Call back number is [phone]. I hope this is a good number since he hung up on me.

## 2024-12-19 ENCOUNTER — Ambulatory Visit: Admitting: Physical Medicine and Rehabilitation

## 2024-12-19 ENCOUNTER — Other Ambulatory Visit: Payer: Self-pay

## 2024-12-19 VITALS — BP 153/77

## 2024-12-19 DIAGNOSIS — M47819 Spondylosis without myelopathy or radiculopathy, site unspecified: Secondary | ICD-10-CM | POA: Diagnosis not present

## 2024-12-19 MED ORDER — METHYLPREDNISOLONE ACETATE 40 MG/ML IJ SUSP
40.0000 mg | Freq: Once | INTRAMUSCULAR | Status: AC
  Administered 2024-12-19: 40 mg

## 2024-12-19 NOTE — Progress Notes (Signed)
 Pain Scale   Average Pain 8 Patient advising he has lower back pain that is worse in am, however his pain is constant.        +Driver, -BT, -Dye Allergies.

## 2024-12-24 NOTE — Procedures (Signed)
 Lumbar Facet Joint Intra-Articular Injection(s) with Fluoroscopic Guidance  Patient: [name]      Date of Birth: [birth year] MRN: [id number] PCP: Catherine Charlies LABOR, DO      Visit Date: 12/19/2024   Universal Protocol:    Date/Time: 12/19/2024  Consent Given By: the patient  Position: PRONE   Additional Comments: Vital signs were monitored before and after the procedure. Patient was prepped and draped in the usual sterile fashion. The correct patient, procedure, and site was verified.   Injection Procedure Details:  Procedure Site One Meds Administered:  Meds ordered this encounter  Medications   methylPREDNISolone  acetate (DEPO-MEDROL ) injection 40 mg     Laterality: Bilateral  Location/Site:  L4-L5  Needle size: 22 guage  Needle type: Spinal  Needle Placement: Articular  Findings:  -Comments: Excellent flow of contrast producing a partial arthrogram.  Procedure Details: The fluoroscope beam is vertically oriented in AP, and the inferior recess is visualized beneath the lower pole of the inferior apophyseal process, which represents the target point for needle insertion. When direct visualization is difficult the target point is located at the medial projection of the vertebral pedicle. The region overlying each aforementioned target is locally anesthetized with a 1 to 2 ml. volume of 1% Lidocaine  without Epinephrine.   The spinal needle was inserted into each of the above mentioned facet joints using biplanar fluoroscopic guidance. A 0.25 to 0.5 ml. volume of Isovue -250 was injected and a partial facet joint arthrogram was obtained. A single spot film was obtained of the resulting arthrogram.    One to 1.25 ml of the steroid/anesthetic solution was then injected into each of the facet joints noted above.   Additional Comments:  The patient tolerated the procedure well Dressing: 2 x 2 sterile gauze and Band-Aid    Post-procedure details: Patient was observed  during the procedure. Post-procedure instructions were reviewed.  Patient left the clinic in stable condition.

## 2024-12-24 NOTE — Progress Notes (Signed)
 "  [name] - 69 y.o. male MRN [id number]  Date of birth: May 18, 1956  Office Visit Note: Visit Date: 12/19/2024 PCP: Catherine Charlies LABOR, DO Referred by: Catherine Charlies LABOR, DO  Subjective: Chief Complaint  Patient presents with   Lower Back - Pain   HPI:  [name] is a 69 y.o. male who comes in today at the request of Duwaine Pouch, FNP for planned Bilateral  L4-5 Lumbar facet/medial branch block with fluoroscopic guidance.  The patient has failed conservative care including home exercise, medications, time and activity modification.  This injection will be diagnostic and hopefully therapeutic.  Please see requesting physician notes for further details and justification.  Exam has shown concordant pain with facet joint loading.   ROS Otherwise per HPI.  Assessment & Plan: Visit Diagnoses:    ICD-10-CM   1. Spondylosis without myelopathy or radiculopathy  M47.819 XR C-ARM NO REPORT    Facet Injection    methylPREDNISolone  acetate (DEPO-MEDROL ) injection 40 mg      Plan: No additional findings.   Meds & Orders:  Meds ordered this encounter  Medications   methylPREDNISolone  acetate (DEPO-MEDROL ) injection 40 mg    Orders Placed This Encounter  Procedures   Facet Injection   XR C-ARM NO REPORT    Follow-up: Return for visit to requesting provider as needed.   Procedures: No procedures performed  Lumbar Facet Joint Intra-Articular Injection(s) with Fluoroscopic Guidance  Patient: [name]      Date of Birth: [birth year] MRN: [id number] PCP: Catherine Charlies LABOR, DO      Visit Date: 12/19/2024   Universal Protocol:    Date/Time: 12/19/2024  Consent Given By: the patient  Position: PRONE   Additional Comments: Vital signs were monitored before and after the procedure. Patient was prepped and draped in the usual sterile fashion. The correct patient, procedure, and site was verified.   Injection Procedure Details:  Procedure Site One Meds Administered:   Meds ordered this encounter  Medications   methylPREDNISolone  acetate (DEPO-MEDROL ) injection 40 mg     Laterality: Bilateral  Location/Site:  L4-L5  Needle size: 22 guage  Needle type: Spinal  Needle Placement: Articular  Findings:  -Comments: Excellent flow of contrast producing a partial arthrogram.  Procedure Details: The fluoroscope beam is vertically oriented in AP, and the inferior recess is visualized beneath the lower pole of the inferior apophyseal process, which represents the target point for needle insertion. When direct visualization is difficult the target point is located at the medial projection of the vertebral pedicle. The region overlying each aforementioned target is locally anesthetized with a 1 to 2 ml. volume of 1% Lidocaine  without Epinephrine.   The spinal needle was inserted into each of the above mentioned facet joints using biplanar fluoroscopic guidance. A 0.25 to 0.5 ml. volume of Isovue -250 was injected and a partial facet joint arthrogram was obtained. A single spot film was obtained of the resulting arthrogram.    One to 1.25 ml of the steroid/anesthetic solution was then injected into each of the facet joints noted above.   Additional Comments:  The patient tolerated the procedure well Dressing: 2 x 2 sterile gauze and Band-Aid    Post-procedure details: Patient was observed during the procedure. Post-procedure instructions were reviewed.  Patient left the clinic in stable condition.    Clinical History: MRI LUMBAR SPINE WITHOUT CONTRAST   TECHNIQUE: Multiplanar, multisequence MR imaging of the lumbar spine was performed. No intravenous contrast was administered.  COMPARISON:  MRI lumbar spine dated February 11, 2016.   FINDINGS: Segmentation:  Standard.   Alignment:  Mild retrolisthesis of L5.   Vertebrae:  No fracture, evidence of discitis, or bone lesion.   Conus medullaris and cauda equina: Conus extends to the L1  level. Conus and cauda equina appear normal.   Paraspinal and other soft tissues: Negative.   Disc levels:   T12-L1: No significant disc bulge. No neural foraminal stenosis. No central canal stenosis.   L1-L2: No significant disc bulge. No neural foraminal stenosis. No central canal stenosis.   L2-L3: Mild disc bulge without significant spinal canal or neural foraminal stenosis.   L3-L4: Broad-based disc protrusion and ligamentum flavum hypertrophy with mild narrowing of bilateral lateral recesses. No significant neural foraminal stenosis.   L4-L5: Broad-based disc protrusion and ligamentum flavum hypertrophy with narrowing of bilateral lateral recesses. Mild bilateral neural foraminal stenosis, right greater than the left.   L5-S1: No significant disc bulge. No neural foraminal stenosis. No central canal stenosis. Mild facet joint arthropathy.   IMPRESSION: 1.  No evidence of acute fracture   2. Degenerate disc disease of the lumbar spine prominent at L3-L4 and L4-L5 with narrowing of bilateral recesses. Bilateral neural foraminal stenosis at L4-L5, right greater than the left.     Electronically Signed   By: Imran  Ahmed D.O.   On: 03/15/2022 17:27     Objective:  VS:  HT:    WT:   BMI:     BP:(!) 153/77  HR: bpm  TEMP: ( )  RESP:  Physical Exam Vitals and nursing note reviewed.  Constitutional:      General: He is not in acute distress.    Appearance: Normal appearance. He is not ill-appearing.  HENT:     Head: Normocephalic and atraumatic.     Right Ear: External ear normal.     Left Ear: External ear normal.     Nose: No congestion.  Eyes:     Extraocular Movements: Extraocular movements intact.  Cardiovascular:     Rate and Rhythm: Normal rate.     Pulses: Normal pulses.  Pulmonary:     Effort: Pulmonary effort is normal. No respiratory distress.  Abdominal:     General: There is no distension.     Palpations: Abdomen is soft.   Musculoskeletal:        General: No tenderness or signs of injury.     Cervical back: Neck supple.     Right lower leg: No edema.     Left lower leg: No edema.     Comments: Patient has good distal strength without clonus.  Skin:    Findings: No erythema or rash.  Neurological:     General: No focal deficit present.     Mental Status: He is alert and oriented to person, place, and time.     Sensory: No sensory deficit.     Motor: No weakness or abnormal muscle tone.     Coordination: Coordination normal.  Psychiatric:        Mood and Affect: Mood normal.        Behavior: Behavior normal.      Imaging: No results found. "

## 2024-12-27 ENCOUNTER — Telehealth: Payer: Self-pay | Admitting: Physical Medicine and Rehabilitation

## 2024-12-27 DIAGNOSIS — M47819 Spondylosis without myelopathy or radiculopathy, site unspecified: Secondary | ICD-10-CM

## 2024-12-27 DIAGNOSIS — M545 Low back pain, unspecified: Secondary | ICD-10-CM

## 2024-12-27 MED ORDER — METHOCARBAMOL 500 MG PO TABS: 500.0000 mg | ORAL_TABLET | Freq: Four times a day (QID) | ORAL | 2 refills | Status: AC | PRN

## 2024-12-30 ENCOUNTER — Telehealth: Payer: Self-pay

## 2024-12-30 NOTE — Telephone Encounter (Signed)
 Pt is not in need of the refill at this time

## 2024-12-30 NOTE — Telephone Encounter (Signed)
 Copied from CRM #[id number]. Topic: General - Other >> Dec 27, 2024  1:37 PM Rea ORN wrote: Reason for CRM: Geni with US  med called to see if clinic received the fax from 12/18/24 for continuous glucose monitor. I advised that I do not see anything scanned in the pt chart and to fax again.   If there are any additional questions, please call back  [phone]

## 2025-01-01 ENCOUNTER — Telehealth: Payer: Self-pay

## 2025-01-01 NOTE — Telephone Encounter (Signed)
 Patient stated that injection on 1/22 didn't help at all. He is having the same pain. No injuries. The muscle relaxer did not help. Scheduled a post injection f/u 2/11 at 3:30

## 2025-01-08 ENCOUNTER — Ambulatory Visit: Admitting: Physical Medicine and Rehabilitation

## 2025-01-22 ENCOUNTER — Ambulatory Visit: Admitting: Family Medicine
